# Patient Record
Sex: Male | Born: 1945 | Race: White | Hispanic: No | Marital: Married | Smoking: Never smoker
Health system: Southern US, Community
[De-identification: ages and names within clinical notes are randomized; demographics above are authoritative.]

## PROBLEM LIST (undated history)

## (undated) DIAGNOSIS — E119 Type 2 diabetes mellitus without complications: Secondary | ICD-10-CM

## (undated) DIAGNOSIS — A419 Sepsis, unspecified organism: Secondary | ICD-10-CM

## (undated) DIAGNOSIS — C801 Malignant (primary) neoplasm, unspecified: Secondary | ICD-10-CM

## (undated) DIAGNOSIS — Z8619 Personal history of other infectious and parasitic diseases: Secondary | ICD-10-CM

## (undated) DIAGNOSIS — M109 Gout, unspecified: Secondary | ICD-10-CM

## (undated) DIAGNOSIS — K219 Gastro-esophageal reflux disease without esophagitis: Secondary | ICD-10-CM

## (undated) DIAGNOSIS — I5032 Chronic diastolic (congestive) heart failure: Secondary | ICD-10-CM

## (undated) DIAGNOSIS — J342 Deviated nasal septum: Secondary | ICD-10-CM

## (undated) DIAGNOSIS — R7303 Prediabetes: Secondary | ICD-10-CM

## (undated) DIAGNOSIS — M9110 Juvenile osteochondrosis of head of femur [Legg-Calve-Perthes], unspecified leg: Secondary | ICD-10-CM

## (undated) DIAGNOSIS — E785 Hyperlipidemia, unspecified: Secondary | ICD-10-CM

## (undated) DIAGNOSIS — I10 Essential (primary) hypertension: Secondary | ICD-10-CM

## (undated) DIAGNOSIS — H6120 Impacted cerumen, unspecified ear: Secondary | ICD-10-CM

## (undated) HISTORY — PX: TONSILLECTOMY: SUR1361

## (undated) HISTORY — DX: Impacted cerumen, unspecified ear: H61.20

## (undated) HISTORY — PX: WISDOM TOOTH EXTRACTION: SHX21

## (undated) HISTORY — PX: HERNIA REPAIR: SHX51

## (undated) HISTORY — DX: Prediabetes: R73.03

## (undated) HISTORY — DX: Gout, unspecified: M10.9

## (undated) HISTORY — DX: Essential (primary) hypertension: I10

## (undated) HISTORY — DX: Deviated nasal septum: J34.2

## (undated) HISTORY — DX: Hyperlipidemia, unspecified: E78.5

## (undated) HISTORY — DX: Personal history of other infectious and parasitic diseases: Z86.19

## (undated) HISTORY — DX: Gastro-esophageal reflux disease without esophagitis: K21.9

## (undated) HISTORY — DX: Juvenile osteochondrosis of head of femur (Legg-Calve-Perthes), unspecified leg: M91.10

## (undated) HISTORY — DX: Chronic diastolic (congestive) heart failure: I50.32

## (undated) HISTORY — PX: VASECTOMY: SHX75

## (undated) HISTORY — DX: Type 2 diabetes mellitus without complications: E11.9

## (undated) HISTORY — DX: Sepsis, unspecified organism: A41.9

---

## 2004-12-19 ENCOUNTER — Ambulatory Visit: Payer: Self-pay | Admitting: Internal Medicine

## 2005-04-03 ENCOUNTER — Ambulatory Visit: Payer: Self-pay | Admitting: Internal Medicine

## 2005-04-07 ENCOUNTER — Ambulatory Visit: Payer: Self-pay | Admitting: Internal Medicine

## 2005-05-17 ENCOUNTER — Ambulatory Visit: Payer: Self-pay | Admitting: Internal Medicine

## 2005-06-08 ENCOUNTER — Encounter: Admission: RE | Admit: 2005-06-08 | Discharge: 2005-09-06 | Payer: Self-pay | Admitting: Internal Medicine

## 2006-01-19 ENCOUNTER — Ambulatory Visit: Payer: Self-pay | Admitting: Family Medicine

## 2007-01-28 ENCOUNTER — Ambulatory Visit: Payer: Self-pay | Admitting: Internal Medicine

## 2007-10-03 ENCOUNTER — Ambulatory Visit: Payer: Self-pay | Admitting: Internal Medicine

## 2007-10-04 ENCOUNTER — Ambulatory Visit: Payer: Self-pay | Admitting: Internal Medicine

## 2007-10-08 ENCOUNTER — Encounter (INDEPENDENT_AMBULATORY_CARE_PROVIDER_SITE_OTHER): Payer: Self-pay | Admitting: *Deleted

## 2007-12-09 ENCOUNTER — Telehealth (INDEPENDENT_AMBULATORY_CARE_PROVIDER_SITE_OTHER): Payer: Self-pay | Admitting: *Deleted

## 2007-12-09 DIAGNOSIS — R930 Abnormal findings on diagnostic imaging of skull and head, not elsewhere classified: Secondary | ICD-10-CM | POA: Insufficient documentation

## 2007-12-16 ENCOUNTER — Ambulatory Visit: Payer: Self-pay | Admitting: Cardiology

## 2007-12-18 ENCOUNTER — Encounter (INDEPENDENT_AMBULATORY_CARE_PROVIDER_SITE_OTHER): Payer: Self-pay | Admitting: *Deleted

## 2008-03-09 ENCOUNTER — Telehealth (INDEPENDENT_AMBULATORY_CARE_PROVIDER_SITE_OTHER): Payer: Self-pay | Admitting: *Deleted

## 2008-06-22 ENCOUNTER — Telehealth (INDEPENDENT_AMBULATORY_CARE_PROVIDER_SITE_OTHER): Payer: Self-pay | Admitting: *Deleted

## 2008-06-23 ENCOUNTER — Ambulatory Visit: Payer: Self-pay | Admitting: Internal Medicine

## 2008-06-23 DIAGNOSIS — I10 Essential (primary) hypertension: Secondary | ICD-10-CM

## 2008-06-23 HISTORY — DX: Essential (primary) hypertension: I10

## 2008-07-03 ENCOUNTER — Ambulatory Visit: Payer: Self-pay | Admitting: Internal Medicine

## 2008-07-14 ENCOUNTER — Telehealth (INDEPENDENT_AMBULATORY_CARE_PROVIDER_SITE_OTHER): Payer: Self-pay | Admitting: *Deleted

## 2008-07-14 LAB — CONVERTED CEMR LAB
Bilirubin, Direct: 0.1 mg/dL (ref 0.0–0.3)
CO2: 28 meq/L (ref 19–32)
Calcium: 9.4 mg/dL (ref 8.4–10.5)
Creatinine, Ser: 1 mg/dL (ref 0.4–1.5)
Direct LDL: 160 mg/dL
GFR calc non Af Amer: 80 mL/min
Glucose, Bld: 112 mg/dL — ABNORMAL HIGH (ref 70–99)
Hgb A1c MFr Bld: 6.1 % — ABNORMAL HIGH (ref 4.6–6.0)
TSH: 1.74 microintl units/mL (ref 0.35–5.50)
Triglycerides: 242 mg/dL (ref 0–149)

## 2009-03-16 ENCOUNTER — Telehealth (INDEPENDENT_AMBULATORY_CARE_PROVIDER_SITE_OTHER): Payer: Self-pay | Admitting: *Deleted

## 2009-03-17 ENCOUNTER — Ambulatory Visit: Payer: Self-pay | Admitting: Internal Medicine

## 2009-03-17 LAB — CONVERTED CEMR LAB
Albumin: 3.8 g/dL (ref 3.5–5.2)
Alkaline Phosphatase: 67 units/L (ref 39–117)
Cholesterol: 249 mg/dL — ABNORMAL HIGH (ref 0–200)
HDL: 39.3 mg/dL (ref >39.00)
Hgb A1c MFr Bld: 6.1 % (ref 4.6–6.5)
Total Bilirubin: 0.9 mg/dL (ref 0.3–1.2)
Total CHOL/HDL Ratio: 6
Total Protein: 7 g/dL (ref 6.0–8.3)
Triglycerides: 259 mg/dL — ABNORMAL HIGH (ref 0.0–149.0)

## 2009-03-22 ENCOUNTER — Ambulatory Visit: Payer: Self-pay | Admitting: Internal Medicine

## 2009-03-22 DIAGNOSIS — R7303 Prediabetes: Secondary | ICD-10-CM

## 2009-03-22 DIAGNOSIS — E1169 Type 2 diabetes mellitus with other specified complication: Secondary | ICD-10-CM | POA: Insufficient documentation

## 2009-03-22 HISTORY — DX: Prediabetes: R73.03

## 2009-03-23 ENCOUNTER — Encounter: Payer: Self-pay | Admitting: Internal Medicine

## 2009-07-15 ENCOUNTER — Ambulatory Visit: Payer: Self-pay | Admitting: Internal Medicine

## 2009-07-15 DIAGNOSIS — E785 Hyperlipidemia, unspecified: Secondary | ICD-10-CM | POA: Insufficient documentation

## 2009-07-15 DIAGNOSIS — M109 Gout, unspecified: Secondary | ICD-10-CM | POA: Insufficient documentation

## 2009-07-15 DIAGNOSIS — E1169 Type 2 diabetes mellitus with other specified complication: Secondary | ICD-10-CM

## 2009-07-15 HISTORY — DX: Type 2 diabetes mellitus with other specified complication: E11.69

## 2009-07-17 ENCOUNTER — Emergency Department (HOSPITAL_COMMUNITY): Admission: EM | Admit: 2009-07-17 | Discharge: 2009-07-17 | Payer: Self-pay | Admitting: Emergency Medicine

## 2009-07-17 ENCOUNTER — Emergency Department (HOSPITAL_COMMUNITY): Admission: EM | Admit: 2009-07-17 | Discharge: 2009-07-18 | Payer: Self-pay | Admitting: Emergency Medicine

## 2009-12-26 ENCOUNTER — Emergency Department (HOSPITAL_COMMUNITY): Admission: EM | Admit: 2009-12-26 | Discharge: 2009-12-26 | Payer: Self-pay | Admitting: Family Medicine

## 2010-01-04 ENCOUNTER — Ambulatory Visit: Payer: Self-pay | Admitting: Internal Medicine

## 2010-01-13 ENCOUNTER — Encounter: Payer: Self-pay | Admitting: Internal Medicine

## 2010-03-04 ENCOUNTER — Ambulatory Visit (HOSPITAL_COMMUNITY): Admission: RE | Admit: 2010-03-04 | Discharge: 2010-03-04 | Payer: Self-pay | Admitting: Surgery

## 2010-03-24 ENCOUNTER — Encounter: Payer: Self-pay | Admitting: Internal Medicine

## 2010-11-22 ENCOUNTER — Encounter: Payer: Self-pay | Admitting: Gastroenterology

## 2011-01-11 NOTE — Consult Note (Signed)
Summary: [facility]   Imported By: Lanelle Bal 02/17/2010 10:49:46  _____________________________________________________________________  External Attachment:    Type:   Image     Comment:   External Document

## 2011-01-11 NOTE — Letter (Signed)
Summary: [facility]   Imported By: Lanelle Bal 04/13/2010 10:23:04  _____________________________________________________________________  External Attachment:    Type:   Image     Comment:   External Document

## 2011-01-11 NOTE — Assessment & Plan Note (Signed)
Summary: DISCUSS HERNIA/KDC   Vital Signs:  Patient profile:   65 year old male Weight:      210.4 pounds BMI:     29.66 Temp:     98.6 degrees F oral Pulse rate:   80 / minute Resp:     15 per minute BP sitting:   128 / 76  (left arm) Cuff size:   large  Vitals Entered By: Shonna Chock (January 04, 2010 11:44 AM) CC: Discuss Hernia Flare-up, ? due to cough  Comments REVIEWED MED LIST, PATIENT AGREED DOSE AND INSTRUCTION CORRECT    CC:  Discuss Hernia Flare-up and ? due to cough .  History of Present Illness: Acute onset of L  inguinal pain 12/31/2009 after pushing tractor into shed. Pain improved w/o treatment but " movement" with cough. No sputum or fever  now ; S/P antibiotics from [facility]. He wants to pursue elvation "before [employer]"  Allergies (verified): No Known Drug Allergies  Review of Systems General:  Denies chills, fever, sweats, and weight loss. ENT:  Denies nasal congestion and sinus pressure; No purulence. Resp:  Complains of cough; denies shortness of breath, sputum productive, and wheezing. GI:  Denies abdominal pain, bloody stools, and dark tarry stools. GU:  Denies discharge, dysuria, and hematuria.  Physical Exam  General:  well-nourished,in no acute distress; alert,appropriate and cooperative throughout examination Abdomen:  Weakness R inguinal canal with cough. Reducible direct hernia on L. Genitalia:  Testes bilaterally descended without nodularity, tenderness or masses. Scrotal masses from vasectomy scar tissue. No penis lesions or urethral discharge. Skin:  Intact without suspicious lesions or rashes Inguinal Nodes:  No significant adenopathy   Impression & Recommendations:  Problem # 1:  INGUINAL HERNIA, LEFT (ICD-550.90)  Orders: Surgical Referral (Surgery)  Complete Medication List: 1)  Metoprolol Succinate 25 Mg Tb24 (Metoprolol succinate) .Marland Kitchen.. 1 by mouth qd 2)  Lisinopril 5 Mg Tabs (Lisinopril) .Marland Kitchen.. 1 by mouth once daily-needs  to schedule ov 3)  Aspirin Adult Low Strength 81 Mg Tbec (Aspirin) .Marland Kitchen.. 1 by mouth once daily 4)  Pepcid Complete 10-800-165 Mg Chew (Famotidine-ca carb-mag hydrox)  Patient Instructions: 1)  Nolifting > 15 # until seen

## 2011-01-12 NOTE — Letter (Signed)
Summary: Colonoscopy Letter  [facility]  [address]   [place], [place] [address]   Phone: [phone]  Fax: [phone]      November 22, 2010 MRN: [id number]   Devin Huffman [address] [place], [place]  [address]   Dear [name],   According to your medical record, it is time for you to schedule a Colonoscopy. The American Cancer Society recommends this procedure as a method to detect early colon cancer. Patients with a family history of colon cancer, or a personal history of colon polyps or inflammatory bowel disease are at increased risk.  This letter has been generated based on the recommendations made at the time of your procedure. If you feel that in your particular situation this may no longer apply, please contact our office.  Please call our office at [phone] to schedule this appointment or to update your records at your earliest convenience.  Thank you for cooperating with [place] to provide you with the very best care possible.   Sincerely,   Claudette Head, M.D.  [facility] [phone]

## 2011-01-24 ENCOUNTER — Encounter (INDEPENDENT_AMBULATORY_CARE_PROVIDER_SITE_OTHER): Payer: Commercial Managed Care - PPO | Admitting: Family Medicine

## 2011-01-24 ENCOUNTER — Encounter: Payer: Self-pay | Admitting: Family Medicine

## 2011-01-24 DIAGNOSIS — E782 Mixed hyperlipidemia: Secondary | ICD-10-CM

## 2011-01-24 DIAGNOSIS — I10 Essential (primary) hypertension: Secondary | ICD-10-CM

## 2011-01-24 DIAGNOSIS — R7309 Other abnormal glucose: Secondary | ICD-10-CM

## 2011-02-01 NOTE — Assessment & Plan Note (Signed)
Summary: new pt to est/cle   Vital Signs:  Patient profile:   65 year old male Height:      70.75 inches Weight:      214 pounds BMI:     30.17 Temp:     98.6 degrees F oral Pulse rate:   80 / minute Pulse rhythm:   regular BP sitting:   108 / 72  (left arm) Cuff size:   large  Vitals Entered By: Selena Batten Dance CMA Duncan Dull) (January 24, 2011 1:58 PM) CC: New patient to establish care/transfer from Dr. Alwyn Ren   History of Present Illness: CC: transfer from Dr. Alwyn Ren  Used to see Dr. Alwyn Ren, but now retired and lives nearby.  1. HTN - no HA, vision changes, chest pain, tightness, urinary changes, LE swelling.  Compliant with meds.  Did feel a bit dizzy this am.  BP today 108/72.  About to run out of meds.  2. GERD - controlling with pepcid.  tries to stay away from spicy/greasy foods.  h/o HLD in past.  not fasting today.  preventative: colonscopy - Dr. Russella Dar.  last done 2002, WNL.  due for rpt.  would like rpt colonsocopy, declines iFOB.  no blood in stool, no changes in BMs.  No family hx colon CA. prostate - last normal 2009.   tetanus 2010. no flu, no pneumonia.  Current Medications (verified): 1)  Metoprolol Succinate 25 Mg  Tb24 (Metoprolol Succinate) .Marland Kitchen.. 1 By Mouth Once Daily 2)  Lisinopril 5 Mg  Tabs (Lisinopril) .Marland Kitchen.. 1 By Mouth Once Daily 3)  Aspirin Adult Low Strength 81 Mg  Tbec (Aspirin) .Marland Kitchen.. 1 By Mouth Once Daily 4)  Pepcid Complete 10-800-165 Mg  Chew (Famotidine-Ca Carb-Mag Hydrox)  Allergies (verified): No Known Drug Allergies  Past History:  Social History: Last updated: 01/24/2011 No smoking, no EtOH, no rec drugs Caffeine: few cups/day No diet ; "I love my ice cream. 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 neg M aunt CA ? primary M uncle HTN P uncle CAD/MI niece (24yo) with 2 types of cancer  no FH DM or lipid disorder, CAD/MI, CVA  Social History: No smoking, no EtOH, no rec drugs Caffeine: few cups/day No diet ; "I love my ice cream. 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    Physical Exam  General:  well-nourished,in no acute distress; alert,appropriate and cooperative throughout examination Head:  Normocephalic and atraumatic without obvious abnormalities. No apparent alopecia or balding. Eyes:  PERRLA, EOMI, no injection Ears:  TMs clear bilaterally Nose:  nares clear bilaterally Mouth:  Oral mucosa and oropharynx without lesions or exudates.  Teeth in good repair. Neck:  No deformities,  masses, or tenderness noted.  no LAD, no carotid bruits Lungs:  Normal respiratory effort, chest expands symmetrically. Lungs are clear to auscultation, no crackles or wheezes. Heart:  Normal rate and regular rhythm. S1 and S2 normal without gallop, murmur, click, rub or other extra sounds. Abdomen:  Bowel sounds positive,abdomen soft and non-tender without masses, organomegaly or hernias noted. Msk:  No deformity or scoliosis noted of thoracic or lumbar  spine.   Pulses:  2+ rad pulses bilaterally Extremities:  no pedal edema Neurologic:  CN grossly intact, station and gait intact Skin:  Intact without suspicious lesions or rashes Psych:  memory intact for recent and remote, normally interactive, and good eye contact.     Impression & Recommendations:  Problem # 1:  HYPERTENSION (ICD-401.9) due for blood work.  on meds compliant, tolerating well.  bp a bit low, + dizziness this morning concerning for overtreating.  hold lisinopril for now, refilled metoprolol succinate.  return in next 1-2 wks for bp check.  if <120/80, stop lisinopril.  return fasting for blood work as due.  His updated medication list for this problem includes:    Metoprolol Succinate 25 Mg Tb24 (Metoprolol succinate) .Marland Kitchen... 1 by mouth once daily    Lisinopril 5 Mg Tabs (Lisinopril) .Marland Kitchen... 1 by mouth once daily  BP today: 108/72 Prior BP: 128/76 (01/04/2010)  Prior 10 Yr Risk Heart Disease: Not enough information (06/23/2008)  Labs Reviewed: K+: 4.4 (07/03/2008) Creat: : 1.0 (07/03/2008)   Chol: 249 (03/17/2009)   HDL: 39.30 (03/17/2009)   LDL: DEL (07/03/2008)   TG: 259.0 (03/17/2009)  Problem # 2:  HYPERLIPIDEMIA, MIXED (ICD-272.2) check CMP and FLP.  h/o mixed HLD, declined statin in past.  Labs Reviewed: SGOT: 18 (03/17/2009)   SGPT: 22 (03/17/2009)  Lipid Goals: Chol Goal: 200 (07/15/2009)   HDL Goal: 40 (07/15/2009)   LDL Goal: 130 (07/15/2009)   TG Goal: 150 (07/15/2009)  Prior 10 Yr Risk Heart Disease: Not enough information (06/23/2008)   HDL:39.30 (03/17/2009), 37.2 (07/03/2008)  LDL:DEL (07/03/2008)  Chol:249 (03/17/2009), 245 (07/03/2008)  Trig:259.0 (03/17/2009), 242 (07/03/2008)  Problem # 3:  FASTING HYPERGLYCEMIA (ICD-790.29)  check A1c.  previously 6.1%, prediabetes.  changed dx.  Labs Reviewed: Creat: 1.0 (07/03/2008)     Problem # 4:  HEALTH MAINTENANCE EXAM (ICD-V70.0) due for colonoscpy, declines iFOB, would like to pursue rpt  colonscopy.  advised call GI to schedule.  Reviewed preventive care protocols, scheduled due services, and updated immunizations.  UTD tetanus.  may need pneumonia/flu  Complete Medication List: 1)  Metoprolol Succinate 25 Mg Tb24 (Metoprolol succinate) .Marland Kitchen.. 1 by mouth once daily 2)  Lisinopril 5 Mg Tabs (Lisinopril) .Marland Kitchen.. 1 by mouth once daily 3)  Aspirin Adult Low Strength 81 Mg Tbec (Aspirin) .Marland Kitchen.. 1 by mouth once daily 4)  Pepcid Complete 10-800-165 Mg Chew (Famotidine-ca carb-mag hydrox)  Patient Instructions: 1)  Call Dr. Russella Dar to set up colonoscpy [phone]) 2)  Return fasting next 1-2 wks for blood work [CMP, FLP, A1c 401.9] 3)  Continue Metoprolol 25mg  daily. 4)  When you return for blood work, come in for nurse visit to check blood pressure.  If staying less than 120/80, we may stop lisinopril. 5)  Good to meet you today, call clinic with questions. Prescriptions: METOPROLOL SUCCINATE 25 MG  TB24 (METOPROLOL SUCCINATE) 1 by mouth once daily  #30 x 6   Entered and Authorized by:   Eustaquio Boyden  MD   Signed by:   Eustaquio Boyden  MD on  01/24/2011   Method used:   Electronically to        [facility] [address] (retail)       [address], [address]       [place], [place]  [address]       Ph: [phone]       Fax: [phone]   RxID:   [id number]    Orders Added: 1)  Est. Patient Level IV [84132]    Current Allergies (reviewed today): No known allergies    Prevention & Chronic Care Immunizations   Influenza vaccine: Not documented    Tetanus booster: 03/22/2009: Tdap   Tetanus booster due: 03/23/2019    Pneumococcal vaccine: Not documented    H. zoster vaccine: Not documented  Colorectal Screening   Hemoccult: Not documented    Colonoscopy: Not documented  Other Screening   PSA: 1.02  (07/03/2008)   Smoking status: never  (03/22/2009)  Lipids   Total Cholesterol: 249  (03/17/2009)   LDL: DEL  (07/03/2008)    LDL Direct: 141.9  (03/17/2009)   HDL: 39.30  (03/17/2009)   Triglycerides: 259.0  (03/17/2009)    SGOT (AST): 18  (03/17/2009)   SGPT (ALT): 22  (03/17/2009)   Alkaline phosphatase: 67  (03/17/2009)   Total bilirubin: 0.9  (03/17/2009)  Hypertension   Last Blood Pressure: 108 / 72  (01/24/2011)   Serum creatinine: 1.0  (07/03/2008)   Serum potassium 4.4  (07/03/2008)  Self-Management Support :    Hypertension self-management support: Not documented    Lipid self-management support: Not documented

## 2011-02-07 ENCOUNTER — Ambulatory Visit: Payer: Commercial Managed Care - PPO

## 2011-02-07 ENCOUNTER — Other Ambulatory Visit (INDEPENDENT_AMBULATORY_CARE_PROVIDER_SITE_OTHER): Payer: Commercial Managed Care - PPO

## 2011-02-07 ENCOUNTER — Other Ambulatory Visit: Payer: Self-pay | Admitting: Family Medicine

## 2011-02-07 ENCOUNTER — Encounter (INDEPENDENT_AMBULATORY_CARE_PROVIDER_SITE_OTHER): Payer: Self-pay | Admitting: *Deleted

## 2011-02-07 ENCOUNTER — Encounter: Payer: Self-pay | Admitting: Family Medicine

## 2011-02-07 DIAGNOSIS — E782 Mixed hyperlipidemia: Secondary | ICD-10-CM

## 2011-02-07 DIAGNOSIS — R7309 Other abnormal glucose: Secondary | ICD-10-CM

## 2011-02-07 DIAGNOSIS — I10 Essential (primary) hypertension: Secondary | ICD-10-CM

## 2011-02-07 DIAGNOSIS — E785 Hyperlipidemia, unspecified: Secondary | ICD-10-CM

## 2011-02-07 LAB — LIPID PANEL
HDL: 47.2 mg/dL (ref >39.00)
Triglycerides: 541 mg/dL — ABNORMAL HIGH (ref 0.0–149.0)
VLDL: 108.2 mg/dL — ABNORMAL HIGH (ref 0.0–40.0)

## 2011-02-07 LAB — BASIC METABOLIC PANEL
CO2: 27 mEq/L (ref 19–32)
Chloride: 102 mEq/L (ref 96–112)
GFR: 69.65 mL/min (ref >60.00)

## 2011-02-07 LAB — HEPATIC FUNCTION PANEL
ALT: 24 U/L (ref 0–53)
AST: 20 U/L (ref 0–37)
Albumin: 4.1 g/dL (ref 3.5–5.2)
Total Protein: 6.9 g/dL (ref 6.0–8.3)

## 2011-02-07 LAB — LDL CHOLESTEROL, DIRECT: Direct LDL: 135.8 mg/dL

## 2011-02-16 NOTE — Assessment & Plan Note (Signed)
Summary: BP CHECK / LFW  Nurse Visit   Vital Signs:  Patient profile:   65 year old male Temp:     98.4 degrees F oral Pulse rate:   72 / minute Pulse rhythm:   regular BP sitting:   142 / 88  (left arm) Cuff size:   large  Vitals Entered By: Sydell Axon LPN (February 07, 2011 8:15 AM) CC: Here for BP check, was taken off of Lisinopril 2 weeks ago to see if BP would stay down. If patient needs to go back on Lisinopril he will need a new rx sent to [facility] hasn't been checking at home bp a bit elevated, restart lisinopril.  f/u with me in 3 months.  has tolerated well in past.  asked to keep eye on bp at home. Eustaquio Boyden  MD  February 07, 2011 8:28 AM    Allergies: No Known Drug Allergies Prescriptions: LISINOPRIL 5 MG  TABS (LISINOPRIL) 1 by mouth once daily  #30 x 6   Entered and Authorized by:   Eustaquio Boyden  MD   Signed by:   Eustaquio Boyden  MD on 02/07/2011   Method used:   Electronically to        [facility] [address] (retail)       [address], [address]       [place], [place]  [address]       Ph: [phone]       Fax: [phone]   RxID:   [id number]

## 2011-02-26 LAB — CBC
HCT: 40.8 % (ref 39.0–52.0)
MCV: 89.8 fL (ref 78.0–100.0)
Platelets: 234 10*3/uL (ref 150–400)
RDW: 15.5 % (ref 11.5–15.5)
WBC: 7.1 10*3/uL (ref 4.0–10.5)

## 2011-02-26 LAB — DIFFERENTIAL
Lymphs Abs: 0.6 10*3/uL — ABNORMAL LOW (ref 0.7–4.0)
Monocytes Absolute: 0.8 10*3/uL (ref 0.1–1.0)

## 2011-03-06 LAB — CBC
MCV: 91.9 fL (ref 78.0–100.0)
Platelets: 241 10*3/uL (ref 150–400)
RDW: 14.9 % (ref 11.5–15.5)

## 2011-03-06 LAB — DIFFERENTIAL
Eosinophils Absolute: 0.1 10*3/uL (ref 0.0–0.7)
Eosinophils Relative: 2 % (ref 0–5)
Lymphocytes Relative: 29 % (ref 12–46)
Monocytes Absolute: 0.7 10*3/uL (ref 0.1–1.0)
Monocytes Relative: 11 % (ref 3–12)

## 2011-03-06 LAB — BASIC METABOLIC PANEL
CO2: 31 mEq/L (ref 19–32)
Chloride: 105 mEq/L (ref 96–112)
GFR calc Af Amer: >60 mL/min (ref >60)
GFR calc non Af Amer: >60 mL/min (ref >60)
Glucose, Bld: 116 mg/dL — ABNORMAL HIGH (ref 70–99)
Sodium: 141 mEq/L (ref 135–145)

## 2011-03-12 HISTORY — PX: COLONOSCOPY: SHX174

## 2011-03-19 LAB — POCT I-STAT, CHEM 8: BUN: 22 mg/dL (ref 6–23)

## 2011-03-24 ENCOUNTER — Ambulatory Visit (AMBULATORY_SURGERY_CENTER): Payer: 59 | Admitting: *Deleted

## 2011-03-24 VITALS — Ht 72.0 in | Wt 213.6 lb

## 2011-03-24 DIAGNOSIS — Z1211 Encounter for screening for malignant neoplasm of colon: Secondary | ICD-10-CM

## 2011-03-24 MED ORDER — PEG-KCL-NACL-NASULF-NA ASC-C 100 G PO SOLR: 1.0000 | Freq: Once | ORAL | Status: AC

## 2011-04-05 ENCOUNTER — Encounter: Payer: Self-pay | Admitting: Family Medicine

## 2011-04-07 ENCOUNTER — Ambulatory Visit (AMBULATORY_SURGERY_CENTER): Payer: 59 | Admitting: Gastroenterology

## 2011-04-07 ENCOUNTER — Encounter: Payer: Self-pay | Admitting: Gastroenterology

## 2011-04-07 DIAGNOSIS — D126 Benign neoplasm of colon, unspecified: Secondary | ICD-10-CM

## 2011-04-07 DIAGNOSIS — Z1211 Encounter for screening for malignant neoplasm of colon: Secondary | ICD-10-CM

## 2011-04-07 MED ORDER — SODIUM CHLORIDE 0.9 % IV SOLN: 500.0000 mL | INTRAVENOUS | Status: DC

## 2011-04-07 NOTE — Patient Instructions (Signed)
Please refer to green and blue instruction sheets.

## 2011-04-08 ENCOUNTER — Encounter: Payer: Self-pay | Admitting: Family Medicine

## 2011-04-10 ENCOUNTER — Telehealth: Payer: Self-pay | Admitting: *Deleted

## 2011-04-10 NOTE — Telephone Encounter (Signed)
No ID on Voice mail.  No message left at this time.

## 2011-04-11 ENCOUNTER — Encounter: Payer: Self-pay | Admitting: Gastroenterology

## 2011-04-25 NOTE — Assessment & Plan Note (Signed)
[facility]                                 ON-CALL NOTE   CONNAR, KEATING                       MRN:          [id number]  DATE:07/17/2009                            DOB:          [birth year]    DATE AND TIME:  July 17, 2009, at 8:19 p.m.   CALLER:  Wife.   PHONE NUMBER:  [phone].   REGULAR DOCTORS:  Dr. Alwyn Ren   CHIEF COMPLAINT:  Very weak and feels bad.   The patient's wife says he went to the doctor on Thursday.  He was given  colchicine for gout.  He has been taking it regularly until today and  then developed a fairly moderate diarrhea.  He has had about 6 bowel  movements today.  For the second half of the day, today, he is feeling  extremely weak and feels bad.  He said he is too weak to go outside to  get the mail order check on his garden.  His wife said he is somewhat  pale and clammy feeling.  He denies abdominal pain, but is still having  diarrhea, although he did stop the colchicine.  Denies fever, chills,  abdominal pain or other symptoms.  I advised her that I do not know what  could be causing him to feel so sick and much he is getting mildly  dehydrated from the diarrhea.  I advised her to take him on into either  urgent care or emergency room now for evaluation and that is what she is  going to do.     Marne A. Tower, MD  Electronically Signed    MAT/MedQ  DD: 07/17/2009  DT: 07/17/2009  Job #: [id number]

## 2011-04-28 NOTE — Assessment & Plan Note (Signed)
[facility]                        [place] OFFICE NOTE   LAKER, THOMPSON                       MRN:          [id number]  DATE:01/28/2007                            DOB:          1946-09-04    CHIEF COMPLAINT:  Devin Huffman is  complaining of pain in the area of the  Achilles tendon which began approximately five days prior.   He questions whether it may be related to kicking a hitch of a piece of  equipment to center it two weeks prior.   The evening of February 13, he had no symptoms, but awoke early in the  morning on February 14 to go to the bathroom and experienced acute pain  with the first step. On the 14th and 15th, he hobbled at work. He  ambulated with the help of crutches from a neighbor. Over February 16  and 17, there was some improvement using the crutches, but he has not  employed any other therapy.   PHYSICAL EXAMINATION:  Homan's sign was negative. There was no  tenderness to palpation of the Achilles tendon. He has edema to the  right medial malleolar area. The pain was worse at the posterior heel  with flexion. He had slight pain with inversion or eversion of the foot.  Fungal nail changes were noted.   He was given samples of Celebrex 200 mg twice a day and recommended he  stay off the foot with it elevated. Orthopaedic consult will  be  pursued; soft-tissue injury is suspected.   Additionally, he requested a refill on his blood pressure medicines.  Blood pressure is well-controlled at 120/74 and the lisinopril 5 mg and  metoprolol 25 mg were refilled for a year.   I mentioned that he has not followed up on his lipids as was recommended  and that he has significant cardiovascular risk. Additionally, his  hemoglobin A1c had begun to climb & his triglycerides were 221 and HDL  of 27 on May 17, 2005 suggesting possible prediabetes. Fasting labs have  been encouraged including hemoglobin A1c and a lipid profile. His LDL  should be less than 100.     Titus Dubin. Alwyn Ren, MD,FACP,FCCP  Electronically Signed    [facility]/MedQ  DD: 01/29/2007  DT: 01/29/2007  Job #: [id number]

## 2011-05-09 ENCOUNTER — Ambulatory Visit: Payer: Commercial Managed Care - PPO | Admitting: Family Medicine

## 2011-09-12 ENCOUNTER — Other Ambulatory Visit: Payer: Self-pay | Admitting: *Deleted

## 2011-09-12 MED ORDER — LISINOPRIL 5 MG PO TABS: 5.0000 mg | ORAL_TABLET | Freq: Every day | ORAL | Status: DC

## 2011-09-12 NOTE — Telephone Encounter (Signed)
OV 01/24/2011 as a new patient, nurse visit 02/07/2011 to check bp.  Should patient continue Lisinopril?

## 2011-09-12 NOTE — Telephone Encounter (Signed)
Will refill x3, would like pt to come in for BP f/u.

## 2011-09-13 NOTE — Telephone Encounter (Signed)
Notified patient and appt scheduled

## 2011-09-24 ENCOUNTER — Other Ambulatory Visit: Payer: Self-pay | Admitting: Family Medicine

## 2011-09-24 DIAGNOSIS — I10 Essential (primary) hypertension: Secondary | ICD-10-CM

## 2011-09-24 DIAGNOSIS — Z0000 Encounter for general adult medical examination without abnormal findings: Secondary | ICD-10-CM

## 2011-09-24 DIAGNOSIS — R7309 Other abnormal glucose: Secondary | ICD-10-CM

## 2011-09-25 ENCOUNTER — Other Ambulatory Visit (INDEPENDENT_AMBULATORY_CARE_PROVIDER_SITE_OTHER): Payer: 59

## 2011-09-25 DIAGNOSIS — I10 Essential (primary) hypertension: Secondary | ICD-10-CM

## 2011-09-25 DIAGNOSIS — Z0000 Encounter for general adult medical examination without abnormal findings: Secondary | ICD-10-CM

## 2011-09-25 DIAGNOSIS — R7309 Other abnormal glucose: Secondary | ICD-10-CM

## 2011-09-25 LAB — BASIC METABOLIC PANEL
BUN: 17 mg/dL (ref 6–23)
CO2: 28 mEq/L (ref 19–32)
Calcium: 9 mg/dL (ref 8.4–10.5)
Glucose, Bld: 105 mg/dL — ABNORMAL HIGH (ref 70–99)
Sodium: 140 mEq/L (ref 135–145)

## 2011-09-25 LAB — LIPID PANEL
Cholesterol: 237 mg/dL — ABNORMAL HIGH (ref 0–200)
Triglycerides: 374 mg/dL — ABNORMAL HIGH (ref 0.0–149.0)

## 2011-10-02 ENCOUNTER — Ambulatory Visit (INDEPENDENT_AMBULATORY_CARE_PROVIDER_SITE_OTHER): Payer: 59 | Admitting: Family Medicine

## 2011-10-02 ENCOUNTER — Encounter: Payer: Self-pay | Admitting: Family Medicine

## 2011-10-02 VITALS — BP 138/80 | HR 72 | Temp 98.3°F | Wt 216.0 lb

## 2011-10-02 DIAGNOSIS — E782 Mixed hyperlipidemia: Secondary | ICD-10-CM

## 2011-10-02 DIAGNOSIS — I10 Essential (primary) hypertension: Secondary | ICD-10-CM

## 2011-10-02 DIAGNOSIS — Z0000 Encounter for general adult medical examination without abnormal findings: Secondary | ICD-10-CM | POA: Insufficient documentation

## 2011-10-02 DIAGNOSIS — K219 Gastro-esophageal reflux disease without esophagitis: Secondary | ICD-10-CM

## 2011-10-02 MED ORDER — LISINOPRIL 5 MG PO TABS: 5.0000 mg | ORAL_TABLET | Freq: Every day | ORAL | Status: DC

## 2011-10-02 MED ORDER — METOPROLOL SUCCINATE ER 25 MG PO TB24: 25.0000 mg | ORAL_TABLET | Freq: Every day | ORAL | Status: DC

## 2011-10-02 NOTE — Patient Instructions (Addendum)
Trial of omprazole (prilosec OTC) for reflux.  Samples provided today. Return in 1 year or as needed. We will check prostate next visit. Refilled blood pressure meds. Low cholesterol diet provided.  Look into mediterranean diet. Good to see you today, call us with questions.

## 2011-10-02 NOTE — Assessment & Plan Note (Signed)
Chronic. Controlled on meds.  Ccntinue.  Refilled today.

## 2011-10-02 NOTE — Progress Notes (Signed)
Subjective:    Patient ID: Devin Huffman, male    DOB: 1946-09-29, 65 y.o.   MRN: [id number]  HPI CC: CPE today.  Hot flashes - last 6 months.  No skin/hair changes, diarrhea, constipation.  No NS.  No fevers/chills.  No weight changes.   Wt Readings from Last 3 Encounters:  10/02/11 216 lb (97.977 kg)  04/07/11 213 lb (96.616 kg)  03/24/11 213 lb 9.6 oz (96.888 kg)   GERD - notices worse with tea, but sometimes happens regardless of what he eats/drinks.  pepcid somewhat controlling sxs.  Wonders about stronger medicine.  No early satiety.  Does drink significant amt sweet tea daily.  preventative:  colonscopy - Dr. Russella Dar.  03/2011.  2 polyps - tubular adenoma, rpt 5 yrs.  No family hx colon CA.  prostate - last normal 2009.  Declines check today.  Would like to be checked next visit.  Mild nocturia, strong stream. Tetanus 2010. Denies flu, denies pneumonia, denies shingles. rec schedule appt for vision screen.  Medications and allergies reviewed and updated in chart.  Past histories reviewed and updated if relevant as below. Patient Active Problem List  Diagnoses  . HYPERLIPIDEMIA, MIXED  . GOUT  . HYPERTENSION  . Nonspecific (abnormal) findings on radiological and other examination of body structure  . NONSPECIFIC ABNORM FIND RAD&OTH EXAM LUNG FIELD  . PREDIABETES   Past Medical History  Diagnosis Date  . Hypertension   . Hyperlipidemia   . GERD (gastroesophageal reflux disease)   . Legg-Perthes disease     Right hip  . Fasting hyperglycemia     A1c 6.1%  . History of chicken pox    Past Surgical History  Procedure Date  . Hernia repair     double hernia  . Tonsillectomy   . Wisdom tooth extraction   . Vasectomy   . Colonoscopy 2002    Negative  . Colonoscopy 03/2011    2 polyps, awaiting path   History  Substance Use Topics  . Smoking status: Never Smoker   . Smokeless tobacco: Never Used  . Alcohol Use: No   Family History  Problem Relation Age of  Onset  . Breast cancer Mother     with recurrence  . Alzheimer's disease Father   . Healthy Sister   . Cancer Maternal Aunt     ?  Marland Kitchen Hypertension Maternal Uncle   . Coronary artery disease Paternal Uncle     MI  . Cancer Other     niece-2 types   No Known Allergies Current Outpatient Prescriptions on File Prior to Visit  Medication Sig Dispense Refill  . aspirin 81 MG tablet Take 81 mg by mouth daily.        . famotidine-calcium carbonate-magnesium hydroxide (PEPCID COMPLETE) 10-800-165 MG CHEW Chew 1 tablet by mouth daily as needed.        Marland Kitchen lisinopril (PRINIVIL,ZESTRIL) 5 MG tablet Take 1 tablet (5 mg total) by mouth daily.  30 tablet  3  . metoprolol succinate (TOPROL-XL) 25 MG 24 hr tablet Take 25 mg by mouth daily.         Current Facility-Administered Medications on File Prior to Visit  Medication Dose Route Frequency Provider Last Rate Last Dose  . DISCONTD: 0.9 %  sodium chloride infusion  500 mL Intravenous Continuous Meryl Dare, MD,FACG       Review of Systems  Constitutional: Negative for fever, chills, activity change, appetite change, fatigue and unexpected weight change.  HENT:  Negative for hearing loss and neck pain.   Eyes: Positive for visual disturbance (some blurry, has eye doctor appt coming up).  Respiratory: Negative for cough, chest tightness, shortness of breath and wheezing.   Cardiovascular: Negative for chest pain, palpitations and leg swelling.  Gastrointestinal: Negative for nausea, vomiting, abdominal pain, diarrhea, constipation, blood in stool and abdominal distention.  Genitourinary: Negative for hematuria and difficulty urinating.  Musculoskeletal: Negative for myalgias and arthralgias.  Skin: Negative for rash.  Neurological: Negative for dizziness, seizures, syncope and headaches.  Hematological: Does not bruise/bleed easily.  Psychiatric/Behavioral: Negative for dysphoric mood. The patient is not nervous/anxious.       Objective:    Physical Exam  Nursing note and vitals reviewed. Constitutional: He is oriented to person, place, and time. He appears well-developed and well-nourished. No distress.  HENT:  Head: Normocephalic and atraumatic.  Right Ear: External ear normal.  Left Ear: External ear normal.  Nose: Nose normal.  Mouth/Throat: Oropharynx is clear and moist.  Eyes: Conjunctivae and EOM are normal. Pupils are equal, round, and reactive to light.  Neck: Normal range of motion. Neck supple. No thyromegaly present.  Cardiovascular: Normal rate, regular rhythm, normal heart sounds and intact distal pulses.   No murmur heard. Pulses:      Radial pulses are 2+ on the right side, and 2+ on the left side.  Pulmonary/Chest: Effort normal and breath sounds normal. No respiratory distress. He has no wheezes. He has no rales.  Abdominal: Soft. Bowel sounds are normal. He exhibits no distension and no mass. There is no tenderness. There is no rebound and no guarding.  Musculoskeletal: Normal range of motion.  Lymphadenopathy:    He has no cervical adenopathy.  Neurological: He is alert and oriented to person, place, and time.       CN grossly intact, station and gait intact  Skin: Skin is warm and dry. No rash noted.  Psychiatric: He has a normal mood and affect. His behavior is normal. Judgment and thought content normal.      Assessment & Plan:

## 2011-10-02 NOTE — Assessment & Plan Note (Signed)
Trial of omeprazole 

## 2011-10-02 NOTE — Assessment & Plan Note (Signed)
Preventative protocols reviewed and updated. Pt declines flu, shingles, pneumonia shots. Reviewed blood work in detail. rec set up vision screen. UTD colonoscopy - tubular adenoma x2, rpt 03/2016 Declines prostate check this year, requests for next year.

## 2011-10-02 NOTE — Assessment & Plan Note (Signed)
Reviewed blood work. Trig improved but still >300. Discussed decrease added sugars, eliminate trans fats, increase fiber and limit alcohol.  Encouraged increase fish intake. rtc 6 mo for recheck.

## 2012-02-22 ENCOUNTER — Encounter: Payer: Self-pay | Admitting: Family Medicine

## 2012-02-22 ENCOUNTER — Ambulatory Visit (INDEPENDENT_AMBULATORY_CARE_PROVIDER_SITE_OTHER): Payer: 59 | Admitting: Family Medicine

## 2012-02-22 VITALS — BP 122/78 | HR 84 | Temp 98.7°F | Wt 220.8 lb

## 2012-02-22 DIAGNOSIS — K130 Diseases of lips: Secondary | ICD-10-CM

## 2012-02-22 DIAGNOSIS — R220 Localized swelling, mass and lump, head: Secondary | ICD-10-CM | POA: Insufficient documentation

## 2012-02-22 MED ORDER — AMLODIPINE BESYLATE 5 MG PO TABS: 5.0000 mg | ORAL_TABLET | Freq: Every day | ORAL | Status: DC

## 2012-02-22 NOTE — Assessment & Plan Note (Signed)
Intermittent along with localized hives R buttock when this happens. On ACEI. Concern for angioedema, stop ACEI, start norvasc for blood pressure. Let us know if sxs recur.

## 2012-02-22 NOTE — Patient Instructions (Signed)
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  Most common medicine that causes this is lisinopril family so we will stop it. Start amlodipine 5mg  daily.  Common side effect includes ankle swelling.  Angioedema Angioedema (AE) is a sudden swelling of the eyelids, lips, lobes of ears, external genitalia, skin, and other parts of the body. AE can happen by itself. It usually begins during the night and is found on awakening. It can happen with hives and other allergic reactions. Attacks can be mild and annoying, or life-threatening if the air passages swell. AE generally occurs in a short time period (over minutes to hours) and gets better in 24 to 48 hours. It usually does not cause any serious problems.  There are 2 different kinds of AE:   Allergic AE.   Nonallergic AE.   There may be an overreaction or direct stimulation of cells that are a part of the immune system (mast cells).   There may be problems with the release of chemicals made by the body that cause swelling and inflammation (kinins). AE due to kinins can be inherited from parents (hereditary), or it can develop on its own (acquired). Acquired AE either shows up before, or along with, certain diseases or is due to the body's immune system attacking parts of the body's own cells (autoimmune).  CAUSES  Allergic  AE due to allergic reactions are caused by something that causes the body to react (trigger). Common triggers include:   Foods.   Medicines.   Latex.   Direct contact with certain fruits, vegetables, or animal saliva.   Insect stings.  Nonallergic  Mast cell stimulation may be caused by:   Medicines.   Dyes used in X-rays.   The body's own immune system reactions to parts of the body (autoimmune disease).   Possibly, some virus infections.   AE due to problems with kinins can be hereditary or acquired. Attacks are triggered by:   Mild injury.   Dental work or any surgery.   Stress.   Sudden  changes in temperature.   Exercise.   Medicines.   AE due to problems with kinins can also be due to certain medicines, especially blood pressure medicines like angiotensin-converting enzyme (ACE) inhibitors. African Americans are at nearly 5 times greater risk of developing AE than Caucasians from ACE inhibitors.  SYMPTOMS  Allergic symptoms:  Non-itchy swelling of the skin. Often the swelling is on the face and lips, but any area of the skin can swell. Sometimes, the swelling can be painful. If hives are present, there is intense itching.   Breathing problems if the air passages swell.  Nonallergic symptoms:  If internal organs are involved, there may be:   Nausea.   Abdominal pain.   Vomiting.   Difficulty swallowing.   Difficulty passing urine.   Breathing problems if the air passages swell.  Depending on the cause of AE, episodes may:  Only happen once (if triggers are removed or avoided).   Come back in unpredictable patterns.   Repeat for several years and then gradually fade away.  DIAGNOSIS  AE is diagnosed by:   Asking questions to find out how fast the symptoms began.   Taking a family history.   Physical exam.   Diagnostic tests. Tests could include:   Allergy skin tests to see if the problem is allergic.   Blood tests to diagnose hereditary and some acquired types of AE.   Other tests to see if there  is a hidden disease leading to the AE.  TREATMENT  Treatment depends on the type and cause (if any) of the AE. Allergic  Allergic types of AE are treated with:   Immediate removal of the trigger or medicine (if any).   Epinephrine injection.   Steroids.   Antihistamines.   Hospitalization for severe attacks.  Nonallergic  Mast cell stimulation types of AE are treated with:   Immediate removal of the trigger or medicine (if any).   Epinephrine injection.   Steroids.   Antihistamines.   Hospitalization for severe attacks.    Hereditary AE is treated with:   Medicines to prevent and treat attacks. There is little response to antihistamines, epinephrine, or steroids.   Preventive medicines before dental work or surgery.   Removing or avoiding medicines that trigger attacks.   Hospitalization for severe attacks.   Acquired AE is treated with:   Treating underlying disease (if any).   Medicines to prevent and treat attacks.  HOME CARE INSTRUCTIONS   Always carry your emergency allergy treatment medicines with you.   Wear a medical bracelet.   Avoid known triggers.  SEEK MEDICAL CARE IF:   You get repeat attacks.   Your attacks are more frequent or more severe despite preventive measures.   You have hereditary AE and are considering having children. It is important to discuss the risks of passing this on to your children.  SEEK IMMEDIATE MEDICAL CARE IF:   You have difficulty breathing.   You have difficulty swallowing.   You experience fainting.  This condition should be treated immediately. It can be life-threatening if it involves throat swelling. Document Released: 02/05/2002 Document Revised: 11/16/2011 Document Reviewed: 11/26/2008 [facility] Patient Information 2012 [place], [place].

## 2012-02-22 NOTE — Progress Notes (Signed)
  Subjective:    Patient ID: Devin Huffman, male    DOB: [birth year], 66 y.o.   MRN: [id number]  HPI CC: rash  A few weeks ago after breakfast upper lip swelled up and numb.  Next day resolved.  A few days later, 1/2 bottom of lip swelled.  One episode of mild throat welling (a few weeks ago).  Also noticing whelps right buttock when lip swelling as well as rash midline buttock.  Had itch anterior forearms, intermittent.  Currently no rash, lip swelling.  No tongue swelling.  No dry cough.  No oral lesions.  No fever/chills, n/v.  No new medicines.  No new foods.  No new lotions, detergents, soaps, shampoo.  Has been on lisinopril for several years.  Current Outpatient Prescriptions on File Prior to Visit  Medication Sig Dispense Refill  . aspirin 81 MG tablet Take 81 mg by mouth daily.        . famotidine-calcium carbonate-magnesium hydroxide (PEPCID COMPLETE) 10-800-165 MG CHEW Chew 1 tablet by mouth daily as needed.        Marland Kitchen lisinopril (PRINIVIL,ZESTRIL) 5 MG tablet Take 1 tablet (5 mg total) by mouth daily.  90 tablet  3  . metoprolol succinate (TOPROL-XL) 25 MG 24 hr tablet Take 1 tablet (25 mg total) by mouth daily.  90 tablet  3    Review of Systems Per HPI    Objective:   Physical Exam  Nursing note and vitals reviewed. Constitutional: He appears well-developed and well-nourished. No distress.  HENT:  Head: Normocephalic and atraumatic.  Mouth/Throat: Oropharynx is clear and moist. No oropharyngeal exudate.       No oral lesions  Eyes: Conjunctivae and EOM are normal. Pupils are equal, round, and reactive to light. No scleral icterus.  Neck: Normal range of motion. Neck supple.  Cardiovascular: Normal rate, regular rhythm, normal heart sounds and intact distal pulses.   No murmur heard. Pulmonary/Chest: Effort normal and breath sounds normal. No respiratory distress. He has no wheezes. He has no rales.  Musculoskeletal: He exhibits no edema.  Skin: Skin is warm and  dry. No rash noted.  Psychiatric: He has a normal mood and affect.       Assessment & Plan:

## 2012-02-29 ENCOUNTER — Other Ambulatory Visit: Payer: Self-pay

## 2012-02-29 MED ORDER — OMEPRAZOLE 20 MG PO CPDR: 20.0000 mg | DELAYED_RELEASE_CAPSULE | Freq: Every day | ORAL | Status: DC | PRN

## 2012-02-29 NOTE — Telephone Encounter (Signed)
Patient notified

## 2012-02-29 NOTE — Telephone Encounter (Signed)
plz notify sent in. 

## 2012-02-29 NOTE — Telephone Encounter (Signed)
Pt left v/m that Dr Sharen Hones is aware pt has been getting OTC Omeprazole 20 mg over the counter without rx, and due to cost pt would like rx sent to [facility] outpatient pharmacy for Omeprazole 20 mg OTC. Pt can be reached at [phone].Please advise.

## 2012-06-17 ENCOUNTER — Encounter: Payer: Self-pay | Admitting: Family Medicine

## 2012-06-20 ENCOUNTER — Encounter: Payer: Self-pay | Admitting: Family Medicine

## 2012-06-20 ENCOUNTER — Ambulatory Visit (INDEPENDENT_AMBULATORY_CARE_PROVIDER_SITE_OTHER): Payer: 59 | Admitting: Family Medicine

## 2012-06-20 VITALS — BP 140/80 | HR 68 | Temp 98.1°F | Ht 72.0 in | Wt 221.0 lb

## 2012-06-20 DIAGNOSIS — M19079 Primary osteoarthritis, unspecified ankle and foot: Secondary | ICD-10-CM

## 2012-06-20 LAB — CBC WITH DIFFERENTIAL/PLATELET
Basophils Absolute: 0 10*3/uL (ref 0.0–0.1)
Eosinophils Relative: 1.2 % (ref 0.0–5.0)
HCT: 40.9 % (ref 39.0–52.0)
Hemoglobin: 13.4 g/dL (ref 13.0–17.0)
Lymphs Abs: 2 10*3/uL (ref 0.7–4.0)
MCV: 88.1 fl (ref 78.0–100.0)
Monocytes Absolute: 0.8 10*3/uL (ref 0.1–1.0)
Monocytes Relative: 8.9 % (ref 3.0–12.0)
Neutro Abs: 6.3 10*3/uL (ref 1.4–7.7)
RDW: 16.8 % — ABNORMAL HIGH (ref 11.5–14.6)
WBC: 9.3 10*3/uL (ref 4.5–10.5)

## 2012-06-20 LAB — BASIC METABOLIC PANEL
BUN: 25 mg/dL — ABNORMAL HIGH (ref 6–23)
Chloride: 105 mEq/L (ref 96–112)
Potassium: 4 mEq/L (ref 3.5–5.1)
Sodium: 138 mEq/L (ref 135–145)

## 2012-06-20 LAB — URIC ACID: Uric Acid, Serum: 7 mg/dL (ref 4.0–7.8)

## 2012-06-20 MED ORDER — COLCHICINE 0.6 MG PO TABS: ORAL_TABLET | ORAL | Status: DC

## 2012-06-20 NOTE — Patient Instructions (Addendum)
It does seem like gout - treat with colchicine as discussed. Continue indocin 25mg  - may take 1-2 pills twice daily. Let us know if not better with this. No ice or heat to joint.  Elevate leg and rest foot as much as able. Gout Gout is an inflammatory condition (arthritis) caused by a buildup of uric acid crystals in the joints. Uric acid is a chemical that is normally present in the blood. Under some circumstances, uric acid can form into crystals in your joints. This causes joint redness, soreness, and swelling (inflammation). Repeat attacks are common. Over time, uric acid crystals can form into masses (tophi) near a joint, causing disfigurement. Gout is treatable and often preventable. CAUSES  The disease begins with elevated levels of uric acid in the blood. Uric acid is produced by your body when it breaks down a naturally found substance called purines. This also happens when you eat certain foods such as meats and fish. Causes of an elevated uric acid level include:  Being passed down from parent to child (heredity).   Diseases that cause increased uric acid production (obesity, psoriasis, some cancers).   Excessive alcohol use.   Diet, especially diets rich in meat and seafood.   Medicines, including certain cancer-fighting drugs (chemotherapy), diuretics, and aspirin.   Chronic kidney disease. The kidneys are no longer able to remove uric acid well.   Problems with metabolism.  Conditions strongly associated with gout include:  Obesity.   High blood pressure.   High cholesterol.   Diabetes.  Not everyone with elevated uric acid levels gets gout. It is not understood why some people get gout and others do not. Surgery, joint injury, and eating too much of certain foods are some of the factors that can lead to gout. SYMPTOMS   An attack of gout comes on quickly. It causes intense pain with redness, swelling, and warmth in a joint.   Fever can occur.   Often, only one  joint is involved. Certain joints are more commonly involved:   Base of the big toe.   Knee.   Ankle.   Wrist.   Finger.  Without treatment, an attack usually goes away in a few days to weeks. Between attacks, you usually will not have symptoms, which is different from many other forms of arthritis. DIAGNOSIS  Your caregiver will suspect gout based on your symptoms and exam. Removal of fluid from the joint (arthrocentesis) is done to check for uric acid crystals. Your caregiver will give you a medicine that numbs the area (local anesthetic) and use a needle to remove joint fluid for exam. Gout is confirmed when uric acid crystals are seen in joint fluid, using a special microscope. Sometimes, blood, urine, and X-ray tests are also used. TREATMENT  There are 2 phases to gout treatment: treating the sudden onset (acute) attack and preventing attacks (prophylaxis). Treatment of an Acute Attack  Medicines are used. These include anti-inflammatory medicines or steroid medicines.   An injection of steroid medicine into the affected joint is sometimes necessary.   The painful joint is rested. Movement can worsen the arthritis.   You may use warm or cold treatments on painful joints, depending which works best for you.   Discuss the use of coffee, vitamin C, or cherries with your caregiver. These may be helpful treatment options.  Treatment to Prevent Attacks After the acute attack subsides, your caregiver may advise prophylactic medicine. These medicines either help your kidneys eliminate uric acid from your body  or decrease your uric acid production. You may need to stay on these medicines for a very long time. The early phase of treatment with prophylactic medicine can be associated with an increase in acute gout attacks. For this reason, during the first few months of treatment, your caregiver may also advise you to take medicines usually used for acute gout treatment. Be sure you understand  your caregiver's directions. You should also discuss dietary treatment with your caregiver. Certain foods such as meats and fish can increase uric acid levels. Other foods such as dairy can decrease levels. Your caregiver can give you a list of foods to avoid. HOME CARE INSTRUCTIONS   Do not take aspirin to relieve pain. This raises uric acid levels.   Only take over-the-counter or prescription medicines for pain, discomfort, or fever as directed by your caregiver.   Rest the joint as much as possible. When in bed, keep sheets and blankets off painful areas.   Keep the affected joint raised (elevated).   Use crutches if the painful joint is in your leg.   Drink enough water and fluids to keep your urine clear or pale yellow. This helps your body get rid of uric acid. Do not drink alcoholic beverages. They slow the passage of uric acid.   Follow your caregiver's dietary instructions. Pay careful attention to the amount of protein you eat. Your daily diet should emphasize fruits, vegetables, whole grains, and fat-free or low-fat milk products.   Maintain a healthy body weight.  SEEK MEDICAL CARE IF:   You have an oral temperature above 102 F (38.9 C).   You develop diarrhea, vomiting, or any side effects from medicines.   You do not feel better in 24 hours, or you are getting worse.  SEEK IMMEDIATE MEDICAL CARE IF:   Your joint becomes suddenly more tender and you have:   Chills.   An oral temperature above 102 F (38.9 C), not controlled by medicine.  MAKE SURE YOU:   Understand these instructions.   Will watch your condition.   Will get help right away if you are not doing well or get worse.  Document Released: 11/24/2000 Document Revised: 11/16/2011 Document Reviewed: 03/07/2010 [facility] Patient Information 2012 [place], [place].

## 2012-06-20 NOTE — Progress Notes (Signed)
Subjective:    Patient ID: Devin Huffman, male    DOB: 1946/01/18, 66 y.o.   MRN: [id number]  HPI CC: L great toe pain  2wk h/o left MTP joint swelling, redness, pain.  Saw Dr Bryson Ha who prescribed prednisone taper (starting at 30mg  daily), and started indocin at 25mg  bid.  Actually worsened 5d later after mowed yard.    Denies inciting trauma, injury, or recent increase in organ meats, seafood.  Avoids EtOH.  No fevers/chills, other joint pains, skin rashes.  No h/o gout in past.  Lab Results  Component Value Date   CREATININE 1.1 09/25/2011    Medications and allergies reviewed and updated in chart.  Past histories reviewed and updated if relevant as below. Patient Active Problem List  Diagnosis  . HYPERLIPIDEMIA, MIXED  . GOUT  . HYPERTENSION  . Nonspecific (abnormal) findings on radiological and other examination of body structure  . NONSPECIFIC ABNORM FIND RAD&OTH EXAM LUNG FIELD  . PREDIABETES  . Healthcare maintenance  . GERD (gastroesophageal reflux disease)  . Lip swelling   Past Medical History  Diagnosis Date  . Hypertension   . Hyperlipidemia   . GERD (gastroesophageal reflux disease)   . Legg-Perthes disease     Right hip  . Fasting hyperglycemia     A1c 6.1%  . History of chicken pox   . Gout     ?due to L great toe pain, saw Dr Darrelyn Hillock   Past Surgical History  Procedure Date  . Hernia repair     double hernia  . Tonsillectomy   . Wisdom tooth extraction   . Vasectomy   . Colonoscopy 2002    Negative  . Colonoscopy 03/2011    2 tubular adenomas, rec rpt 5 yrs   History  Substance Use Topics  . Smoking status: Never Smoker   . Smokeless tobacco: Never Used  . Alcohol Use: No   Family History  Problem Relation Age of Onset  . Breast cancer Mother     with recurrence  . Alzheimer's disease Father   . Healthy Sister   . Cancer Maternal Aunt     ?  Marland Kitchen Hypertension Maternal Uncle   . Stroke Maternal Uncle   . Coronary artery disease  Paternal Uncle     MI  . Cancer Other     niece-2 types  . Stroke Maternal Grandfather    Allergies  Allergen Reactions  . Lisinopril Swelling    ?angioedema - lip swelling, hives   Current Outpatient Prescriptions on File Prior to Visit  Medication Sig Dispense Refill  . amLODipine (NORVASC) 5 MG tablet Take 1 tablet (5 mg total) by mouth daily.  90 tablet  3  . aspirin 81 MG tablet Take 81 mg by mouth daily.        . metoprolol succinate (TOPROL-XL) 25 MG 24 hr tablet Take 1 tablet (25 mg total) by mouth daily.  90 tablet  3  . omeprazole (PRILOSEC) 20 MG capsule Take 1 capsule (20 mg total) by mouth daily as needed.  30 capsule  11  . famotidine-calcium carbonate-magnesium hydroxide (PEPCID COMPLETE) 10-800-165 MG CHEW Chew 1 tablet by mouth daily as needed.        Marland Kitchen DISCONTD: lisinopril (PRINIVIL,ZESTRIL) 5 MG tablet Take 1 tablet (5 mg total) by mouth daily.  90 tablet  3      Review of Systems Per HPI    Objective:   Physical Exam  Nursing note and vitals reviewed. Constitutional:  He appears well-developed and well-nourished. No distress.  Cardiovascular:  Pulses:      Dorsalis pedis pulses are 2+ on the left side.  Musculoskeletal: He exhibits no edema.       Left ankle FROM, no pain Left 1st MTP erythematous laterally, mild swelling and warmth.  Tender to palpation lateral MTP.  FROM at joint - able to flex/extend toe without significant pain 1st toenail onychomycosis No interdigital maceration       Assessment & Plan:

## 2012-06-20 NOTE — Assessment & Plan Note (Signed)
Does have h/o gout in chart, however pt denies h/o this.   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  Already completed steroid course.  Will recommend continue indocin, and start colchicine. Blood work today - CBC, BMP, uric acid.  Doubt infectious. Update me if sxs not improved after this, consider rpt steroid course vs further eval (ie referral to ortho for aspiration)

## 2012-08-29 ENCOUNTER — Encounter: Payer: Self-pay | Admitting: Gastroenterology

## 2012-10-07 ENCOUNTER — Other Ambulatory Visit: Payer: Self-pay | Admitting: Family Medicine

## 2012-10-07 ENCOUNTER — Other Ambulatory Visit: Payer: Self-pay

## 2012-10-07 DIAGNOSIS — I10 Essential (primary) hypertension: Secondary | ICD-10-CM

## 2012-10-07 DIAGNOSIS — Z0000 Encounter for general adult medical examination without abnormal findings: Secondary | ICD-10-CM

## 2012-10-07 DIAGNOSIS — R7309 Other abnormal glucose: Secondary | ICD-10-CM

## 2012-10-07 DIAGNOSIS — R930 Abnormal findings on diagnostic imaging of skull and head, not elsewhere classified: Secondary | ICD-10-CM

## 2012-10-07 DIAGNOSIS — Z125 Encounter for screening for malignant neoplasm of prostate: Secondary | ICD-10-CM

## 2012-10-07 DIAGNOSIS — E782 Mixed hyperlipidemia: Secondary | ICD-10-CM

## 2012-10-07 NOTE — Telephone Encounter (Signed)
Pt request status of metoprolol refill; advised pt refill was done # 30 but needs to schedule CPX. Carrie scheduling appt.Marland Kitchen

## 2012-10-08 ENCOUNTER — Other Ambulatory Visit (INDEPENDENT_AMBULATORY_CARE_PROVIDER_SITE_OTHER): Payer: 59

## 2012-10-08 DIAGNOSIS — R7309 Other abnormal glucose: Secondary | ICD-10-CM

## 2012-10-08 DIAGNOSIS — E782 Mixed hyperlipidemia: Secondary | ICD-10-CM

## 2012-10-08 DIAGNOSIS — I10 Essential (primary) hypertension: Secondary | ICD-10-CM

## 2012-10-08 DIAGNOSIS — Z125 Encounter for screening for malignant neoplasm of prostate: Secondary | ICD-10-CM

## 2012-10-08 LAB — BASIC METABOLIC PANEL
CO2: 29 mEq/L (ref 19–32)
Calcium: 9 mg/dL (ref 8.4–10.5)
GFR: 78.99 mL/min (ref >60.00)
Glucose, Bld: 109 mg/dL — ABNORMAL HIGH (ref 70–99)
Potassium: 4.9 mEq/L (ref 3.5–5.1)
Sodium: 140 mEq/L (ref 135–145)

## 2012-10-08 LAB — LIPID PANEL
HDL: 39.1 mg/dL (ref >39.00)
Total CHOL/HDL Ratio: 6
Triglycerides: 208 mg/dL — ABNORMAL HIGH (ref 0.0–149.0)

## 2012-10-08 LAB — LDL CHOLESTEROL, DIRECT: Direct LDL: 139 mg/dL

## 2012-10-08 LAB — HEMOGLOBIN A1C: Hgb A1c MFr Bld: 6.2 % (ref 4.6–6.5)

## 2012-10-10 ENCOUNTER — Encounter: Payer: Medicare Other | Admitting: Family Medicine

## 2012-10-15 ENCOUNTER — Encounter: Payer: Self-pay | Admitting: Family Medicine

## 2012-10-15 ENCOUNTER — Ambulatory Visit (INDEPENDENT_AMBULATORY_CARE_PROVIDER_SITE_OTHER): Payer: 59 | Admitting: Family Medicine

## 2012-10-15 VITALS — BP 122/78 | HR 80 | Temp 98.2°F | Ht 72.0 in | Wt 219.8 lb

## 2012-10-15 DIAGNOSIS — E538 Deficiency of other specified B group vitamins: Secondary | ICD-10-CM | POA: Insufficient documentation

## 2012-10-15 DIAGNOSIS — Z23 Encounter for immunization: Secondary | ICD-10-CM

## 2012-10-15 DIAGNOSIS — E782 Mixed hyperlipidemia: Secondary | ICD-10-CM

## 2012-10-15 DIAGNOSIS — I10 Essential (primary) hypertension: Secondary | ICD-10-CM

## 2012-10-15 DIAGNOSIS — R209 Unspecified disturbances of skin sensation: Secondary | ICD-10-CM

## 2012-10-15 DIAGNOSIS — R7309 Other abnormal glucose: Secondary | ICD-10-CM

## 2012-10-15 DIAGNOSIS — Z0000 Encounter for general adult medical examination without abnormal findings: Secondary | ICD-10-CM

## 2012-10-15 MED ORDER — METOPROLOL SUCCINATE ER 25 MG PO TB24: 25.0000 mg | ORAL_TABLET | Freq: Every day | ORAL | Status: DC

## 2012-10-15 MED ORDER — AMLODIPINE BESYLATE 5 MG PO TABS: 5.0000 mg | ORAL_TABLET | Freq: Every day | ORAL | Status: DC

## 2012-10-15 NOTE — Progress Notes (Addendum)
Subjective:    Patient ID: Devin Huffman, male    DOB: 1946-04-30, 66 y.o.   MRN: [id number]  HPI CC: medicare wellness visit  1-2 yr h/o altered sensation bilateral soles.  "feels like walking on bubble wrap".  No pain, no numbness.  No EtOH.  Had 1 fall 2 wks ago, did not injure himself.  Fall when trying to take branch off tree.  No other falls since then. Denies depression, anxiety, anhedonia.  Passes hearing and vision screen today.  preventative:  colonscopy - Dr. Russella Dar. 03/2011. 2 polyps - tubular adenoma, rpt 5 yrs. No family hx colon CA. prostate - last normal 2009. Would like to be checked next visit.  Mild nocturia, strong stream. Tetanus 2010. Pneumovax today.  Flu shot today. Denies shingles. Advanced directives: does not want prolonged life support.  Would want for reversible cause.  Wife would be HCPOA.  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 Activity: works outside.  No regular exercise. Diet: "I love my ice cream. I can't live forever", some water   Wt Readings from Last 3 Encounters:  10/15/12 219 lb 12 oz (99.678 kg)  06/20/12 221 lb (100.245 kg)  02/22/12 220 lb 12 oz (100.132 kg)   Medications and allergies reviewed and updated in chart.  Past histories reviewed and updated if relevant as below. Patient Active Problem List  Diagnosis  . HYPERLIPIDEMIA, MIXED  . GOUT  . HYPERTENSION  . Nonspecific (abnormal) findings on radiological and other examination of body structure  . NONSPECIFIC ABNORM FIND RAD&OTH EXAM LUNG FIELD  . PREDIABETES  . Healthcare maintenance  . GERD (gastroesophageal reflux disease)  . Lip swelling  . 1st MTP arthritis   Past Medical History  Diagnosis Date  . Hypertension   . Hyperlipidemia   . GERD (gastroesophageal reflux disease)   . Legg-Perthes disease     Right hip  . Fasting hyperglycemia     A1c 6.1%    . History of chicken pox   . Gout     ?due to L great toe pain, saw Dr Darrelyn Hillock   Past Surgical History  Procedure Date  . Hernia repair     double hernia  . Tonsillectomy   . Wisdom tooth extraction   . Vasectomy   . Colonoscopy 03/2011    2 tubular adenomas, rec rpt 5 yrs   History  Substance Use Topics  . Smoking status: Never Smoker   . Smokeless tobacco: Never Used  . Alcohol Use: No   Family History  Problem Relation Age of Onset  . Breast cancer Mother     with recurrence  . Alzheimer's disease Father   . Healthy Sister   . Cancer Maternal Aunt     ?  Marland Kitchen Hypertension Maternal Uncle   . Stroke Maternal Uncle   . Coronary artery disease Paternal Uncle     MI  . Cancer Other     niece-2 types  . Stroke Maternal Grandfather    Allergies  Allergen Reactions  . Lisinopril Swelling    ?angioedema - lip swelling, hives   Current Outpatient Prescriptions on File Prior to Visit  Medication Sig Dispense Refill  . aspirin 81 MG tablet Take 81 mg by mouth daily.        . [DISCONTINUED] amLODipine (NORVASC) 5 MG tablet Take 1 tablet (5 mg total) by mouth daily.  90 tablet  3  . [  DISCONTINUED] metoprolol succinate (TOPROL-XL) 25 MG 24 hr tablet TAKE ONE TABLET BY MOUTH EVERY DAY  30 tablet  0  . [DISCONTINUED] omeprazole (PRILOSEC) 20 MG capsule Take 1 capsule (20 mg total) by mouth daily as needed.  30 capsule  11  . [DISCONTINUED] lisinopril (PRINIVIL,ZESTRIL) 5 MG tablet Take 1 tablet (5 mg total) by mouth daily.  90 tablet  3     Review of Systems  Constitutional: Negative for fever, chills, activity change, appetite change, fatigue and unexpected weight change.  HENT: Negative for hearing loss and neck pain.   Eyes: Negative for visual disturbance.  Respiratory: Negative for cough, chest tightness, shortness of breath and wheezing.   Cardiovascular: Negative for chest pain, palpitations and leg swelling.  Gastrointestinal: Negative for nausea, vomiting, abdominal  pain, diarrhea, constipation, blood in stool and abdominal distention.  Genitourinary: Negative for hematuria and difficulty urinating.  Musculoskeletal: Negative for myalgias and arthralgias.  Skin: Negative for rash.  Neurological: Negative for dizziness, seizures, syncope and headaches.  Hematological: Does not bruise/bleed easily.  Psychiatric/Behavioral: Negative for dysphoric mood. The patient is not nervous/anxious.        Objective:   Physical Exam  Nursing note and vitals reviewed. Constitutional: He is oriented to person, place, and time. He appears well-developed and well-nourished. No distress.  HENT:  Head: Normocephalic and atraumatic.  Right Ear: External ear normal.  Left Ear: External ear normal.  Nose: Nose normal.  Mouth/Throat: Oropharynx is clear and moist. No oropharyngeal exudate.  Eyes: Conjunctivae normal and EOM are normal. Pupils are equal, round, and reactive to light. No scleral icterus.  Neck: Normal range of motion. Neck supple. Carotid bruit is not present.  Cardiovascular: Normal rate, regular rhythm, normal heart sounds and intact distal pulses.   No murmur heard. Pulses:      Radial pulses are 2+ on the right side, and 2+ on the left side.  Pulmonary/Chest: Effort normal and breath sounds normal. No respiratory distress. He has no wheezes. He has no rales.  Abdominal: Soft. Bowel sounds are normal. He exhibits no distension and no mass. There is no tenderness. There is no rebound and no guarding.  Genitourinary: Rectum normal and prostate normal. Rectal exam shows no external hemorrhoid, no internal hemorrhoid, no fissure, no mass, no tenderness and anal tone normal. Prostate is not enlarged (20gm) and not tender.  Musculoskeletal: Normal range of motion. He exhibits no edema.       foot exam: Normal inspection No skin breakdown No calluses  Normal DP/PT pulses - 2+ DP bilaterally Normal sensation to light touch and monofilament Nails thickened  and onycholytic  Lymphadenopathy:    He has no cervical adenopathy.  Neurological: He is alert and oriented to person, place, and time.       CN grossly intact, station and gait intact  Skin: Skin is warm and dry. No rash noted.  Psychiatric: He has a normal mood and affect. His behavior is normal. Judgment and thought content normal.       Assessment & Plan:

## 2012-10-15 NOTE — Addendum Note (Signed)
Addended by: Josph Macho A on: 10/15/2012 11:59 AM   Modules accepted: Orders

## 2012-10-15 NOTE — Assessment & Plan Note (Signed)
Mild. Off meds.  Encouraged to continue to watch diet.

## 2012-10-15 NOTE — Assessment & Plan Note (Signed)
Preventative protocols reviewed and updated unless pt declined. Discussed healthy diet and lifestyle. I have personally reviewed the Medicare Annual Wellness questionnaire and have noted 1. The patient's medical and social history 2. Their use of alcohol, tobacco or illicit drugs 3. Their current medications and supplements 4. The patient's functional ability including ADL's, fall risks, home safety risks and hearing or visual impairment. 5. Diet and physical activity 6. Evidence for depression or mood disorders The patients weight, height, BMI have been recorded in the chart.  Hearing and vision has been addressed. I have made referrals, counseling and provided education to the patient based review of the above and I have provided the pt with a written personalized care plan for preventive services. See scanned questionairre. Advanced directives discussed. Reviewed preventative protocols and updated unless pt declined. pneumovax and flu today.

## 2012-10-15 NOTE — Assessment & Plan Note (Signed)
Chronic, stable. Continue meds. 

## 2012-10-15 NOTE — Assessment & Plan Note (Signed)
Does not necessarily describe neuropathy and normal monofilament test today. Consider checking vit B12 next visit. rec monitor for now, if worsening to return.

## 2012-10-15 NOTE — Patient Instructions (Addendum)
Flu and pneumonia shots today. Keep an eye on feet.  If worsening ,let me know. Good to see you today, call us with question.

## 2012-10-15 NOTE — Assessment & Plan Note (Signed)
Discussed A1c, rec avoid sweets. Lab Results  Component Value Date   HGBA1C 6.2 10/08/2012

## 2013-03-31 ENCOUNTER — Other Ambulatory Visit: Payer: Self-pay | Admitting: Family Medicine

## 2013-04-01 ENCOUNTER — Other Ambulatory Visit: Payer: Self-pay | Admitting: Orthopedic Surgery

## 2013-04-10 HISTORY — PX: LUMBAR FUSION: SHX111

## 2013-04-14 ENCOUNTER — Encounter (HOSPITAL_COMMUNITY): Payer: Self-pay

## 2013-04-14 NOTE — Pre-Procedure Instructions (Signed)
Devin Huffman  04/14/2013   Your procedure is scheduled on:  Wed, May 14 @ 11:50 AM  Report to [facility] at 9:45 AM.  Call this number if you have problems the morning of surgery: [phone]   Remember:   Do not eat food or drink liquids after midnight.   Take these medicines the morning of surgery with A SIP OF WATER: Amlodipine(Norvasc),Omeprazole(Prilosec),and Metoprolol(Toprol)   Do not wear jewelry  Do not wear lotions, powders, or colognes. You may wear deodorant.  Men may shave face and neck.  Do not bring valuables to the hospital.  Contacts, dentures or bridgework may not be worn into surgery.  Leave suitcase in the car. After surgery it may be brought to your room.  For patients admitted to the hospital, checkout time is 11:00 AM the day of  discharge.   Patients discharged the day of surgery will not be allowed to drive  home.    Special Instructions: Shower using CHG 2 nights before surgery and the night before surgery.  If you shower the day of surgery use CHG.  Use special wash - you have one bottle of CHG for all showers.  You should use approximately 1/3 of the bottle for each shower.   Please read over the following fact sheets that you were given: Pain Booklet, Coughing and Deep Breathing, Blood Transfusion Information, MRSA Information and Surgical Site Infection Prevention

## 2013-04-15 ENCOUNTER — Encounter (HOSPITAL_COMMUNITY)
Admission: RE | Admit: 2013-04-15 | Discharge: 2013-04-15 | Disposition: A | Discharge status: Home / Self Care | Payer: Worker's Compensation | Source: Ambulatory Visit | Attending: Orthopedic Surgery | Admitting: Orthopedic Surgery

## 2013-04-15 ENCOUNTER — Encounter (HOSPITAL_COMMUNITY): Payer: Self-pay

## 2013-04-15 DIAGNOSIS — Z01812 Encounter for preprocedural laboratory examination: Secondary | ICD-10-CM | POA: Insufficient documentation

## 2013-04-15 DIAGNOSIS — Z01818 Encounter for other preprocedural examination: Secondary | ICD-10-CM | POA: Insufficient documentation

## 2013-04-15 DIAGNOSIS — K219 Gastro-esophageal reflux disease without esophagitis: Secondary | ICD-10-CM | POA: Insufficient documentation

## 2013-04-15 DIAGNOSIS — Z01810 Encounter for preprocedural cardiovascular examination: Secondary | ICD-10-CM | POA: Insufficient documentation

## 2013-04-15 DIAGNOSIS — I10 Essential (primary) hypertension: Secondary | ICD-10-CM | POA: Insufficient documentation

## 2013-04-15 DIAGNOSIS — M79609 Pain in unspecified limb: Secondary | ICD-10-CM | POA: Insufficient documentation

## 2013-04-15 LAB — CBC WITH DIFFERENTIAL/PLATELET
Basophils Relative: 0 % (ref 0–1)
Eosinophils Absolute: 0.1 10*3/uL (ref 0.0–0.7)
Hemoglobin: 14.6 g/dL (ref 13.0–17.0)
MCH: 30.1 pg (ref 26.0–34.0)
MCHC: 35.1 g/dL (ref 30.0–36.0)
Monocytes Relative: 10 % (ref 3–12)
Neutrophils Relative %: 56 % (ref 43–77)
RDW: 14.4 % (ref 11.5–15.5)

## 2013-04-15 LAB — URINALYSIS, ROUTINE W REFLEX MICROSCOPIC
Bilirubin Urine: NEGATIVE
Ketones, ur: NEGATIVE mg/dL
Nitrite: NEGATIVE
Protein, ur: NEGATIVE mg/dL
Urobilinogen, UA: 0.2 mg/dL (ref 0.0–1.0)

## 2013-04-15 LAB — SURGICAL PCR SCREEN
MRSA, PCR: NEGATIVE
Staphylococcus aureus: NEGATIVE

## 2013-04-15 LAB — COMPREHENSIVE METABOLIC PANEL
Albumin: 3.8 g/dL (ref 3.5–5.2)
Alkaline Phosphatase: 117 U/L (ref 39–117)
BUN: 17 mg/dL (ref 6–23)
Creatinine, Ser: 0.92 mg/dL (ref 0.50–1.35)
Potassium: 3.9 mEq/L (ref 3.5–5.1)
Total Protein: 7.3 g/dL (ref 6.0–8.3)

## 2013-04-15 LAB — PROTIME-INR
INR: 0.96 (ref 0.00–1.49)
Prothrombin Time: 12.7 seconds (ref 11.6–15.2)

## 2013-04-15 LAB — TYPE AND SCREEN: Antibody Screen: NEGATIVE

## 2013-04-22 MED ORDER — CEFAZOLIN SODIUM-DEXTROSE 2-3 GM-% IV SOLR
2.0000 g | INTRAVENOUS | Status: AC
  Administered 2013-04-23 (×2): 2 g via INTRAVENOUS
  Filled 2013-04-22: qty 50

## 2013-04-23 ENCOUNTER — Encounter (HOSPITAL_COMMUNITY): Payer: Self-pay | Admitting: Anesthesiology

## 2013-04-23 ENCOUNTER — Inpatient Hospital Stay (HOSPITAL_COMMUNITY)
Admission: RE | Admit: 2013-04-23 | Discharge: 2013-04-26 | DRG: 460 | Disposition: A | Discharge status: Home Health | Payer: Worker's Compensation | Source: Ambulatory Visit | Attending: Orthopedic Surgery | Admitting: Orthopedic Surgery

## 2013-04-23 ENCOUNTER — Inpatient Hospital Stay (HOSPITAL_COMMUNITY): Payer: Worker's Compensation | Admitting: Anesthesiology

## 2013-04-23 ENCOUNTER — Inpatient Hospital Stay (HOSPITAL_COMMUNITY): Payer: Worker's Compensation

## 2013-04-23 ENCOUNTER — Encounter (HOSPITAL_COMMUNITY): Payer: Self-pay | Admitting: Certified Registered Nurse Anesthetist

## 2013-04-23 ENCOUNTER — Encounter (HOSPITAL_COMMUNITY)
Admission: RE | Disposition: A | Discharge status: Home Health | Payer: Self-pay | Source: Ambulatory Visit | Attending: Orthopedic Surgery

## 2013-04-23 DIAGNOSIS — M109 Gout, unspecified: Secondary | ICD-10-CM | POA: Diagnosis present

## 2013-04-23 DIAGNOSIS — K219 Gastro-esophageal reflux disease without esophagitis: Secondary | ICD-10-CM | POA: Diagnosis present

## 2013-04-23 DIAGNOSIS — Y990 Civilian activity done for income or pay: Secondary | ICD-10-CM

## 2013-04-23 DIAGNOSIS — E785 Hyperlipidemia, unspecified: Secondary | ICD-10-CM | POA: Diagnosis present

## 2013-04-23 DIAGNOSIS — X58XXXA Exposure to other specified factors, initial encounter: Secondary | ICD-10-CM | POA: Diagnosis present

## 2013-04-23 DIAGNOSIS — M5126 Other intervertebral disc displacement, lumbar region: Principal | ICD-10-CM | POA: Diagnosis present

## 2013-04-23 DIAGNOSIS — Z7982 Long term (current) use of aspirin: Secondary | ICD-10-CM

## 2013-04-23 DIAGNOSIS — I10 Essential (primary) hypertension: Secondary | ICD-10-CM | POA: Diagnosis present

## 2013-04-23 DIAGNOSIS — M9190 Juvenile osteochondrosis of hip and pelvis, unspecified, unspecified leg: Secondary | ICD-10-CM | POA: Diagnosis present

## 2013-04-23 DIAGNOSIS — Q762 Congenital spondylolisthesis: Secondary | ICD-10-CM

## 2013-04-23 DIAGNOSIS — Z79899 Other long term (current) drug therapy: Secondary | ICD-10-CM

## 2013-04-23 SURGERY — POSTERIOR LUMBAR FUSION 1 LEVEL
Anesthesia: General | Site: Spine Lumbar | Laterality: Right | Wound class: Clean

## 2013-04-23 MED ORDER — LIDOCAINE HCL (CARDIAC) 20 MG/ML IV SOLN
INTRAVENOUS | Status: DC | PRN
  Administered 2013-04-23: 60 mg via INTRAVENOUS

## 2013-04-23 MED ORDER — ONDANSETRON HCL 4 MG/2ML IJ SOLN: 4.0000 mg | Freq: Once | INTRAMUSCULAR | Status: DC | PRN

## 2013-04-23 MED ORDER — ZOLPIDEM TARTRATE 5 MG PO TABS: 5.0000 mg | ORAL_TABLET | Freq: Every evening | ORAL | Status: DC | PRN

## 2013-04-23 MED ORDER — BUPIVACAINE-EPINEPHRINE 0.25% -1:200000 IJ SOLN
INTRAMUSCULAR | Status: DC | PRN
  Administered 2013-04-23: 25 mL

## 2013-04-23 MED ORDER — BISACODYL 5 MG PO TBEC: 5.0000 mg | DELAYED_RELEASE_TABLET | Freq: Every day | ORAL | Status: DC | PRN

## 2013-04-23 MED ORDER — PANTOPRAZOLE SODIUM 40 MG PO TBEC
40.0000 mg | DELAYED_RELEASE_TABLET | Freq: Every day | ORAL | Status: DC
  Administered 2013-04-24 – 2013-04-26 (×3): 40 mg via ORAL
  Filled 2013-04-23 (×3): qty 1

## 2013-04-23 MED ORDER — ALUM & MAG HYDROXIDE-SIMETH 200-200-20 MG/5ML PO SUSP: 30.0000 mL | Freq: Four times a day (QID) | ORAL | Status: DC | PRN

## 2013-04-23 MED ORDER — MIDAZOLAM HCL 5 MG/5ML IJ SOLN
INTRAMUSCULAR | Status: DC | PRN
  Administered 2013-04-23: 100 mg via INTRAVENOUS
  Administered 2013-04-23: 2 mg via INTRAVENOUS

## 2013-04-23 MED ORDER — CEFAZOLIN SODIUM 1-5 GM-% IV SOLN
1.0000 g | Freq: Three times a day (TID) | INTRAVENOUS | Status: AC
  Administered 2013-04-23 – 2013-04-24 (×2): 1 g via INTRAVENOUS
  Filled 2013-04-23 (×2): qty 50

## 2013-04-23 MED ORDER — ACETAMINOPHEN 650 MG RE SUPP: 650.0000 mg | RECTAL | Status: DC | PRN

## 2013-04-23 MED ORDER — ACETAMINOPHEN 10 MG/ML IV SOLN
INTRAVENOUS | Status: AC
  Administered 2013-04-23: 1000 mg via INTRAVENOUS
  Filled 2013-04-23: qty 100

## 2013-04-23 MED ORDER — PROPOFOL 10 MG/ML IV BOLUS
INTRAVENOUS | Status: DC | PRN
  Administered 2013-04-23: 180 mg via INTRAVENOUS

## 2013-04-23 MED ORDER — MENTHOL 3 MG MT LOZG: 1.0000 | LOZENGE | OROMUCOSAL | Status: DC | PRN

## 2013-04-23 MED ORDER — THROMBIN 20000 UNITS EX SOLR
CUTANEOUS | Status: DC | PRN
  Administered 2013-04-23: 13:00:00 via TOPICAL

## 2013-04-23 MED ORDER — PROPOFOL INFUSION 10 MG/ML OPTIME
INTRAVENOUS | Status: DC | PRN
  Administered 2013-04-23: 75 ug/kg/min via INTRAVENOUS

## 2013-04-23 MED ORDER — FLEET ENEMA 7-19 GM/118ML RE ENEM: 1.0000 | ENEMA | Freq: Once | RECTAL | Status: AC | PRN

## 2013-04-23 MED ORDER — SENNOSIDES-DOCUSATE SODIUM 8.6-50 MG PO TABS: 1.0000 | ORAL_TABLET | Freq: Every evening | ORAL | Status: DC | PRN

## 2013-04-23 MED ORDER — LACTATED RINGERS IV SOLN
INTRAVENOUS | Status: DC
  Administered 2013-04-23: 11:00:00 via INTRAVENOUS

## 2013-04-23 MED ORDER — HYDROMORPHONE HCL PF 1 MG/ML IJ SOLN: 0.2500 mg | INTRAMUSCULAR | Status: DC | PRN

## 2013-04-23 MED ORDER — AMLODIPINE BESYLATE 5 MG PO TABS
5.0000 mg | ORAL_TABLET | Freq: Every day | ORAL | Status: DC
  Administered 2013-04-23 – 2013-04-25 (×3): 5 mg via ORAL
  Filled 2013-04-23 (×4): qty 1

## 2013-04-23 MED ORDER — OXYCODONE-ACETAMINOPHEN 5-325 MG PO TABS
1.0000 | ORAL_TABLET | ORAL | Status: DC | PRN
  Administered 2013-04-24 – 2013-04-26 (×10): 2 via ORAL
  Filled 2013-04-23: qty 2
  Filled 2013-04-23: qty 1
  Filled 2013-04-23 (×6): qty 2
  Filled 2013-04-23: qty 1
  Filled 2013-04-23 (×3): qty 2

## 2013-04-23 MED ORDER — HYDROCODONE-ACETAMINOPHEN 5-325 MG PO TABS
1.0000 | ORAL_TABLET | ORAL | Status: DC | PRN
  Administered 2013-04-24: 1 via ORAL
  Filled 2013-04-23: qty 1

## 2013-04-23 MED ORDER — METOPROLOL SUCCINATE ER 25 MG PO TB24
25.0000 mg | ORAL_TABLET | Freq: Every day | ORAL | Status: DC
  Administered 2013-04-24 – 2013-04-26 (×3): 25 mg via ORAL
  Filled 2013-04-23 (×4): qty 1

## 2013-04-23 MED ORDER — BUPIVACAINE-EPINEPHRINE PF 0.25-1:200000 % IJ SOLN
INTRAMUSCULAR | Status: AC
  Filled 2013-04-23: qty 30

## 2013-04-23 MED ORDER — ACETAMINOPHEN 325 MG PO TABS
650.0000 mg | ORAL_TABLET | ORAL | Status: DC | PRN
  Filled 2013-04-23: qty 2

## 2013-04-23 MED ORDER — ACETAMINOPHEN 10 MG/ML IV SOLN: 1000.0000 mg | Freq: Once | INTRAVENOUS | Status: DC | PRN

## 2013-04-23 MED ORDER — FENTANYL CITRATE 0.05 MG/ML IJ SOLN
INTRAMUSCULAR | Status: DC | PRN
  Administered 2013-04-23: 100 ug via INTRAVENOUS
  Administered 2013-04-23: 50 ug via INTRAVENOUS
  Administered 2013-04-23: 100 ug via INTRAVENOUS

## 2013-04-23 MED ORDER — ROCURONIUM BROMIDE 100 MG/10ML IV SOLN
INTRAVENOUS | Status: DC | PRN
  Administered 2013-04-23: 20 mg via INTRAVENOUS
  Administered 2013-04-23: 50 mg via INTRAVENOUS
  Administered 2013-04-23: 10 mg via INTRAVENOUS

## 2013-04-23 MED ORDER — MORPHINE SULFATE 2 MG/ML IJ SOLN
1.0000 mg | INTRAMUSCULAR | Status: DC | PRN
  Administered 2013-04-23 – 2013-04-24 (×3): 2 mg via INTRAVENOUS
  Filled 2013-04-23 (×3): qty 1

## 2013-04-23 MED ORDER — METHYLENE BLUE 1 % INJ SOLN
INTRAMUSCULAR | Status: AC
  Filled 2013-04-23: qty 10

## 2013-04-23 MED ORDER — ONDANSETRON HCL 4 MG/2ML IJ SOLN: 4.0000 mg | INTRAMUSCULAR | Status: DC | PRN

## 2013-04-23 MED ORDER — HEMOSTATIC AGENTS (NO CHARGE) OPTIME
TOPICAL | Status: DC | PRN
  Administered 2013-04-23: 1 via TOPICAL

## 2013-04-23 MED ORDER — PHENOL 1.4 % MT LIQD: 1.0000 | OROMUCOSAL | Status: DC | PRN

## 2013-04-23 MED ORDER — ALBUMIN HUMAN 5 % IV SOLN
INTRAVENOUS | Status: DC | PRN
  Administered 2013-04-23 (×2): via INTRAVENOUS

## 2013-04-23 MED ORDER — SODIUM CHLORIDE 0.9 % IJ SOLN
3.0000 mL | Freq: Two times a day (BID) | INTRAMUSCULAR | Status: DC
  Administered 2013-04-24: 3 mL via INTRAVENOUS

## 2013-04-23 MED ORDER — SODIUM CHLORIDE 0.9 % IV SOLN: 250.0000 mL | INTRAVENOUS | Status: DC

## 2013-04-23 MED ORDER — ARTIFICIAL TEARS OP OINT
TOPICAL_OINTMENT | OPHTHALMIC | Status: DC | PRN
  Administered 2013-04-23: 1 via OPHTHALMIC

## 2013-04-23 MED ORDER — SODIUM CHLORIDE 0.9 % IV SOLN
INTRAVENOUS | Status: DC
  Administered 2013-04-23: 75 mL/h via INTRAVENOUS

## 2013-04-23 MED ORDER — THROMBIN 20000 UNITS EX SOLR
CUTANEOUS | Status: AC
  Filled 2013-04-23: qty 40000

## 2013-04-23 MED ORDER — LACTATED RINGERS IV SOLN
INTRAVENOUS | Status: DC | PRN
  Administered 2013-04-23 (×4): via INTRAVENOUS

## 2013-04-23 MED ORDER — PHENYLEPHRINE HCL 10 MG/ML IJ SOLN
10.0000 mg | INTRAVENOUS | Status: DC | PRN
  Administered 2013-04-23: 100 ug/min via INTRAVENOUS

## 2013-04-23 MED ORDER — CEFAZOLIN SODIUM-DEXTROSE 2-3 GM-% IV SOLR
2.0000 g | INTRAVENOUS | Status: DC
  Filled 2013-04-23: qty 50

## 2013-04-23 MED ORDER — LIDOCAINE HCL 4 % MT SOLN
OROMUCOSAL | Status: DC | PRN
  Administered 2013-04-23: 4 mL via TOPICAL

## 2013-04-23 MED ORDER — DIAZEPAM 5 MG PO TABS
5.0000 mg | ORAL_TABLET | Freq: Four times a day (QID) | ORAL | Status: DC | PRN
  Administered 2013-04-24 – 2013-04-25 (×3): 5 mg via ORAL
  Filled 2013-04-23 (×4): qty 1

## 2013-04-23 MED ORDER — SODIUM CHLORIDE 0.9 % IJ SOLN: 3.0000 mL | INTRAMUSCULAR | Status: DC | PRN

## 2013-04-23 MED ORDER — DOCUSATE SODIUM 100 MG PO CAPS
100.0000 mg | ORAL_CAPSULE | Freq: Two times a day (BID) | ORAL | Status: DC
  Administered 2013-04-23 – 2013-04-26 (×6): 100 mg via ORAL
  Filled 2013-04-23 (×6): qty 1

## 2013-04-23 SURGICAL SUPPLY — 75 items
BENZOIN TINCTURE PRP APPL 2/3 (GAUZE/BANDAGES/DRESSINGS) ×2 IMPLANT
BLADE SURG ROTATE 9660 (MISCELLANEOUS) IMPLANT
BUR ROUND PRECISION 4.0 (BURR) ×2 IMPLANT
CAGE CONCORDE BULLET 11X13X27 (Cage) ×2 IMPLANT
CARTRIDGE OIL MAESTRO DRILL (MISCELLANEOUS) ×1 IMPLANT
CLOTH BEACON ORANGE TIMEOUT ST (SAFETY) ×2 IMPLANT
CLSR STERI-STRIP ANTIMIC 1/2X4 (GAUZE/BANDAGES/DRESSINGS) ×2 IMPLANT
CONT SPEC STER OR (MISCELLANEOUS) ×2 IMPLANT
CORDS BIPOLAR (ELECTRODE) ×2 IMPLANT
COVER SURGICAL LIGHT HANDLE (MISCELLANEOUS) ×2 IMPLANT
DIFFUSER DRILL AIR PNEUMATIC (MISCELLANEOUS) ×2 IMPLANT
DRAIN CHANNEL 15F RND FF W/TCR (WOUND CARE) IMPLANT
DRAPE C-ARM 42X72 X-RAY (DRAPES) ×2 IMPLANT
DRAPE ORTHO SPLIT 77X108 STRL (DRAPES) ×1
DRAPE POUCH INSTRU U-SHP 10X18 (DRAPES) ×2 IMPLANT
DRAPE SURG 17X23 STRL (DRAPES) ×6 IMPLANT
DRAPE SURG ORHT 6 SPLT 77X108 (DRAPES) ×1 IMPLANT
DURAPREP 26ML APPLICATOR (WOUND CARE) ×2 IMPLANT
ELECT BLADE 4.0 EZ CLEAN MEGAD (MISCELLANEOUS) ×2
ELECT CAUTERY BLADE 6.4 (BLADE) ×2 IMPLANT
ELECT REM PT RETURN 9FT ADLT (ELECTROSURGICAL) ×2
ELECTRODE BLDE 4.0 EZ CLN MEGD (MISCELLANEOUS) ×1 IMPLANT
ELECTRODE REM PT RTRN 9FT ADLT (ELECTROSURGICAL) ×1 IMPLANT
EVACUATOR SILICONE 100CC (DRAIN) IMPLANT
GAUZE SPONGE 4X4 16PLY XRAY LF (GAUZE/BANDAGES/DRESSINGS) ×10 IMPLANT
GLOVE BIO SURGEON STRL SZ7 (GLOVE) ×6 IMPLANT
GLOVE BIO SURGEON STRL SZ8 (GLOVE) ×2 IMPLANT
GLOVE BIOGEL PI IND STRL 7.5 (GLOVE) ×3 IMPLANT
GLOVE BIOGEL PI IND STRL 8 (GLOVE) ×1 IMPLANT
GLOVE BIOGEL PI INDICATOR 7.5 (GLOVE) ×3
GLOVE BIOGEL PI INDICATOR 8 (GLOVE) ×1
GOWN STRL NON-REIN LRG LVL3 (GOWN DISPOSABLE) ×4 IMPLANT
GOWN STRL REIN XL XLG (GOWN DISPOSABLE) ×2 IMPLANT
IV CATH 14GX2 1/4 (CATHETERS) ×2 IMPLANT
KIT BASIN OR (CUSTOM PROCEDURE TRAY) ×2 IMPLANT
KIT POSITION SURG JACKSON T1 (MISCELLANEOUS) IMPLANT
KIT ROOM TURNOVER OR (KITS) ×2 IMPLANT
MARKER SKIN DUAL TIP RULER LAB (MISCELLANEOUS) ×2 IMPLANT
NEEDLE 22X1 1/2 (OR ONLY) (NEEDLE) ×2 IMPLANT
NEEDLE BONE MARROW 8GX6 FENEST (NEEDLE) ×2 IMPLANT
NEEDLE HYPO 25GX1X1/2 BEV (NEEDLE) ×2 IMPLANT
NEEDLE SPNL 18GX3.5 QUINCKE PK (NEEDLE) ×4 IMPLANT
NS IRRIG 1000ML POUR BTL (IV SOLUTION) ×4 IMPLANT
OIL CARTRIDGE MAESTRO DRILL (MISCELLANEOUS) ×2
PACK LAMINECTOMY ORTHO (CUSTOM PROCEDURE TRAY) ×2 IMPLANT
PACK UNIVERSAL I (CUSTOM PROCEDURE TRAY) ×2 IMPLANT
PACK VITOSS BIOACTIVE 10CC (Neuro Prosthesis/Implant) ×2 IMPLANT
PAD ARMBOARD 7.5X6 YLW CONV (MISCELLANEOUS) ×4 IMPLANT
PATTIES SURGICAL .5 X1 (DISPOSABLE) ×2 IMPLANT
PATTIES SURGICAL .5X1.5 (GAUZE/BANDAGES/DRESSINGS) IMPLANT
ROD PRE BENT EXP 40MM (Rod) ×2 IMPLANT
ROD PRE BENT EXPEDIUM 35MM (Rod) ×2 IMPLANT
SCREW POLYAXIAL 7X45MM (Screw) ×8 IMPLANT
SCREW SET SINGLE INNER (Screw) ×8 IMPLANT
SPONGE GAUZE 4X4 12PLY (GAUZE/BANDAGES/DRESSINGS) ×2 IMPLANT
SPONGE INTESTINAL PEANUT (DISPOSABLE) ×4 IMPLANT
SPONGE SURGIFOAM ABS GEL 100 (HEMOSTASIS) ×2 IMPLANT
STRIP CLOSURE SKIN 1/2X4 (GAUZE/BANDAGES/DRESSINGS) ×4 IMPLANT
SURGIFLO TRUKIT (HEMOSTASIS) IMPLANT
SUT BONE WAX W31G (SUTURE) ×2 IMPLANT
SUT MNCRL AB 4-0 PS2 18 (SUTURE) ×4 IMPLANT
SUT VIC AB 0 CT1 18XCR BRD 8 (SUTURE) ×1 IMPLANT
SUT VIC AB 0 CT1 8-18 (SUTURE) ×1
SUT VIC AB 1 CT1 18XCR BRD 8 (SUTURE) ×2 IMPLANT
SUT VIC AB 1 CT1 8-18 (SUTURE) ×2
SUT VIC AB 2-0 CT2 18 VCP726D (SUTURE) ×2 IMPLANT
SYR 20CC LL (SYRINGE) ×4 IMPLANT
SYR BULB IRRIGATION 50ML (SYRINGE) ×2 IMPLANT
SYR CONTROL 10ML LL (SYRINGE) ×4 IMPLANT
TAPE CLOTH SURG 6X10 WHT LF (GAUZE/BANDAGES/DRESSINGS) ×2 IMPLANT
TOWEL OR 17X24 6PK STRL BLUE (TOWEL DISPOSABLE) ×2 IMPLANT
TOWEL OR 17X26 10 PK STRL BLUE (TOWEL DISPOSABLE) ×2 IMPLANT
TRAY FOLEY CATH 14FR (SET/KITS/TRAYS/PACK) ×2 IMPLANT
WATER STERILE IRR 1000ML POUR (IV SOLUTION) ×2 IMPLANT
YANKAUER SUCT BULB TIP NO VENT (SUCTIONS) ×8 IMPLANT

## 2013-04-23 NOTE — Transfer of Care (Signed)
Immediate Anesthesia Transfer of Care Note  Patient: [name]  Procedure(s) Performed: Procedure(s) with comments: POSTERIOR LUMBAR FUSION 1 LEVEL (Right) - Right sided lumbar 4-5 transforaminal lumbar interbody fusion with instrumentation, bone marrow aspirate, vitoss.  Patient Location: PACU  Anesthesia Type:General  Level of Consciousness: sedated  Airway & Oxygen Therapy: Patient Spontanous Breathing and Patient connected to nasal cannula oxygen  Post-op Assessment: Report given to PACU RN and Post -op Vital signs reviewed and stable  Post vital signs: Reviewed and stable  Complications: No apparent anesthesia complications

## 2013-04-23 NOTE — H&P (Signed)
PREOPERATIVE H&P  Chief Complaint: right leg pain  HPI: Devin Huffman is a 67 y.o. male who presents with ongoing right leg pain following a work injury. The patient failed multiple forms of conservative care.  Past Medical History  Diagnosis Date  . Hypertension   . Hyperlipidemia   . GERD (gastroesophageal reflux disease)   . Legg-Perthes disease     Right hip  . Fasting hyperglycemia     A1c 6.1%  . History of chicken pox   . Gout     ?due to L great toe pain, saw Dr Darrelyn Hillock  . Shortness of breath     with exertion   Past Surgical History  Procedure Laterality Date  . Hernia repair      double hernia  . Tonsillectomy    . Wisdom tooth extraction    . Vasectomy    . Colonoscopy  03/2011    2 tubular adenomas, rec rpt 5 yrs  . Tonsillectomy     History   Social History  . Marital Status: Married    Spouse Name: N/A    Number of Children: N/A  . Years of Education: N/A   Social History Main Topics  . Smoking status: Never Smoker   . Smokeless tobacco: Never Used  . Alcohol Use: No  . Drug Use: No  . Sexually Active: Not on file   Other Topics Concern  . 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  No regular exercise.   Diet: "I love my ice cream. I can't live forever", some water   Family History  Problem Relation Age of Onset  . Breast cancer Mother     with recurrence  . Alzheimer's disease Father   . Healthy Sister   . Cancer Maternal Aunt     ?  Marland Kitchen Hypertension Maternal Uncle   . Stroke Maternal Uncle   . Coronary artery disease Paternal Uncle     MI  . Cancer Other     niece-2 types  . Stroke Maternal Grandfather    Allergies  Allergen Reactions  . Lisinopril Swelling    ?angioedema - lip swelling, hives   Prior to Admission medications   Medication Sig Start  Date End Date Taking? Authorizing Provider  amLODipine (NORVASC) 5 MG tablet Take 1 tablet (5 mg total) by mouth daily. 10/15/12 10/15/13 Yes Eustaquio Boyden, MD  metoprolol succinate (TOPROL-XL) 25 MG 24 hr tablet Take 1 tablet (25 mg total) by mouth daily. 10/15/12  Yes Eustaquio Boyden, MD  omeprazole (PRILOSEC) 20 MG capsule Take 20 mg by mouth daily.   Yes Historical Provider, MD  aspirin EC 81 MG tablet Take 81 mg by mouth daily.    Historical Provider, MD  ibuprofen (ADVIL,MOTRIN) 200 MG tablet Take 800 mg by mouth 2 (two) times daily as needed for pain.    Historical Provider, MD     All other systems have been reviewed and were otherwise negative with the exception of those mentioned in the HPI and as above.  Physical Exam: There were no vitals filed for this visit.  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

## 2013-04-23 NOTE — Anesthesia Preprocedure Evaluation (Addendum)
Anesthesia Evaluation  Patient identified by MRN, date of birth, ID band Patient awake    Reviewed: Allergy & Precautions, H&P , NPO status , Patient's Chart, lab work & pertinent test results, reviewed documented beta blocker date and time   History of Anesthesia Complications Negative for: history of anesthetic complications  Airway Mallampati: II      Dental  (+) Teeth Intact and Dental Advisory Given   Pulmonary  breath sounds clear to auscultation        Cardiovascular hypertension, Pt. on medications and Pt. on home beta blockers Rhythm:Regular Rate:Normal     Neuro/Psych  Neuromuscular disease (pain, weakness, numbness RLE>LLE) negative psych ROS   GI/Hepatic Neg liver ROS, GERD-  Medicated and Controlled,  Endo/Other  negative endocrine ROSdiabetes (elevated A1C; diet controlled)Morbid obesity  Renal/GU negative Renal ROS     Musculoskeletal  (+) Arthritis -, Osteoarthritis,    Abdominal (+) + obese,   Peds  Hematology negative hematology ROS (+)   Anesthesia Other Findings   Reproductive/Obstetrics                         Anesthesia Physical Anesthesia Plan  ASA: II  Anesthesia Plan: General   Post-op Pain Management:    Induction: Intravenous  Airway Management Planned: Oral ETT  Additional Equipment:   Intra-op Plan:   Post-operative Plan: Extubation in OR  Informed Consent: I have reviewed the patients History and Physical, chart, labs and discussed the procedure including the risks, benefits and alternatives for the proposed anesthesia with the patient or authorized representative who has indicated his/her understanding and acceptance.   Dental advisory given  Plan Discussed with: CRNA, Anesthesiologist and Surgeon  Anesthesia Plan Comments: (HNP L4-5 Htn GERD  Plan GA with oral ETT  Kipp Brood)        Anesthesia Quick Evaluation

## 2013-04-23 NOTE — Anesthesia Procedure Notes (Signed)
Procedure Name: Intubation Date/Time: 04/23/2013 11:56 AM Performed by: Leona Singleton A Pre-anesthesia Checklist: Patient identified and Patient being monitored Patient Re-evaluated:Patient Re-evaluated prior to inductionOxygen Delivery Method: Circle system utilized Preoxygenation: Pre-oxygenation with 100% oxygen Intubation Type: IV induction Ventilation: Mask ventilation without difficulty and Oral airway inserted - appropriate to patient size Laryngoscope Size: Hyacinth Meeker and 2 Grade View: Grade III Tube type: Oral Tube size: 8.0 mm Number of attempts: 1 Airway Equipment and Method: Stylet,  Bougie stylet and LTA kit utilized Placement Confirmation: ETT inserted through vocal cords under direct vision,  positive ETCO2 and breath sounds checked- equal and bilateral Secured at: 23 cm Tube secured with: Tape Dental Injury: Teeth and Oropharynx as per pre-operative assessment

## 2013-04-23 NOTE — Anesthesia Postprocedure Evaluation (Signed)
Anesthesia Post Note  Patient: Devin Huffman  Procedure(s) Performed: Procedure(s) (LRB): POSTERIOR LUMBAR FUSION 1 LEVEL (Right)  Anesthesia type: General  Patient location: PACU  Post pain: Pain level controlled  Post assessment: Patient's Cardiovascular Status Stable  Last Vitals:  Filed Vitals:   04/23/13 1915  BP:   Pulse: 84  Temp:   Resp: 13    Post vital signs: Reviewed and stable  Level of consciousness: alert  Complications: No apparent anesthesia complications

## 2013-04-23 NOTE — Preoperative (Addendum)
Beta Blockers   Reason not to administer Beta Blockers:Not Applicable, Toprol 04/23/13 0730

## 2013-04-24 ENCOUNTER — Encounter: Payer: Self-pay | Admitting: Family Medicine

## 2013-04-24 MED FILL — Heparin Sodium (Porcine) Inj 1000 Unit/ML: INTRAMUSCULAR | Qty: 30 | Status: AC

## 2013-04-24 MED FILL — Sodium Chloride Irrigation Soln 0.9%: Qty: 3000 | Status: AC

## 2013-04-24 MED FILL — Sodium Chloride IV Soln 0.9%: INTRAVENOUS | Qty: 1000 | Status: AC

## 2013-04-24 NOTE — Progress Notes (Signed)
UR COMPLETED  

## 2013-04-24 NOTE — Care Management Note (Signed)
CARE MANAGEMENT NOTE 04/24/2013  Patient:  Devin Huffman, Devin Huffman   Account Number:  [id number]  Date Initiated:  04/24/2013  Documentation initiated by:  Vance Peper  Subjective/Objective Assessment:   67 yr old male s/p Posterior Lumbar fusion     Action/Plan:   CM spoke with patient concerning DME and HH needs at discharge. He is under worker's comp.  Edrick Oh CM with [facility].  Fax [phone]  Ph: [phone]  CM faxed HH and DME orders to [facility]   Anticipated DC Date:  04/25/2013   Anticipated DC Plan:  HOME W HOME HEALTH SERVICES      DC Planning Services  CM consult      [facility] Choice  DURABLE MEDICAL EQUIPMENT  HOME HEALTH   Choice offered to / List presented to:     DME arranged  3-N-1  WALKER - ROLLING  SHOWER STOOL      DME agency  OTHER - SEE NOTE     HH arranged  HH-2 PT      HH agency  OTHER - SEE NOTE   Status of service:  In process, will continue to follow Medicare Important Message given?   (If response is "NO", the following Medicare IM given date fields will be blank) Date Medicare IM given:   Date Additional Medicare IM given:    Discharge Disposition:    Per UR Regulation:    If discussed at Long Length of Stay Meetings, dates discussed:    Comments:

## 2013-04-24 NOTE — Evaluation (Signed)
Occupational Therapy Evaluation Patient Details Name: Devin Huffman MRN: [id number] DOB: 08-03-1946 Today's Date: 04/24/2013 Time: 1314-1400 OT Time Calculation (min): 46 min  OT Assessment / Plan / Recommendation Clinical Impression  Pt is a 67yr old male admitted for L4-L5 fusion post back injury.  Demonstrates slight difficulty with sit to stand and selfcare tasks adhering to back precautions.  Feel pt will benefit from acute care OT to help address theses deficits and provide strengthening and education for discharge home with his wife.  Anticipate no post acute OT needs.    OT Assessment  Patient needs continued OT Services    Follow Up Recommendations  No OT follow up    Barriers to Discharge None    Equipment Recommendations  3 in 1 bedside comode;Tub/shower seat       Frequency  Min 2X/week    Precautions / Restrictions Precautions Precautions: Back Restrictions Weight Bearing Restrictions: No   Pertinent Vitals/Pain Pain 5/10 in the lower back, pt repositioned    ADL  Eating/Feeding: Independent;Simulated Where Assessed - Eating/Feeding: Chair Grooming: Simulated;Min guard Where Assessed - Grooming: Supported standing Upper Body Bathing: Simulated;Set up Where Assessed - Upper Body Bathing: Unsupported sitting Lower Body Bathing: Simulated;Minimal assistance Where Assessed - Lower Body Bathing: Supported sit to stand Upper Body Dressing: Simulated;Set up Where Assessed - Upper Body Dressing: Unsupported sitting Lower Body Dressing: Performed;Minimal assistance Where Assessed - Lower Body Dressing: Supported sit to stand Toilet Transfer: Performed;Minimal assistance Toilet Transfer Method: Other (comment) (ambulate with RW to handicapped toilet) Acupuncturist: Comfort height toilet;Grab bars Where Assessed - Toileting Clothing Manipulation and Hygiene: Sit to stand from 3-in-1 or toilet Tub/Shower Transfer: Minimal assistance Tub/Shower Transfer  Method: Not assessed Equipment Used: Rolling walker;Gait belt Transfers/Ambulation Related to ADLs: Pt overall min assist level for mobility and sit to stand. ADL Comments: Pt able to state 2/3 back precautions after being told earlier in session.  Handout provided for education as well.  Began education on AE use for LB selfcare and DME needs.  Pt will need AE, 3:1, and tub shower seat.      OT Diagnosis: Generalized weakness;Acute pain  OT Problem List: Decreased strength;Impaired balance (sitting and/or standing);Decreased knowledge of precautions;Decreased knowledge of use of DME or AE;Pain OT Treatment Interventions: Self-care/ADL training;Therapeutic activities;Patient/family education;DME and/or AE instruction;Balance training   OT Goals Acute Rehab OT Goals OT Goal Formulation: With patient Time For Goal Achievement: 05/01/13 Potential to Achieve Goals: Good ADL Goals Pt Will Perform Lower Body Dressing: with supervision;with adaptive equipment;Sit to stand from chair ADL Goal: Lower Body Dressing - Progress: Goal set today Pt Will Transfer to Toilet: with set-up;with DME;3-in-1;Maintaining back safety precautions ADL Goal: Toilet Transfer - Progress: Goal set today Pt Will Perform Toileting - Clothing Manipulation: with supervision;Sitting on 3-in-1 or toilet;Standing ADL Goal: Toileting - Clothing Manipulation - Progress: Goal set today Pt Will Perform Toileting - Hygiene: with supervision;with adaptive equipment;Sit to stand from 3-in-1/toilet ADL Goal: Toileting - Hygiene - Progress: Goal set today Pt Will Perform Tub/Shower Transfer: Shower transfer;with DME;Ambulation;Maintaining back safety precautions ADL Goal: Tub/Shower Transfer - Progress: Goal set today Miscellaneous OT Goals Miscellaneous OT Goal #1: Pt will state 3/3 back precautions independently. OT Goal: Miscellaneous Goal #1 - Progress: Goal set today Miscellaneous OT Goal #2: Pt will transition supine to sit EOB  in preparation for selfcare tasks with supervision. OT Goal: Miscellaneous Goal #2 - Progress: Goal set today  Visit Information  Last OT Received On: 04/24/13 Assistance  Needed: +1 PT/OT Co-Evaluation/Treatment: Yes    Subjective Data  Subjective: I just don't want to do anything to hurt my back. Patient Stated Goal: To get his back better.   Prior Functioning     Home Living Lives With: Spouse Available Help at Discharge: Available 24 hours/day Type of Home: House Home Access: Stairs to enter [employer] of Steps: 2-3  post beside the steps   back steps have 5-6 but has a rail Home Layout: One level Bathroom Shower/Tub: Health visitor: Handicapped height Bathroom Accessibility: Yes Home Adaptive Equipment: Straight cane Prior Function Level of Independence: Independent Able to Take Stairs?: Yes Driving: Yes Vocation: Full time employment Communication Communication: No difficulties Dominant Hand: Right         Vision/Perception Vision - History Baseline Vision: No visual deficits Patient Visual Report: No change from baseline Vision - Assessment Eye Alignment: Within Functional Limits Vision Assessment: Vision not tested Perception Perception: Within Functional Limits Praxis Praxis: Intact   Cognition  Cognition Arousal/Alertness: Awake/alert Behavior During Therapy: WFL for tasks assessed/performed Overall Cognitive Status: Within Functional Limits for tasks assessed    Extremity/Trunk Assessment Right Upper Extremity Assessment RUE ROM/Strength/Tone: [facility] for tasks assessed;Unable to fully assess;Due to precautions RUE Sensation: WFL - Light Touch RUE Coordination: WFL - gross/fine motor Left Upper Extremity Assessment LUE ROM/Strength/Tone: WFL for tasks assessed;Unable to fully assess;Due to precautions LUE Sensation: WFL - Light Touch LUE Coordination: WFL - gross/fine motor Trunk Assessment Trunk Assessment: Normal      Mobility Bed Mobility Bed Mobility: Rolling Left;Left Sidelying to Sit Rolling Left: 4: Min assist Right Sidelying to Sit: HOB flat Left Sidelying to Sit: 4: Min assist;HOB flat        Balance Balance Balance Assessed: Yes Static Standing Balance Static Standing - Level of Assistance: 5: Stand by assistance   End of Session OT - End of Session Activity Tolerance: Patient limited by pain Patient left: in chair;with call bell/phone within reach Nurse Communication: Mobility status     Ashantia Amaral OTR/L Pager number [phone] 04/24/2013, 2:17 PM

## 2013-04-24 NOTE — Op Note (Signed)
NAMERODDERICK, HOLTZER NO.:  [id number]  MEDICAL RECORD NO.:  [id number]  LOCATION:  5N19C                        FACILITY:  [facility]  PHYSICIAN:  Estill Bamberg, MD      DATE OF BIRTH:  February 06, 1946  DATE OF PROCEDURE:  04/23/2013                              OPERATIVE REPORT   PREOPERATIVE DIAGNOSES: 1. Right-sided L5 radiculopathy. 2. Grade 1 L4-5 spondylolisthesis. 3. Very large L4-5 herniated disk fragment causing obvious and severe     compression on this traversing L5 nerve.  POSTOPERATIVE DIAGNOSES: 1. Right-sided L5 radiculopathy. 2. Grade 1 L4-5 spondylolisthesis. 3. Very large L4-5 herniated disk fragment causing obvious and severe     compression on this traversing L5 nerve.  PROCEDURE: 1. Right-sided L4-5 transforaminal lumbar interbody fusion. 2. Placement of posterior instrumentation L4, L5. 3. Insertion of interbody device x1 (12 mm x 27 mm Concorde bulleted     cage, parallel). 4. Use of local autograft. 5. Intraoperative bone marrow aspiration from the patient's left iliac     crest using a separate incision. 6. Intraoperative use of fluoroscopy.  SURGEON:  Estill Bamberg, MD  ASSISTANT:  Jason Coop, [facility]  ANESTHESIA:  General endotracheal anesthesia.  COMPLICATIONS:  None.  DISPOSITION:  Stable.  ESTIMATED BLOOD LOSS:  350 mL.  INDICATIONS FOR PROCEDURE:  Briefly, Mr. Dinovo is a very pleasant 67- year-old male who did initially present to me on March 10, 2013 after a work injury that occurred on January 29, 2013 when he was working for a [employer].  Since that injury, he has had ongoing pain in his right buttock and right hip.  He did have extensive forms of conservative care, but did continue to have pain.  An MRI did clearly reveal a large L4-5 herniated disk fragment with obvious compression of the traversing L5 nerve on the right.  There was also noted to be a grade 1 spondylolisthesis.  Given his ongoing and severe  pain, we did discuss going forward with an L5-S1 transforaminal lumbar interbody fusion.  The patient fully understood the risks and limitations of the procedure as outlined in my preoperative note.  OPERATIVE DETAILS:  On Apr 23, 2013, the patient brought to the surgery and general endotracheal anesthesia was administered.  The patient was placed prone on a well-padded flat Jackson bed with a Wilson frame. Antibiotics were given and a time-out procedure was performed.  I then made an incision overlying the L4-5 interspace.  Of note, antibiotics were given.  The fascia was incised over the midline.  The lamina of L4 and L5 were subperiosteally exposed.  A lateral intraoperative fluoroscopic view to confirm the appropriate operative levels.  I then proceeded with subperiosteally exposing the transverse processes of L4 and L5 bilaterally.  The L4-5 facet joint was readily noted.  Of note, there was extensive and rather severe hypertrophy of the L4-5 facet joint.  The facet hypertrophy was taken down using a rongeur and a series of Kerrison punches.  I then cannulated the L4 and L5 pedicles, first on the left side.  I did use a 4-mm high-speed bur followed by a gearshift probe, followed by a 6-mm tap.  A ball-tip probe to confirm that there was no cortical violation of the cannulated pedicles.  I then placed 7 x 45 mm screws at L4 and L5.  A 40-mm rod was placed on the left and distraction was applied across the rod and caps were placed, and provisionally tightened.  I then turned my attention towards the patient's right side.  Once again, I did cannulate the L4 and L5 pedicles in the manner described previously.  As on the contralateral side, I did use anatomic landmarks.  After the pedicles were tapped, I did put bone wax in the cannulated pedicle holes.  I then went forward with a full facetectomy on the right side and did entirely remove the facet joint of L4-5.  The traversing L5 nerve  was readily identified. Of note, there was a very large extruded L4-5 disk fragment noted involving the right lateral recess, which did extend centrally as well. The fragment was removed.  With an assistant holding medial retraction of the traversing L5 nerve, I did use a 15 blade knife to perform a generous annulotomy.  A thorough and complete diskectomy was performed. I did use a series of curettes and pituitary rongeurs to complete the diskectomy.  I then prepared the endplates.  I then placed a series of interbody trials and did feel a 12 mm interbody trial did the most appropriate fit.  At this point in time, I did make a separate incision over the patient's left iliac crest.  Total of 8 mL of bone marrow aspirate was harvested from the patient's left iliac crest using a Jamshidi needle.  This was mixed with 10 mL of Vitoss BA.  The Vitoss BA mixture was then mixed with the autograft obtained for removing the facet joint.  This was liberally packed into the intervertebral space. The interbody implant was also packed and tamped into position.  I was very pleased with the final appearance of the implant positioning on both AP and lateral fluoroscopic views.  At this point, I proceeded with placing 7 x 45 mm screws on the right.  A 35 mm rod was placed and caps were placed followed by a final locking procedure.  Of note, prior to locking the screws on the right, I did release the distraction on the left side.  Of note, I did test the screws on the right as well and there was no screw that tested below 10 milliamps.  I then performed a final locking procedure on both the right and the left sides.  I then used a high-speed bur to decorticate the transverse processes of L4-L5 on the left, in addition to the posterior elements.  The remainder of the bone graft mixture was packed into the posterior elements and the posterolateral gutter on the left side.  Of note, I did use  neurologic monitoring throughout the procedure and there was no abnormal EMG activity noted throughout the entirety of the procedure.  All of the dural bleeding was then controlled using bipolar electrocautery in addition to Surgiflo.  I then placed a Blake drain deep to the fascia. All bleeding was adequately controlled at this point.  I then proceeded with closure.  I closed the fascia using #1 Vicryl.  The subcutaneous layer was closed using 2-0 Vicryl and the skin was then closed using 3-0 Monocryl.  Benzoin and Steri-Strips were applied, followed by sterile dressing.  Of note, Jason Coop was my assistant throughout the entirety of the procedure and aided in essential  retraction and suctioning needed throughout the surgery.     Estill Bamberg, MD     MD/MEDQ  D:  04/23/2013  T:  04/24/2013  Job:  [id number]

## 2013-04-24 NOTE — Evaluation (Signed)
Physical Therapy Evaluation Patient Details Name: Devin Huffman MRN: [id number] DOB: [birth year] Today's Date: 04/24/2013 Time: 4098-1191 PT Time Calculation (min): 25 min  PT Assessment / Plan / Recommendation Clinical Impression  Pt. admitted with back and leg pain s/p work injury.  He has now undergone fusion L 4-5 and presents to PT with a decrease  in his functional mobility and gait, pain and has need for acute PT to address these and below issues.    PT Assessment  Patient needs continued PT services    Follow Up Recommendations  Home health PT;Other (comment);Supervision/Assistance - 24 hour (depends on how he progresses)    Does the patient have the potential to tolerate intense rehabilitation      Barriers to Discharge None      Equipment Recommendations  Rolling walker with 5" wheels    Recommendations for Other Services     Frequency Min 6X/week    Precautions / Restrictions Precautions Precautions: Back Precaution Booklet Issued: Yes (comment) Precaution Comments: pt,. educated on 3/3 back precautions and log rolling technique Restrictions Weight Bearing Restrictions: No   Pertinent Vitals/Pain See vitals tab       Mobility  Bed Mobility Bed Mobility: Rolling Left;Left Sidelying to Sit Rolling Left: 4: Min assist Right Sidelying to Sit: HOB flat Left Sidelying to Sit: 4: Min assist;HOB flat Details for Bed Mobility Assistance: cues to maintain back precautions and log rolling Transfers Transfers: Sit to Stand;Stand to Sit Sit to Stand: 4: Min assist;From bed;From chair/3-in-1 Stand to Sit: 4: Min assist;To chair/3-in-1 Details for Transfer Assistance: cues to maintain erect spine/ maintain back precautions and for hand placement and technique Ambulation/Gait Ambulation/Gait Assistance: 4: Min guard Ambulation Distance (Feet): 80 Feet Assistive device: Rolling walker Ambulation/Gait Assistance Details: cues for erect posture and to keep shoulders  relaxed and not overwork upper body Gait Pattern: Step-through pattern;Trunk flexed Gait velocity: decreased Stairs: No    Exercises     PT Diagnosis: Difficulty walking;Acute pain  PT Problem List: Decreased activity tolerance;Decreased knowledge of use of DME;Decreased knowledge of precautions;Pain PT Treatment Interventions: DME instruction;Gait training;Stair training;Functional mobility training;Therapeutic activities;Patient/family education   PT Goals Acute Rehab PT Goals PT Goal Formulation: With patient Time For Goal Achievement: 05/01/13 Potential to Achieve Goals: Good Pt will go Supine/Side to Sit: with modified independence PT Goal: Supine/Side to Sit - Progress: Goal set today Pt will go Sit to Supine/Side: with modified independence PT Goal: Sit to Supine/Side - Progress: Goal set today Pt will go Sit to Stand: with modified independence PT Goal: Sit to Stand - Progress: Goal set today Pt will go Stand to Sit: with modified independence PT Goal: Stand to Sit - Progress: Goal set today Pt will Ambulate: 51 - 150 feet;with least restrictive assistive device;with modified independence PT Goal: Ambulate - Progress: Goal set today Additional Goals Additional Goal #1: pt. will state and adhere to 3/3 back precautions and log rolling technique PT Goal: Additional Goal #1 - Progress: Goal set today  Visit Information  Last PT Received On: 04/24/13 Assistance Needed: +1 PT/OT Co-Evaluation/Treatment: Yes    Subjective Data  Subjective: Preseents supine in bed, ready to get OOB Patient Stated Goal: return to home without pain and resuming normal function   Prior Functioning  Home Living Lives With: Spouse Available Help at Discharge: Available 24 hours/day Type of Home: House Home Access: Stairs to enter [employer] of Steps: 2-3  post beside the steps   back steps have 5-6 but  has a rail Home Layout: One level Bathroom Shower/Tub: Architectural technologist: Handicapped height Bathroom Accessibility: Yes How Accessible: Accessible via walker Home Adaptive Equipment: Straight cane Prior Function Level of Independence: Independent Able to Take Stairs?: Yes Driving: Yes Vocation: Full time employment Communication Communication: No difficulties Dominant Hand: Right    Cognition  Cognition Arousal/Alertness: Awake/alert Behavior During Therapy: WFL for tasks assessed/performed Overall Cognitive Status: Within Functional Limits for tasks assessed    Extremity/Trunk Assessment Right Upper Extremity Assessment RUE ROM/Strength/Tone: WFL for tasks assessed RUE Sensation: WFL - Light Touch RUE Coordination: WFL - gross/fine motor Left Upper Extremity Assessment LUE ROM/Strength/Tone: WFL for tasks assessed LUE Sensation: WFL - Light Touch LUE Coordination: WFL - gross/fine motor Right Lower Extremity Assessment RLE ROM/Strength/Tone: WFL for tasks assessed RLE Sensation: WFL - Light Touch Left Lower Extremity Assessment LLE ROM/Strength/Tone: WFL for tasks assessed LLE Sensation: WFL - Light Touch Trunk Assessment Trunk Assessment: Normal   Balance Balance Balance Assessed: Yes Static Standing Balance Static Standing - Level of Assistance: 5: Stand by assistance  End of Session PT - End of Session Equipment Utilized During Treatment: Gait belt Activity Tolerance: Patient tolerated treatment well Patient left: in chair;with call bell/phone within reach Nurse Communication: Mobility status;Precautions  GP     Ferman Hamming 04/24/2013, 2:43 PM Weldon Picking PT Acute Rehab Services [phone] Beeper [phone]

## 2013-04-24 NOTE — Progress Notes (Signed)
Patient doing well overnight. + LBP, resolved leg pain.  BP 139/70  Pulse 84  Temp(Src) 99.1 F (37.3 C) (Oral)  Resp 18  SpO2 97%  Dressing CDI NVI  Drain output 190/10 hours  POD #1 after L4/5 decompression and fusion - maintain drain until at least tomorrow - up with PT/OT - d/c PCA and foley, start percocet and valium

## 2013-04-25 LAB — CBC
HCT: 34.1 % — ABNORMAL LOW (ref 39.0–52.0)
Platelets: 192 10*3/uL (ref 150–400)
RDW: 14.9 % (ref 11.5–15.5)
WBC: 14.4 10*3/uL — ABNORMAL HIGH (ref 4.0–10.5)

## 2013-04-25 LAB — GLUCOSE, CAPILLARY: Glucose-Capillary: 142 mg/dL — ABNORMAL HIGH (ref 70–99)

## 2013-04-25 NOTE — Progress Notes (Signed)
Physical Therapy Treatment Patient Details Name: Devin Huffman MRN: [id number] DOB: Sep 10, 1946 Today's Date: 04/25/2013 Time: 3086-5784 PT Time Calculation (min): 50 min  PT Assessment / Plan / Recommendation Comments on Treatment Session  Pt. felt well initially during walk, began to feel slightly dizzy and as if he would throw up (had mild heaving).  Pt. needed to be quickly assisted to chair from walking/steps and wheeled back to room in recliner chair.  Pt. began to feel somewhat better once seated.  RN Maralyn Sago in room.  Pt's blood sugar was 132, BP132/63.  O2 sats had dropped to 85% on room air but quickly increased to 95% once O2 reapplied.  No dyspnea or SOB noted.  Pt. is not ready for Dc today and Gaffer to call [place], [place] with this info.  Needs to tolerate mobility better before safe to DC home.    Follow Up Recommendations  Home health PT;Supervision/Assistance - 24 hour     Does the patient have the potential to tolerate intense rehabilitation     Barriers to Discharge        Equipment Recommendations  Rolling walker with 5" wheels    Recommendations for Other Services    Frequency Min 6X/week   Plan Discharge plan remains appropriate;Frequency remains appropriate    Precautions / Restrictions Precautions Precautions: Back Precaution Comments: pt. able to verbalize 3/3 back precautions; needed reminder about avoiding twisting Restrictions Weight Bearing Restrictions: No   Pertinent Vitals/Pain See vitals tab     Mobility  Bed Mobility Bed Mobility: Not assessed (up in recliner) Transfers Transfers: Sit to Stand Sit to Stand: 4: Min guard;From bed;From chair/3-in-1;With upper extremity assist;With armrests Stand to Sit: 4: Min guard;With upper extremity assist;With armrests;To chair/3-in-1 Details for Transfer Assistance: cues for back precautions and safe technique Ambulation/Gait Ambulation/Gait Assistance: 4: Min guard Ambulation Distance (Feet): 100  Feet Assistive device: Rolling walker Ambulation/Gait Assistance Details: occasional cue to stand erect and not lean forward Gait Pattern: Step-through pattern;Decreased stride length Gait velocity: decreased Stairs: Yes Stairs Assistance: 4: Min assist Stairs Assistance Details (indicate cue type and reason): vcs for step to pattern, min assist to steady Stair Management Technique: One rail Right;Forwards Number of Stairs: 3    Exercises     PT Diagnosis:    PT Problem List:   PT Treatment Interventions:     PT Goals Acute Rehab PT Goals Pt will go Sit to Stand: with modified independence PT Goal: Sit to Stand - Progress: Progressing toward goal Pt will go Stand to Sit: with modified independence PT Goal: Stand to Sit - Progress: Progressing toward goal Pt will Ambulate: 51 - 150 feet;with least restrictive assistive device;with modified independence PT Goal: Ambulate - Progress: Progressing toward goal Additional Goals Additional Goal #1: pt. will state and adhere to 3/3 back precautions and log rolling technique PT Goal: Additional Goal #1 - Progress: Progressing toward goal  Visit Information  Last PT Received On: 04/25/13 Assistance Needed: +2 (2 nd person needed in event of pt. feeling weak and dizzy )    Subjective Data  Subjective: Felt fine before ambulation, felt poorly after walking   Cognition  Cognition Arousal/Alertness: Awake/alert Behavior During Therapy: WFL for tasks assessed/performed Overall Cognitive Status: Within Functional Limits for tasks assessed    Balance     End of Session PT - End of Session Equipment Utilized During Treatment: Gait belt Activity Tolerance: Treatment limited secondary to medical complications (Comment) Patient left: in chair;with call bell/phone within  reach;with family/visitor present Nurse Communication: Mobility status;Other (comment) (pt. feeling poorly)   GP     Ferman Hamming 04/25/2013, 11:50 AM Weldon Picking PT Acute Rehab Services [phone] Beeper [phone]

## 2013-04-25 NOTE — Progress Notes (Signed)
2 Days S/P L4-5 Decompression and fusion, doing well. Resolved leg pain, moderate LBP controlled on oral px meds, was up with PT yesterday and making appropriate gains, eager to go home.  BP 126/73  Pulse 81  Temp(Src) 98.1 F (36.7 C) (Oral)  Resp 16  SpO2 97%  Pt sitting up in hospital bed comfortably eating breakfast, SCD's in place, negative Homans, 2+ DPP. Bandage CDI, drain in place. Drain removed w/o difficulty or resistance. NVI  Drain output 60 over last 12hrs per nursing  POD #2 after L4/5 decompression and fusion  - Drain removed w/o difficulty today - up with PT/OT  - D/C IV - D/C home today pending PT/OT  -Written prescriptions in the chart for percocet and valium - F/U appt made for in office in 2wks

## 2013-04-25 NOTE — Progress Notes (Signed)
Occupational Therapy Treatment Patient Details Name: Devin Huffman MRN: [id number] DOB: March 15, 1946 Today's Date: 04/25/2013 Time: 5621-3086 OT Time Calculation (min): 25 min  OT Assessment / Plan / Recommendation Comments on Treatment Session Pt continues to have diffilculty with tolerating activity due to clammy, dizzy feeling when he gets OOB.  Will continue to mobilize.  Recommended pt try to get back up and into the chair early afternoon.    Follow Up Recommendations  No OT follow up    Barriers to Discharge       Equipment Recommendations  3 in 1 bedside comode;Tub/shower seat    Recommendations for Other Services    Frequency Min 2X/week   Plan Discharge plan remains appropriate    Precautions / Restrictions Precautions Precautions: Back Precaution Comments: pt. able to verbalize 3/3 back precautions; needed reminder about avoiding twisting Restrictions Weight Bearing Restrictions: No   Pertinent Vitals/Pain Pt with 4/10 pain.  Had pain meds before treatment.  Pt very clammy and dizzy during treatment today.  Pt was off of O2 during PT.  O2 sats at return of PT were at 85%.  O2 replaced at 3L and it returned to 97%.  BP 132/63.  Nursing aware.    ADL  Toilet Transfer: Performed;Min guard Statistician Method: Surveyor, minerals: Materials engineer and Hygiene: Simulated;Supervision/safety Where Assessed - Engineer, mining and Hygiene: Sit to stand from 3-in-1 or toilet Equipment Used: Rolling walker;Gait belt Transfers/Ambulation Related to ADLs: Pt worked with PT this am and became very dizzy, clammy and felt as if he would pass out.  O2 sats 85% after PT when checked.  3L O2 put back on.  Nursing notified and pt requesting to return to bed.  BP normal. ADL Comments: Pt unable to participate in many adls today due to second episode of feeling clammy, dizzy etc.    OT Diagnosis:    OT Problem List:    OT Treatment Interventions:     OT Goals Acute Rehab OT Goals OT Goal Formulation: With patient Time For Goal Achievement: 05/01/13 Potential to Achieve Goals: Good ADL Goals Pt Will Perform Lower Body Dressing: with supervision;with adaptive equipment;Sit to stand from chair Pt Will Transfer to Toilet: with set-up;with DME;3-in-1;Maintaining back safety precautions ADL Goal: Toilet Transfer - Progress: Progressing toward goals Pt Will Perform Toileting - Clothing Manipulation: with supervision;Sitting on 3-in-1 or toilet;Standing ADL Goal: Toileting - Clothing Manipulation - Progress: Progressing toward goals Pt Will Perform Toileting - Hygiene: with supervision;with adaptive equipment;Sit to stand from 3-in-1/toilet ADL Goal: Toileting - Hygiene - Progress: Progressing toward goals Pt Will Perform Tub/Shower Transfer: Shower transfer;with DME;Ambulation;Maintaining back safety precautions Miscellaneous OT Goals Miscellaneous OT Goal #1: Pt will state 3/3 back precautions independently. OT Goal: Miscellaneous Goal #1 - Progress: Progressing toward goals Miscellaneous OT Goal #2: Pt will transition supine to sit EOB in preparation for selfcare tasks with supervision. OT Goal: Miscellaneous Goal #2 - Progress: Progressing toward goals  Visit Information  Last OT Received On: 04/25/13 Assistance Needed: +1 (2 nd person needed in event of pt. feeling weak and dizzy )    Subjective Data      Prior Functioning       Cognition  Cognition Arousal/Alertness: Awake/alert Behavior During Therapy: WFL for tasks assessed/performed Overall Cognitive Status: Within Functional Limits for tasks assessed    Mobility  Bed Mobility Bed Mobility: Not assessed;Sit to Supine (up in recliner) Sit to Supine: 4: Min guard;HOB flat;With rail  Details for Bed Mobility Assistance: cues to maintain back precautions and log rolling Transfers Transfers: Sit to Stand;Stand to Sit Sit to Stand: 4: Min  guard;From bed;From chair/3-in-1;With upper extremity assist;With armrests Stand to Sit: 4: Min guard;With upper extremity assist;With armrests;To chair/3-in-1 Details for Transfer Assistance: cues for back precautions and safe technique    Exercises      Balance Balance Balance Assessed: Yes Static Standing Balance Static Standing - Level of Assistance: 5: Stand by assistance   End of Session OT - End of Session Activity Tolerance: Treatment limited secondary to medical complications (Comment);Other (comment) (Pty dizzy, clammy and felt as if he would pass out.) Patient left: in bed;with call bell/phone within reach Nurse Communication: Mobility status;Precautions;Other (comment) (O2 sat of 85% w/o O2.)  GO     Hope Budds 04/25/2013, 11:54 AM [phone]

## 2013-04-26 ENCOUNTER — Encounter (HOSPITAL_COMMUNITY): Payer: Self-pay | Admitting: *Deleted

## 2013-04-26 MED ORDER — ONDANSETRON HCL 4 MG PO TABS
4.0000 mg | ORAL_TABLET | Freq: Three times a day (TID) | ORAL | Status: DC | PRN
  Administered 2013-04-26: 4 mg via ORAL
  Filled 2013-04-26: qty 1

## 2013-04-26 NOTE — Progress Notes (Signed)
Physical Therapy Treatment Patient Details Name: Devin Huffman MRN: [id number] DOB: 03/11/1946 Today's Date: 04/26/2013 Time: 0947-1020 PT Time Calculation (min): 33 min  PT Assessment / Plan / Recommendation Comments on Treatment Session  Patient still looking "green" and complaining of nausea and "sick" feeling. Patient VSS and sugar 133. RN made aware. Could possibly be pain meds. Kept ambulation to room as patient reviewed all yesterday. Encouraged to ambulate with staff if he starts feeling better.     Follow Up Recommendations  Home health PT;Supervision/Assistance - 24 hour     Does the patient have the potential to tolerate intense rehabilitation     Barriers to Discharge        Equipment Recommendations  Rolling walker with 5" wheels    Recommendations for Other Services    Frequency Min 6X/week   Plan Discharge plan remains appropriate;Frequency remains appropriate    Precautions / Restrictions Precautions Precautions: Back Precaution Comments: pt. able to verbalize 3/3 back precautions; needed reminder about avoiding twisting   Pertinent Vitals/Pain     Mobility  Bed Mobility Sit to Supine: 5: Supervision Transfers Sit to Stand: 4: Min guard;With armrests;From chair/3-in-1 Stand to Sit: 4: Min guard;To bed;To chair/3-in-1;With armrests Details for Transfer Assistance: cues for back precautions and safe technique Ambulation/Gait Ambulation/Gait Assistance: 4: Min guard Ambulation Distance (Feet): 30 Feet Gait Pattern: Step-through pattern Gait velocity: decreased    Exercises     PT Diagnosis:    PT Problem List:   PT Treatment Interventions:     PT Goals Acute Rehab PT Goals PT Goal: Sit to Supine/Side - Progress: Progressing toward goal PT Goal: Sit to Stand - Progress: Progressing toward goal PT Goal: Stand to Sit - Progress: Progressing toward goal PT Goal: Ambulate - Progress: Progressing toward goal Additional Goals PT Goal: Additional Goal  #1 - Progress: Progressing toward goal  Visit Information  Last PT Received On: 04/26/13    Subjective Data      Cognition  Cognition Arousal/Alertness: Awake/alert Behavior During Therapy: [facility] for tasks assessed/performed Overall Cognitive Status: Within Functional Limits for tasks assessed    Balance     End of Session PT - End of Session Equipment Utilized During Treatment: Gait belt Activity Tolerance: Treatment limited secondary to medical complications (Comment) Patient left: in chair;with call bell/phone within reach;with family/visitor present Nurse Communication: Mobility status;Other (comment)   GP     Fredrich Birks 04/26/2013, 10:26 AM 04/26/2013 Fredrich Birks PTA [phone] pager [phone] office

## 2013-04-26 NOTE — Progress Notes (Signed)
   CARE MANAGEMENT NOTE 04/26/2013  Patient:  Devin Huffman, Devin Huffman   Account Number:  [id number]  Date Initiated:  04/24/2013  Documentation initiated by:  Vance Peper  Subjective/Objective Assessment:   67 yr old male s/p Posterior Lumbar fusion     Action/Plan:   CM spoke with patient concerning DME and HH needs at discharge. He is under worker's comp.  Edrick Oh CM with [facility].  Fax [phone]  Ph: [phone]  CM faxed HH and DME orders to [facility]   Anticipated DC Date:  04/25/2013   Anticipated DC Plan:  HOME W HOME HEALTH SERVICES      DC Planning Services  CM consult      [facility] Choice  DURABLE MEDICAL EQUIPMENT  HOME HEALTH   Choice offered to / List presented to:     DME arranged  3-N-1  WALKER - ROLLING  SHOWER STOOL      DME agency  OTHER - SEE NOTE     HH arranged  HH-2 PT      HH agency  OTHER - SEE NOTE   Status of service:  Completed, signed off Medicare Important Message given?   (If response is "NO", the following Medicare IM given date fields will be blank) Date Medicare IM given:   Date Additional Medicare IM given:    Discharge Disposition:  HOME W HOME HEALTH SERVICES  Per UR Regulation:    If discussed at Long Length of Stay Meetings, dates discussed:    Comments:  04/26/2013 1500 NCM spoke to [place] to make aware of dc home today with HH. Isidoro Donning RN CCM Case Mgmt [phone]

## 2013-04-26 NOTE — Progress Notes (Signed)
Occupational Therapy Treatment Patient Details Name: Devin Huffman MRN: [id number] DOB: August 02, 1946 Today's Date: 04/26/2013 Time: 7829-5621 OT Time Calculation (min): 34 min  OT Assessment / Plan / Recommendation Comments on Treatment Session This 67 yo male s/p back surgery progressing slowly these last 2 days due to not feeling well (seems to have been related to pain meds). All education completed and all questions answered of pt and wife about BADLs. No further acute OT needs.    Follow Up Recommendations  No OT follow up       Equipment Recommendations  Tub/shower seat;3 in 1 bedside comode       Frequency Min 2X/week   Plan Discharge plan remains appropriate    Precautions / Restrictions Precautions Precautions: Back Precaution Comments: pt. able to verbalize 3/3 back precautions; needed reminder about avoiding twisting Restrictions Weight Bearing Restrictions: No   Pertinent Vitals/Pain Not feeling good, RN made aware    ADL  Tub/Shower Transfer: Performed;Supervision/safety Tub/Shower Transfer Method: Science writer: Shower seat with back (simulated for walk in shower, backing up to seat to sit down) Equipment Used: Rolling walker ADL Comments: AM: Pt stated that he just did not feel good. Took vitals--all OK, asked NT to check sugars since pt was cold and clammy--this was OK as well (132); Hgb is 11.3--fine--the only thing that we could possibly attribute it to was the pain meds. Advised him to only take one next time to see if this made a difference  (met him in hallway this PM headed out in a W/C and asked him how he was, he stated he felt much better and had not had any more pain meds since this morning)      OT Goals ADL Goals ADL Goal: Toilet Transfer - Progress: Progressing toward goals ADL Goal: Tub/Shower Transfer - Progress: Progressing toward goals Miscellaneous OT Goals OT Goal: Miscellaneous Goal #1 - Progress: Progressing  toward goals  Visit Information  Last OT Received On: 04/26/13 Assistance Needed: +1 PT/OT Co-Evaluation/Treatment: Yes (due to O2 sats yesterday)          Cognition  Cognition Arousal/Alertness: Awake/alert Behavior During Therapy: WFL for tasks assessed/performed Overall Cognitive Status: Within Functional Limits for tasks assessed    Mobility  Bed Mobility Details for Bed Mobility Assistance: Pt up in recliner upon arrival Transfers Transfers: Sit to Stand;Stand to Sit Sit to Stand: 4: Min guard;With armrests;From chair/3-in-1 Stand to Sit: 4: Min guard;To bed;To chair/3-in-1;With armrests Details for Transfer Assistance: cues for back precautions and safe technique          End of Session OT - End of Session Equipment Utilized During Treatment: Gait belt (RW) Activity Tolerance:  (limited due to pt just not feeling well) Patient left: in bed;with call bell/phone within reach;with family/visitor present Nurse Communication:  (that pt not feeling good)       Evette Georges  [phone]  04/26/2013, 3:54 PM

## 2013-04-26 NOTE — Progress Notes (Signed)
3 Days S/P L4-5 Decompression and fusion, doing well. Resolved leg pain, moderate LBP controlled on oral px meds, was up with PT yesterday, had a little episode of nausea that has not recurred, and making appropriate gains, eager to go home.  BP 127/76  Pulse 84  Temp(Src) 99.1 F (37.3 C) (Oral)  Resp 16  SpO2 97%  Pt sitting up in hospital bed comfortably eating breakfast, SCD's off, negative Homans, 2+ DPP. NVI  POD #3 after L4/5 decompression and fusion  - up with PT/OT  - D/C home today pending PT/OT  -Written prescriptions in the chart for percocet and valium - F/U appt made for in office in 2wks

## 2013-05-07 NOTE — Discharge Summary (Signed)
Patient ID: Devin Huffman MRN: [id number] DOB/AGE: 1946-11-16 67 y.o.  Admit date: 04/23/2013 Discharge date: 04/26/2013  Admission Diagnoses: Radiculopathy  Discharge Diagnoses: Improved S/P sx  Past Medical History  Diagnosis Date  . Hypertension   . Hyperlipidemia   . GERD (gastroesophageal reflux disease)   . Legg-Perthes disease     Right hip  . Fasting hyperglycemia     A1c 6.1%  . History of chicken pox   . Gout     ?due to L great toe pain, saw Dr Darrelyn Hillock  . Shortness of breath     with exertion    Surgeries: Procedure(s): POSTERIOR LUMBAR FUSION 1 LEVEL L4-5 on 04/24/19  Discharged Condition: Improved  Hospital Course: Devin Huffman is an 67 y.o. male who was admitted 04/23/2013 for operative treatment of radiculopathy. Patient has severe unremitting pain that affects sleep, daily activities, and work/hobbies. After pre-op clearance the patient was taken to the operating room on 04/23/2013 and underwent POSTERIOR LUMBAR FUSION 1 LEVEL L4-5.    Patient was given perioperative antibiotics:  Anti-infectives   Start     Dose/Rate Route Frequency Ordered Stop   04/23/13 2000  ceFAZolin (ANCEF) IVPB 1 g/50 mL premix     1 g 100 mL/hr over 30 Minutes Intravenous Every 8 hours 04/23/13 1953 04/24/13 0442   04/23/13 1630  ceFAZolin (ANCEF) IVPB 2 g/50 mL premix  Status:  Discontinued     2 g 100 mL/hr over 30 Minutes Intravenous To Surgery 04/23/13 1620 04/23/13 1953   04/23/13 0600  ceFAZolin (ANCEF) IVPB 2 g/50 mL premix     2 g 100 mL/hr over 30 Minutes Intravenous On call to O.R. 04/22/13 1444 04/23/13 1630       Patient was given sequential compression devices, early ambulation to prevent DVT.  Patient benefited maximally from hospital stay and there were no complications.    Recent vital signs: BP 103/63  Pulse 85  Temp(Src) 99.1 F (37.3 C) (Oral)  Resp 16  Ht 6' (1.829 m)  Wt 102.33 kg (225 lb 9.6 oz)  BMI 30.59 kg/m2  SpO2 93%    Discharge  Medications:     Medication List    STOP taking these medications       aspirin EC 81 MG tablet     ibuprofen 200 MG tablet  Commonly known as:  ADVIL,MOTRIN      TAKE these medications       amLODipine 5 MG tablet  Commonly known as:  NORVASC  Take 1 tablet (5 mg total) by mouth daily.     metoprolol succinate 25 MG 24 hr tablet  Commonly known as:  TOPROL-XL  Take 1 tablet (25 mg total) by mouth daily.     omeprazole 20 MG capsule  Commonly known as:  PRILOSEC  Take 20 mg by mouth daily.      Written scripts for percocet and valium sent home with the pt  Diagnostic Studies: Dg Chest 2 View  04/15/2013   *RADIOLOGY REPORT*  Clinical Data: Preoperative evaluation for lumbar fusion, history hypertension, GERD  CHEST - 2 VIEW  Comparison: 12/26/2009  Findings: Normal heart size and pulmonary vascularity. Mildly tortuous thoracic aorta. Linear subsegmental atelectasis versus scarring right lung base. Minimal pleural thickening left apex stable. No acute infiltrate, pleural effusion or pneumothorax. No acute osseous findings.  IMPRESSION: Minimal linear subsegmental atelectasis versus scarring right middle lobe. Otherwise negative exam.   Original Report Authenticated By: Ulyses Southward, M.D.   Dg  Lumbar Spine 2-3 Views  04/23/2013   *RADIOLOGY REPORT*  Clinical Data: L4-L5 fixation.  DG C-ARM 61-120 MIN,LUMBAR SPINE - 2-3 VIEW  Comparison:  Plain films of earlier today and the MRI of 03/18/2013  Findings: AP and lateral intraoperative views.  These demonstrate L4-L5 trans pedicle screw fixation bilaterally. No acute hardware complication.  IMPRESSION: Intraoperative imaging of L4-L5 fixation.   Original Report Authenticated By: Jeronimo Greaves, M.D.   Dg Lumbar Spine 1 View  04/23/2013   *RADIOLOGY REPORT*  Clinical Data: L4-5 disc herniation.  LUMBAR SPINE - 1 VIEW  Comparison: MRI dated 03/18/2013  Findings: Radiographs demonstrates needles at the levels of the spinous processes of L3 and  L5.  IMPRESSION: Needles at L3 and L5.   Original Report Authenticated By: Francene Boyers, M.D.   Dg C-arm [phone] Min  04/23/2013   *RADIOLOGY REPORT*  Clinical Data: L4-L5 fixation.  DG C-ARM 61-120 MIN,LUMBAR SPINE - 2-3 VIEW  Comparison:  Plain films of earlier today and the MRI of 03/18/2013  Findings: AP and lateral intraoperative views.  These demonstrate L4-L5 trans pedicle screw fixation bilaterally. No acute hardware complication.  IMPRESSION: Intraoperative imaging of L4-L5 fixation.   Original Report Authenticated By: Jeronimo Greaves, M.D.    Disposition: 06-Home-Health Care Svc        Follow-up Information   Follow up with Emilee Hero, MD.   Contact information:   [address] [place] [address] [phone]       Follow up with [facility]. (Home Health Physical Therapy)    Contact information:   [phone]     POD #3 after L4/5 decompression and fusion  - Cleared by PT/OT  - D/C home today  - Written prescriptions in the chart for percocet and valium  - F/U appt made for in office in 2wks  Signed: Georga Bora 05/07/2013, 6:48 PM

## 2013-10-20 ENCOUNTER — Other Ambulatory Visit: Payer: Self-pay | Admitting: Family Medicine

## 2013-10-23 ENCOUNTER — Ambulatory Visit: Payer: Self-pay

## 2013-11-04 ENCOUNTER — Ambulatory Visit (INDEPENDENT_AMBULATORY_CARE_PROVIDER_SITE_OTHER): Payer: 59 | Admitting: Family Medicine

## 2013-11-04 ENCOUNTER — Other Ambulatory Visit: Payer: Self-pay | Admitting: Family Medicine

## 2013-11-04 ENCOUNTER — Encounter: Payer: Self-pay | Admitting: Family Medicine

## 2013-11-04 VITALS — BP 144/84 | HR 72 | Temp 98.7°F | Ht 72.0 in | Wt 223.5 lb

## 2013-11-04 DIAGNOSIS — I10 Essential (primary) hypertension: Secondary | ICD-10-CM

## 2013-11-04 DIAGNOSIS — Z125 Encounter for screening for malignant neoplasm of prostate: Secondary | ICD-10-CM

## 2013-11-04 DIAGNOSIS — Z0000 Encounter for general adult medical examination without abnormal findings: Secondary | ICD-10-CM

## 2013-11-04 DIAGNOSIS — E782 Mixed hyperlipidemia: Secondary | ICD-10-CM

## 2013-11-04 DIAGNOSIS — R7309 Other abnormal glucose: Secondary | ICD-10-CM

## 2013-11-04 LAB — BASIC METABOLIC PANEL
CO2: 28 mEq/L (ref 19–32)
Calcium: 9.2 mg/dL (ref 8.4–10.5)
Creatinine, Ser: 1 mg/dL (ref 0.4–1.5)
Glucose, Bld: 108 mg/dL — ABNORMAL HIGH (ref 70–99)

## 2013-11-04 LAB — LIPID PANEL
Cholesterol: 232 mg/dL — ABNORMAL HIGH (ref 0–200)
Triglycerides: 240 mg/dL — ABNORMAL HIGH (ref 0.0–149.0)

## 2013-11-04 NOTE — Progress Notes (Signed)
Pre-visit discussion using our clinic review tool. No additional management support is needed unless otherwise documented below in the visit note.  

## 2013-11-04 NOTE — Assessment & Plan Note (Addendum)
I have personally reviewed the Medicare Annual Wellness questionnaire and have noted 1. The patient's medical and social history 2. Their use of alcohol, tobacco or illicit drugs 3. Their current medications and supplements 4. The patient's functional ability including ADL's, fall risks, home safety risks and hearing or visual impairment. 5. Diet and physical activity 6. Evidence for depression or mood disorders The patients weight, height, BMI have been recorded in the chart.  Hearing and vision has been addressed. I have made referrals, counseling and provided education to the patient based review of the above and I have provided the pt with a written personalized care plan for preventive services. See scanned questionairre. Advanced directives discussed: would want wife to be HCPOA.  Reviewed preventative protocols and updated unless pt declined. Will return for flu shot. Blood work today.

## 2013-11-04 NOTE — Progress Notes (Signed)
Subjective:    Patient ID: Devin Huffman, male    DOB: 19-Aug-1946, 67 y.o.   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  Hearing and vision screens passed. Past due for eye exam.  To schedule. Denies falls or depression/anhedonia, sadness.  Preventative:  Colon cancer screening - Dr. Russella Dar. 03/2011. 2 polyps - tubular adenoma, rpt 5 yrs. No family hx colon CA.  prostate cancer screening - last normal 2009. Would like to be checked next visit. Mild nocturia, strong stream.  Will return for flu shot. Tetanus 2010.  Pneumovax 2013 Shingles done 10/13/2013.  Advanced directives: does not want prolonged life support. Would want for reversible cause. Wife would be HCPOA.   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 No regular exercise.  Diet: "I love my ice cream. I can't live forever", some water   Medications and allergies reviewed and updated in chart.  Past histories reviewed and updated if relevant as below. Patient Active Problem List   Diagnosis Date Noted  . Altered sensation of foot 10/15/2012  . 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 Lip swelling 02/22/2012  . Healthcare maintenance 10/02/2011  . GERD (gastroesophageal reflux disease) 10/02/2011  . HYPERLIPIDEMIA, MIXED 07/15/2009  . GOUT 07/15/2009  . PREDIABETES 03/22/2009  . HYPERTENSION 06/23/2008  . Nonspecific (abnormal) findings on radiological and other examination of body structure 12/09/2007  . NONSPECIFIC ABNORM FIND RAD&OTH EXAM LUNG FIELD 12/09/2007   Past Medical History  Diagnosis Date  . Hypertension   . Hyperlipidemia   . GERD (gastroesophageal reflux disease)   . Legg-Perthes disease     Right hip  . Fasting hyperglycemia     A1c 6.1%  . History of chicken pox    . Gout     ?due to L great toe pain, saw Dr Darrelyn Hillock  . Shortness of breath     with exertion   Past Surgical History  Procedure Laterality Date  . Hernia repair      double hernia  . Tonsillectomy    . Wisdom tooth extraction    . Vasectomy    . Colonoscopy  03/2011    2 tubular adenomas, rec rpt 5 yrs  . Tonsillectomy    . 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 Smoking status: Never Smoker   . Smokeless tobacco: Never Used  . Alcohol Use: No   Family History  Problem Relation Age of Onset  . Breast cancer Mother     with recurrence  . Alzheimer's disease Father   . Healthy Sister   . Cancer Maternal Aunt     ?  Marland Kitchen Hypertension Maternal Uncle   . Stroke Maternal Uncle   . Coronary artery disease Paternal Uncle     MI  . Cancer Other     niece-2 types  . Stroke Maternal Grandfather    Allergies  Allergen Reactions  . Lisinopril Swelling    ?angioedema - lip swelling, hives   Current Outpatient Prescriptions on File Prior to Visit  Medication Sig Dispense Refill  . amLODipine (NORVASC) 5 MG tablet Take one tablet daily. **WILL NEED PHYSICAL  FOR FURTHER REFILLS**  30 tablet  0  . metoprolol succinate (TOPROL-XL) 25 MG 24 hr tablet Take 1 tablet (25 mg total) by mouth daily.  90 tablet  3  . omeprazole (PRILOSEC) 20 MG capsule Take 20 mg by mouth daily.      . [DISCONTINUED] lisinopril (PRINIVIL,ZESTRIL) 5 MG tablet Take 1 tablet (5 mg total) by mouth daily.  90 tablet  3   No current facility-administered medications on file prior to visit.     Review of Systems  Constitutional: Negative for fever, chills, activity change, appetite change, fatigue and unexpected weight change.  HENT: Negative for hearing loss.   Eyes: Negative for visual disturbance.  Respiratory: Negative for cough, chest tightness,  shortness of breath and wheezing.   Cardiovascular: Negative for chest pain, palpitations and leg swelling.  Gastrointestinal: Negative for nausea, vomiting, abdominal pain, diarrhea, constipation, blood in stool and abdominal distention.  Genitourinary: Negative for hematuria and difficulty urinating.  Musculoskeletal: Negative for arthralgias, myalgias and neck pain.  Skin: Negative for rash.  Neurological: Negative for dizziness, seizures, syncope and headaches.  Hematological: Negative for adenopathy. Does not bruise/bleed easily.  Psychiatric/Behavioral: Negative for dysphoric mood. The patient is not nervous/anxious.        Objective:   Physical Exam  Nursing note and vitals reviewed. Constitutional: He is oriented to person, place, and time. He appears well-developed and well-nourished. No distress.  HENT:  Head: Normocephalic and atraumatic.  Right Ear: Hearing, tympanic membrane, external ear and ear canal normal.  Left Ear: Hearing, tympanic membrane, external ear and ear canal normal.  Nose: Nose normal.  Mouth/Throat: Oropharynx is clear and moist. No oropharyngeal exudate.  Eyes: Conjunctivae and EOM are normal. Pupils are equal, round, and reactive to light. No scleral icterus.  Neck: Normal range of motion. Neck supple. Carotid bruit is not present. No thyromegaly present.  Cardiovascular: Normal rate, regular rhythm, normal heart sounds and intact distal pulses.   No murmur heard. Pulses:      Radial pulses are 2+ on the right side, and 2+ on the left side.  Pulmonary/Chest: Effort normal and breath sounds normal. No respiratory distress. He has no wheezes. He has no rales.  Abdominal: Soft. Bowel sounds are normal. He exhibits no distension and no mass. There is no tenderness. There is no rebound and no guarding.  Genitourinary: Rectum normal and prostate normal. Rectal exam shows no external hemorrhoid, no internal hemorrhoid, no fissure, no mass, no tenderness and  anal tone normal. Prostate is not enlarged (20gm) and not tender.  Skin tag left perianally  Musculoskeletal: Normal range of motion. He exhibits no edema.  Lymphadenopathy:    He has no cervical adenopathy.  Neurological: He is alert and oriented to person, place, and time.  CN grossly intact, station and gait intact  Skin: Skin is warm and dry. No rash noted.  Psychiatric: He has a normal mood and affect. His behavior is normal. Judgment and thought content normal.       Assessment & Plan:

## 2013-11-04 NOTE — Assessment & Plan Note (Signed)
Chronic, slightly elevated today.  Will continue to monitor on same meds.  Doesn't check at home;

## 2013-11-04 NOTE — Assessment & Plan Note (Signed)
Check FLP today. 

## 2013-11-04 NOTE — Assessment & Plan Note (Signed)
Check A1c today.

## 2013-11-04 NOTE — Patient Instructions (Addendum)
Return next week for flu shot or go to local pharmacy for flu shot starting next week after Dec. 3rd. Blood work today. Try to get more regular walking. Good to see you today, call us with questions.

## 2013-11-14 ENCOUNTER — Other Ambulatory Visit: Payer: Self-pay | Admitting: Family Medicine

## 2013-11-14 NOTE — Telephone Encounter (Signed)
Pt cking on status of refill; advised sent to [facility] [address]. Pt voiced understanding and will ck with pharmacy.

## 2013-11-24 ENCOUNTER — Other Ambulatory Visit: Payer: Self-pay

## 2013-11-24 MED ORDER — AMLODIPINE BESYLATE 5 MG PO TABS: ORAL_TABLET | ORAL | Status: DC

## 2013-11-24 NOTE — Telephone Encounter (Signed)
Pt left v/m requesting refill on 5 mg tab. Unable to reach pt and spoke with Mrs Blades she said it is pts BP med that is 5 mg and wants 90 day sent to [facility] [address]. Advised done.

## 2013-12-23 ENCOUNTER — Other Ambulatory Visit: Payer: Self-pay | Admitting: Dermatology

## 2014-05-09 ENCOUNTER — Other Ambulatory Visit: Payer: Self-pay | Admitting: Family Medicine

## 2014-05-09 DIAGNOSIS — R7309 Other abnormal glucose: Secondary | ICD-10-CM

## 2014-05-09 DIAGNOSIS — E782 Mixed hyperlipidemia: Secondary | ICD-10-CM

## 2014-05-11 ENCOUNTER — Ambulatory Visit (INDEPENDENT_AMBULATORY_CARE_PROVIDER_SITE_OTHER): Payer: Medicare HMO | Admitting: Family Medicine

## 2014-05-11 ENCOUNTER — Other Ambulatory Visit: Payer: 59

## 2014-05-11 ENCOUNTER — Encounter: Payer: Self-pay | Admitting: Family Medicine

## 2014-05-11 VITALS — BP 140/86 | HR 79 | Temp 98.5°F | Ht 72.0 in | Wt 231.0 lb

## 2014-05-11 DIAGNOSIS — R3915 Urgency of urination: Secondary | ICD-10-CM

## 2014-05-11 DIAGNOSIS — M109 Gout, unspecified: Secondary | ICD-10-CM

## 2014-05-11 DIAGNOSIS — R300 Dysuria: Secondary | ICD-10-CM

## 2014-05-11 LAB — POCT URINALYSIS DIPSTICK
Bilirubin, UA: NEGATIVE
Glucose, UA: NEGATIVE
KETONES UA: NEGATIVE
NITRITE UA: NEGATIVE
PH UA: 6
PROTEIN UA: NEGATIVE
Spec Grav, UA: <=1.005
UROBILINOGEN UA: 0.2

## 2014-05-11 MED ORDER — INDOMETHACIN 50 MG PO CAPS: 50.0000 mg | ORAL_CAPSULE | Freq: Three times a day (TID) | ORAL | Status: DC

## 2014-05-11 MED ORDER — CIPROFLOXACIN HCL 500 MG PO TABS: 500.0000 mg | ORAL_TABLET | Freq: Two times a day (BID) | ORAL | Status: DC

## 2014-05-11 NOTE — Progress Notes (Signed)
[place] [address] Phone: [phone] Fax: [phone]  Patient ID: Devin Huffman MRN: [id number], DOB: 09/04/1946, 68 y.o. Date of Encounter: 05/11/2014  Primary Physician:  Ria Bush, MD   Chief Complaint: Dysuria and Gout   Subjective:   History of Present Illness:  Devin Huffman is a 68 y.o. very pleasant male patient who presents with the following:  ? UTI. Felt some pain in the left testicle. Some dysuria. Some urgency. No STD exposure.    LEFT Hurting some in his heel. Started wednes night or Thursday morning.  Colchicine - that was a couple of years ago and had some  Friday, friend of his had some gout. Had some indomethacin. Took 2 pills and feels a little better.  No warmth or redness.  Past Medical History, Surgical History, Social History, Family History, Problem List, Medications, and Allergies have been reviewed and updated if relevant.  Review of Systems:  GEN: No acute illnesses, no fevers, chills. GI: No n/v/d, eating normally Pulm: No SOB Interactive and getting along well at home.  Otherwise, ROS is as per the HPI.  Objective:   Physical Examination: BP 140/86  Pulse 79  Temp(Src) 98.5 F (36.9 C) (Oral)  Ht 6' (1.829 m)  Wt 231 lb (104.781 kg)  BMI 31.32 kg/m2   GEN: WDWN, NAD, Non-toxic, Alert & Oriented x 3 HEENT: Atraumatic, Normocephalic.  Ears and Nose: No external deformity. EXTR: No clubbing/cyanosis/edema NEURO: Normal gait.  PSYCH: Normally interactive. Conversant. Not depressed or anxious appearing.  Calm demeanor.   Left foot and midfoot grossly nontender to palpation. Nontender from bony anatomy. Nontender the true ankle. Nontender on the medial and lateral malleolus. His mild tenderness at the Achilles tendon insertion. There is also some mild tenderness at the insertion of the plantar fascia on the calcaneus.  Laboratory and Imaging Data: Results for orders placed in visit on 05/11/14  POCT  URINALYSIS DIPSTICK      Result Value Ref Range   Color, UA straw     Clarity, UA clear     Glucose, UA negative     Bilirubin, UA negative     Ketones, UA negative     Spec Grav, UA <=1.005     Blood, UA large     pH, UA 6.0     Protein, UA negative     Urobilinogen, UA 0.2     Nitrite, UA negative     Leukocytes, UA small (1+)       Assessment & Plan:   Dysuria - Plan: POCT Urinalysis Dipstick, Urine culture  Urgency of urination  GOUT  UTI versus non-STD urethritis. Will treat as such and obtain a urine culture.  By clinical presentation, this appears to be less likely to be gout. He does not appear to be having intra-articular pain or discomfort. He treated himself with 2 doses of colchicine and 2 doses of indomethacin, and he seems to be improving. Regardless, I think it would be appropriate to give him a short round of indomethacin. If this does not improve his symptoms, then treatment more specific to Achilles tendinopathy or plantar fasciitis will be more appropriate.  Follow-up: No Follow-up on file. Unless noted above, the patient is to follow-up if symptoms worsen. Red flags were reviewed with the patient.  New Prescriptions   CIPROFLOXACIN (CIPRO) 500 MG TABLET    Take 1 tablet (500 mg total) by mouth 2 (two) times daily.   INDOMETHACIN (INDOCIN) 50  MG CAPSULE    Take 1 capsule (50 mg total) by mouth 3 (three) times daily with meals.   Orders Placed This Encounter  Procedures  . Urine culture  . POCT Urinalysis Dipstick    Signed,  Frederico Hamman T. Ilean Spradlin, MD, [facility]   Patient's Medications  New Prescriptions   CIPROFLOXACIN (CIPRO) 500 MG TABLET    Take 1 tablet (500 mg total) by mouth 2 (two) times daily.   INDOMETHACIN (INDOCIN) 50 MG CAPSULE    Take 1 capsule (50 mg total) by mouth 3 (three) times daily with meals.  Previous Medications   AMLODIPINE (NORVASC) 5 MG TABLET    Take one tablet by mouth daily.   ASPIRIN 81 MG TABLET    Take 81 mg  by mouth daily.   METOPROLOL SUCCINATE (TOPROL-XL) 25 MG 24 HR TABLET    TAKE ONE TABLET BY MOUTH EVERY DAY   OMEPRAZOLE (PRILOSEC) 20 MG CAPSULE    TAKE 1 CAPSULE BY MOUTH DAILY AS NEEDED.  Modified Medications   No medications on file  Discontinued Medications   No medications on file

## 2014-05-11 NOTE — Progress Notes (Signed)
Pre visit review using our clinic review tool, if applicable. No additional management support is needed unless otherwise documented below in the visit note. 

## 2014-05-12 LAB — URINE CULTURE
COLONY COUNT: NO GROWTH
Organism ID, Bacteria: NO GROWTH

## 2014-05-31 ENCOUNTER — Telehealth: Payer: Self-pay | Admitting: Family Medicine

## 2014-06-01 NOTE — Telephone Encounter (Signed)
Pt was previously given appt for med refill on 06/02/14; amlodipine was refilled on 05/31/14 for one year. Pt wants to know if still needs to keep appt on 06/02/14 or came at later appt. Pt request cb.

## 2014-06-01 NOTE — Telephone Encounter (Signed)
May cancel upcoming appt but would schedule medicare wellness visit for November/December.

## 2014-06-01 NOTE — Telephone Encounter (Signed)
Appt cancelled. Patient notified and medicare wellness visit with lab appt scheduled.

## 2014-06-02 ENCOUNTER — Ambulatory Visit: Payer: Self-pay | Admitting: Family Medicine

## 2014-08-24 ENCOUNTER — Other Ambulatory Visit: Payer: Self-pay | Admitting: Family Medicine

## 2014-08-26 ENCOUNTER — Other Ambulatory Visit: Payer: Self-pay | Admitting: Dermatology

## 2014-11-01 ENCOUNTER — Other Ambulatory Visit: Payer: Self-pay | Admitting: Family Medicine

## 2014-11-01 DIAGNOSIS — E782 Mixed hyperlipidemia: Secondary | ICD-10-CM

## 2014-11-01 DIAGNOSIS — Z125 Encounter for screening for malignant neoplasm of prostate: Secondary | ICD-10-CM

## 2014-11-01 DIAGNOSIS — R7303 Prediabetes: Secondary | ICD-10-CM

## 2014-11-01 DIAGNOSIS — I10 Essential (primary) hypertension: Secondary | ICD-10-CM

## 2014-11-01 DIAGNOSIS — M109 Gout, unspecified: Secondary | ICD-10-CM

## 2014-11-04 ENCOUNTER — Other Ambulatory Visit (INDEPENDENT_AMBULATORY_CARE_PROVIDER_SITE_OTHER): Payer: Medicare HMO

## 2014-11-04 DIAGNOSIS — Z125 Encounter for screening for malignant neoplasm of prostate: Secondary | ICD-10-CM

## 2014-11-04 DIAGNOSIS — E782 Mixed hyperlipidemia: Secondary | ICD-10-CM

## 2014-11-04 DIAGNOSIS — R7309 Other abnormal glucose: Secondary | ICD-10-CM

## 2014-11-04 DIAGNOSIS — M109 Gout, unspecified: Secondary | ICD-10-CM

## 2014-11-04 DIAGNOSIS — I10 Essential (primary) hypertension: Secondary | ICD-10-CM

## 2014-11-04 DIAGNOSIS — R7303 Prediabetes: Secondary | ICD-10-CM

## 2014-11-04 LAB — CBC WITH DIFFERENTIAL/PLATELET
Basophils Absolute: 0 10*3/uL (ref 0.0–0.1)
Basophils Relative: 0.3 % (ref 0.0–3.0)
EOS ABS: 0.2 10*3/uL (ref 0.0–0.7)
EOS PCT: 2.3 % (ref 0.0–5.0)
HCT: 43.7 % (ref 39.0–52.0)
Hemoglobin: 13.9 g/dL (ref 13.0–17.0)
LYMPHS PCT: 39 % (ref 12.0–46.0)
Lymphs Abs: 3.1 10*3/uL (ref 0.7–4.0)
MCHC: 31.8 g/dL (ref 30.0–36.0)
MCV: 85 fl (ref 78.0–100.0)
MONO ABS: 0.8 10*3/uL (ref 0.1–1.0)
Monocytes Relative: 10 % (ref 3.0–12.0)
NEUTROS PCT: 48.4 % (ref 43.0–77.0)
Neutro Abs: 3.8 10*3/uL (ref 1.4–7.7)
PLATELETS: 313 10*3/uL (ref 150.0–400.0)
RBC: 5.14 Mil/uL (ref 4.22–5.81)
RDW: 17.1 % — ABNORMAL HIGH (ref 11.5–15.5)
WBC: 7.9 10*3/uL (ref 4.0–10.5)

## 2014-11-04 LAB — LIPID PANEL
CHOL/HDL RATIO: 8
Cholesterol: 271 mg/dL — ABNORMAL HIGH (ref 0–200)
HDL: 35.3 mg/dL — ABNORMAL LOW (ref >39.00)
NONHDL: 235.7
TRIGLYCERIDES: 525 mg/dL — AB (ref 0.0–149.0)
VLDL: 105 mg/dL — ABNORMAL HIGH (ref 0.0–40.0)

## 2014-11-04 LAB — LDL CHOLESTEROL, DIRECT: Direct LDL: 131.7 mg/dL

## 2014-11-04 LAB — BASIC METABOLIC PANEL
BUN: 13 mg/dL (ref 6–23)
CHLORIDE: 102 meq/L (ref 96–112)
CO2: 28 mEq/L (ref 19–32)
Calcium: 9.3 mg/dL (ref 8.4–10.5)
Creatinine, Ser: 1.2 mg/dL (ref 0.4–1.5)
GFR: 64.86 mL/min (ref >60.00)
GLUCOSE: 113 mg/dL — AB (ref 70–99)
POTASSIUM: 4.9 meq/L (ref 3.5–5.1)
SODIUM: 138 meq/L (ref 135–145)

## 2014-11-04 LAB — PSA, MEDICARE: PSA: 1.35 ng/ml (ref 0.10–4.00)

## 2014-11-04 LAB — HEMOGLOBIN A1C: HEMOGLOBIN A1C: 6.9 % — AB (ref 4.6–6.5)

## 2014-11-04 LAB — URIC ACID: URIC ACID, SERUM: 8.6 mg/dL — AB (ref 4.0–7.8)

## 2014-11-11 ENCOUNTER — Encounter: Payer: Self-pay | Admitting: Family Medicine

## 2014-11-11 ENCOUNTER — Ambulatory Visit (INDEPENDENT_AMBULATORY_CARE_PROVIDER_SITE_OTHER): Payer: Medicare HMO | Admitting: Family Medicine

## 2014-11-11 VITALS — BP 136/72 | HR 84 | Temp 98.1°F | Ht 72.0 in | Wt 233.8 lb

## 2014-11-11 DIAGNOSIS — M109 Gout, unspecified: Secondary | ICD-10-CM

## 2014-11-11 DIAGNOSIS — Z23 Encounter for immunization: Secondary | ICD-10-CM

## 2014-11-11 DIAGNOSIS — I10 Essential (primary) hypertension: Secondary | ICD-10-CM

## 2014-11-11 DIAGNOSIS — E119 Type 2 diabetes mellitus without complications: Secondary | ICD-10-CM

## 2014-11-11 DIAGNOSIS — Z0000 Encounter for general adult medical examination without abnormal findings: Secondary | ICD-10-CM

## 2014-11-11 DIAGNOSIS — K219 Gastro-esophageal reflux disease without esophagitis: Secondary | ICD-10-CM

## 2014-11-11 DIAGNOSIS — Z7189 Other specified counseling: Secondary | ICD-10-CM

## 2014-11-11 DIAGNOSIS — E782 Mixed hyperlipidemia: Secondary | ICD-10-CM

## 2014-11-11 MED ORDER — OMEPRAZOLE 20 MG PO CPDR: DELAYED_RELEASE_CAPSULE | ORAL | Status: DC

## 2014-11-11 NOTE — Assessment & Plan Note (Signed)
Very elevated triglycerides - pt attributes to recent holiday diet . Recheck in 1 wk fasting labs. If persistently elevated, will discuss chol med.

## 2014-11-11 NOTE — Assessment & Plan Note (Signed)
Reviewed dx with patient, discussed diet changes and weight loss to help control sugars. rtc 6 mo for rpt labs and f/u visit for DM.

## 2014-11-11 NOTE — Assessment & Plan Note (Signed)
Chronic, stable. Continue regimen. 

## 2014-11-11 NOTE — Assessment & Plan Note (Signed)
Elevated uric acid but pt denies recurrent gout flares.

## 2014-11-11 NOTE — Assessment & Plan Note (Signed)
Breakthrough sxs if ppi not taken daily. Refilled script.

## 2014-11-11 NOTE — Patient Instructions (Addendum)
Flu and prevnar today. Advanced directive packet provided today. Good to see you today, call us with questions. Return in 1 week for lab visit to recheck cholesterol. Return in 6 months for fasting labs and afterwards for visit for diabetes. Work on decreased added sugars, sweetened beverages, and weight loss to help control sugars.

## 2014-11-11 NOTE — Assessment & Plan Note (Signed)

## 2014-11-11 NOTE — Addendum Note (Signed)
Addended by: Royann Shivers A on: 11/11/2014 10:02 AM   Modules accepted: Orders

## 2014-11-11 NOTE — Progress Notes (Signed)
Pre visit review using our clinic review tool, if applicable. No additional management support is needed unless otherwise documented below in the visit note. 

## 2014-11-11 NOTE — Assessment & Plan Note (Signed)
Advanced directives: does not want prolonged life support. Would want for reversible cause. Wife would be HCPOA.  Packet provided today.

## 2014-11-11 NOTE — Progress Notes (Signed)
BP 136/72 mmHg  Pulse 84  Temp(Src) 98.1 F (36.7 C) (Oral)  Ht 6' (1.829 m)  Wt 233 lb 12 oz (106.028 kg)  BMI 31.70 kg/m2   CC: medicare wellness visit  Subjective:    Patient ID: Devin Huffman, male    DOB: Nov 17, 1946, 68 y.o.   MRN: [id number]  HPI: Devin Huffman is a 68 y.o. male presenting on 11/11/2014 for Annual Exam   GERD - wife's insurance changed recently. Pt using zantac as well as prilosec.   Dyslipidemia - never started medication for cholesterol. Recent Trig 500s. Had just had thanksgiving meal.   DM - relatively new dx - with A1c 6.9% but currently diet controlled. Pt does not check sugars at home.  Hearing and vision screens passed.  Denies falls or depression/anhedonia, sadness.  Preventative: Colon cancer screening - Dr. Fuller Plan. 03/2011. 2 polyps - tubular adenoma, rpt 5 yrs. No family hx colon CA.  Prostate cancer screening - last normal 2009. Would like to be checked next visit. Mild nocturia, strong stream. Flu shot - today Tetanus 2010 Pneumovax 2013, prevnar today Shingles done 10/13/2013. Advanced directives: does not want prolonged life support. Would want for reversible cause. Wife would be HCPOA.   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 No regular exercise.  Diet: "I love my ice cream. I can't live forever", some water   Relevant past medical, surgical, family and social history reviewed and updated as indicated. Interim medical history since our last visit reviewed. Allergies and medications reviewed and updated.  Current Outpatient Prescriptions on File Prior to Visit  Medication Sig  . amLODipine (NORVASC) 5 MG tablet TAKE ONE TABLET BY MOUTH ONCE DAILY  . aspirin 81 MG tablet Take 81 mg by mouth daily.  . metoprolol succinate (TOPROL-XL) 25 MG 24 hr tablet TAKE ONE TABLET BY MOUTH ONCE DAILY    . indomethacin (INDOCIN) 50 MG capsule Take 1 capsule (50 mg total) by mouth 3 (three) times daily with meals. (Patient not taking: Reported on 11/11/2014)  . [DISCONTINUED] lisinopril (PRINIVIL,ZESTRIL) 5 MG tablet Take 1 tablet (5 mg total) by mouth daily.   No current facility-administered medications on file prior to visit.    Review of Systems Per HPI unless specifically indicated above     Objective:    BP 136/72 mmHg  Pulse 84  Temp(Src) 98.1 F (36.7 C) (Oral)  Ht 6' (1.829 m)  Wt 233 lb 12 oz (106.028 kg)  BMI 31.70 kg/m2  Wt Readings from Last 3 Encounters:  11/11/14 233 lb 12 oz (106.028 kg)  05/11/14 231 lb (104.781 kg)  11/04/13 223 lb 8 oz (101.379 kg)    Physical Exam  Constitutional: He is oriented to person, place, and time. He appears well-developed and well-nourished. No distress.  HENT:  Head: Normocephalic and atraumatic.  Right Ear: Hearing, tympanic membrane, external ear and ear canal normal.  Left Ear: Hearing, tympanic membrane, external ear and ear canal normal.  Nose: Nose normal.  Mouth/Throat: Uvula is midline, oropharynx is clear and moist and mucous membranes are normal. No oropharyngeal exudate, posterior oropharyngeal edema or posterior oropharyngeal erythema.  Eyes: Conjunctivae and EOM are normal. Pupils are equal, round, and reactive to light. No scleral icterus.  Neck: Normal range of motion. Neck supple. Carotid bruit is not present. No thyromegaly present.  Cardiovascular: Normal rate, regular rhythm, normal heart  sounds and intact distal pulses.   No murmur heard. Pulses:      Radial pulses are 2+ on the right side, and 2+ on the left side.  Pulmonary/Chest: Effort normal and breath sounds normal. No respiratory distress. He has no wheezes. He has no rales.  Abdominal: Soft. Bowel sounds are normal. He exhibits no distension and no mass. There is no tenderness. There is no rebound and no guarding.  Genitourinary: Rectum normal and  prostate normal. Rectal exam shows no external hemorrhoid, no internal hemorrhoid, no fissure, no mass, no tenderness and anal tone normal. Prostate is not enlarged (20gm) and not tender.  Musculoskeletal: Normal range of motion. He exhibits no edema.  Lymphadenopathy:    He has no cervical adenopathy.  Neurological: He is alert and oriented to person, place, and time.  CN grossly intact, station and gait intact Recall 3/3 calculation Calculation 5/5 serial 3s  Skin: Skin is warm and dry. No rash noted.  Psychiatric: He has a normal mood and affect. His behavior is normal. Judgment and thought content normal.  Nursing note and vitals reviewed.  Results for orders placed or performed in visit on 11/04/14  Lipid panel  Result Value Ref Range   Cholesterol 271 (H) 0 - 200 mg/dL   Triglycerides 525.0 (H) 0.0 - 149.0 mg/dL   HDL 35.30 (L) >39.00 mg/dL   VLDL 105.0 (H) 0.0 - 40.0 mg/dL   Total CHOL/HDL Ratio 8    NonHDL 235.70   Hemoglobin A1c  Result Value Ref Range   Hgb A1c MFr Bld 6.9 (H) 4.6 - 6.5 %  PSA, Medicare  Result Value Ref Range   PSA 1.35 0.10 - 4.00 ng/ml  CBC with Differential  Result Value Ref Range   WBC 7.9 4.0 - 10.5 K/uL   RBC 5.14 4.22 - 5.81 Mil/uL   Hemoglobin 13.9 13.0 - 17.0 g/dL   HCT 43.7 39.0 - 52.0 %   MCV 85.0 78.0 - 100.0 fl   MCHC 31.8 30.0 - 36.0 g/dL   RDW 17.1 (H) 11.5 - 15.5 %   Platelets 313.0 150.0 - 400.0 K/uL   Neutrophils Relative % 48.4 43.0 - 77.0 %   Lymphocytes Relative 39.0 12.0 - 46.0 %   Monocytes Relative 10.0 3.0 - 12.0 %   Eosinophils Relative 2.3 0.0 - 5.0 %   Basophils Relative 0.3 0.0 - 3.0 %   Neutro Abs 3.8 1.4 - 7.7 K/uL   Lymphs Abs 3.1 0.7 - 4.0 K/uL   Monocytes Absolute 0.8 0.1 - 1.0 K/uL   Eosinophils Absolute 0.2 0.0 - 0.7 K/uL   Basophils Absolute 0.0 0.0 - 0.1 K/uL  Basic metabolic panel  Result Value Ref Range   Sodium 138 135 - 145 mEq/L   Potassium 4.9 3.5 - 5.1 mEq/L   Chloride 102 96 - 112 mEq/L   CO2  28 19 - 32 mEq/L   Glucose, Bld 113 (H) 70 - 99 mg/dL   BUN 13 6 - 23 mg/dL   Creatinine, Ser 1.2 0.4 - 1.5 mg/dL   Calcium 9.3 8.4 - 10.5 mg/dL   GFR 64.86 >60.00 mL/min  Uric acid  Result Value Ref Range   Uric Acid, Serum 8.6 (H) 4.0 - 7.8 mg/dL  LDL cholesterol, direct  Result Value Ref Range   Direct LDL 131.7 mg/dL      Assessment & Plan:   Problem List Items Addressed This Visit    Medicare annual wellness visit, subsequent - Primary  I have personally reviewed the Medicare Annual Wellness questionnaire and have noted 1. The patient's medical and social history 2. Their use of alcohol, tobacco or illicit drugs 3. Their current medications and supplements 4. The patient's functional ability including ADL's, fall risks, home safety risks and hearing or visual impairment. 5. Diet and physical activity 6. Evidence for depression or mood disorders The patients weight, height, BMI have been recorded in the chart.  Hearing and vision has been addressed. I have made referrals, counseling and provided education to the patient based review of the above and I have provided the pt with a written personalized care plan for preventive services. Provider list updated - see scanned questionairre.  Reviewed preventative protocols and updated unless pt declined.     HYPERLIPIDEMIA, MIXED    Very elevated triglycerides - pt attributes to recent holiday diet . Recheck in 1 wk fasting labs. If persistently elevated, will discuss chol med.    Gout    Elevated uric acid but pt denies recurrent gout flares.    GERD (gastroesophageal reflux disease)    Breakthrough sxs if ppi not taken daily. Refilled script.    Relevant Medications      omeprazole (PRILOSEC) capsule   Essential hypertension    Chronic, stable. Continue regimen.    Diabetes mellitus type 2, controlled    Reviewed dx with patient, discussed diet changes and weight loss to help control sugars. rtc 6 mo for rpt labs and  f/u visit for DM.    Advanced care planning/counseling discussion    Advanced directives: does not want prolonged life support. Would want for reversible cause. Wife would be HCPOA.  Packet provided today.        Follow up plan: Return in about 6 months (around 05/13/2015), or as needed, for follow up visit.

## 2014-11-15 ENCOUNTER — Other Ambulatory Visit: Payer: Self-pay | Admitting: Family Medicine

## 2014-11-15 DIAGNOSIS — E782 Mixed hyperlipidemia: Secondary | ICD-10-CM

## 2014-11-18 ENCOUNTER — Other Ambulatory Visit: Payer: Self-pay

## 2014-11-28 ENCOUNTER — Other Ambulatory Visit: Payer: Self-pay | Admitting: Family Medicine

## 2014-12-11 DIAGNOSIS — J342 Deviated nasal septum: Secondary | ICD-10-CM

## 2014-12-11 DIAGNOSIS — H6120 Impacted cerumen, unspecified ear: Secondary | ICD-10-CM

## 2014-12-11 HISTORY — DX: Impacted cerumen, unspecified ear: H61.20

## 2014-12-11 HISTORY — DX: Deviated nasal septum: J34.2

## 2015-04-20 LAB — HM DIABETES EYE EXAM

## 2015-05-06 ENCOUNTER — Other Ambulatory Visit: Payer: Self-pay | Admitting: *Deleted

## 2015-05-06 MED ORDER — OMEPRAZOLE 20 MG PO CPDR: DELAYED_RELEASE_CAPSULE | ORAL | Status: DC

## 2015-05-12 ENCOUNTER — Other Ambulatory Visit: Payer: Self-pay | Admitting: Family Medicine

## 2015-05-12 ENCOUNTER — Other Ambulatory Visit (INDEPENDENT_AMBULATORY_CARE_PROVIDER_SITE_OTHER): Payer: Medicare HMO

## 2015-05-12 ENCOUNTER — Telehealth: Payer: Self-pay | Admitting: Family Medicine

## 2015-05-12 DIAGNOSIS — E119 Type 2 diabetes mellitus without complications: Secondary | ICD-10-CM

## 2015-05-12 DIAGNOSIS — E782 Mixed hyperlipidemia: Secondary | ICD-10-CM

## 2015-05-12 LAB — LDL CHOLESTEROL, DIRECT: LDL DIRECT: 127 mg/dL

## 2015-05-12 LAB — BASIC METABOLIC PANEL
BUN: 16 mg/dL (ref 6–23)
CO2: 28 meq/L (ref 19–32)
CREATININE: 1.06 mg/dL (ref 0.40–1.50)
Calcium: 9.1 mg/dL (ref 8.4–10.5)
Chloride: 106 mEq/L (ref 96–112)
GFR: 73.29 mL/min (ref >60.00)
GLUCOSE: 123 mg/dL — AB (ref 70–99)
POTASSIUM: 4.6 meq/L (ref 3.5–5.1)
SODIUM: 140 meq/L (ref 135–145)

## 2015-05-12 LAB — LIPID PANEL
CHOLESTEROL: 216 mg/dL — AB (ref 0–200)
HDL: 39.4 mg/dL (ref >39.00)
NonHDL: 176.6
Total CHOL/HDL Ratio: 5
Triglycerides: 268 mg/dL — ABNORMAL HIGH (ref 0.0–149.0)
VLDL: 53.6 mg/dL — ABNORMAL HIGH (ref 0.0–40.0)

## 2015-05-12 LAB — HEMOGLOBIN A1C: HEMOGLOBIN A1C: 6.4 % (ref 4.6–6.5)

## 2015-05-12 NOTE — Telephone Encounter (Signed)
Spoke with patient and advised that labs wouldn't be back until this afternoon and that he needed to keep appt. He verbalized understanding and asked that I reschedule appt. Changed per pt request.

## 2015-05-12 NOTE — Telephone Encounter (Signed)
Pt came in today for labs  He wanted to know if his labs were ok  Does he still need to come in 6/6 to see dr g

## 2015-05-14 ENCOUNTER — Encounter: Payer: Self-pay | Admitting: Family Medicine

## 2015-05-14 ENCOUNTER — Ambulatory Visit (INDEPENDENT_AMBULATORY_CARE_PROVIDER_SITE_OTHER): Payer: Medicare HMO | Admitting: Family Medicine

## 2015-05-14 VITALS — BP 134/82 | HR 72 | Temp 98.1°F | Wt 230.2 lb

## 2015-05-14 DIAGNOSIS — R208 Other disturbances of skin sensation: Secondary | ICD-10-CM

## 2015-05-14 DIAGNOSIS — E119 Type 2 diabetes mellitus without complications: Secondary | ICD-10-CM

## 2015-05-14 DIAGNOSIS — E782 Mixed hyperlipidemia: Secondary | ICD-10-CM

## 2015-05-14 DIAGNOSIS — E669 Obesity, unspecified: Secondary | ICD-10-CM | POA: Diagnosis not present

## 2015-05-14 DIAGNOSIS — I10 Essential (primary) hypertension: Secondary | ICD-10-CM | POA: Diagnosis not present

## 2015-05-14 DIAGNOSIS — R209 Unspecified disturbances of skin sensation: Secondary | ICD-10-CM

## 2015-05-14 DIAGNOSIS — E66811 Obesity, class 1: Secondary | ICD-10-CM | POA: Insufficient documentation

## 2015-05-14 MED ORDER — OMEPRAZOLE 20 MG PO CPDR: DELAYED_RELEASE_CAPSULE | ORAL | Status: DC

## 2015-05-14 MED ORDER — AMLODIPINE BESYLATE 5 MG PO TABS: 5.0000 mg | ORAL_TABLET | Freq: Every day | ORAL | Status: DC

## 2015-05-14 MED ORDER — METOPROLOL SUCCINATE ER 25 MG PO TB24: 25.0000 mg | ORAL_TABLET | Freq: Every day | ORAL | Status: DC

## 2015-05-14 NOTE — Patient Instructions (Addendum)
Work on cholesterol and sugar control. Blood pressure looking great today. I recommend non-weight bearing exercise like stationary bike or pool. Let me know if you'd like to try gabapentin for foot sensation. Good to see you today, call us with questions. Return in 6 months for wellness visit     Why follow it? Research shows. . Those who follow the Mediterranean diet have a reduced risk of heart disease  . The diet is associated with a reduced incidence of Parkinson's and Alzheimer's diseases . People following the diet may have longer life expectancies and lower rates of chronic diseases  . The Dietary Guidelines for Americans recommends the Mediterranean diet as an eating plan to promote health and prevent disease  What Is the Mediterranean Diet?  . Healthy eating plan based on typical foods and recipes of Mediterranean-style cooking . The diet is primarily a plant based diet; these foods should make up a majority of meals   Starches - Plant based foods should make up a majority of meals - They are an important sources of vitamins, minerals, energy, antioxidants, and fiber - Choose whole grains, foods high in fiber and minimally processed items  - Typical grain sources include wheat, oats, barley, corn, brown rice, bulgar, farro, millet, polenta, couscous  - Various types of beans include chickpeas, lentils, fava beans, black beans, white beans   Fruits  Veggies - Large quantities of antioxidant rich fruits & veggies; 6 or more servings  - Vegetables can be eaten raw or lightly drizzled with oil and cooked  - Vegetables common to the traditional Mediterranean Diet include: artichokes, arugula, beets, broccoli, brussel sprouts, cabbage, carrots, celery, collard greens, cucumbers, eggplant, kale, leeks, lemons, lettuce, mushrooms, okra, onions, peas, peppers, potatoes, pumpkin, radishes, rutabaga, shallots, spinach, sweet potatoes, turnips, zucchini - Fruits common to the Mediterranean Diet  include: apples, apricots, avocados, cherries, clementines, dates, figs, grapefruits, grapes, melons, nectarines, oranges, peaches, pears, pomegranates, strawberries, tangerines  Fats - Replace butter and margarine with healthy oils, such as olive oil, canola oil, and tahini  - Limit nuts to no more than a handful a day  - Nuts include walnuts, almonds, pecans, pistachios, pine nuts  - Limit or avoid candied, honey roasted or heavily salted nuts - Olives are central to the [employer] - can be eaten whole or used in a variety of dishes   Meats Protein - Limiting red meat: no more than a few times a month - When eating red meat: choose lean cuts and keep the portion to the size of deck of cards - Eggs: approx. 0 to 4 times a week  - Fish and lean poultry: at least 2 a week  - Healthy protein sources include, chicken, [place], lean beef, lamb - Increase intake of seafood such as tuna, salmon, trout, mackerel, shrimp, scallops - Avoid or limit high fat processed meats such as sausage and bacon  Dairy - Include moderate amounts of low fat dairy products  - Focus on healthy dairy such as fat free yogurt, skim milk, low or reduced fat cheese - Limit dairy products higher in fat such as whole or 2% milk, cheese, ice cream  Alcohol - Moderate amounts of red wine is ok  - No more than 5 oz daily for women (all ages) and men older than age 7  - No more than 10 oz of wine daily for men younger than 39  Other - Limit sweets and other desserts  - Use herbs and spices instead  of salt to flavor foods  - Herbs and spices common to the traditional Mediterranean Diet include: basil, bay leaves, chives, cloves, cumin, fennel, garlic, lavender, marjoram, mint, oregano, parsley, pepper, rosemary, sage, savory, sumac, tarragon, thyme   It's not just a diet, it's a lifestyle:  . The Mediterranean diet includes lifestyle factors typical of those in the region  . Foods, drinks and meals are best eaten with  others and savored . Daily physical activity is important for overall good health . This could be strenuous exercise like running and aerobics . This could also be more leisurely activities such as walking, housework, yard-work, or taking the stairs . Moderation is the key; a balanced and healthy diet accommodates most foods and drinks . Consider portion sizes and frequency of consumption of certain foods   Meal Ideas & Options:  . Breakfast:  o Whole wheat toast or whole wheat English muffins with peanut butter & hard boiled egg o Steel cut oats topped with apples & cinnamon and skim milk  o Fresh fruit: banana, strawberries, melon, berries, peaches  o Smoothies: strawberries, bananas, greek yogurt, peanut butter o Low fat greek yogurt with blueberries and granola  o Egg white omelet with spinach and mushrooms o Breakfast couscous: whole wheat couscous, apricots, skim milk, cranberries  . Sandwiches:  o Hummus and grilled vegetables (peppers, zucchini, squash) on whole wheat bread   o Grilled chicken on whole wheat pita with lettuce, tomatoes, cucumbers or tzatziki  o Tuna salad on whole wheat bread: tuna salad made with greek yogurt, olives, red peppers, capers, green onions o Garlic rosemary lamb pita: lamb sauted with garlic, rosemary, salt & pepper; add lettuce, cucumber, greek yogurt to pita - flavor with lemon juice and black pepper  . Seafood:  o Mediterranean grilled salmon, seasoned with garlic, basil, parsley, lemon juice and black pepper o Shrimp, lemon, and spinach whole-grain pasta salad made with low fat greek yogurt  o Seared scallops with lemon orzo  o Seared tuna steaks seasoned salt, pepper, coriander topped with tomato mixture of olives, tomatoes, olive oil, minced garlic, parsley, green onions and cappers  . Meats:  o Herbed greek chicken salad with kalamata olives, cucumber, feta  o Red bell peppers stuffed with spinach, bulgur, lean ground beef (or lentils) &  topped with feta   o Kebabs: skewers of chicken, tomatoes, onions, zucchini, squash  o [place] burgers: made with red onions, mint, dill, lemon juice, feta cheese topped with roasted red peppers . Vegetarian o Cucumber salad: cucumbers, artichoke hearts, celery, red onion, feta cheese, tossed in olive oil & lemon juice  o Hummus and whole grain pita points with a greek salad (lettuce, tomato, feta, olives, cucumbers, red onion) o Lentil soup with celery, carrots made with vegetable broth, garlic, salt and pepper  o Tabouli salad: parsley, bulgur, mint, scallions, cucumbers, tomato, radishes, lemon juice, olive oil, salt and pepper.

## 2015-05-14 NOTE — Progress Notes (Signed)
Pre visit review using our clinic review tool, if applicable. No additional management support is needed unless otherwise documented below in the visit note. 

## 2015-05-14 NOTE — Assessment & Plan Note (Signed)
Chronic, stable. Continue current regimen. 

## 2015-05-14 NOTE — Assessment & Plan Note (Signed)
Extensively discussed diabetes and sugar control. Improvement in A1c noted. Discussed healthy diet and lifestyle changes to keep glycemic control. Foot exam today.

## 2015-05-14 NOTE — Assessment & Plan Note (Signed)
Check B12 next labwork. No results found for: VITAMINB12  Anticipate related to diabetic neuropathy - discussed this. Pt declines gabapentin trial at this time

## 2015-05-14 NOTE — Assessment & Plan Note (Signed)
Discussed healthy diet and lifestyle changes to affect sustainable weight loss. Recommended mediterranean diet.

## 2015-05-14 NOTE — Progress Notes (Signed)
BP 134/82 mmHg  Pulse 72  Temp(Src) 98.1 F (36.7 C) (Oral)  Wt 230 lb 4 oz (104.441 kg)   CC: f/u visit DM Subjective:    Patient ID: Devin Huffman, male    DOB: Sep 02, 1946, 69 y.o.   MRN: [id number]  HPI: Devin Huffman is a 69 y.o. male presenting on 05/14/2015 for Follow-up   DM/prediabetes border - regularly does not check sugars.  Compliant with antihyperglycemic regimen which includes: diet controlled.  Denies hypoglycemic symptoms.  Some toe paresthesias. Last diabetic eye exam 3 wks ago.  Pneumovax: 2013.  Prevnar: 2015. Lab Results  Component Value Date   HGBA1C 6.4 05/12/2015   Diabetic Foot Exam - Simple   Simple Foot Form  Diabetic Foot exam was performed with the following findings:  Yes 05/14/2015 11:48 AM  Visual Inspection  See comments:  Yes  Sensation Testing  Intact to touch and monofilament testing bilaterally:  Yes  Pulse Check  Posterior Tibialis and Dorsalis pulse intact bilaterally:  Yes  Comments  onychomycotic nails      HLD - not on statin.   HTN - compliant with amlodipine 5mg  and metoprolol XL 25mg  daily. No HA, vision changes, CP/tightness, SOB, leg swelling.   Obesity - discussed healthy diet and lifestyle.   Relevant past medical, surgical, family and social history reviewed and updated as indicated. Interim medical history since our last visit reviewed. Allergies and medications reviewed and updated. Current Outpatient Prescriptions on File Prior to Visit  Medication Sig  . aspirin 81 MG tablet Take 81 mg by mouth daily.  . indomethacin (INDOCIN) 50 MG capsule Take 1 capsule (50 mg total) by mouth 3 (three) times daily with meals.  . [DISCONTINUED] lisinopril (PRINIVIL,ZESTRIL) 5 MG tablet Take 1 tablet (5 mg total) by mouth daily.   No current facility-administered medications on file prior to visit.    Review of Systems Per HPI unless specifically indicated above     Objective:    BP 134/82 mmHg  Pulse 72  Temp(Src) 98.1  F (36.7 C) (Oral)  Wt 230 lb 4 oz (104.441 kg)  Wt Readings from Last 3 Encounters:  05/14/15 230 lb 4 oz (104.441 kg)  11/11/14 233 lb 12 oz (106.028 kg)  05/11/14 231 lb (104.781 kg)   Body mass index is 31.22 kg/(m^2).  Physical Exam  Constitutional: He appears well-developed and well-nourished. No distress.  HENT:  Head: Normocephalic and atraumatic.  Right Ear: External ear normal.  Left Ear: External ear normal.  Nose: Nose normal.  Mouth/Throat: Oropharynx is clear and moist. No oropharyngeal exudate.  Eyes: Conjunctivae and EOM are normal. Pupils are equal, round, and reactive to light. No scleral icterus.  Neck: Normal range of motion. Neck supple.  Cardiovascular: Normal rate, regular rhythm, normal heart sounds and intact distal pulses.   No murmur heard. Pulmonary/Chest: Effort normal and breath sounds normal. No respiratory distress. He has no wheezes. He has no rales.  Musculoskeletal: He exhibits no edema.  See HPI for foot exam if done  Lymphadenopathy:    He has no cervical adenopathy.  Skin: Skin is warm and dry. No rash noted.  Psychiatric: He has a normal mood and affect.  Nursing note and vitals reviewed.  Results for orders placed or performed in visit on 05/14/15  HM DIABETES EYE EXAM  Result Value Ref Range   HM Diabetic Eye Exam No Retinopathy No Retinopathy      Assessment & Plan:   Problem List  Items Addressed This Visit    Altered sensation of foot    Check B12 next labwork. No results found for: VITAMINB12  Anticipate related to diabetic neuropathy - discussed this. Pt declines gabapentin trial at this time      Diabetes mellitus type 2, controlled    Extensively discussed diabetes and sugar control. Improvement in A1c noted. Discussed healthy diet and lifestyle changes to keep glycemic control. Foot exam today.      Essential hypertension    Chronic, stable. Continue current regimen.      Relevant Medications   metoprolol  succinate (TOPROL-XL) 25 MG 24 hr tablet   amLODipine (NORVASC) 5 MG tablet   HYPERLIPIDEMIA, MIXED - Primary    Discussed with patient and wife - improved #s. Discussed extensively diet changes to affect improvement in #s.      Relevant Medications   metoprolol succinate (TOPROL-XL) 25 MG 24 hr tablet   amLODipine (NORVASC) 5 MG tablet   Obesity (BMI 30.0-34.9)    Discussed healthy diet and lifestyle changes to affect sustainable weight loss. Recommended mediterranean diet.          Follow up plan: Return in about 6 months (around 11/13/2015), or as needed, for medicare wellness.

## 2015-05-14 NOTE — Assessment & Plan Note (Signed)
Discussed with patient and wife - improved #s. Discussed extensively diet changes to affect improvement in #s.

## 2015-05-17 ENCOUNTER — Ambulatory Visit: Payer: Self-pay | Admitting: Family Medicine

## 2015-06-04 ENCOUNTER — Encounter: Payer: Self-pay | Admitting: Gastroenterology

## 2015-06-24 ENCOUNTER — Encounter: Payer: Self-pay | Admitting: Family Medicine

## 2015-11-12 ENCOUNTER — Other Ambulatory Visit (INDEPENDENT_AMBULATORY_CARE_PROVIDER_SITE_OTHER): Payer: Medicare HMO

## 2015-11-12 ENCOUNTER — Other Ambulatory Visit: Payer: Self-pay | Admitting: Family Medicine

## 2015-11-12 DIAGNOSIS — R208 Other disturbances of skin sensation: Secondary | ICD-10-CM

## 2015-11-12 DIAGNOSIS — M109 Gout, unspecified: Secondary | ICD-10-CM

## 2015-11-12 DIAGNOSIS — E119 Type 2 diabetes mellitus without complications: Secondary | ICD-10-CM

## 2015-11-12 DIAGNOSIS — E782 Mixed hyperlipidemia: Secondary | ICD-10-CM | POA: Diagnosis not present

## 2015-11-12 DIAGNOSIS — I10 Essential (primary) hypertension: Secondary | ICD-10-CM

## 2015-11-12 DIAGNOSIS — Z125 Encounter for screening for malignant neoplasm of prostate: Secondary | ICD-10-CM | POA: Diagnosis not present

## 2015-11-12 DIAGNOSIS — R209 Unspecified disturbances of skin sensation: Secondary | ICD-10-CM

## 2015-11-12 LAB — BASIC METABOLIC PANEL
BUN: 16 mg/dL (ref 6–23)
CALCIUM: 9.5 mg/dL (ref 8.4–10.5)
CO2: 29 mEq/L (ref 19–32)
CREATININE: 1.1 mg/dL (ref 0.40–1.50)
Chloride: 102 mEq/L (ref 96–112)
GFR: 70.12 mL/min (ref >60.00)
Glucose, Bld: 120 mg/dL — ABNORMAL HIGH (ref 70–99)
Potassium: 5.1 mEq/L (ref 3.5–5.1)
SODIUM: 141 meq/L (ref 135–145)

## 2015-11-12 LAB — LIPID PANEL
CHOL/HDL RATIO: 6
CHOLESTEROL: 244 mg/dL — AB (ref 0–200)
HDL: 38.7 mg/dL — ABNORMAL LOW (ref >39.00)
NONHDL: 205.18
Triglycerides: 374 mg/dL — ABNORMAL HIGH (ref 0.0–149.0)
VLDL: 74.8 mg/dL — AB (ref 0.0–40.0)

## 2015-11-12 LAB — HEMOGLOBIN A1C: HEMOGLOBIN A1C: 6 % (ref 4.6–6.5)

## 2015-11-12 LAB — URIC ACID: URIC ACID, SERUM: 8.7 mg/dL — AB (ref 4.0–7.8)

## 2015-11-12 LAB — LDL CHOLESTEROL, DIRECT: LDL DIRECT: 142 mg/dL

## 2015-11-12 LAB — PSA: PSA: 1.4 ng/mL (ref 0.10–4.00)

## 2015-11-12 LAB — VITAMIN B12: VITAMIN B 12: 181 pg/mL — AB (ref 211–911)

## 2015-11-17 ENCOUNTER — Encounter: Payer: Self-pay | Admitting: Family Medicine

## 2015-11-17 ENCOUNTER — Ambulatory Visit (INDEPENDENT_AMBULATORY_CARE_PROVIDER_SITE_OTHER): Payer: Medicare HMO | Admitting: Family Medicine

## 2015-11-17 VITALS — BP 122/84 | HR 68 | Temp 98.2°F | Ht 72.0 in | Wt 220.2 lb

## 2015-11-17 DIAGNOSIS — K219 Gastro-esophageal reflux disease without esophagitis: Secondary | ICD-10-CM

## 2015-11-17 DIAGNOSIS — E785 Hyperlipidemia, unspecified: Secondary | ICD-10-CM | POA: Diagnosis not present

## 2015-11-17 DIAGNOSIS — I10 Essential (primary) hypertension: Secondary | ICD-10-CM

## 2015-11-17 DIAGNOSIS — R7303 Prediabetes: Secondary | ICD-10-CM | POA: Diagnosis not present

## 2015-11-17 DIAGNOSIS — Z23 Encounter for immunization: Secondary | ICD-10-CM | POA: Diagnosis not present

## 2015-11-17 DIAGNOSIS — E538 Deficiency of other specified B group vitamins: Secondary | ICD-10-CM

## 2015-11-17 DIAGNOSIS — Z7189 Other specified counseling: Secondary | ICD-10-CM | POA: Diagnosis not present

## 2015-11-17 DIAGNOSIS — E663 Overweight: Secondary | ICD-10-CM

## 2015-11-17 DIAGNOSIS — Z0000 Encounter for general adult medical examination without abnormal findings: Secondary | ICD-10-CM

## 2015-11-17 DIAGNOSIS — M109 Gout, unspecified: Secondary | ICD-10-CM

## 2015-11-17 DIAGNOSIS — Z1159 Encounter for screening for other viral diseases: Secondary | ICD-10-CM | POA: Diagnosis not present

## 2015-11-17 NOTE — Progress Notes (Signed)
BP 122/84 mmHg  Pulse 68  Temp(Src) 98.2 F (36.8 C) (Oral)  Ht 6' (1.829 m)  Wt 220 lb 4 oz (99.905 kg)  BMI 29.86 kg/m2   CC: medicare wellness visit  Subjective:    Patient ID: Devin Huffman, male    DOB: 1946/08/25, 69 y.o.   MRN: [id number]  HPI: Devin Huffman is a 69 y.o. male presenting on 11/17/2015 for Annual Exam   Has started avoiding sweets. 10 lb weight loss noted.  Denies paresthesias but feet at times feel like he's walking on "bubble wrap".  Not on statin despite persistent dyslipidemia.  Hearing screen passed.  Vision screen with eye doctor. Denies falls or depression/anhedonia, sadness.  Preventative: Colon cancer screening - Dr. Fuller Plan. 03/2011. 2 polyps - tubular adenoma, rpt 5 yrs. No family hx colon CA.  Prostate cancer screening - discussed. PSA yearly, DRE every few years. Mild nocturia, strong stream. Flu shot - today Tetanus 2010 Pneumovax 2013, prevnar 2015 Shingles done 10/13/2013. Advanced directives: does not want prolonged life support. Would want for reversible cause. Wife would be HCPOA. Packet provided today. Seat belt use discussed Sunscreen use discussed. No changing moles on skin.  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 No regular exercise.  Diet: "I love my ice cream. I can't live forever", some water   Relevant past medical, surgical, family and social history reviewed and updated as indicated. Interim medical history since our last visit reviewed. Allergies and medications reviewed and updated. Current Outpatient Prescriptions on File Prior to Visit  Medication Sig  . amLODipine (NORVASC) 5 MG tablet Take 1 tablet (5 mg total) by mouth daily.  Marland Kitchen aspirin 81 MG tablet Take 81 mg by mouth daily.  . indomethacin (INDOCIN) 50 MG capsule Take 1 capsule (50 mg total) by mouth 3 (three) times  daily with meals.  . metoprolol succinate (TOPROL-XL) 25 MG 24 hr tablet Take 1 tablet (25 mg total) by mouth daily.  Marland Kitchen omeprazole (PRILOSEC) 20 MG capsule TAKE 1 CAPSULE BY MOUTH DAILY AS NEEDED.  . [DISCONTINUED] lisinopril (PRINIVIL,ZESTRIL) 5 MG tablet Take 1 tablet (5 mg total) by mouth daily.   No current facility-administered medications on file prior to visit.    Review of Systems Per HPI unless specifically indicated in ROS section     Objective:    BP 122/84 mmHg  Pulse 68  Temp(Src) 98.2 F (36.8 C) (Oral)  Ht 6' (1.829 m)  Wt 220 lb 4 oz (99.905 kg)  BMI 29.86 kg/m2  Wt Readings from Last 3 Encounters:  11/17/15 220 lb 4 oz (99.905 kg)  05/14/15 230 lb 4 oz (104.441 kg)  11/11/14 233 lb 12 oz (106.028 kg)    Physical Exam  Constitutional: He is oriented to person, place, and time. He appears well-developed and well-nourished. No distress.  HENT:  Head: Normocephalic and atraumatic.  Right Ear: Hearing, tympanic membrane, external ear and ear canal normal.  Left Ear: Hearing, tympanic membrane, external ear and ear canal normal.  Nose: Nose normal.  Mouth/Throat: Uvula is midline, oropharynx is clear and moist and mucous membranes are normal. No oropharyngeal exudate, posterior oropharyngeal edema or posterior oropharyngeal erythema.  Eyes: Conjunctivae and EOM are normal. Pupils are equal, round, and reactive to light. No scleral icterus.  Neck: Normal range of motion. Neck supple. Carotid bruit is not present. No thyromegaly present.  Cardiovascular: Normal rate, regular rhythm, normal heart sounds and intact distal pulses.   No murmur heard. Pulses:      Radial pulses are 2+ on the right side, and 2+ on the left side.  Pulmonary/Chest: Effort normal and breath sounds normal. No respiratory distress. He has no wheezes. He has no rales.  Abdominal: Soft. Bowel sounds are normal. He exhibits no distension and no mass. There is no tenderness. There is no rebound and  no guarding.  Musculoskeletal: Normal range of motion. He exhibits no edema.  Lymphadenopathy:    He has no cervical adenopathy.  Neurological: He is alert and oriented to person, place, and time.  CN grossly intact, station and gait intact Recall 3/3  Calculation 5/5 serial 3s. Trouble with serial 7s  Skin: Skin is warm and dry. No rash noted.  Psychiatric: He has a normal mood and affect. His behavior is normal. Judgment and thought content normal.  Nursing note and vitals reviewed.  Results for orders placed or performed in visit on 11/12/15  Lipid panel  Result Value Ref Range   Cholesterol 244 (H) 0 - 200 mg/dL   Triglycerides 374.0 (H) 0.0 - 149.0 mg/dL   HDL 38.70 (L) >39.00 mg/dL   VLDL 74.8 (H) 0.0 - 40.0 mg/dL   Total CHOL/HDL Ratio 6    NonHDL 205.18   PSA  Result Value Ref Range   PSA 1.40 0.10 - 4.00 ng/mL  Hemoglobin A1c  Result Value Ref Range   Hgb A1c MFr Bld 6.0 4.6 - 6.5 %  Basic metabolic panel  Result Value Ref Range   Sodium 141 135 - 145 mEq/L   Potassium 5.1 3.5 - 5.1 mEq/L   Chloride 102 96 - 112 mEq/L   CO2 29 19 - 32 mEq/L   Glucose, Bld 120 (H) 70 - 99 mg/dL   BUN 16 6 - 23 mg/dL   Creatinine, Ser 1.10 0.40 - 1.50 mg/dL   Calcium 9.5 8.4 - 10.5 mg/dL   GFR 70.12 >60.00 mL/min  Uric acid  Result Value Ref Range   Uric Acid, Serum 8.7 (H) 4.0 - 7.8 mg/dL  Vitamin B12  Result Value Ref Range   Vitamin B-12 181 (L) 211 - 911 pg/mL  LDL cholesterol, direct  Result Value Ref Range   Direct LDL 142.0 mg/dL      Assessment & Plan:   Problem List Items Addressed This Visit    Vitamin B12 deficiency    Reviewed with patient. Pt declines IM B12 shots, will start 1092mcg daily and recheck at 6 mo labs      Relevant Orders   Vitamin B12   Prediabetes    With weight loss, back in prediabetic range. Congratulated. Discussed avoidance of added sugars in diet.      Overweight (BMI 25.0-29.9)    Congratulated on 10lb weight loss noted.        Medicare annual wellness visit, subsequent - Primary    I have personally reviewed the Medicare Annual Wellness questionnaire and have noted 1. The patient's medical and social history 2. Their use of alcohol, tobacco or illicit drugs 3. Their current medications and supplements 4. The patient's functional ability including ADL's, fall risks, home safety risks and hearing or visual impairment. Cognitive function has been assessed and addressed as indicated.  5. Diet and physical activity 6. Evidence for depression or mood disorders The patients weight, height, BMI have been recorded in the chart. I have made referrals, counseling and provided  education to the patient based on review of the above and I have provided the pt with a written personalized care plan for preventive services. Provider list updated.. See scanned questionairre as needed for further documentation. Reviewed preventative protocols and updated unless pt declined.       Gout    Uric acid above goal but will treat according to symptoms based on latest gout treatment guidelines. Rare indocin use. Pt will notify me if recurrent gout flares >1/year to start urate lowering medication.       GERD (gastroesophageal reflux disease)    Controlled on daily PPI.      Essential hypertension    Chronic, stable. Continue current regimen.      Relevant Orders   Microalbumin / creatinine urine ratio   Basic metabolic panel   Dyslipidemia    Chronic, deteriorated. He did not start statin I prescribed last visit.  Again reviewed diet changes to improve #s.  Also recommended start fish oil 2000mg  daily. Recheck levels in 6 months and decide on statin then.       Relevant Orders   Lipid panel   Advanced care planning/counseling discussion    Advanced directives: does not want prolonged life support. Would want for reversible cause. Wife would be HCPOA. Packet provided today.       Other Visit Diagnoses    Need for influenza  vaccination        Relevant Orders    Flu Vaccine QUAD 36+ mos PF IM (Fluarix & Fluzone Quad PF) (Completed)    Need for hepatitis C screening test        Relevant Orders    Hepatitis C antibody, reflex        Follow up plan: Return in about 1 year (around 11/16/2016), or as needed, for medicare wellness visit.

## 2015-11-17 NOTE — Assessment & Plan Note (Signed)
With weight loss, back in prediabetic range. Congratulated. Discussed avoidance of added sugars in diet.

## 2015-11-17 NOTE — Assessment & Plan Note (Signed)
Reviewed with patient. Pt declines IM B12 shots, will start 1031mcg daily and recheck at 6 mo labs

## 2015-11-17 NOTE — Assessment & Plan Note (Addendum)
Advanced directives: does not want prolonged life support. Would want for reversible cause. Wife would be HCPOA.  Packet provided today. 

## 2015-11-17 NOTE — Assessment & Plan Note (Signed)

## 2015-11-17 NOTE — Assessment & Plan Note (Signed)
Congratulated on 10lb weight loss noted.

## 2015-11-17 NOTE — Assessment & Plan Note (Signed)
Uric acid above goal but will treat according to symptoms based on latest gout treatment guidelines. Rare indocin use. Pt will notify me if recurrent gout flares >1/year to start urate lowering medication.

## 2015-11-17 NOTE — Progress Notes (Signed)
Pre visit review using our clinic review tool, if applicable. No additional management support is needed unless otherwise documented below in the visit note. 

## 2015-11-17 NOTE — Assessment & Plan Note (Signed)
Chronic, deteriorated. He did not start statin I prescribed last visit.  Again reviewed diet changes to improve #s.  Also recommended start fish oil 2000mg  daily. Recheck levels in 6 months and decide on statin then.

## 2015-11-17 NOTE — Assessment & Plan Note (Signed)
Controlled on daily PPI 

## 2015-11-17 NOTE — Patient Instructions (Addendum)
Flu shot today. Advanced directive packet provided today. Return in 6 months for lab visit only.  Start b12 1062mcg daily. Decrease added sugars, eliminate trans fats, increase fiber and limit alcohol.  All these changes together can drop triglycerides by almost 50%. If not increasing fatty fish in diet, then start 2 tablets of fish oil daily.  If recurrent gout flare, let me know for new gout lowering medicine.  Congratulations on weight loss!   Health Maintenance, Male A healthy lifestyle and preventative care can promote health and wellness.  Maintain regular health, dental, and eye exams.  Eat a healthy diet. Foods like vegetables, fruits, whole grains, low-fat dairy products, and lean protein foods contain the nutrients you need and are low in calories. Decrease your intake of foods high in solid fats, added sugars, and salt. Get information about a proper diet from your health care provider, if necessary.  Regular physical exercise is one of the most important things you can do for your health. Most adults should get at least 150 minutes of moderate-intensity exercise (any activity that increases your heart rate and causes you to sweat) each week. In addition, most adults need muscle-strengthening exercises on 2 or more days a week.   Maintain a healthy weight. The body mass index (BMI) is a screening tool to identify possible weight problems. It provides an estimate of body fat based on height and weight. Your health care provider can find your BMI and can help you achieve or maintain a healthy weight. For males 20 years and older:  A BMI below 18.5 is considered underweight.  A BMI of 18.5 to 24.9 is normal.  A BMI of 25 to 29.9 is considered overweight.  A BMI of 30 and above is considered obese.  Maintain normal blood lipids and cholesterol by exercising and minimizing your intake of saturated fat. Eat a balanced diet with plenty of fruits and vegetables. Blood tests for lipids  and cholesterol should begin at age 49 and be repeated every 5 years. If your lipid or cholesterol levels are high, you are over age 68, or you are at high risk for heart disease, you may need your cholesterol levels checked more frequently.Ongoing high lipid and cholesterol levels should be treated with medicines if diet and exercise are not working.  If you smoke, find out from your health care provider how to quit. If you do not use tobacco, do not start.  Lung cancer screening is recommended for adults aged 44-80 years who are at high risk for developing lung cancer because of a history of smoking. A yearly low-dose CT scan of the lungs is recommended for people who have at least a 30-pack-year history of smoking and are current smokers or have quit within the past 15 years. A pack year of smoking is smoking an average of 1 pack of cigarettes a day for 1 year (for example, a 30-pack-year history of smoking could mean smoking 1 pack a day for 30 years or 2 packs a day for 15 years). Yearly screening should continue until the smoker has stopped smoking for at least 15 years. Yearly screening should be stopped for people who develop a health problem that would prevent them from having lung cancer treatment.  If you choose to drink alcohol, do not have more than 2 drinks per day. One drink is considered to be 12 oz (360 mL) of beer, 5 oz (150 mL) of wine, or 1.5 oz (45 mL) of liquor.  Avoid  the use of street drugs. Do not share needles with anyone. Ask for help if you need support or instructions about stopping the use of drugs.  High blood pressure causes heart disease and increases the risk of stroke. High blood pressure is more likely to develop in:  People who have blood pressure in the end of the normal range (100-139/85-89 mm Hg).  People who are overweight or obese.  People who are African American.  If you are 59-17 years of age, have your blood pressure checked every 3-5 years. If you are  52 years of age or older, have your blood pressure checked every year. You should have your blood pressure measured twice--once when you are at a hospital or clinic, and once when you are not at a hospital or clinic. Record the average of the two measurements. To check your blood pressure when you are not at a hospital or clinic, you can use:  An automated blood pressure machine at a pharmacy.  A home blood pressure monitor.  If you are 8-42 years old, ask your health care provider if you should take aspirin to prevent heart disease.  Diabetes screening involves taking a blood sample to check your fasting blood sugar level. This should be done once every 3 years after age 51 if you are at a normal weight and without risk factors for diabetes. Testing should be considered at a younger age or be carried out more frequently if you are overweight and have at least 1 risk factor for diabetes.  Colorectal cancer can be detected and often prevented. Most routine colorectal cancer screening begins at the age of 62 and continues through age 98. However, your health care provider may recommend screening at an earlier age if you have risk factors for colon cancer. On a yearly basis, your health care provider may provide home test kits to check for hidden blood in the stool. A small camera at the end of a tube may be used to directly examine the colon (sigmoidoscopy or colonoscopy) to detect the earliest forms of colorectal cancer. Talk to your health care provider about this at age 61 when routine screening begins. A direct exam of the colon should be repeated every 5-10 years through age 12, unless early forms of precancerous polyps or small growths are found.  People who are at an increased risk for hepatitis B should be screened for this virus. You are considered at high risk for hepatitis B if:  You were born in a country where hepatitis B occurs often. Talk with your health care provider about which  countries are considered high risk.  Your parents were born in a high-risk country and you have not received a shot to protect against hepatitis B (hepatitis B vaccine).  You have HIV or AIDS.  You use needles to inject street drugs.  You live with, or have sex with, someone who has hepatitis B.  You are a man who has sex with other men (MSM).  You get hemodialysis treatment.  You take certain medicines for conditions like cancer, organ transplantation, and autoimmune conditions.  Hepatitis C blood testing is recommended for all people born from [birth year] through 1967 and any individual with known risk factors for hepatitis C.  Healthy men should no longer receive prostate-specific antigen (PSA) blood tests as part of routine cancer screening. Talk to your health care provider about prostate cancer screening.  Testicular cancer screening is not recommended for adolescents or adult males who have  no symptoms. Screening includes self-exam, a health care provider exam, and other screening tests. Consult with your health care provider about any symptoms you have or any concerns you have about testicular cancer.  Practice safe sex. Use condoms and avoid high-risk sexual practices to reduce the spread of sexually transmitted infections (STIs).  You should be screened for STIs, including gonorrhea and chlamydia if:  You are sexually active and are younger than 24 years.  You are older than 24 years, and your health care provider tells you that you are at risk for this type of infection.  Your sexual activity has changed since you were last screened, and you are at an increased risk for chlamydia or gonorrhea. Ask your health care provider if you are at risk.  If you are at risk of being infected with HIV, it is recommended that you take a prescription medicine daily to prevent HIV infection. This is called pre-exposure prophylaxis (PrEP). You are considered at risk if:  You are a man who has  sex with other men (MSM).  You are a heterosexual man who is sexually active with multiple partners.  You take drugs by injection.  You are sexually active with a partner who has HIV.  Talk with your health care provider about whether you are at high risk of being infected with HIV. If you choose to begin PrEP, you should first be tested for HIV. You should then be tested every 3 months for as long as you are taking PrEP.  Use sunscreen. Apply sunscreen liberally and repeatedly throughout the day. You should seek shade when your shadow is shorter than you. Protect yourself by wearing long sleeves, pants, a wide-brimmed hat, and sunglasses year round whenever you are outdoors.  Tell your health care provider of new moles or changes in moles, especially if there is a change in shape or color. Also, tell your health care provider if a mole is larger than the size of a pencil eraser.  A one-time screening for abdominal aortic aneurysm (AAA) and surgical repair of large AAAs by ultrasound is recommended for men aged 8-75 years who are current or former smokers.  Stay current with your vaccines (immunizations).   This information is not intended to replace advice given to you by your health care provider. Make sure you discuss any questions you have with your health care provider.   Document Released: 05/25/2008 Document Revised: 12/18/2014 Document Reviewed: 04/24/2011 Elsevier Interactive Patient Education [employer].

## 2015-11-17 NOTE — Assessment & Plan Note (Signed)
Chronic, stable. Continue current regimen. 

## 2016-02-08 ENCOUNTER — Encounter: Payer: Self-pay | Admitting: Gastroenterology

## 2016-02-16 DIAGNOSIS — R69 Illness, unspecified: Secondary | ICD-10-CM | POA: Diagnosis not present

## 2016-05-13 ENCOUNTER — Telehealth: Payer: Self-pay

## 2016-05-13 NOTE — Telephone Encounter (Signed)
Patient is on the list for [employer] 2017 and may be a good candidate for an AWV in 2017. Please let me know if/when appt is scheduled.   RE: Due in December

## 2016-05-16 NOTE — Telephone Encounter (Signed)
Attempted to reach pt to schedule an AWV. Left voicemail with contact info.

## 2016-05-17 ENCOUNTER — Other Ambulatory Visit: Payer: Medicare HMO

## 2016-06-13 ENCOUNTER — Other Ambulatory Visit: Payer: Self-pay | Admitting: Family Medicine

## 2016-07-10 ENCOUNTER — Other Ambulatory Visit: Payer: Self-pay | Admitting: Family Medicine

## 2016-08-30 ENCOUNTER — Other Ambulatory Visit (INDEPENDENT_AMBULATORY_CARE_PROVIDER_SITE_OTHER): Payer: Medicare HMO

## 2016-08-30 DIAGNOSIS — I10 Essential (primary) hypertension: Secondary | ICD-10-CM | POA: Diagnosis not present

## 2016-08-30 DIAGNOSIS — E785 Hyperlipidemia, unspecified: Secondary | ICD-10-CM | POA: Diagnosis not present

## 2016-08-30 DIAGNOSIS — E538 Deficiency of other specified B group vitamins: Secondary | ICD-10-CM | POA: Diagnosis not present

## 2016-08-30 DIAGNOSIS — Z1159 Encounter for screening for other viral diseases: Secondary | ICD-10-CM

## 2016-08-30 LAB — BASIC METABOLIC PANEL
BUN: 18 mg/dL (ref 6–23)
CHLORIDE: 105 meq/L (ref 96–112)
CO2: 26 meq/L (ref 19–32)
CREATININE: 1.05 mg/dL (ref 0.40–1.50)
Calcium: 8.6 mg/dL (ref 8.4–10.5)
GFR: 73.82 mL/min (ref >60.00)
Glucose, Bld: 112 mg/dL — ABNORMAL HIGH (ref 70–99)
POTASSIUM: 4.3 meq/L (ref 3.5–5.1)
Sodium: 139 mEq/L (ref 135–145)

## 2016-08-30 LAB — VITAMIN B12: VITAMIN B 12: 720 pg/mL (ref 211–911)

## 2016-08-30 LAB — LIPID PANEL
CHOL/HDL RATIO: 5
CHOLESTEROL: 204 mg/dL — AB (ref 0–200)
HDL: 39.5 mg/dL (ref >39.00)
NonHDL: 164.08
Triglycerides: 279 mg/dL — ABNORMAL HIGH (ref 0.0–149.0)
VLDL: 55.8 mg/dL — AB (ref 0.0–40.0)

## 2016-08-30 LAB — MICROALBUMIN / CREATININE URINE RATIO
Creatinine,U: 165.2 mg/dL
MICROALB/CREAT RATIO: 0.9 mg/g (ref 0.0–30.0)
Microalb, Ur: 1.5 mg/dL (ref 0.0–1.9)

## 2016-08-30 LAB — LDL CHOLESTEROL, DIRECT: Direct LDL: 131 mg/dL

## 2016-08-30 NOTE — Addendum Note (Signed)
Addended by: Ellamae Sia on: 08/30/2016 09:28 AM   Modules accepted: Orders

## 2016-08-31 ENCOUNTER — Ambulatory Visit (INDEPENDENT_AMBULATORY_CARE_PROVIDER_SITE_OTHER): Payer: Medicare HMO | Admitting: Family Medicine

## 2016-08-31 ENCOUNTER — Encounter: Payer: Self-pay | Admitting: Family Medicine

## 2016-08-31 VITALS — BP 142/70 | HR 74 | Temp 98.2°F | Ht 70.5 in | Wt 227.8 lb

## 2016-08-31 DIAGNOSIS — Z23 Encounter for immunization: Secondary | ICD-10-CM

## 2016-08-31 DIAGNOSIS — E538 Deficiency of other specified B group vitamins: Secondary | ICD-10-CM

## 2016-08-31 DIAGNOSIS — E1140 Type 2 diabetes mellitus with diabetic neuropathy, unspecified: Secondary | ICD-10-CM | POA: Insufficient documentation

## 2016-08-31 DIAGNOSIS — M10071 Idiopathic gout, right ankle and foot: Secondary | ICD-10-CM | POA: Diagnosis not present

## 2016-08-31 DIAGNOSIS — I10 Essential (primary) hypertension: Secondary | ICD-10-CM

## 2016-08-31 DIAGNOSIS — E669 Obesity, unspecified: Secondary | ICD-10-CM

## 2016-08-31 DIAGNOSIS — G6289 Other specified polyneuropathies: Secondary | ICD-10-CM

## 2016-08-31 DIAGNOSIS — E785 Hyperlipidemia, unspecified: Secondary | ICD-10-CM

## 2016-08-31 DIAGNOSIS — R7303 Prediabetes: Secondary | ICD-10-CM | POA: Diagnosis not present

## 2016-08-31 DIAGNOSIS — R6889 Other general symptoms and signs: Secondary | ICD-10-CM

## 2016-08-31 DIAGNOSIS — G629 Polyneuropathy, unspecified: Secondary | ICD-10-CM

## 2016-08-31 HISTORY — DX: Type 2 diabetes mellitus with diabetic neuropathy, unspecified: E11.40

## 2016-08-31 LAB — HEPATITIS C ANTIBODY: HCV AB: NEGATIVE

## 2016-08-31 MED ORDER — INDOMETHACIN 50 MG PO CAPS: 50.0000 mg | ORAL_CAPSULE | Freq: Three times a day (TID) | ORAL | 3 refills | Status: DC | PRN

## 2016-08-31 MED ORDER — VITAMIN B-12 500 MCG PO TABS: 500.0000 ug | ORAL_TABLET | Freq: Every day | ORAL | Status: DC

## 2016-08-31 MED ORDER — ATORVASTATIN CALCIUM 20 MG PO TABS: 20.0000 mg | ORAL_TABLET | Freq: Every day | ORAL | 6 refills | Status: DC

## 2016-08-31 NOTE — Patient Instructions (Addendum)
Flu shot today. Decrease B12 to 563mcg daily. I recommend starting cholesterol medicine. Start atorvastatin (lipitor) 20mg  daily - sent to [facility] today.  Return in 6 months for physical. Good to see you today, call us with questions.

## 2016-08-31 NOTE — Assessment & Plan Note (Signed)
Ongoing, not improved with b12 replacement.  Denies significant alcohol use. A1c was improved as of last visit - will update today.  Now also endorsing heat intolerance with night sweats - check SPEP, CBC, TSH.

## 2016-08-31 NOTE — Assessment & Plan Note (Signed)
Check TSH, SPEP.

## 2016-08-31 NOTE — Progress Notes (Signed)
BP (!) 142/70 (BP Location: Left Arm, Patient Position: Sitting)   Pulse 74   Temp 98.2 F (36.8 C) (Oral)   Ht 5' 10.5" (1.791 m)   Wt 227 lb 12 oz (103.3 kg)   SpO2 94%   BMI 32.22 kg/m    CC: f/u visit Subjective:    Patient ID: Devin Huffman, male    DOB: 07/15/1946, 70 y.o.   MRN: [id number]  HPI: Devin Huffman is a 70 y.o. male presenting on 08/31/2016 for Follow up lab results and Right foot pain (Concern for gout flare and need for indomethacin refill.)   Last visit we started vit b12 1000 mcg orally daily for markedly low levels.   Gout - treated with PRN indocin without frequent flares. Worried about recurrent flare of R lateral foot due to tenderness to palpation x 2 days. No redness or warmth or swelling.   HTN - Compliant with current antihypertensive regimen of toprol XL 25mg d aily, amlodipine 5mg  daily. Does not check blood pressures at home. No low blood pressure readings or symptoms of dizziness/syncope.  Denies HA, vision changes, CP/tightness, SOB, leg swelling.    Prediabetes - improved with diet changes last visit. Did not recheck this lab work.  HLD - declined statin last visit. I recommended at least start 2000mg  fish oiil daily. Levels remain elevated.   Several year h/o altered sensation of "walking on bubbles" of bilateral soles with mild burning discomfort, no numbness. Rare EtOH use. B12 level has been repleted. Endorses longstanding history of heat intolerance with diaphoresis.    Relevant past medical, surgical, family and social history reviewed and updated as indicated. Interim medical history since our last visit reviewed. Allergies and medications reviewed and updated. Current Outpatient Prescriptions on File Prior to Visit  Medication Sig  . amLODipine (NORVASC) 5 MG tablet TAKE ONE TABLET BY MOUTH ONCE DAILY  . aspirin 81 MG tablet Take 81 mg by mouth daily.  . metoprolol succinate (TOPROL-XL) 25 MG 24 hr tablet TAKE ONE TABLET BY MOUTH  ONCE DAILY  . Omega-3 Fatty Acids (FISH OIL) 1000 MG CAPS Take 2,000 mg by mouth daily.  Marland Kitchen omeprazole (PRILOSEC) 20 MG capsule TAKE ONE CAPSULE BY MOUTH ONCE DAILY AS NEEDED  . [DISCONTINUED] lisinopril (PRINIVIL,ZESTRIL) 5 MG tablet Take 1 tablet (5 mg total) by mouth daily.   No current facility-administered medications on file prior to visit.     Review of Systems Per HPI unless specifically indicated in ROS section     Objective:    BP (!) 142/70 (BP Location: Left Arm, Patient Position: Sitting)   Pulse 74   Temp 98.2 F (36.8 C) (Oral)   Ht 5' 10.5" (1.791 m)   Wt 227 lb 12 oz (103.3 kg)   SpO2 94%   BMI 32.22 kg/m   Wt Readings from Last 3 Encounters:  08/31/16 227 lb 12 oz (103.3 kg)  11/17/15 220 lb 4 oz (99.9 kg)  05/14/15 230 lb 4 oz (104.4 kg)    Physical Exam  Constitutional: He appears well-developed and well-nourished. No distress.  HENT:  Mouth/Throat: Oropharynx is clear and moist. No oropharyngeal exudate.  Cardiovascular: Normal rate, regular rhythm, normal heart sounds and intact distal pulses.   No murmur heard. Pulmonary/Chest: Effort normal and breath sounds normal. No respiratory distress. He has no wheezes. He has no rales.  Musculoskeletal: He exhibits no edema.  Mild swelling R lateral foot along base of 5th MP with point tenderness and warmth.  No pain at 1st MTPJ 2+ DP bilaterally  Skin: Skin is warm and dry. No rash noted. There is erythema.  Psychiatric: He has a normal mood and affect.  Nursing note and vitals reviewed.  Results for orders placed or performed in visit on 08/30/16  Lipid panel  Result Value Ref Range   Cholesterol 204 (H) 0 - 200 mg/dL   Triglycerides 279.0 (H) 0.0 - 149.0 mg/dL   HDL 39.50 >39.00 mg/dL   VLDL 55.8 (H) 0.0 - 40.0 mg/dL   Total CHOL/HDL Ratio 5    NonHDL 164.08   Vitamin B12  Result Value Ref Range   Vitamin B-12 720 211 - 911 pg/mL  Microalbumin / creatinine urine ratio  Result Value Ref Range    Microalb, Ur 1.5 0.0 - 1.9 mg/dL   Creatinine,U 165.2 mg/dL   Microalb Creat Ratio 0.9 0.0 - 30.0 mg/g  Basic metabolic panel  Result Value Ref Range   Sodium 139 135 - 145 mEq/L   Potassium 4.3 3.5 - 5.1 mEq/L   Chloride 105 96 - 112 mEq/L   CO2 26 19 - 32 mEq/L   Glucose, Bld 112 (H) 70 - 99 mg/dL   BUN 18 6 - 23 mg/dL   Creatinine, Ser 1.05 0.40 - 1.50 mg/dL   Calcium 8.6 8.4 - 10.5 mg/dL   GFR 73.82 >60.00 mL/min  Hepatitis C antibody  Result Value Ref Range   HCV Ab NEGATIVE NEGATIVE  LDL cholesterol, direct  Result Value Ref Range   Direct LDL 131.0 mg/dL   Lab Results  Component Value Date   HGBA1C 6.0 11/12/2015       Assessment & Plan:   Problem List Items Addressed This Visit    Dyslipidemia    Longstanding dyslipidemia.  Not regular with fish oil. ASCVD 10 yr risk = 29%.  Discussed - pt agrees to restart fish oil 2000gm daily and add lipitor 20mg  daily. Discussed monitoring for worsening myalgias. Recheck in 6 months.        Relevant Medications   atorvastatin (LIPITOR) 20 MG tablet   Essential hypertension    Chronic, mildly elevated today. No changes to treatment regimen.       Relevant Medications   atorvastatin (LIPITOR) 20 MG tablet   Gout - Primary    Anticipate gout flare - treat with indocin. Check urate levels.       Relevant Orders   Uric acid   Heat intolerance    Check TSH, SPEP.       Relevant Orders   Serum protein electrophoresis with reflex   TSH   Obesity, Class I, BMI 30-34.9    Weight gain noted.       Peripheral neuropathy (HCC)    Ongoing, not improved with b12 replacement.  Denies significant alcohol use. A1c was improved as of last visit - will update today.  Now also endorsing heat intolerance with night sweats - check SPEP, CBC, TSH.       Relevant Orders   CBC with Differential/Platelet   Serum protein electrophoresis with reflex   TSH   Prediabetes    Update A1c.       Relevant Orders   Hemoglobin A1c     Vitamin B12 deficiency    Levels now repleted with 1044mcg daily. Will decrease dose to 593mcg daily.        Other Visit Diagnoses    Need for influenza vaccination       Relevant Orders   Flu  Vaccine QUAD 36+ mos PF IM (Fluarix & Fluzone Quad PF) (Completed)       Follow up plan: Return in about 6 months (around 02/28/2017) for follow up visit.  Devin Bush, MD

## 2016-08-31 NOTE — Assessment & Plan Note (Signed)
Anticipate gout flare - treat with indocin. Check urate levels.

## 2016-08-31 NOTE — Progress Notes (Signed)
Pre visit review using our clinic review tool, if applicable. No additional management support is needed unless otherwise documented below in the visit note. 

## 2016-08-31 NOTE — Assessment & Plan Note (Signed)
Levels now repleted with 1060mcg daily. Will decrease dose to 584mcg daily.

## 2016-08-31 NOTE — Assessment & Plan Note (Signed)
Chronic, mildly elevated today. No changes to treatment regimen.

## 2016-08-31 NOTE — Assessment & Plan Note (Signed)
Longstanding dyslipidemia.  Not regular with fish oil. ASCVD 10 yr risk = 29%.  Discussed - pt agrees to restart fish oil 2000gm daily and add lipitor 20mg  daily. Discussed monitoring for worsening myalgias. Recheck in 6 months.

## 2016-08-31 NOTE — Assessment & Plan Note (Signed)
Update A1c ?

## 2016-08-31 NOTE — Assessment & Plan Note (Signed)
Weight gain noted.

## 2016-09-01 LAB — CBC WITH DIFFERENTIAL/PLATELET
BASOS ABS: 0.1 10*3/uL (ref 0.0–0.1)
Basophils Relative: 0.7 % (ref 0.0–3.0)
EOS PCT: 1.9 % (ref 0.0–5.0)
Eosinophils Absolute: 0.2 10*3/uL (ref 0.0–0.7)
HEMATOCRIT: 43.2 % (ref 39.0–52.0)
Hemoglobin: 14.7 g/dL (ref 13.0–17.0)
LYMPHS ABS: 2.6 10*3/uL (ref 0.7–4.0)
LYMPHS PCT: 30.5 % (ref 12.0–46.0)
MCHC: 34.2 g/dL (ref 30.0–36.0)
MCV: 96.2 fl (ref 78.0–100.0)
MONOS PCT: 8.8 % (ref 3.0–12.0)
Monocytes Absolute: 0.8 10*3/uL (ref 0.1–1.0)
NEUTROS ABS: 5 10*3/uL (ref 1.4–7.7)
NEUTROS PCT: 58.1 % (ref 43.0–77.0)
PLATELETS: 273 10*3/uL (ref 150.0–400.0)
RBC: 4.49 Mil/uL (ref 4.22–5.81)
RDW: 12.9 % (ref 11.5–15.5)
WBC: 8.6 10*3/uL (ref 4.0–10.5)

## 2016-09-01 LAB — HEMOGLOBIN A1C: Hgb A1c MFr Bld: 5.7 % (ref 4.6–6.5)

## 2016-09-01 LAB — URIC ACID: Uric Acid, Serum: 9.9 mg/dL — ABNORMAL HIGH (ref 4.0–7.8)

## 2016-09-01 LAB — TSH: TSH: 0.91 u[IU]/mL (ref 0.35–4.50)

## 2016-09-06 LAB — PROTEIN ELECTROPHORESIS, SERUM, WITH REFLEX
ABNORMAL PROTEIN BAND2: NOT DETECTED g/dL
ALPHA-1-GLOBULIN: 0.3 g/dL (ref 0.2–0.3)
Abnormal Protein Band3: NOT DETECTED g/dL
Albumin ELP: 3.9 g/dL (ref 3.8–4.8)
Alpha-2-Globulin: 0.7 g/dL (ref 0.5–0.9)
Beta 2: 0.3 g/dL (ref 0.2–0.5)
Beta Globulin: 0.5 g/dL (ref 0.4–0.6)
GAMMA GLOBULIN: 1 g/dL (ref 0.8–1.7)
TOTAL PROTEIN, SERUM ELECTROPHOR: 6.6 g/dL (ref 6.1–8.1)

## 2016-09-08 ENCOUNTER — Encounter: Payer: Self-pay | Admitting: *Deleted

## 2016-09-08 ENCOUNTER — Other Ambulatory Visit: Payer: Self-pay | Admitting: Family Medicine

## 2016-09-08 MED ORDER — VITAMIN C 500 MG PO TABS: 500.0000 mg | ORAL_TABLET | Freq: Every day | ORAL | Status: DC

## 2016-09-14 DIAGNOSIS — R69 Illness, unspecified: Secondary | ICD-10-CM | POA: Diagnosis not present

## 2016-09-21 ENCOUNTER — Telehealth: Payer: Self-pay | Admitting: Family Medicine

## 2016-09-21 NOTE — Telephone Encounter (Signed)
Hope his wife is doing ok after fall.  Ok to hold atorvastatin 20mg  for next 1 week to see if any improvement. Update [place] next week with effect, if pain resolved will likely recommend different cholesterol medicine.

## 2016-09-21 NOTE — Telephone Encounter (Signed)
Patient Name: Devin Huffman  DOB: 07-12-1946    Initial Comment Caller states husband taking new medication for cholesterol, having discomfort in legs, hard to walk    Nurse Assessment  Nurse: Raphael Gibney, RN, Vanita Ingles Date/Time Eilene Ghazi Time): 09/21/2016 2:37:22 PM  Confirm and document reason for call. If symptomatic, describe symptoms. You must click the next button to save text entered. ---Caller states he had started atoravastatin on Sept 21. Has started having leg cramps. When he walks, he has pain.  Has the patient traveled out of the country within the last 30 days? ---Not Applicable  Does the patient have any new or worsening symptoms? ---Yes  Will a triage be completed? ---Yes  Related visit to physician within the last 2 weeks? ---No  Does the PT have any chronic conditions? (i.e. diabetes, asthma, etc.) ---No  Is this a behavioral health or substance abuse call? ---No     Guidelines    Guideline Title Affirmed Question Affirmed Notes  Leg Pain [1] MODERATE pain (e.g., interferes with normal activities, limping) AND [2] present > 3 days    Final Disposition User   See PCP When Office is Open (within 3 days) Raphael Gibney, Therapist, sports, Vanita Ingles    Comments  Pt states his wife just fell and would like call back in a few min  Pt does not want to make appt at this time. He wants to stop the atoravastatin for a few days to see if the pain gets better.  Legs feel weak when he walks up stairs. Leg cramps and weakness started after he started taking the atoravastatin.   Disagree/Comply: Disagree  Disagree/Comply Reason: Disagree with instructions

## 2016-09-22 NOTE — Telephone Encounter (Signed)
Message left advising patient.  

## 2016-12-14 ENCOUNTER — Other Ambulatory Visit: Payer: Self-pay | Admitting: Family Medicine

## 2016-12-19 DIAGNOSIS — R69 Illness, unspecified: Secondary | ICD-10-CM | POA: Diagnosis not present

## 2017-01-15 ENCOUNTER — Other Ambulatory Visit: Payer: Self-pay | Admitting: Family Medicine

## 2017-01-17 NOTE — Telephone Encounter (Signed)
Pt left v/m that BP meds were not refilled for [facility] [address]. Sybil at [facility] said both rx ready for pick up. Notified pt and pt voiced understanding.

## 2017-02-01 DIAGNOSIS — M1611 Unilateral primary osteoarthritis, right hip: Secondary | ICD-10-CM | POA: Diagnosis not present

## 2017-02-08 DIAGNOSIS — M1611 Unilateral primary osteoarthritis, right hip: Secondary | ICD-10-CM | POA: Diagnosis not present

## 2017-02-13 ENCOUNTER — Ambulatory Visit: Payer: Self-pay | Admitting: Orthopedic Surgery

## 2017-02-22 ENCOUNTER — Ambulatory Visit (INDEPENDENT_AMBULATORY_CARE_PROVIDER_SITE_OTHER)
Admission: RE | Admit: 2017-02-22 | Discharge: 2017-02-22 | Disposition: A | Discharge status: Home / Self Care | Payer: Medicare HMO | Source: Ambulatory Visit | Attending: Family Medicine | Admitting: Family Medicine

## 2017-02-22 ENCOUNTER — Ambulatory Visit (INDEPENDENT_AMBULATORY_CARE_PROVIDER_SITE_OTHER): Payer: Medicare HMO | Admitting: Family Medicine

## 2017-02-22 ENCOUNTER — Encounter: Payer: Self-pay | Admitting: Family Medicine

## 2017-02-22 ENCOUNTER — Encounter (INDEPENDENT_AMBULATORY_CARE_PROVIDER_SITE_OTHER): Payer: Self-pay

## 2017-02-22 VITALS — BP 140/82 | HR 76 | Temp 98.2°F | Wt 234.8 lb

## 2017-02-22 DIAGNOSIS — M1A0710 Idiopathic chronic gout, right ankle and foot, without tophus (tophi): Secondary | ICD-10-CM | POA: Diagnosis not present

## 2017-02-22 DIAGNOSIS — E785 Hyperlipidemia, unspecified: Secondary | ICD-10-CM

## 2017-02-22 DIAGNOSIS — Z01818 Encounter for other preprocedural examination: Secondary | ICD-10-CM

## 2017-02-22 DIAGNOSIS — I77810 Thoracic aortic ectasia: Secondary | ICD-10-CM | POA: Diagnosis not present

## 2017-02-22 DIAGNOSIS — I517 Cardiomegaly: Secondary | ICD-10-CM | POA: Diagnosis not present

## 2017-02-22 DIAGNOSIS — R7303 Prediabetes: Secondary | ICD-10-CM | POA: Diagnosis not present

## 2017-02-22 DIAGNOSIS — I10 Essential (primary) hypertension: Secondary | ICD-10-CM | POA: Diagnosis not present

## 2017-02-22 DIAGNOSIS — E669 Obesity, unspecified: Secondary | ICD-10-CM | POA: Diagnosis not present

## 2017-02-22 MED ORDER — AMLODIPINE BESYLATE 5 MG PO TABS: 5.0000 mg | ORAL_TABLET | Freq: Every day | ORAL | 1 refills | Status: DC

## 2017-02-22 MED ORDER — METOPROLOL SUCCINATE ER 25 MG PO TB24: 25.0000 mg | ORAL_TABLET | Freq: Every day | ORAL | 1 refills | Status: DC

## 2017-02-22 NOTE — Assessment & Plan Note (Signed)
Noted on xray today. Will order echocardiogram to further evaluate this. This should not limit orthopedic surgery however.

## 2017-02-22 NOTE — Progress Notes (Signed)
Pre visit review using our clinic review tool, if applicable. No additional management support is needed unless otherwise documented below in the visit note. 

## 2017-02-22 NOTE — Progress Notes (Signed)
BP 140/82   Pulse 76   Temp 98.2 F (36.8 C) (Oral)   Wt 234 lb 12 oz (106.5 kg)   BMI 33.21 kg/m    CC: preop evaluation Subjective:    Patient ID: Devin Huffman, male    DOB: 07/24/1946, 71 y.o.   MRN: [id number]  HPI: Devin Huffman is a 71 y.o. male presenting on 02/22/2017 for Pre-op Exam   Upcoming R total hip replacement with Dr Maureen Ralphs scheduled 03/07/2017. Bone on bone. Regularly takes ibuporfen 800mg  bid. Here for preop evaluation. 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   Known HLD, prediabetes, gout and HTN. Does not see cardiology.  Takes PRN indocin for gout. Gets a few flares per year. Not interested in daily ppx medication.   He has previously undergone GETA and tolerated well, latest surgery was lumbar fusion 2014.   Denies chest pain, tightness, dyspnea. Activity limited by hip - out of work for the past month due to to this. Able to get up flight of stairs without dyspnea. Normally works part time at [employer].  Has preop at hospital next week with labs.   HTN - Compliant with current antihypertensive regimen of amlodipine 5mg  daily and toprol xl 25mg  daily. Does not check blood pressures at home. No low blood pressure readings or symptoms of dizziness/syncope. Denies HA, vision changes, CP/tightness, SOB, leg swelling.   Relevant past medical, surgical, family and social history reviewed and updated as indicated. Interim medical history since our last visit reviewed. Allergies and medications reviewed and updated. Outpatient Medications Prior to Visit  Medication Sig Dispense Refill  . aspirin 81 MG tablet Take 81 mg by mouth daily.    Marland Kitchen atorvastatin (LIPITOR) 20 MG tablet Take 1 tablet (20 mg total) by mouth daily. 30 tablet 6  . indomethacin (INDOCIN) 50 MG capsule Take 1 capsule (50 mg total) by mouth 3 (three) times daily as needed. 30 capsule 3  . Omega-3 Fatty Acids (FISH OIL) 1000 MG CAPS Take 2,000 mg by mouth  daily.    Marland Kitchen omeprazole (PRILOSEC) 20 MG capsule TAKE ONE CAPSULE BY MOUTH ONCE DAILY AS NEEDED 90 capsule 1  . vitamin B-12 (CYANOCOBALAMIN) 500 MCG tablet Take 1 tablet (500 mcg total) by mouth daily.    . vitamin C (ASCORBIC ACID) 500 MG tablet Take 1 tablet (500 mg total) by mouth daily.    Marland Kitchen amLODipine (NORVASC) 5 MG tablet TAKE ONE TABLET BY MOUTH ONCE DAILY PATIENT  NEEDS  TO  CALL  AND  SCHEDULE  APPOINTMENT 90 tablet 0  . metoprolol succinate (TOPROL-XL) 25 MG 24 hr tablet TAKE ONE TABLET BY MOUTH ONCE DAILY PATIENT  NEEDS  TO  CALL  DR  AND  SCHEDULE  APPOINTMENT 90 tablet 0   No facility-administered medications prior to visit.     Past Medical History:  Diagnosis Date  . Cerumen impaction 2016   bilateral with otitis externa s/p ENT removal - [facility]  . GERD (gastroesophageal reflux disease)   . Gout    ?due to L great toe pain, saw Dr Gladstone Lighter  . History of chicken pox   . Hyperlipidemia   . Hypertension   . Legg-Perthes disease    Right hip  . Nasal septal deviation 2016   monitoring - [facility]  . Prediabetes 03/22/2009  . Shortness of breath    with exertion    Past Surgical History:  Procedure Laterality Date  . COLONOSCOPY  03/2011  2 tubular adenomas, rec rpt 5 yrs  . HERNIA REPAIR     double hernia  . 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 TONSILLECTOMY    . VASECTOMY    . WISDOM TOOTH EXTRACTION      Social History  Substance Use Topics  . Smoking status: Never Smoker  . Smokeless tobacco: Never Used  . Alcohol use No    Family History  Problem Relation Age of Onset  . Breast cancer Mother     with recurrence  . Alzheimer's disease Father   . Healthy Sister   . Cancer Maternal Aunt     ?  Marland Kitchen Hypertension Maternal Uncle   . Stroke Maternal Uncle   . Coronary artery disease Paternal Uncle     MI  . Cancer Other      niece-2 types  . Stroke Maternal Grandfather     Per HPI unless specifically indicated in ROS section below Review of Systems     Objective:    BP 140/82   Pulse 76   Temp 98.2 F (36.8 C) (Oral)   Wt 234 lb 12 oz (106.5 kg)   BMI 33.21 kg/m   Wt Readings from Last 3 Encounters:  02/22/17 234 lb 12 oz (106.5 kg)  08/31/16 227 lb 12 oz (103.3 kg)  11/17/15 220 lb 4 oz (99.9 kg)    Physical Exam  Constitutional: He appears well-developed and well-nourished. No distress.  HENT:  Head: Normocephalic and atraumatic.  Mouth/Throat: Oropharynx is clear and moist. No oropharyngeal exudate.  Eyes: Conjunctivae and EOM are normal. Pupils are equal, round, and reactive to light.  Cardiovascular: Normal rate, regular rhythm, normal heart sounds and intact distal pulses.   No murmur heard. Pulmonary/Chest: Effort normal and breath sounds normal. No respiratory distress. He has no wheezes. He has no rales.  Musculoskeletal: He exhibits no edema.  Skin: Skin is warm and dry. No rash noted.  Psychiatric: He has a normal mood and affect.  Nursing note and vitals reviewed.  Results for orders placed or performed in visit on 08/31/16  Hemoglobin A1c  Result Value Ref Range   Hgb A1c MFr Bld 5.7 4.6 - 6.5 %  CBC with Differential/Platelet  Result Value Ref Range   WBC 8.6 4.0 - 10.5 K/uL   RBC 4.49 4.22 - 5.81 Mil/uL   Hemoglobin 14.7 13.0 - 17.0 g/dL   HCT 43.2 39.0 - 52.0 %   MCV 96.2 78.0 - 100.0 fl   MCHC 34.2 30.0 - 36.0 g/dL   RDW 12.9 11.5 - 15.5 %   Platelets 273.0 150.0 - 400.0 K/uL   Neutrophils Relative % 58.1 43.0 - 77.0 %   Lymphocytes Relative 30.5 12.0 - 46.0 %   Monocytes Relative 8.8 3.0 - 12.0 %   Eosinophils Relative 1.9 0.0 - 5.0 %   Basophils Relative 0.7 0.0 - 3.0 %   Neutro Abs 5.0 1.4 - 7.7 K/uL   Lymphs Abs 2.6 0.7 - 4.0 K/uL   Monocytes Absolute 0.8 0.1 - 1.0 K/uL   Eosinophils Absolute 0.2 0.0 - 0.7 K/uL   Basophils Absolute 0.1 0.0 - 0.1 K/uL  Uric  acid  Result Value Ref Range   Uric Acid, Serum 9.9 (H) 4.0 - 7.8 mg/dL  Serum protein electrophoresis with reflex  Result Value Ref Range   Total Protein, Serum Electrophoresis 6.6 6.1 -  8.1 g/dL   Albumin ELP 3.9 3.8 - 4.8 g/dL   Alpha-1-Globulin 0.3 0.2 - 0.3 g/dL   Alpha-2-Globulin 0.7 0.5 - 0.9 g/dL   Beta Globulin 0.5 0.4 - 0.6 g/dL   Beta 2 0.3 0.2 - 0.5 g/dL   Gamma Globulin 1.0 0.8 - 1.7 g/dL   Abnormal Protein Band1 NOT DET g/dL   SPE Interp. SEE NOTE    Abnormal Protein Band2 ND g/dL   Abnormal Protein Band3 ND g/dL  TSH  Result Value Ref Range   TSH 0.91 0.35 - 4.50 uIU/mL      Assessment & Plan:   Problem List Items Addressed This Visit    Dyslipidemia   Essential hypertension    Chronic, stable. Continue current regimen. Refilled today.       Relevant Medications   amLODipine (NORVASC) 5 MG tablet   metoprolol succinate (TOPROL-XL) 25 MG 24 hr tablet   Gout   Obesity, Class I, BMI 30-34.9   Pre-op exam - Primary    Revised cardiac risk index score = 0 Adequately low risk to proceed with surgery. No need for further preop evaluation.  Labs will be obtained at hospital preop visit next week.  CXR today.  EKG today - NSR rate 70, LAD with LAFB, normal intervals, no acute ST/T changes. unchanged from prior EKG 2014. poor R wave progression thought lead placement.       Relevant Orders   EKG 12-Lead (Completed)   DG Chest 2 View (Completed)   Prediabetes   Thoracic aortic ectasia (HCC)    Noted on xray today. Will order echocardiogram to further evaluate this. This should not limit orthopedic surgery however.       Relevant Medications   amLODipine (NORVASC) 5 MG tablet   metoprolol succinate (TOPROL-XL) 25 MG 24 hr tablet   Other Relevant Orders   ECHOCARDIOGRAM COMPLETE       Follow up plan: Return in about 6 months (around 08/25/2017) for annual exam, prior fasting for blood work.  Ria Bush, MD

## 2017-02-22 NOTE — Assessment & Plan Note (Signed)
Chronic, stable. Continue current regimen. Refilled today. 

## 2017-02-22 NOTE — Patient Instructions (Addendum)
EKG today. Chest xray today I have refilled your blood pressure medicines. We will send today's note to Dr Maureen Ralphs.  Good to see you today, call us with questions. Good luck with upcoming surgery.

## 2017-02-22 NOTE — Assessment & Plan Note (Addendum)
Revised cardiac risk index score = 0 Adequately low risk to proceed with surgery. No need for further preop evaluation.  Labs will be obtained at hospital preop visit next week.  CXR today.  EKG today - NSR rate 70, LAD with LAFB, normal intervals, no acute ST/T changes. unchanged from prior EKG 2014. poor R wave progression thought lead placement.

## 2017-02-27 NOTE — Progress Notes (Signed)
Clearance pcp  02/22/17 EKG 2018 , CXR 2018 ALL ON chart

## 2017-02-27 NOTE — Patient Instructions (Signed)
Devin Huffman  02/27/2017   Your procedure is scheduled on: 03/07/17  Report to [facility] Main  Entrance to admitting    At 0800AM.  Call this number if you have problems the morning of surgery [phone]   Remember: ONLY 1 PERSON MAY GO WITH YOU TO SHORT STAY TO GET  READY MORNING OF [place].  Do not eat food or drink liquids :After Midnight.     Take these medicines the morning of surgery with A SIP OF WATER: none DO NOT TAKE ANY DIABETIC MEDICATIONS DAY OF YOUR SURGERY                               You may not have any metal on your body including hair pins and              piercings  Do not wear jewelry, make-up, lotions, powders or perfumes, deodorant                          Men may shave face and neck.   Do not bring valuables to the hospital. [place].  Contacts, dentures or bridgework may not be worn into surgery.  Leave suitcase in the car. After surgery it may be brought to your room.                 Please read over the following fact sheets you were given: ____________________________________________________________________            [facility] - Preparing for Surgery Before surgery, you can play an important role.  Because skin is not sterile, your skin needs to be as free of germs as possible.  You can reduce the number of germs on your skin by washing with CHG (chlorahexidine gluconate) soap before surgery.  CHG is an antiseptic cleaner which kills germs and bonds with the skin to continue killing germs even after washing. Please DO NOT use if you have an allergy to CHG or antibacterial soaps.  If your skin becomes reddened/irritated stop using the CHG and inform your nurse when you arrive at Short Stay. Do not shave (including legs and underarms) for at least 48 hours prior to the first CHG shower.  You may shave your face/neck. Please follow these instructions carefully:  1.   Shower with CHG Soap the night before surgery and the  morning of Surgery.  2.  If you choose to wash your hair, wash your hair first as usual with your  normal  shampoo.  3.  After you shampoo, rinse your hair and body thoroughly to remove the  shampoo.                           4.  Use CHG as you would any other liquid soap.  You can apply chg directly  to the skin and wash                       Gently with a scrungie or clean washcloth.  5.  Apply the CHG Soap to your body ONLY FROM THE NECK DOWN.   Do not use on face/ open  Wound or open sores. Avoid contact with eyes, ears mouth and genitals (private parts).                       Wash face,  Genitals (private parts) with your normal soap.             6.  Wash thoroughly, paying special attention to the area where your surgery  will be performed.  7.  Thoroughly rinse your body with warm water from the neck down.  8.  DO NOT shower/wash with your normal soap after using and rinsing off  the CHG Soap.                9.  Pat yourself dry with a clean towel.            10.  Wear clean pajamas.            11.  Place clean sheets on your bed the night of your first shower and do not  sleep with pets. Day of Surgery : Do not apply any lotions/deodorants the morning of surgery.  Please wear clean clothes to the hospital/surgery center.  FAILURE TO FOLLOW THESE INSTRUCTIONS MAY RESULT IN THE CANCELLATION OF YOUR SURGERY PATIENT SIGNATURE_________________________________  NURSE SIGNATURE__________________________________  ________________________________________________________________________  WHAT IS A BLOOD TRANSFUSION? Blood Transfusion Information  A transfusion is the replacement of blood or some of its parts. Blood is made up of multiple cells which provide different functions.  Red blood cells carry oxygen and are used for blood loss replacement.  White blood cells fight against infection.  Platelets control  bleeding.  Plasma helps clot blood.  Other blood products are available for specialized needs, such as hemophilia or other clotting disorders. BEFORE THE TRANSFUSION  Who gives blood for transfusions?   Healthy volunteers who are fully evaluated to make sure their blood is safe. This is blood bank blood. Transfusion therapy is the safest it has ever been in the practice of medicine. Before blood is taken from a donor, a complete history is taken to make sure that person has no history of diseases nor engages in risky social behavior (examples are intravenous drug use or sexual activity with multiple partners). The donor's travel history is screened to minimize risk of transmitting infections, such as malaria. The donated blood is tested for signs of infectious diseases, such as HIV and hepatitis. The blood is then tested to be sure it is compatible with you in order to minimize the chance of a transfusion reaction. If you or a relative donates blood, this is often done in anticipation of surgery and is not appropriate for emergency situations. It takes many days to process the donated blood. RISKS AND COMPLICATIONS Although transfusion therapy is very safe and saves many lives, the main dangers of transfusion include:   Getting an infectious disease.  Developing a transfusion reaction. This is an allergic reaction to something in the blood you were given. Every precaution is taken to prevent this. The decision to have a blood transfusion has been considered carefully by your caregiver before blood is given. Blood is not given unless the benefits outweigh the risks. AFTER THE TRANSFUSION  Right after receiving a blood transfusion, you will usually feel much better and more energetic. This is especially true if your red blood cells have gotten low (anemic). The transfusion raises the level of the red blood cells which carry oxygen, and this usually causes an energy increase.  The  nurse  administering the transfusion will monitor you carefully for complications. HOME CARE INSTRUCTIONS  No special instructions are needed after a transfusion. You may find your energy is better. Speak with your caregiver about any limitations on activity for underlying diseases you may have. SEEK MEDICAL CARE IF:   Your condition is not improving after your transfusion.  You develop redness or irritation at the intravenous (IV) site. SEEK IMMEDIATE MEDICAL CARE IF:  Any of the following symptoms occur over the next 12 hours:  Shaking chills.  You have a temperature by mouth above 102 F (38.9 C), not controlled by medicine.  Chest, back, or muscle pain.  People around you feel you are not acting correctly or are confused.  Shortness of breath or difficulty breathing.  Dizziness and fainting.  You get a rash or develop hives.  You have a decrease in urine output.  Your urine turns a dark color or changes to pink, red, or brown. Any of the following symptoms occur over the next 10 days:  You have a temperature by mouth above 102 F (38.9 C), not controlled by medicine.  Shortness of breath.  Weakness after normal activity.  The white part of the eye turns yellow (jaundice).  You have a decrease in the amount of urine or are urinating less often.  Your urine turns a dark color or changes to pink, red, or brown. Document Released: 11/24/2000 Document Revised: 02/19/2012 Document Reviewed: 07/13/2008 ExitCare Patient Information 2014 [place].  _______________________________________________________________________  Incentive Spirometer  An incentive spirometer is a tool that can help keep your lungs clear and active. This tool measures how well you are filling your lungs with each breath. Taking long deep breaths may help reverse or decrease the chance of developing breathing (pulmonary) problems (especially infection) following:  A long period of time when you are  unable to move or be active. BEFORE THE PROCEDURE   If the spirometer includes an indicator to show your best effort, your nurse or respiratory therapist will set it to a desired goal.  If possible, sit up straight or lean slightly forward. Try not to slouch.  Hold the incentive spirometer in an upright position. INSTRUCTIONS FOR USE  1. Sit on the edge of your bed if possible, or sit up as far as you can in bed or on a chair. 2. Hold the incentive spirometer in an upright position. 3. Breathe out normally. 4. Place the mouthpiece in your mouth and seal your lips tightly around it. 5. Breathe in slowly and as deeply as possible, raising the piston or the ball toward the top of the column. 6. Hold your breath for 3-5 seconds or for as long as possible. Allow the piston or ball to fall to the bottom of the column. 7. Remove the mouthpiece from your mouth and breathe out normally. 8. Rest for a few seconds and repeat Steps 1 through 7 at least 10 times every 1-2 hours when you are awake. Take your time and take a few normal breaths between deep breaths. 9. The spirometer may include an indicator to show your best effort. Use the indicator as a goal to work toward during each repetition. 10. After each set of 10 deep breaths, practice coughing to be sure your lungs are clear. If you have an incision (the cut made at the time of surgery), support your incision when coughing by placing a pillow or rolled up towels firmly against it. Once you are able to get  out of bed, walk around indoors and cough well. You may stop using the incentive spirometer when instructed by your caregiver.  RISKS AND COMPLICATIONS  Take your time so you do not get dizzy or light-headed.  If you are in pain, you may need to take or ask for pain medication before doing incentive spirometry. It is harder to take a deep breath if you are having pain. AFTER USE  Rest and breathe slowly and easily.  It can be helpful to  keep track of a log of your progress. Your caregiver can provide you with a simple table to help with this. If you are using the spirometer at home, follow these instructions: [place] IF:   You are having difficultly using the spirometer.  You have trouble using the spirometer as often as instructed.  Your pain medication is not giving enough relief while using the spirometer.  You develop fever of 100.5 F (38.1 C) or higher. SEEK IMMEDIATE MEDICAL CARE IF:   You cough up bloody sputum that had not been present before.  You develop fever of 102 F (38.9 C) or greater.  You develop worsening pain at or near the incision site. MAKE SURE YOU:   Understand these instructions.  Will watch your condition.  Will get help right away if you are not doing well or get worse. Document Released: 04/09/2007 Document Revised: 02/19/2012 Document Reviewed: 06/10/2007 [facility] Patient Information 2014 [place], [place].   ________________________________________________________________________

## 2017-02-28 ENCOUNTER — Encounter (HOSPITAL_COMMUNITY)
Admission: RE | Admit: 2017-02-28 | Discharge: 2017-02-28 | Disposition: A | Discharge status: Home / Self Care | Payer: Medicare HMO | Source: Ambulatory Visit | Attending: Orthopedic Surgery | Admitting: Orthopedic Surgery

## 2017-02-28 ENCOUNTER — Encounter (HOSPITAL_COMMUNITY): Payer: Self-pay

## 2017-02-28 DIAGNOSIS — I77810 Thoracic aortic ectasia: Secondary | ICD-10-CM | POA: Insufficient documentation

## 2017-02-28 DIAGNOSIS — I5189 Other ill-defined heart diseases: Secondary | ICD-10-CM | POA: Insufficient documentation

## 2017-02-28 DIAGNOSIS — I517 Cardiomegaly: Secondary | ICD-10-CM | POA: Diagnosis not present

## 2017-02-28 HISTORY — DX: Malignant (primary) neoplasm, unspecified: C80.1

## 2017-02-28 LAB — CBC
HEMATOCRIT: 46.5 % (ref 39.0–52.0)
HEMOGLOBIN: 15.9 g/dL (ref 13.0–17.0)
MCH: 32.4 pg (ref 26.0–34.0)
MCHC: 34.2 g/dL (ref 30.0–36.0)
MCV: 94.7 fL (ref 78.0–100.0)
Platelets: 268 10*3/uL (ref 150–400)
RBC: 4.91 MIL/uL (ref 4.22–5.81)
RDW: 12.4 % (ref 11.5–15.5)
WBC: 8.5 10*3/uL (ref 4.0–10.5)

## 2017-02-28 LAB — SURGICAL PCR SCREEN
MRSA, PCR: NEGATIVE
STAPHYLOCOCCUS AUREUS: NEGATIVE

## 2017-02-28 LAB — COMPREHENSIVE METABOLIC PANEL
ALBUMIN: 4 g/dL (ref 3.5–5.0)
ALK PHOS: 96 U/L (ref 38–126)
ALT: 25 U/L (ref 17–63)
AST: 19 U/L (ref 15–41)
Anion gap: 9 (ref 5–15)
BILIRUBIN TOTAL: 0.6 mg/dL (ref 0.3–1.2)
BUN: 20 mg/dL (ref 6–20)
CALCIUM: 9.6 mg/dL (ref 8.9–10.3)
CO2: 26 mmol/L (ref 22–32)
CREATININE: 0.96 mg/dL (ref 0.61–1.24)
Chloride: 104 mmol/L (ref 101–111)
GFR calc Af Amer: >60 mL/min (ref >60)
GFR calc non Af Amer: >60 mL/min (ref >60)
GLUCOSE: 144 mg/dL — AB (ref 65–99)
Potassium: 4.2 mmol/L (ref 3.5–5.1)
Sodium: 139 mmol/L (ref 135–145)
TOTAL PROTEIN: 7.3 g/dL (ref 6.5–8.1)

## 2017-02-28 LAB — ABO/RH: ABO/RH(D): O POS

## 2017-02-28 LAB — PROTIME-INR
INR: 0.95
Prothrombin Time: 12.7 seconds (ref 11.4–15.2)

## 2017-02-28 LAB — APTT: APTT: 27 s (ref 24–36)

## 2017-03-01 ENCOUNTER — Ambulatory Visit (HOSPITAL_COMMUNITY)
Admission: RE | Admit: 2017-03-01 | Discharge: 2017-03-01 | Disposition: A | Discharge status: Home / Self Care | Payer: Medicare HMO | Source: Ambulatory Visit | Attending: Family Medicine | Admitting: Family Medicine

## 2017-03-01 DIAGNOSIS — I517 Cardiomegaly: Secondary | ICD-10-CM | POA: Diagnosis not present

## 2017-03-01 DIAGNOSIS — I77810 Thoracic aortic ectasia: Secondary | ICD-10-CM | POA: Diagnosis not present

## 2017-03-01 DIAGNOSIS — I5189 Other ill-defined heart diseases: Secondary | ICD-10-CM | POA: Diagnosis not present

## 2017-03-01 LAB — HEMOGLOBIN A1C
Hgb A1c MFr Bld: 6.3 % — ABNORMAL HIGH (ref 4.8–5.6)
Mean Plasma Glucose: 134 mg/dL

## 2017-03-01 NOTE — Progress Notes (Signed)
Hgba1c routed to Dr Wynelle Link via epic Done 02/28/17

## 2017-03-01 NOTE — Progress Notes (Signed)
  Echocardiogram 2D Echocardiogram has been performed.  Donata Clay 03/01/2017, 10:19 AM

## 2017-03-06 ENCOUNTER — Ambulatory Visit: Payer: Self-pay | Admitting: Orthopedic Surgery

## 2017-03-06 NOTE — H&P (Signed)
Devin Huffman DOB: 25-Sep-1946 Married / Language: English / Race: White Male Date of Admission:  03/07/2017 CC:  Right Hip Pain History of Present Illness  The patient is a 71 year old male who comes in  for a preoperative History and Physical. The patient is scheduled for a right total hip arthroplasty (anterior) to be performed by Dr. Dione Plover. Aluisio, MD at [facility] on 03-07-2017. The patient reports right hip problems including pain symptoms that have been present for years. The symptoms began without any known injury. Patient states that his right hip has always bothered him. 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 He said that within the last few weeks it has progressed and is unable to walk without a cane. 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 He is taking Ibuprofen twice daily. He is unable to lay on his right side and the pain is worse with weightbearing. He has pain that radiates down his thigh. Patient also notes that his right leg is shorter than the left. He has a lift in his right shoe. He is at a point where he cannot do the things that he wants or needs to do. Devin Huffman has had severe pain in the right hip for several months now but had problems with the hip for many years, getting progressively worse up until this point where it is becoming unbearable the past four weeks. Pain is in his groin, lateral hip radiating down the anterior thigh to his knee. It is occurring with all activities. It is worse with activity but also even occurring at rest. He is able to sleep at night once he gets a comfortable position. His left hip does not hurt. 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 He would like to get the hip fixed at this time. They have been treated conservatively in the past for the above stated problem and despite conservative measures, they continue to have  progressive pain and severe functional limitations and dysfunction. They have failed non-operative management including home exercise, medications. It is felt that they would benefit from undergoing total joint replacement. Risks and benefits of the procedure have been discussed with the patient and they elect to proceed with surgery. There are no active contraindications to surgery such as ongoing infection or rapidly progressive neurological disease.  Problem List/Past Medical  Foot pain [phone])  Primary osteoarthritis of right hip (M16.11)  Gastroesophageal Reflux Disease  Gout  Hypercholesterolemia  High blood pressure  Measles  Mumps   Allergies No Known Drug Allergies  Family History Cancer  mother and sister mother Cerebrovascular Accident  grandfather mothers side Hypertension  mother and sister Rheumatoid Arthritis  grandmother mothers side  Social History Alcohol use  never consumed alcohol Children  2 Current work status  retired Engineer, agricultural (Currently)  no Drug/Alcohol Rehab (Previously)  no Exercise  Exercises rarely; does other Exercises never; does other Illicit drug use  no Living situation  live with spouse Marital status  married Number of flights of stairs before winded  2-3 Pain Contract  no Tobacco / smoke exposure  no Tobacco use  never smoker  Medication History  Aspirin (81MG  Tablet, Oral) Active. AmLODIPine Besylate (5MG  Tablet, Oral) Active. Metoprolol Succinate ER (25MG  Tablet ER 24HR, Oral) Active. Ibuprofen (Oral) Specific strength unknown - Active. Indomethacin (50MG  Capsule, Oral)  Active.  Past Surgical History Inguinal Hernia Repair  laparoscopic: bilateral Spinal Surgery  Tonsillectomy  Vasectomy   Review of Systems  General Not Present- Chills, Fatigue, Fever, Memory Loss, Night Sweats, Weight Gain and Weight Loss. Skin Not Present- Eczema, Hives, Itching, Lesions and Rash. HEENT Not  Present- Dentures, Double Vision, Headache, Hearing Loss, Tinnitus and Visual Loss. Respiratory Not Present- Allergies, Chronic Cough, Coughing up blood, Shortness of breath at rest and Shortness of breath with exertion. Cardiovascular Not Present- Chest Pain, Difficulty Breathing Lying Down, Murmur, Palpitations, Racing/skipping heartbeats and Swelling. Gastrointestinal Not Present- Abdominal Pain, Bloody Stool, Constipation, Diarrhea, Difficulty Swallowing, Heartburn, Jaundice, Loss of appetitie, Nausea and Vomiting. Male Genitourinary Not Present- Blood in Urine, Discharge, Flank Pain, Incontinence, Painful Urination, Urgency, Urinary frequency, Urinary Retention, Urinating at Night and Weak urinary stream. Musculoskeletal Present- Joint Pain. Not Present- Back Pain, Joint Swelling, Morning Stiffness, Muscle Pain, Muscle Weakness and Spasms. Neurological Not Present- Blackout spells, Difficulty with balance, Dizziness, Paralysis, Tremor and Weakness. Psychiatric Not Present- Insomnia.  Vitals  Weight: 225 lb Height: 71in Weight was reported by patient. Body Surface Area: 2.22 m Body Mass Index: 31.38 kg/m  Pulse: 84 (Regular)  BP: 142/88 (Sitting, Left Arm, Standard)  Physical Exam  General Mental Status -Alert, cooperative and good historian. General Appearance-pleasant, Not in acute distress. Orientation-Oriented X3. Build & Nutrition-Well nourished and Well developed.  Head and Neck Head-normocephalic, atraumatic . Neck Global Assessment - supple, no bruit auscultated on the right, no bruit auscultated on the left.  Eye Vision-Wears corrective lenses(as needed). Pupil - Bilateral-Regular and Round. Motion - Bilateral-EOMI.  Chest and Lung Exam Auscultation Breath sounds - clear at anterior chest wall and clear at posterior chest wall. Adventitious sounds - No Adventitious sounds.  Cardiovascular Auscultation Rhythm - Regular rate and rhythm.  Heart Sounds - S1 WNL and S2 WNL. Murmurs & Other Heart Sounds - Auscultation of the heart reveals - No Murmurs.  Abdomen Palpation/Percussion Tenderness - Abdomen is non-tender to palpation. Rigidity (guarding) - Abdomen is soft. Auscultation Auscultation of the abdomen reveals - Bowel sounds normal.  Male Genitourinary Note: Not done, not pertinent to present illness   Musculoskeletal Note: On exam, well-developed male, alert and oriented, in no apparent distress. Left hip full range of motion without discomfort. Right hip flexion 120, rotation in 20, out 30, abduction 30 with discomfort on rotational activities. His knee exam is normal. Pulse, sensation, motor are intact. Significantly antalgic gait on the right.  His radiographs, AP pelvis and lateral of the right hip showed the left hip has minimal change. Right hip is nearly bone-on-bone. He has got femoral head deformity consistent with old Perthes disease. He is at least a half inch short on the right compared to the left.  Assessment & Plan Primary osteoarthritis of right hip (M16.11)  Note:Surgical Plans: Right Total Hip Replacement - Anterior Approach  Disposition: Home with Wife  PCP: Dr. Danise Mina - Pending  IV TXA  Anesthesia Issues: None  Patient was instructed on what medications to stop prior to surgery.  Signed electronically by Joelene Millin, III PA-C

## 2017-03-07 ENCOUNTER — Inpatient Hospital Stay (HOSPITAL_COMMUNITY): Payer: Medicare HMO

## 2017-03-07 ENCOUNTER — Inpatient Hospital Stay (HOSPITAL_COMMUNITY): Payer: Medicare HMO | Admitting: Anesthesiology

## 2017-03-07 ENCOUNTER — Inpatient Hospital Stay (HOSPITAL_COMMUNITY)
Admission: RE | Admit: 2017-03-07 | Discharge: 2017-03-08 | DRG: 470 | Disposition: A | Discharge status: Home Health | Payer: Medicare HMO | Source: Ambulatory Visit | Attending: Orthopedic Surgery | Admitting: Orthopedic Surgery

## 2017-03-07 ENCOUNTER — Encounter (HOSPITAL_COMMUNITY): Payer: Self-pay | Admitting: *Deleted

## 2017-03-07 ENCOUNTER — Encounter (HOSPITAL_COMMUNITY)
Admission: RE | Disposition: A | Discharge status: Home Health | Payer: Self-pay | Source: Ambulatory Visit | Attending: Orthopedic Surgery

## 2017-03-07 DIAGNOSIS — Z8249 Family history of ischemic heart disease and other diseases of the circulatory system: Secondary | ICD-10-CM

## 2017-03-07 DIAGNOSIS — Z6831 Body mass index (BMI) 31.0-31.9, adult: Secondary | ICD-10-CM

## 2017-03-07 DIAGNOSIS — K219 Gastro-esophageal reflux disease without esophagitis: Secondary | ICD-10-CM | POA: Diagnosis present

## 2017-03-07 DIAGNOSIS — Z7982 Long term (current) use of aspirin: Secondary | ICD-10-CM | POA: Diagnosis not present

## 2017-03-07 DIAGNOSIS — E669 Obesity, unspecified: Secondary | ICD-10-CM | POA: Diagnosis present

## 2017-03-07 DIAGNOSIS — Z79899 Other long term (current) drug therapy: Secondary | ICD-10-CM | POA: Diagnosis not present

## 2017-03-07 DIAGNOSIS — I10 Essential (primary) hypertension: Secondary | ICD-10-CM | POA: Diagnosis present

## 2017-03-07 DIAGNOSIS — E785 Hyperlipidemia, unspecified: Secondary | ICD-10-CM | POA: Diagnosis not present

## 2017-03-07 DIAGNOSIS — Z471 Aftercare following joint replacement surgery: Secondary | ICD-10-CM | POA: Diagnosis not present

## 2017-03-07 DIAGNOSIS — Z96649 Presence of unspecified artificial hip joint: Secondary | ICD-10-CM

## 2017-03-07 DIAGNOSIS — M109 Gout, unspecified: Secondary | ICD-10-CM | POA: Diagnosis present

## 2017-03-07 DIAGNOSIS — Z96641 Presence of right artificial hip joint: Secondary | ICD-10-CM | POA: Diagnosis not present

## 2017-03-07 DIAGNOSIS — M1611 Unilateral primary osteoarthritis, right hip: Principal | ICD-10-CM | POA: Diagnosis present

## 2017-03-07 DIAGNOSIS — Z8261 Family history of arthritis: Secondary | ICD-10-CM | POA: Diagnosis not present

## 2017-03-07 DIAGNOSIS — M169 Osteoarthritis of hip, unspecified: Secondary | ICD-10-CM

## 2017-03-07 DIAGNOSIS — R7303 Prediabetes: Secondary | ICD-10-CM | POA: Diagnosis not present

## 2017-03-07 HISTORY — PX: TOTAL HIP ARTHROPLASTY: SHX124

## 2017-03-07 HISTORY — DX: Osteoarthritis of hip, unspecified: M16.9

## 2017-03-07 LAB — TYPE AND SCREEN
ABO/RH(D): O POS
Antibody Screen: NEGATIVE

## 2017-03-07 SURGERY — ARTHROPLASTY, HIP, TOTAL, ANTERIOR APPROACH
Anesthesia: Monitor Anesthesia Care | Site: Hip | Laterality: Right

## 2017-03-07 MED ORDER — LIDOCAINE 2% (20 MG/ML) 5 ML SYRINGE
INTRAMUSCULAR | Status: DC | PRN
  Administered 2017-03-07: 60 mg via INTRAVENOUS

## 2017-03-07 MED ORDER — LIDOCAINE 2% (20 MG/ML) 5 ML SYRINGE
INTRAMUSCULAR | Status: AC
  Filled 2017-03-07: qty 5

## 2017-03-07 MED ORDER — ACETAMINOPHEN 325 MG PO TABS: 650.0000 mg | ORAL_TABLET | Freq: Four times a day (QID) | ORAL | Status: DC | PRN

## 2017-03-07 MED ORDER — METOCLOPRAMIDE HCL 5 MG PO TABS: 5.0000 mg | ORAL_TABLET | Freq: Three times a day (TID) | ORAL | Status: DC | PRN

## 2017-03-07 MED ORDER — MENTHOL 3 MG MT LOZG: 1.0000 | LOZENGE | OROMUCOSAL | Status: DC | PRN

## 2017-03-07 MED ORDER — AMLODIPINE BESYLATE 5 MG PO TABS
5.0000 mg | ORAL_TABLET | Freq: Every day | ORAL | Status: DC
  Administered 2017-03-07: 5 mg via ORAL
  Filled 2017-03-07: qty 1

## 2017-03-07 MED ORDER — BISACODYL 10 MG RE SUPP: 10.0000 mg | Freq: Every day | RECTAL | Status: DC | PRN

## 2017-03-07 MED ORDER — SODIUM CHLORIDE 0.9 % IV SOLN
INTRAVENOUS | Status: DC
  Administered 2017-03-07: 19:00:00 via INTRAVENOUS

## 2017-03-07 MED ORDER — ONDANSETRON HCL 4 MG PO TABS: 4.0000 mg | ORAL_TABLET | Freq: Four times a day (QID) | ORAL | Status: DC | PRN

## 2017-03-07 MED ORDER — ONDANSETRON HCL 4 MG/2ML IJ SOLN
INTRAMUSCULAR | Status: AC
  Filled 2017-03-07: qty 2

## 2017-03-07 MED ORDER — FLEET ENEMA 7-19 GM/118ML RE ENEM: 1.0000 | ENEMA | Freq: Once | RECTAL | Status: DC | PRN

## 2017-03-07 MED ORDER — METOCLOPRAMIDE HCL 5 MG/ML IJ SOLN: 5.0000 mg | Freq: Three times a day (TID) | INTRAMUSCULAR | Status: DC | PRN

## 2017-03-07 MED ORDER — BUPIVACAINE HCL (PF) 0.5 % IJ SOLN
INTRAMUSCULAR | Status: AC
  Filled 2017-03-07: qty 30

## 2017-03-07 MED ORDER — ACETAMINOPHEN 500 MG PO TABS
1000.0000 mg | ORAL_TABLET | Freq: Four times a day (QID) | ORAL | Status: AC
  Administered 2017-03-07 – 2017-03-08 (×4): 1000 mg via ORAL
  Filled 2017-03-07 (×4): qty 2

## 2017-03-07 MED ORDER — PHENOL 1.4 % MT LIQD: 1.0000 | OROMUCOSAL | Status: DC | PRN

## 2017-03-07 MED ORDER — DEXAMETHASONE SODIUM PHOSPHATE 10 MG/ML IJ SOLN
INTRAMUSCULAR | Status: AC
  Filled 2017-03-07: qty 1

## 2017-03-07 MED ORDER — HYDROMORPHONE HCL 1 MG/ML IJ SOLN
INTRAMUSCULAR | Status: AC
  Filled 2017-03-07: qty 1

## 2017-03-07 MED ORDER — DIPHENHYDRAMINE HCL 12.5 MG/5ML PO ELIX: 12.5000 mg | ORAL_SOLUTION | ORAL | Status: DC | PRN

## 2017-03-07 MED ORDER — BUPIVACAINE HCL (PF) 0.25 % IJ SOLN
INTRAMUSCULAR | Status: DC | PRN
  Administered 2017-03-07: 30 mL

## 2017-03-07 MED ORDER — METOPROLOL SUCCINATE ER 25 MG PO TB24
25.0000 mg | ORAL_TABLET | Freq: Every day | ORAL | Status: DC
  Administered 2017-03-07: 25 mg via ORAL
  Filled 2017-03-07: qty 1

## 2017-03-07 MED ORDER — OXYCODONE HCL 5 MG PO TABS
5.0000 mg | ORAL_TABLET | ORAL | Status: DC | PRN
  Administered 2017-03-07: 5 mg via ORAL
  Administered 2017-03-07 (×2): 10 mg via ORAL
  Administered 2017-03-07: 5 mg via ORAL
  Administered 2017-03-08 (×4): 10 mg via ORAL
  Filled 2017-03-07: qty 1
  Filled 2017-03-07 (×6): qty 2
  Filled 2017-03-07: qty 1

## 2017-03-07 MED ORDER — RIVAROXABAN 10 MG PO TABS
10.0000 mg | ORAL_TABLET | Freq: Every day | ORAL | Status: DC
  Administered 2017-03-08: 10 mg via ORAL
  Filled 2017-03-07: qty 1

## 2017-03-07 MED ORDER — ACETAMINOPHEN 10 MG/ML IV SOLN
1000.0000 mg | Freq: Once | INTRAVENOUS | Status: AC
  Administered 2017-03-07: 1000 mg via INTRAVENOUS

## 2017-03-07 MED ORDER — ACETAMINOPHEN 10 MG/ML IV SOLN
INTRAVENOUS | Status: AC
  Filled 2017-03-07: qty 100

## 2017-03-07 MED ORDER — CEFAZOLIN SODIUM-DEXTROSE 2-4 GM/100ML-% IV SOLN
INTRAVENOUS | Status: AC
  Filled 2017-03-07: qty 100

## 2017-03-07 MED ORDER — POLYETHYLENE GLYCOL 3350 17 G PO PACK: 17.0000 g | PACK | Freq: Every day | ORAL | Status: DC | PRN

## 2017-03-07 MED ORDER — METHOCARBAMOL 1000 MG/10ML IJ SOLN
500.0000 mg | Freq: Four times a day (QID) | INTRAVENOUS | Status: DC | PRN
  Administered 2017-03-07: 500 mg via INTRAVENOUS
  Filled 2017-03-07: qty 550
  Filled 2017-03-07: qty 5

## 2017-03-07 MED ORDER — TRAMADOL HCL 50 MG PO TABS: 50.0000 mg | ORAL_TABLET | Freq: Four times a day (QID) | ORAL | Status: DC | PRN

## 2017-03-07 MED ORDER — CHLORHEXIDINE GLUCONATE 4 % EX LIQD: 60.0000 mL | Freq: Once | CUTANEOUS | Status: DC

## 2017-03-07 MED ORDER — PROPOFOL 10 MG/ML IV BOLUS
INTRAVENOUS | Status: DC | PRN
  Administered 2017-03-07: 150 mg via INTRAVENOUS

## 2017-03-07 MED ORDER — METHOCARBAMOL 500 MG PO TABS
500.0000 mg | ORAL_TABLET | Freq: Four times a day (QID) | ORAL | Status: DC | PRN
  Administered 2017-03-08: 500 mg via ORAL
  Filled 2017-03-07: qty 1

## 2017-03-07 MED ORDER — FENTANYL CITRATE (PF) 250 MCG/5ML IJ SOLN
INTRAMUSCULAR | Status: AC
  Filled 2017-03-07: qty 5

## 2017-03-07 MED ORDER — OXYCODONE HCL 5 MG PO TABS: 5.0000 mg | ORAL_TABLET | Freq: Once | ORAL | Status: DC | PRN

## 2017-03-07 MED ORDER — ONDANSETRON HCL 4 MG/2ML IJ SOLN
INTRAMUSCULAR | Status: DC | PRN
  Administered 2017-03-07: 4 mg via INTRAVENOUS

## 2017-03-07 MED ORDER — MIDAZOLAM HCL 2 MG/2ML IJ SOLN
INTRAMUSCULAR | Status: AC
  Filled 2017-03-07: qty 2

## 2017-03-07 MED ORDER — MORPHINE SULFATE (PF) 4 MG/ML IV SOLN
1.0000 mg | INTRAVENOUS | Status: DC | PRN
  Administered 2017-03-07: 1 mg via INTRAVENOUS
  Filled 2017-03-07: qty 1

## 2017-03-07 MED ORDER — PANTOPRAZOLE SODIUM 40 MG PO TBEC
40.0000 mg | DELAYED_RELEASE_TABLET | Freq: Every evening | ORAL | Status: DC
  Administered 2017-03-07: 40 mg via ORAL
  Filled 2017-03-07: qty 1

## 2017-03-07 MED ORDER — TRANEXAMIC ACID 1000 MG/10ML IV SOLN
1000.0000 mg | Freq: Once | INTRAVENOUS | Status: AC
  Administered 2017-03-07: 1000 mg via INTRAVENOUS
  Filled 2017-03-07: qty 1100

## 2017-03-07 MED ORDER — MIDAZOLAM HCL 2 MG/2ML IJ SOLN
INTRAMUSCULAR | Status: DC | PRN
  Administered 2017-03-07: 2 mg via INTRAVENOUS

## 2017-03-07 MED ORDER — SODIUM CHLORIDE 0.9 % IV SOLN
1000.0000 mg | INTRAVENOUS | Status: AC
  Administered 2017-03-07: 1000 mg via INTRAVENOUS
  Filled 2017-03-07: qty 10

## 2017-03-07 MED ORDER — CEFAZOLIN SODIUM-DEXTROSE 2-4 GM/100ML-% IV SOLN
2.0000 g | INTRAVENOUS | Status: AC
  Administered 2017-03-07: 2 g via INTRAVENOUS

## 2017-03-07 MED ORDER — LACTATED RINGERS IV SOLN
INTRAVENOUS | Status: DC
  Administered 2017-03-07 (×2): via INTRAVENOUS

## 2017-03-07 MED ORDER — ACETAMINOPHEN 650 MG RE SUPP: 650.0000 mg | Freq: Four times a day (QID) | RECTAL | Status: DC | PRN

## 2017-03-07 MED ORDER — EPHEDRINE SULFATE 50 MG/ML IJ SOLN
INTRAMUSCULAR | Status: DC | PRN
  Administered 2017-03-07: 15 mg via INTRAVENOUS
  Administered 2017-03-07 (×2): 10 mg via INTRAVENOUS

## 2017-03-07 MED ORDER — OXYCODONE HCL 5 MG/5ML PO SOLN
5.0000 mg | Freq: Once | ORAL | Status: DC | PRN
  Filled 2017-03-07: qty 5

## 2017-03-07 MED ORDER — DEXAMETHASONE SODIUM PHOSPHATE 10 MG/ML IJ SOLN
10.0000 mg | Freq: Once | INTRAMUSCULAR | Status: AC
  Administered 2017-03-08: 10 mg via INTRAVENOUS
  Filled 2017-03-07: qty 1

## 2017-03-07 MED ORDER — SODIUM CHLORIDE 0.9 % IR SOLN
Status: DC | PRN
  Administered 2017-03-07: 1000 mL

## 2017-03-07 MED ORDER — DEXAMETHASONE SODIUM PHOSPHATE 10 MG/ML IJ SOLN
10.0000 mg | Freq: Once | INTRAMUSCULAR | Status: AC
  Administered 2017-03-07: 10 mg via INTRAVENOUS

## 2017-03-07 MED ORDER — LABETALOL HCL 5 MG/ML IV SOLN
INTRAVENOUS | Status: DC | PRN
  Administered 2017-03-07: 5 mg via INTRAVENOUS

## 2017-03-07 MED ORDER — PROPOFOL 10 MG/ML IV BOLUS
INTRAVENOUS | Status: AC
  Filled 2017-03-07: qty 60

## 2017-03-07 MED ORDER — EPHEDRINE 5 MG/ML INJ
INTRAVENOUS | Status: AC
  Filled 2017-03-07: qty 10

## 2017-03-07 MED ORDER — PROMETHAZINE HCL 25 MG/ML IJ SOLN: 6.2500 mg | INTRAMUSCULAR | Status: DC | PRN

## 2017-03-07 MED ORDER — DOCUSATE SODIUM 100 MG PO CAPS
100.0000 mg | ORAL_CAPSULE | Freq: Two times a day (BID) | ORAL | Status: DC
  Administered 2017-03-07 – 2017-03-08 (×2): 100 mg via ORAL
  Filled 2017-03-07 (×2): qty 1

## 2017-03-07 MED ORDER — CEFAZOLIN SODIUM-DEXTROSE 2-4 GM/100ML-% IV SOLN
2.0000 g | Freq: Four times a day (QID) | INTRAVENOUS | Status: AC
  Administered 2017-03-07 (×2): 2 g via INTRAVENOUS
  Filled 2017-03-07 (×2): qty 100

## 2017-03-07 MED ORDER — ONDANSETRON HCL 4 MG/2ML IJ SOLN
4.0000 mg | Freq: Four times a day (QID) | INTRAMUSCULAR | Status: DC | PRN
  Administered 2017-03-07 – 2017-03-08 (×2): 4 mg via INTRAVENOUS
  Filled 2017-03-07 (×2): qty 2

## 2017-03-07 MED ORDER — HYDROMORPHONE HCL 1 MG/ML IJ SOLN
0.2500 mg | INTRAMUSCULAR | Status: DC | PRN
  Administered 2017-03-07: 0.25 mg via INTRAVENOUS
  Administered 2017-03-07: 0.5 mg via INTRAVENOUS

## 2017-03-07 MED ORDER — BUPIVACAINE HCL (PF) 0.25 % IJ SOLN
INTRAMUSCULAR | Status: AC
  Filled 2017-03-07: qty 30

## 2017-03-07 MED ORDER — FENTANYL CITRATE (PF) 250 MCG/5ML IJ SOLN
INTRAMUSCULAR | Status: DC | PRN
  Administered 2017-03-07 (×4): 50 ug via INTRAVENOUS
  Administered 2017-03-07 (×2): 25 ug via INTRAVENOUS

## 2017-03-07 SURGICAL SUPPLY — 33 items
BAG DECANTER FOR FLEXI CONT (MISCELLANEOUS) ×2 IMPLANT
BAG ZIPLOCK 12X15 (MISCELLANEOUS) IMPLANT
BLADE SAG 18X100X1.27 (BLADE) ×2 IMPLANT
CAPT HIP TOTAL 2 ×2 IMPLANT
CLOTH BEACON ORANGE TIMEOUT ST (SAFETY) ×2 IMPLANT
COVER PERINEAL POST (MISCELLANEOUS) ×2 IMPLANT
DECANTER SPIKE VIAL GLASS SM (MISCELLANEOUS) ×2 IMPLANT
DRAPE STERI IOBAN 125X83 (DRAPES) ×2 IMPLANT
DRAPE U-SHAPE 47X51 STRL (DRAPES) ×4 IMPLANT
DRSG ADAPTIC 3X8 NADH LF (GAUZE/BANDAGES/DRESSINGS) ×2 IMPLANT
DRSG MEPILEX BORDER 4X4 (GAUZE/BANDAGES/DRESSINGS) ×2 IMPLANT
DRSG MEPILEX BORDER 4X8 (GAUZE/BANDAGES/DRESSINGS) ×2 IMPLANT
DURAPREP 26ML APPLICATOR (WOUND CARE) ×2 IMPLANT
ELECT REM PT RETURN 15FT ADLT (MISCELLANEOUS) ×2 IMPLANT
EVACUATOR 1/8 PVC DRAIN (DRAIN) ×2 IMPLANT
GLOVE BIO SURGEON STRL SZ7.5 (GLOVE) ×2 IMPLANT
GLOVE BIO SURGEON STRL SZ8 (GLOVE) ×4 IMPLANT
GLOVE BIOGEL PI IND STRL 8 (GLOVE) ×2 IMPLANT
GLOVE BIOGEL PI INDICATOR 8 (GLOVE) ×2
GLOVE ECLIPSE 6.5 STRL STRAW (GLOVE) ×8 IMPLANT
GOWN STRL REUS W/TWL LRG LVL3 (GOWN DISPOSABLE) ×2 IMPLANT
GOWN STRL REUS W/TWL XL LVL3 (GOWN DISPOSABLE) ×2 IMPLANT
PACK ANTERIOR HIP CUSTOM (KITS) ×2 IMPLANT
STRIP CLOSURE SKIN 1/2X4 (GAUZE/BANDAGES/DRESSINGS) ×2 IMPLANT
SUT ETHIBOND NAB CT1 #1 30IN (SUTURE) ×2 IMPLANT
SUT MNCRL AB 4-0 PS2 18 (SUTURE) ×2 IMPLANT
SUT STRATAFIX 0 PDS 27 VIOLET (SUTURE) ×2
SUT VIC AB 2-0 CT1 27 (SUTURE) ×2
SUT VIC AB 2-0 CT1 TAPERPNT 27 (SUTURE) ×2 IMPLANT
SUTURE STRATFX 0 PDS 27 VIOLET (SUTURE) ×1 IMPLANT
SYR 50ML LL SCALE MARK (SYRINGE) IMPLANT
TRAY FOLEY W/METER SILVER 16FR (SET/KITS/TRAYS/PACK) ×2 IMPLANT
YANKAUER SUCT BULB TIP 10FT TU (MISCELLANEOUS) ×2 IMPLANT

## 2017-03-07 NOTE — Op Note (Signed)
OPERATIVE REPORT- TOTAL HIP ARTHROPLASTY   PREOPERATIVE DIAGNOSIS: Osteoarthritis of the Right hip.   POSTOPERATIVE DIAGNOSIS: Osteoarthritis of the Right  hip.   PROCEDURE: Right total hip arthroplasty, anterior approach.   SURGEON: Gaynelle Arabian, MD   ASSISTANT: Arlee Muslim, PA-C  ANESTHESIA:  General  ESTIMATED BLOOD LOSS:- 500 ml    DRAINS: Hemovac x1.   COMPLICATIONS: None   CONDITION: PACU - hemodynamically stable.   BRIEF CLINICAL NOTE: Devin Huffman is a 71 y.o. male who has advanced end-  stage arthritis of their Right  hip with progressively worsening pain and  dysfunction.The patient has failed nonoperative management and presents for  total hip arthroplasty.   PROCEDURE IN DETAIL: After successful administration of spinal  anesthetic, the traction boots for the [facility] bed were placed on both  feet and the patient was placed onto the [facility] bed, boots placed into the leg  holders. The Right hip was then isolated from the perineum with plastic  drapes and prepped and draped in the usual sterile fashion. ASIS and  greater trochanter were marked and a oblique incision was made, starting  at about 1 cm lateral and 2 cm distal to the ASIS and coursing towards  the anterior cortex of the femur. The skin was cut with a 10 blade  through subcutaneous tissue to the level of the fascia overlying the  tensor fascia lata muscle. The fascia was then incised in line with the  incision at the junction of the anterior third and posterior 2/3rd. The  muscle was teased off the fascia and then the interval between the TFL  and the rectus was developed. The Hohmann retractor was then placed at  the top of the femoral neck over the capsule. The vessels overlying the  capsule were cauterized and the fat on top of the capsule was removed.  A Hohmann retractor was then placed anterior underneath the rectus  femoris to give exposure to the entire anterior capsule. A T-shaped   capsulotomy was performed. The edges were tagged and the femoral head  was identified.       Osteophytes are removed off the superior acetabulum.  The femoral neck was then cut in situ with an oscillating saw. Traction  was then applied to the left lower extremity utilizing the [facility]  traction. The femoral head was then removed. Retractors were placed  around the acetabulum and then circumferential removal of the labrum was  performed. Osteophytes were also removed. Reaming starts at 47 mm to  medialize and  Increased in 2 mm increments to 51 mm. We reamed in  approximately 40 degrees of abduction, 20 degrees anteversion. A 52 mm  pinnacle acetabular shell was then impacted in anatomic position under  fluoroscopic guidance with excellent purchase. We did not need to place  any additional dome screws. A 32 mm neutral + 4 marathon liner was then  placed into the acetabular shell.       The femoral lift was then placed along the lateral aspect of the femur  just distal to the vastus ridge. The leg was  externally rotated and capsule  was stripped off the inferior aspect of the femoral neck down to the  level of the lesser trochanter, this was done with electrocautery. The femur was lifted after this was performed. The  leg was then placed in an extended and adducted position essentially delivering the femur. We also removed the capsule superiorly and the piriformis from the  piriformis fossa to gain excellent exposure of the  proximal femur. Rongeur was used to remove some cancellous bone to get  into the lateral portion of the proximal femur for placement of the  initial starter reamer. The starter broaches was placed  the starter broach  and was shown to go down the center of the canal. Broaching  with the  [place] system was then performed starting at size 8, coursing  Up to size 13. A size 13 had excellent torsional and rotational  and axial stability. The trial high offset neck was then  placed  with a 32 + 9 trial head. The hip was then reduced. We confirmed that  the stem was in the canal both on AP and lateral x-rays. It also has excellent sizing. The hip was reduced with outstanding stability through full extension and full external rotation.. AP pelvis was taken and the leg lengths were measured and found to be equal. Hip was then dislocated again and the femoral head and neck removed. The  femoral broach was removed. Size 13 [place] stem with a high offset  neck was then impacted into the femur following native anteversion. Has  excellent purchase in the canal. Excellent torsional and rotational and  axial stability. It is confirmed to be in the canal on AP and lateral  fluoroscopic views. The 32 + 9 ceramic head was placed and the hip  reduced with outstanding stability. Again AP pelvis was taken and it  confirmed that the leg lengths were equal. The wound was then copiously  irrigated with saline solution and the capsule reattached and repaired  with Ethibond suture. 30 ml of .25% Bupivicaine was  injected into the capsule and into the edge of the tensor fascia lata as well as subcutaneous tissue. The fascia overlying the tensor fascia lata was then closed with a running #1 V-Loc. Subcu was closed with interrupted 2-0 Vicryl and subcuticular running 4-0 Monocryl. Incision was cleaned  and dried. Steri-Strips and a bulky sterile dressing applied. Hemovac  drain was hooked to suction and then the patient was awakened and transported to  recovery in stable condition.        Please note that a surgical assistant was a medical necessity for this procedure to perform it in a safe and expeditious manner. Assistant was necessary to provide appropriate retraction of vital neurovascular structures and to prevent femoral fracture and allow for anatomic placement of the prosthesis.  Gaynelle Arabian, M.D.

## 2017-03-07 NOTE — Evaluation (Signed)
Physical Therapy Evaluation Patient Details Name: Devin Huffman MRN: [id number] DOB: January 07, 1946 Today's Date: 03/07/2017   History of Present Illness  Pt s/p R THR and with hx of L4-5 fusion (14)  Clinical Impression  Pt s/p R THR and presents with decreased R LE strength/ROM and post op pain limiting functional mobility.  Pt should progress to dc home with family assist.    Follow Up Recommendations Home health PT    Equipment Recommendations  None recommended by PT    Recommendations for Other Services       Precautions / Restrictions Precautions Precautions: Fall Restrictions Weight Bearing Restrictions: No Other Position/Activity Restrictions: WBAT      Mobility  Bed Mobility Overal bed mobility: Needs Assistance Bed Mobility: Supine to Sit     Supine to sit: Min assist     General bed mobility comments: cues for sequence and use of L LE to self assist  Transfers Overall transfer level: Needs assistance Equipment used: Rolling walker (2 wheeled) Transfers: Sit to/from Stand Sit to Stand: Min assist         General transfer comment: cues for LE management and use of UEs to self assist  Ambulation/Gait Ambulation/Gait assistance: Min assist Ambulation Distance (Feet): 28 Feet Assistive device: Rolling walker (2 wheeled) Gait Pattern/deviations: Step-to pattern;Decreased step length - right;Decreased step length - left;Shuffle;Trunk flexed Gait velocity: decr Gait velocity interpretation: Below normal speed for age/gender General Gait Details: cues for sequence, posture and position from [employer]            Wheelchair Mobility    Modified Rankin (Stroke Patients Only)       Balance                                             Pertinent Vitals/Pain Pain Assessment: 0-10 Pain Score: 7  Pain Location: R hip with ambulation Pain Descriptors / Indicators: Aching;Sore Pain Intervention(s): Limited activity within  patient's tolerance;Monitored during session;Premedicated before session;Ice applied    Home Living Family/patient expects to be discharged to:: Private residence Living Arrangements: Spouse/significant other Available Help at Discharge: Family Type of Home: House Home Access: Stairs to enter Entrance Stairs-Rails: Right Entrance Stairs-Number of Steps: 3 without rail vs 7 with [place]: One level Home Equipment: [place] - 2 wheels;Bedside commode      Prior Function Level of Independence: Independent               Hand Dominance        Extremity/Trunk Assessment   Upper Extremity Assessment Upper Extremity Assessment: Overall WFL for tasks assessed    Lower Extremity Assessment Lower Extremity Assessment: RLE deficits/detail    Cervical / Trunk Assessment Cervical / Trunk Assessment: Normal  Communication   Communication: No difficulties  Cognition Arousal/Alertness: Awake/alert Behavior During Therapy: WFL for tasks assessed/performed Overall Cognitive Status: Within Functional Limits for tasks assessed                                        General Comments      Exercises     Assessment/Plan    PT Assessment Patient needs continued PT services  PT Problem List Decreased strength;Decreased range of motion;Decreased activity tolerance;Decreased mobility;Decreased knowledge of use of DME;Pain;Decreased knowledge of precautions  PT Treatment Interventions DME instruction;Gait training;Stair training;Functional mobility training;Therapeutic activities;Therapeutic exercise;Patient/family education    PT Goals (Current goals can be found in the Care Plan section)  Acute Rehab PT Goals Patient Stated Goal: Regain IND and walk without pain PT Goal Formulation: With patient Time For Goal Achievement: 03/10/17 Potential to Achieve Goals: Good    Frequency 7X/week   Barriers to discharge        Co-evaluation                End of Session Equipment Utilized During Treatment: Gait belt Activity Tolerance: Patient tolerated treatment well Patient left: in chair;with call bell/phone within reach;with family/visitor present Nurse Communication: Mobility status PT Visit Diagnosis: Difficulty in walking, not elsewhere classified (R26.2)    Time: 1710-1735 PT Time Calculation (min) (ACUTE ONLY): 25 min   Charges:   PT Evaluation $PT Eval Low Complexity: 1 Procedure PT Treatments $Gait Training: 8-22 mins   PT G Codes:          Daley Mooradian 03/07/2017, 6:27 PM

## 2017-03-07 NOTE — Transfer of Care (Signed)
Immediate Anesthesia Transfer of Care Note  Patient: Devin Huffman  Procedure(s) Performed: Procedure(s): RIGHT TOTAL HIP ARTHROPLASTY ANTERIOR APPROACH (Right)  Patient Location: PACU  Anesthesia Type:General  Level of Consciousness: awake, alert , oriented and patient cooperative  Airway & Oxygen Therapy: Patient Spontanous Breathing and Patient connected to face mask oxygen  Post-op Assessment: Report given to RN, Post -op Vital signs reviewed and stable and Patient moving all extremities X 4  Post vital signs: stable  Last Vitals:  Vitals:   03/07/17 0833  BP: (!) 167/90  Pulse: 71  Resp: 18  Temp: 37 C    Last Pain:  Vitals:   03/07/17 0833  TempSrc: Oral      Patients Stated Pain Goal: 4 (04/88/89 1694)  Complications: No apparent anesthesia complications

## 2017-03-07 NOTE — Interval H&P Note (Signed)
History and Physical Interval Note:  03/07/2017 8:43 AM  Devin Huffman  has presented today for surgery, with the diagnosis of right hip osteoarthritis  The various methods of treatment have been discussed with the patient and family. After consideration of risks, benefits and other options for treatment, the patient has consented to  Procedure(s): RIGHT TOTAL HIP ARTHROPLASTY ANTERIOR APPROACH (Right) as a surgical intervention .  The patient's history has been reviewed, patient examined, no change in status, stable for surgery.  I have reviewed the patient's chart and labs.  Questions were answered to the patient's satisfaction.     Gearlean Alf

## 2017-03-07 NOTE — H&P (View-Only) (Signed)
Devin Huffman DOB: [birth year] Married / Language: English / Race: White Male Date of Admission:  03/07/2017 CC:  Right Hip Pain History of Present Illness  The patient is a 71 year old male who comes in  for a preoperative History and Physical. The patient is scheduled for a right total hip arthroplasty (anterior) to be performed by Dr. Dione Plover. Aluisio, MD at [facility] on 03-07-2017. The patient reports right hip problems including pain symptoms that have been present for years. The symptoms began without any known injury. Patient states that his right hip has always bothered him. 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 He said that within the last few weeks it has progressed and is unable to walk without a cane. 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 He is taking Ibuprofen twice daily. He is unable to lay on his right side and the pain is worse with weightbearing. He has pain that radiates down his thigh. Patient also notes that his right leg is shorter than the left. He has a lift in his right shoe. He is at a point where he cannot do the things that he wants or needs to do. Devin Huffman has had severe pain in the right hip for several months now but had problems with the hip for many years, getting progressively worse up until this point where it is becoming unbearable the past four weeks. Pain is in his groin, lateral hip radiating down the anterior thigh to his knee. It is occurring with all activities. It is worse with activity but also even occurring at rest. He is able to sleep at night once he gets a comfortable position. His left hip does not hurt. 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 He would like to get the hip fixed at this time. They have been treated conservatively in the past for the above stated problem and despite conservative measures, they continue to have  progressive pain and severe functional limitations and dysfunction. They have failed non-operative management including home exercise, medications. It is felt that they would benefit from undergoing total joint replacement. Risks and benefits of the procedure have been discussed with the patient and they elect to proceed with surgery. There are no active contraindications to surgery such as ongoing infection or rapidly progressive neurological disease.  Problem List/Past Medical  Foot pain [phone])  Primary osteoarthritis of right hip (M16.11)  Gastroesophageal Reflux Disease  Gout  Hypercholesterolemia  High blood pressure  Measles  Mumps   Allergies No Known Drug Allergies  Family History Cancer  mother and sister mother Cerebrovascular Accident  grandfather mothers side Hypertension  mother and sister Rheumatoid Arthritis  grandmother mothers side  Social History Alcohol use  never consumed alcohol Children  2 Current work status  retired Engineer, agricultural (Currently)  no Drug/Alcohol Rehab (Previously)  no Exercise  Exercises rarely; does other Exercises never; does other Illicit drug use  no Living situation  live with spouse Marital status  married Number of flights of stairs before winded  2-3 Pain Contract  no Tobacco / smoke exposure  no Tobacco use  never smoker  Medication History  Aspirin (81MG  Tablet, Oral) Active. AmLODIPine Besylate (5MG  Tablet, Oral) Active. Metoprolol Succinate ER (25MG  Tablet ER 24HR, Oral) Active. Ibuprofen (Oral) Specific strength unknown - Active. Indomethacin (50MG  Capsule, Oral)  Active.  Past Surgical History Inguinal Hernia Repair  laparoscopic: bilateral Spinal Surgery  Tonsillectomy  Vasectomy   Review of Systems  General Not Present- Chills, Fatigue, Fever, Memory Loss, Night Sweats, Weight Gain and Weight Loss. Skin Not Present- Eczema, Hives, Itching, Lesions and Rash. HEENT Not  Present- Dentures, Double Vision, Headache, Hearing Loss, Tinnitus and Visual Loss. Respiratory Not Present- Allergies, Chronic Cough, Coughing up blood, Shortness of breath at rest and Shortness of breath with exertion. Cardiovascular Not Present- Chest Pain, Difficulty Breathing Lying Down, Murmur, Palpitations, Racing/skipping heartbeats and Swelling. Gastrointestinal Not Present- Abdominal Pain, Bloody Stool, Constipation, Diarrhea, Difficulty Swallowing, Heartburn, Jaundice, Loss of appetitie, Nausea and Vomiting. Male Genitourinary Not Present- Blood in Urine, Discharge, Flank Pain, Incontinence, Painful Urination, Urgency, Urinary frequency, Urinary Retention, Urinating at Night and Weak urinary stream. Musculoskeletal Present- Joint Pain. Not Present- Back Pain, Joint Swelling, Morning Stiffness, Muscle Pain, Muscle Weakness and Spasms. Neurological Not Present- Blackout spells, Difficulty with balance, Dizziness, Paralysis, Tremor and Weakness. Psychiatric Not Present- Insomnia.  Vitals  Weight: 225 lb Height: 71in Weight was reported by patient. Body Surface Area: 2.22 m Body Mass Index: 31.38 kg/m  Pulse: 84 (Regular)  BP: 142/88 (Sitting, Left Arm, Standard)  Physical Exam  General Mental Status -Alert, cooperative and good historian. General Appearance-pleasant, Not in acute distress. Orientation-Oriented X3. Build & Nutrition-Well nourished and Well developed.  Head and Neck Head-normocephalic, atraumatic . Neck Global Assessment - supple, no bruit auscultated on the right, no bruit auscultated on the left.  Eye Vision-Wears corrective lenses(as needed). Pupil - Bilateral-Regular and Round. Motion - Bilateral-EOMI.  Chest and Lung Exam Auscultation Breath sounds - clear at anterior chest wall and clear at posterior chest wall. Adventitious sounds - No Adventitious sounds.  Cardiovascular Auscultation Rhythm - Regular rate and rhythm.  Heart Sounds - S1 WNL and S2 WNL. Murmurs & Other Heart Sounds - Auscultation of the heart reveals - No Murmurs.  Abdomen Palpation/Percussion Tenderness - Abdomen is non-tender to palpation. Rigidity (guarding) - Abdomen is soft. Auscultation Auscultation of the abdomen reveals - Bowel sounds normal.  Male Genitourinary Note: Not done, not pertinent to present illness   Musculoskeletal Note: On exam, well-developed male, alert and oriented, in no apparent distress. Left hip full range of motion without discomfort. Right hip flexion 120, rotation in 20, out 30, abduction 30 with discomfort on rotational activities. His knee exam is normal. Pulse, sensation, motor are intact. Significantly antalgic gait on the right.  His radiographs, AP pelvis and lateral of the right hip showed the left hip has minimal change. Right hip is nearly bone-on-bone. He has got femoral head deformity consistent with old Perthes disease. He is at least a half inch short on the right compared to the left.  Assessment & Plan Primary osteoarthritis of right hip (M16.11)  Note:Surgical Plans: Right Total Hip Replacement - Anterior Approach  Disposition: Home with Wife  PCP: Dr. Danise Mina - Pending  IV TXA  Anesthesia Issues: None  Patient was instructed on what medications to stop prior to surgery.  Signed electronically by Joelene Millin, III PA-C

## 2017-03-07 NOTE — Anesthesia Preprocedure Evaluation (Addendum)
Anesthesia Evaluation  Patient identified by MRN, date of birth, ID band Patient awake    Reviewed: Allergy & Precautions, H&P , NPO status , Patient's Chart, lab work & pertinent test results, reviewed documented beta blocker date and time   History of Anesthesia Complications Negative for: history of anesthetic complications  Airway Mallampati: II       Dental  (+) Teeth Intact, Dental Advisory Given   Pulmonary neg pulmonary ROS,    breath sounds clear to auscultation       Cardiovascular hypertension, Pt. on medications and Pt. on home beta blockers negative cardio ROS   Rhythm:Regular Rate:Normal     Neuro/Psych  Neuromuscular disease (pain, weakness, numbness RLE>LLE) negative neurological ROS  negative psych ROS   GI/Hepatic negative GI ROS, Neg liver ROS, GERD  Medicated and Controlled,  Endo/Other  negative endocrine ROSdiabetes (elevated A1C; diet controlled)  Renal/GU negative Renal ROS  negative genitourinary   Musculoskeletal negative musculoskeletal ROS (+) Arthritis , Osteoarthritis,  Gout   Abdominal (+) + obese,   Peds negative pediatric ROS (+)  Hematology negative hematology ROS (+)   Anesthesia Other Findings   Reproductive/Obstetrics negative OB ROS                             Anesthesia Physical  Anesthesia Plan  ASA: II  Anesthesia Plan: General   Post-op Pain Management:    Induction: Intravenous  Airway Management Planned: LMA  Additional Equipment:   Intra-op Plan:   Post-operative Plan:   Informed Consent: I have reviewed the patients History and Physical, chart, labs and discussed the procedure including the risks, benefits and alternatives for the proposed anesthesia with the patient or authorized representative who has indicated his/her understanding and acceptance.   Dental advisory given  Plan Discussed with: CRNA, Anesthesiologist and  Surgeon  Anesthesia Plan Comments:        Anesthesia Quick Evaluation

## 2017-03-07 NOTE — Anesthesia Procedure Notes (Signed)
Procedure Name: LMA Insertion Date/Time: 03/07/2017 9:44 AM Performed by: Dione Booze Pre-anesthesia Checklist: Patient identified, Emergency Drugs available, Suction available and Patient being monitored Patient Re-evaluated:Patient Re-evaluated prior to inductionOxygen Delivery Method: Circle system utilized Preoxygenation: Pre-oxygenation with 100% oxygen Intubation Type: IV induction LMA: LMA with gastric port inserted LMA Size: 4.0 Number of attempts: 1 Placement Confirmation: positive ETCO2 and breath sounds checked- equal and bilateral Tube secured with: Tape Dental Injury: Teeth and Oropharynx as per pre-operative assessment

## 2017-03-07 NOTE — Anesthesia Postprocedure Evaluation (Signed)
Anesthesia Post Note  Patient: Devin Huffman  Procedure(s) Performed: Procedure(s) (LRB): RIGHT TOTAL HIP ARTHROPLASTY ANTERIOR APPROACH (Right)  Patient location during evaluation: PACU Anesthesia Type: MAC Level of consciousness: awake and alert Pain management: pain level controlled Vital Signs Assessment: post-procedure vital signs reviewed and stable Respiratory status: spontaneous breathing, nonlabored ventilation and respiratory function stable Cardiovascular status: blood pressure returned to baseline and stable Postop Assessment: no signs of nausea or vomiting Anesthetic complications: no       Last Vitals:  Vitals:   03/07/17 1200 03/07/17 1215  BP: (!) 161/97 (!) 151/92  Pulse: 90 88  Resp: 12 16  Temp:      Last Pain:  Vitals:   03/07/17 1230  TempSrc:   PainSc: [place]

## 2017-03-08 LAB — CBC
HCT: 39.5 % (ref 39.0–52.0)
Hemoglobin: 13.2 g/dL (ref 13.0–17.0)
MCH: 32.8 pg (ref 26.0–34.0)
MCHC: 33.4 g/dL (ref 30.0–36.0)
MCV: 98 fL (ref 78.0–100.0)
PLATELETS: 235 10*3/uL (ref 150–400)
RBC: 4.03 MIL/uL — AB (ref 4.22–5.81)
RDW: 12.6 % (ref 11.5–15.5)
WBC: 17.7 10*3/uL — AB (ref 4.0–10.5)

## 2017-03-08 LAB — BASIC METABOLIC PANEL
ANION GAP: 10 (ref 5–15)
BUN: 14 mg/dL (ref 6–20)
CALCIUM: 8.7 mg/dL — AB (ref 8.9–10.3)
CO2: 23 mmol/L (ref 22–32)
Chloride: 103 mmol/L (ref 101–111)
Creatinine, Ser: 0.9 mg/dL (ref 0.61–1.24)
Glucose, Bld: 144 mg/dL — ABNORMAL HIGH (ref 65–99)
POTASSIUM: 4.8 mmol/L (ref 3.5–5.1)
Sodium: 136 mmol/L (ref 135–145)

## 2017-03-08 MED ORDER — METHOCARBAMOL 500 MG PO TABS: 500.0000 mg | ORAL_TABLET | Freq: Four times a day (QID) | ORAL | 0 refills | Status: DC | PRN

## 2017-03-08 MED ORDER — RIVAROXABAN 10 MG PO TABS: 10.0000 mg | ORAL_TABLET | Freq: Every day | ORAL | 0 refills | Status: DC

## 2017-03-08 MED ORDER — ALUM & MAG HYDROXIDE-SIMETH 200-200-20 MG/5ML PO SUSP
30.0000 mL | Freq: Once | ORAL | Status: AC
  Administered 2017-03-08: 30 mL via ORAL
  Filled 2017-03-08: qty 30

## 2017-03-08 MED ORDER — OMEPRAZOLE 20 MG PO CPDR
20.0000 mg | DELAYED_RELEASE_CAPSULE | Freq: Every day | ORAL | Status: DC | PRN
  Administered 2017-03-08: 20 mg via ORAL
  Filled 2017-03-08: qty 1

## 2017-03-08 MED ORDER — OXYCODONE HCL 5 MG PO TABS: 5.0000 mg | ORAL_TABLET | ORAL | 0 refills | Status: DC | PRN

## 2017-03-08 MED ORDER — TRAMADOL HCL 50 MG PO TABS: 50.0000 mg | ORAL_TABLET | Freq: Four times a day (QID) | ORAL | 0 refills | Status: DC | PRN

## 2017-03-08 MED ORDER — NON FORMULARY: 20.0000 mg | Freq: Every day | Status: DC | PRN

## 2017-03-08 MED ORDER — ALUM & MAG HYDROXIDE-SIMETH 200-200-20 MG/5ML PO SUSP: 30.0000 mL | ORAL | Status: DC | PRN

## 2017-03-08 NOTE — Progress Notes (Signed)
qPhysical Therapy Treatment Patient Details Name: Devin Huffman MRN: [id number] DOB: 1946-03-29 Today's Date: 03/08/2017    History of Present Illness Pt s/p R THR and with hx of L4-5 fusion (14)    PT Comments    Pt progressing well with mobility.  Reviewed stairs, shower transfers and car transfers.  Pt eager for dc home.   Follow Up Recommendations  Home health PT     Equipment Recommendations  None recommended by PT    Recommendations for Other Services       Precautions / Restrictions Precautions Precautions: Fall Restrictions Weight Bearing Restrictions: No Other Position/Activity Restrictions: WBAT    Mobility  Bed Mobility Overal bed mobility: Needs Assistance Bed Mobility: Sit to Supine     Supine to sit: Min assist Sit to supine: Min guard   General bed mobility comments: cues for sequence and use of L LE to self assist  Transfers Overall transfer level: Needs assistance Equipment used: Rolling walker (2 wheeled) Transfers: Sit to/from Stand Sit to Stand: Supervision         General transfer comment: cues for LE management and use of UEs to self assist  Ambulation/Gait Ambulation/Gait assistance: Min guard;Supervision Ambulation Distance (Feet): 222 Feet Assistive device: Rolling walker (2 wheeled) Gait Pattern/deviations: Step-to pattern;Decreased step length - right;Decreased step length - left;Shuffle;Trunk flexed Gait velocity: decr Gait velocity interpretation: Below normal speed for age/gender General Gait Details: min cues for sequence, posture and position from RW   Stairs Stairs: Yes   Stair Management: One rail Right;Step to pattern;Forwards;With cane Number of Stairs: 4 General stair comments: min cues for sequence and foot/cane placement  Wheelchair Mobility    Modified Rankin (Stroke Patients Only)       Balance                                            Cognition Arousal/Alertness:  Awake/alert Behavior During Therapy: WFL for tasks assessed/performed Overall Cognitive Status: Within Functional Limits for tasks assessed                                        Exercises      General Comments        Pertinent Vitals/Pain Pain Assessment: 0-10 Pain Score: 5  Pain Location: R hip with ambulation Pain Descriptors / Indicators: Aching;Sore Pain Intervention(s): Limited activity within patient's tolerance;Monitored during session;Premedicated before session;Ice applied    Home Living                      Prior Function            PT Goals (current goals can now be found in the care plan section) Acute Rehab PT Goals Patient Stated Goal: Regain IND and walk without pain PT Goal Formulation: With patient Time For Goal Achievement: 03/10/17 Potential to Achieve Goals: Good Progress towards PT goals: Progressing toward goals    Frequency    7X/week      PT Plan Current plan remains appropriate    Co-evaluation             End of Session Equipment Utilized During Treatment: Gait belt Activity Tolerance: Patient tolerated treatment well Patient left: in chair;with call bell/phone within reach;with family/visitor present Nurse Communication: Mobility status  PT Visit Diagnosis: Difficulty in walking, not elsewhere classified (R26.2)     Time: 1430-1500 PT Time Calculation (min) (ACUTE ONLY): 30 min  Charges:  $Gait Training: 8-22 mins $Therapeutic Exercise: 8-22 mins $Therapeutic Activity: 8-22 mins                    G Codes:       Devin Huffman 04-05-2017, 3:34 PM

## 2017-03-08 NOTE — Discharge Summary (Signed)
Physician Discharge Summary   Patient ID: Devin Huffman MRN: [id number] DOB/AGE: [birth year] 73 y.o.  Admit date: 03/07/2017 Discharge date: 03-08-2017  Primary Diagnosis:  Osteoarthritis of the Right hip.  Admission Diagnoses:  Past Medical History:  Diagnosis Date  . 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 Cerumen impaction 2016   bilateral with otitis externa s/p ENT removal - [facility]  . GERD (gastroesophageal reflux disease)    occasional  . Gout    ?due to L great toe pain, saw Dr Gladstone Lighter  . History of chicken pox   . Hyperlipidemia   . Hypertension   . Legg-Perthes disease    Right hip  . Nasal septal deviation    monitoring - [facility]  . Pre-diabetes    ? borderline  . Prediabetes 03/22/2009   Discharge Diagnoses:   Principal Problem:   OA (osteoarthritis) of hip  Estimated body mass index is 31.33 kg/m as calculated from the following:   Height as of this encounter: 6' (1.829 m).   Weight as of this encounter: 104.8 kg (231 lb).  Procedure(s) (LRB): RIGHT TOTAL HIP ARTHROPLASTY ANTERIOR APPROACH (Right)   Consults: None  HPI: Devin Huffman is a 71 y.o. male who has advanced end-  stage arthritis of their Right  hip with progressively worsening pain and  dysfunction.The patient has failed nonoperative management and presents for  total hip arthroplasty.   Laboratory Data: Admission on 03/07/2017  Component Date Value Ref Range Status  . WBC 03/08/2017 17.7* 4.0 - 10.5 K/uL Final  . RBC 03/08/2017 4.03* 4.22 - 5.81 MIL/uL Final  . Hemoglobin 03/08/2017 13.2  13.0 - 17.0 g/dL Final  . HCT 03/08/2017 39.5  39.0 - 52.0 % Final  . MCV 03/08/2017 98.0  78.0 - 100.0 fL Final  . MCH 03/08/2017 32.8  26.0 - 34.0 pg Final  . MCHC 03/08/2017 33.4  30.0 - 36.0 g/dL Final  . RDW 03/08/2017 12.6  11.5 - 15.5 % Final  . Platelets 03/08/2017 235  150 - 400 K/uL Final  . Sodium 03/08/2017 136  135 - 145 mmol/L Final  . Potassium 03/08/2017 4.8  3.5 - 5.1  mmol/L Final  . Chloride 03/08/2017 103  101 - 111 mmol/L Final  . CO2 03/08/2017 23  22 - 32 mmol/L Final  . Glucose, Bld 03/08/2017 144* 65 - 99 mg/dL Final  . BUN 03/08/2017 14  6 - 20 mg/dL Final  . Creatinine, Ser 03/08/2017 0.90  0.61 - 1.24 mg/dL Final  . Calcium 03/08/2017 8.7* 8.9 - 10.3 mg/dL Final  . GFR calc non Af Amer 03/08/2017 >60  >60 mL/min Final  . GFR calc Af Amer 03/08/2017 >60  >60 mL/min Final   Comment: (NOTE) The eGFR has been calculated using the CKD EPI equation. This calculation has not been validated in all clinical situations. eGFR's persistently <60 mL/min signify possible Chronic Kidney Disease.   Georgiann Hahn gap 03/08/2017 10  5 - 15 Final  Hospital Outpatient Visit on 02/28/2017  Component Date Value Ref Range Status  . aPTT 02/28/2017 27  24 - 36 seconds Final  . WBC 02/28/2017 8.5  4.0 - 10.5 K/uL Final  . RBC 02/28/2017 4.91  4.22 - 5.81 MIL/uL Final  . Hemoglobin 02/28/2017 15.9  13.0 - 17.0 g/dL Final  . HCT 02/28/2017 46.5  39.0 - 52.0 % Final  . MCV 02/28/2017 94.7  78.0 - 100.0 fL Final  . MCH 02/28/2017 32.4  26.0 - 34.0 pg Final  . MCHC 02/28/2017 34.2  30.0 - 36.0 g/dL Final  . RDW 02/28/2017 12.4  11.5 - 15.5 % Final  . Platelets 02/28/2017 268  150 - 400 K/uL Final  . Sodium 02/28/2017 139  135 - 145 mmol/L Final  . Potassium 02/28/2017 4.2  3.5 - 5.1 mmol/L Final  . Chloride 02/28/2017 104  101 - 111 mmol/L Final  . CO2 02/28/2017 26  22 - 32 mmol/L Final  . Glucose, Bld 02/28/2017 144* 65 - 99 mg/dL Final  . BUN 02/28/2017 20  6 - 20 mg/dL Final  . Creatinine, Ser 02/28/2017 0.96  0.61 - 1.24 mg/dL Final  . Calcium 02/28/2017 9.6  8.9 - 10.3 mg/dL Final  . Total Protein 02/28/2017 7.3  6.5 - 8.1 g/dL Final  . Albumin 02/28/2017 4.0  3.5 - 5.0 g/dL Final  . AST 02/28/2017 19  15 - 41 U/L Final  . ALT 02/28/2017 25  17 - 63 U/L Final  . Alkaline Phosphatase 02/28/2017 96  38 - 126 U/L Final  . Total Bilirubin 02/28/2017 0.6  0.3 -  1.2 mg/dL Final  . GFR calc non Af Amer 02/28/2017 >60  >60 mL/min Final  . GFR calc Af Amer 02/28/2017 >60  >60 mL/min Final   Comment: (NOTE) The eGFR has been calculated using the CKD EPI equation. This calculation has not been validated in all clinical situations. eGFR's persistently <60 mL/min signify possible Chronic Kidney Disease.   . Anion gap 02/28/2017 9  5 - 15 Final  . Prothrombin Time 02/28/2017 12.7  11.4 - 15.2 seconds Final  . INR 02/28/2017 0.95   Final  . ABO/RH(D) 02/28/2017 O POS   Final  . Antibody Screen 02/28/2017 NEG   Final  . Sample Expiration 02/28/2017 03/10/2017   Final  . Extend sample reason 02/28/2017 NO TRANSFUSIONS OR PREGNANCY IN THE PAST 3 MONTHS   Final  . Hgb A1c MFr Bld 02/28/2017 6.3* 4.8 - 5.6 % Final   Comment: (NOTE)         Pre-diabetes: 5.7 - 6.4         Diabetes: >6.4         Glycemic control for adults with diabetes: <7.0   . Mean Plasma Glucose 02/28/2017 134  mg/dL Final   Comment: (NOTE) Performed At: [facility] [place], [place] [id number] Lindon Romp MD EL:[id number]   . MRSA, PCR 02/28/2017 NEGATIVE  NEGATIVE Final  . Staphylococcus aureus 02/28/2017 NEGATIVE  NEGATIVE Final   Comment:        The Xpert SA Assay (FDA approved for NASAL specimens in patients over 35 years of age), is one component of a comprehensive surveillance program.  Test performance has been validated by [facility] for patients greater than or equal to 74 year old. It is not intended to diagnose infection nor to guide or monitor treatment.   . ABO/RH(D) 02/28/2017 O POS   Final     X-Rays:Dg Chest 2 View  Result Date: 02/22/2017 CLINICAL DATA:  Preoperative chest evaluation prior to right hip replacement. EXAM: CHEST  2 VIEW COMPARISON:  04/15/2013 and prior radiographs FINDINGS: Mild cardiomegaly and tortuosity/ectasia of the ascending aorta again noted. There is no evidence of focal airspace disease, pulmonary  edema, suspicious pulmonary nodule/mass, pleural effusion, or pneumothorax. No acute bony abnormalities are identified. IMPRESSION: Mild cardiomegaly without evidence of acute cardiopulmonary disease. Electronically Signed   By: Cleatis Polka.D.  On: 02/22/2017 13:23   Dg Pelvis Portable  Result Date: 03/07/2017 CLINICAL DATA:  Hip replacement. EXAM: PORTABLE PELVIS 1-2 VIEWS COMPARISON:  04/23/2013 . FINDINGS: Lumbar spine fusion. Total right hip replacement. Hardware intact. Anatomic alignment. Pelvic calcifications consistent phleboliths. Prostate calcifications. IMPRESSION: Lumbar spine fusion. Total right hip replacement. Hardware intact. Anatomic alignment . Electronically Signed   By: Marcello Moores  Register   On: 03/07/2017 13:09   Dg C-arm 1-60 Min-no Report  Result Date: 03/07/2017 Fluoroscopy was utilized by the requesting physician.  No radiographic interpretation.    EKG: Orders placed or performed in visit on 02/22/17  . EKG 12-Lead     Hospital Course: Patient was admitted to [facility] and taken to the OR and underwent the above state procedure without complications.  Patient tolerated the procedure well and was later transferred to the recovery room and then to the orthopaedic floor for postoperative care.  They were given PO and IV analgesics for pain control following their surgery.  They were given 24 hours of postoperative antibiotics of  Anti-infectives    Start     Dose/Rate Route Frequency Ordered Stop   03/07/17 1600  ceFAZolin (ANCEF) IVPB 2g/100 mL premix     2 g 200 mL/hr over 30 Minutes Intravenous Every 6 hours 03/07/17 1339 03/07/17 2211   03/07/17 0832  ceFAZolin (ANCEF) 2-4 GM/100ML-% IVPB    Comments:  Harvell, Gwendolyn  : cabinet override      03/07/17 0832 03/07/17 0948   03/07/17 0810  ceFAZolin (ANCEF) IVPB 2g/100 mL premix     2 g 200 mL/hr over 30 Minutes Intravenous On call to O.R. 03/07/17 0277 03/07/17 0948     and started on DVT  prophylaxis in the form of Xarelto.   PT and OT were ordered for total hip protocol.  The patient was allowed to be WBAT with therapy. Discharge planning was consulted to help with postop disposition and equipment needs.  Patient had a decent night on the evening of surgery.  They started to get up OOB with therapy on day one.  Hemovac drain was pulled without difficulty. Patient was seen in rounds on day one by Dr. Wynelle Link.  They wanted to go home.  Arrangements were made, completed therapy and was ready to go home later that same day.   Discharge home with home health Diet - Cardiac diet and Diabetic diet Follow up - in 2 weeks Activity - WBAT Disposition - Home Condition Upon Discharge - stable D/C Meds - See DC Summary DVT Prophylaxis - Xarelto  Discharge Instructions    Call MD / Call 911    Complete by:  As directed    If you experience chest pain or shortness of breath, CALL 911 and be transported to the hospital emergency room.  If you develope a fever above 101 F, pus (white drainage) or increased drainage or redness at the wound, or calf pain, call your surgeon's office.   Change dressing    Complete by:  As directed    You may change your dressing dressing daily with sterile 4 x 4 inch gauze dressing and paper tape.  Do not submerge the incision under water.   Constipation Prevention    Complete by:  As directed    Drink plenty of fluids.  Prune juice may be helpful.  You may use a stool softener, such as Colace (over the counter) 100 mg twice a day.  Use MiraLax (over the counter) for constipation as  needed.   Diet - low sodium heart healthy    Complete by:  As directed    Discharge instructions    Complete by:  As directed    Pick up stool softner and laxative for home use following surgery while on pain medications. Do not submerge incision under water. Please use good hand washing techniques while changing dressing each day. May shower starting three days after  surgery. Please use a clean towel to pat the incision dry following showers. Continue to use ice for pain and swelling after surgery. Do not use any lotions or creams on the incision until instructed by your surgeon.  Wear both TED hose on both legs during the day every day for three weeks, but may have off at night at home.  Postoperative Constipation Protocol  Constipation - defined medically as fewer than three stools per week and severe constipation as less than one stool per week.  One of the most common issues patients have following surgery is constipation.  Even if you have a regular bowel pattern at home, your normal regimen is likely to be disrupted due to multiple reasons following surgery.  Combination of anesthesia, postoperative narcotics, change in appetite and fluid intake all can affect your bowels.  In order to avoid complications following surgery, here are some recommendations in order to help you during your recovery period.  Colace (docusate) - Pick up an over-the-counter form of Colace or another stool softener and take twice a day as long as you are requiring postoperative pain medications.  Take with a full glass of water daily.  If you experience loose stools or diarrhea, hold the colace until you stool forms back up.  If your symptoms do not get better within 1 week or if they get worse, check with your doctor.  Dulcolax (bisacodyl) - Pick up over-the-counter and take as directed by the product packaging as needed to assist with the movement of your bowels.  Take with a full glass of water.  Use this product as needed if not relieved by Colace only.   MiraLax (polyethylene glycol) - Pick up over-the-counter to have on hand.  MiraLax is a solution that will increase the amount of water in your bowels to assist with bowel movements.  Take as directed and can mix with a glass of water, juice, soda, coffee, or tea.  Take if you go more than two days without a movement. Do not  use MiraLax more than once per day. Call your doctor if you are still constipated or irregular after using this medication for 7 days in a row.  If you continue to have problems with postoperative constipation, please contact the office for further assistance and recommendations.  If you experience "the worst abdominal pain ever" or develop nausea or vomiting, please contact the office immediatly for further recommendations for treatment.   Take Xarelto for two and a half more weeks, then discontinue Xarelto. Once the patient has completed the Xarelto, they may resume the 81 mg Aspirin.   Do not sit on low chairs, stoools or toilet seats, as it may be difficult to get up from low surfaces    Complete by:  As directed    Driving restrictions    Complete by:  As directed    No driving until released by the physician.   Increase activity slowly as tolerated    Complete by:  As directed    Lifting restrictions    Complete by:  As  directed    No lifting until released by the physician.   Patient may shower    Complete by:  As directed    You may shower without a dressing once there is no drainage.  Do not wash over the wound.  If drainage remains, do not shower until drainage stops.   TED hose    Complete by:  As directed    Use stockings (TED hose) for 3 weeks on both leg(s).  You may remove them at night for sleeping.   Weight bearing as tolerated    Complete by:  As directed    Laterality:  right   Extremity:  Lower     Allergies as of 03/08/2017      Reactions   Lisinopril Hives, Swelling   ?angioedema - lip swelling, hives      Medication List    STOP taking these medications   aspirin EC 81 MG tablet   Fish Oil 1000 MG Caps   ibuprofen 200 MG tablet Commonly known as:  ADVIL,MOTRIN   indomethacin 50 MG capsule Commonly known as:  INDOCIN   vitamin B-12 500 MCG tablet Commonly known as:  CYANOCOBALAMIN   vitamin C 500 MG tablet Commonly known as:  ASCORBIC ACID      TAKE these medications   amLODipine 5 MG tablet Commonly known as:  NORVASC Take 1 tablet (5 mg total) by mouth daily. What changed:  when to take this   atorvastatin 20 MG tablet Commonly known as:  LIPITOR Take 1 tablet (20 mg total) by mouth daily.   methocarbamol 500 MG tablet Commonly known as:  ROBAXIN Take 1 tablet (500 mg total) by mouth every 6 (six) hours as needed for muscle spasms.   metoprolol succinate 25 MG 24 hr tablet Commonly known as:  TOPROL-XL Take 1 tablet (25 mg total) by mouth daily. What changed:  when to take this   omeprazole 20 MG capsule Commonly known as:  PRILOSEC TAKE ONE CAPSULE BY MOUTH ONCE DAILY AS NEEDED What changed:  See the new instructions.   oxyCODONE 5 MG immediate release tablet Commonly known as:  Oxy IR/ROXICODONE Take 1-2 tablets (5-10 mg total) by mouth every 4 (four) hours as needed for moderate pain or severe pain.   rivaroxaban 10 MG Tabs tablet Commonly known as:  XARELTO Take 1 tablet (10 mg total) by mouth daily with breakfast. Take Xarelto for two and a half more weeks following discharge from the hospital, then discontinue Xarelto. Once the patient has completed the Xarelto, they may resume the 81 mg Aspirin.   traMADol 50 MG tablet Commonly known as:  ULTRAM Take 1-2 tablets (50-100 mg total) by mouth every 6 (six) hours as needed for moderate pain.      Follow-up Information    Gearlean Alf, MD. Schedule an appointment as soon as possible for a visit on 03/20/2017.   Specialty:  Orthopedic Surgery Contact information: [address] [place] [address] [phone]           Signed: Arlee Muslim, PA-C Orthopaedic Surgery 03/08/2017, 7:59 AM

## 2017-03-08 NOTE — Progress Notes (Signed)
   Subjective: 1 Day Post-Op Procedure(s) (LRB): RIGHT TOTAL HIP ARTHROPLASTY ANTERIOR APPROACH (Right) Patient reports pain as mild.   Patient seen in rounds by Dr. Wynelle Link. Patient is well, but has had some minor complaints of pain in the hip, requiring pain medications We will start therapy today.  If they do well with therapy and meets all goals, then will allow home later this afternoon following therapy. Plan is to go Home after hospital stay.  Objective: Vital signs in last 24 hours: Temp:  [97.1 F (36.2 C)-98.7 F (37.1 C)] 98.1 F (36.7 C) (03/29 0550) Pulse Rate:  [71-97] 75 (03/29 0550) Resp:  [12-18] 16 (03/29 0550) BP: (126-177)/(58-125) 126/71 (03/29 0550) SpO2:  [95 %-98 %] 95 % (03/29 0550) Weight:  [104.8 kg (231 lb)] 104.8 kg (231 lb) (03/28 0828)  Intake/Output from previous day:  Intake/Output Summary (Last 24 hours) at 03/08/17 0748 Last data filed at 03/08/17 0600  Gross per 24 hour  Intake             4865 ml  Output             2960 ml  Net             1905 ml    Intake/Output this shift: No intake/output data recorded.  Labs:  Recent Labs  03/08/17 0440  HGB 13.2    Recent Labs  03/08/17 0440  WBC 17.7*  RBC 4.03*  HCT 39.5  PLT 235    Recent Labs  03/08/17 0440  NA 136  K 4.8  CL 103  CO2 23  BUN 14  CREATININE 0.90  GLUCOSE 144*  CALCIUM 8.7*   No results for input(s): LABPT, INR in the last 72 hours.  EXAM General - Patient is Alert, Appropriate and Oriented Extremity - Neurovascular intact Sensation intact distally Intact pulses distally Dorsiflexion/Plantar flexion intact Dressing - dressing C/D/I Motor Function - intact, moving foot and toes well on exam.  Hemovac pulled without difficulty.  Past Medical History:  Diagnosis Date  . 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 Cerumen impaction 2016   bilateral with otitis externa s/p ENT removal - [facility]  . GERD (gastroesophageal reflux disease)    occasional  . Gout    ?due to L great toe pain, saw Dr Gladstone Lighter  . History of chicken pox   . Hyperlipidemia   . Hypertension   . Legg-Perthes disease    Right hip  . Nasal septal deviation    monitoring - [facility]  . Pre-diabetes    ? borderline  . Prediabetes 03/22/2009    Assessment/Plan: 1 Day Post-Op Procedure(s) (LRB): RIGHT TOTAL HIP ARTHROPLASTY ANTERIOR APPROACH (Right) Principal Problem:   OA (osteoarthritis) of hip  Estimated body mass index is 31.33 kg/m as calculated from the following:   Height as of this encounter: 6' (1.829 m).   Weight as of this encounter: 104.8 kg (231 lb). Advance diet Up with therapy Discharge home with home health  DVT Prophylaxis - Xarelto Weight Bearing As Tolerated right Leg Hemovac Pulled Begin Therapy  If meets goals and able to go home: Up with therapy Discharge home with home health Diet - Cardiac diet and Diabetic diet Follow up - in 2 weeks Activity - WBAT Disposition - Home Condition Upon Discharge - pending therapy D/C Meds - See DC Summary DVT Prophylaxis - Xarelto  Arlee Muslim, PA-C Orthopaedic Surgery 03/08/2017, 7:48 AM

## 2017-03-08 NOTE — Progress Notes (Signed)
qPhysical Therapy Treatment Patient Details Name: Devin Huffman MRN: [id number] DOB: [birth year] Today's Date: 03/08/2017    History of Present Illness Pt s/p R THR and with hx of L4-5 fusion (14)    PT Comments    Pt progressing with mobility but continues to experience pain of 6-7/10 with mobility.   Follow Up Recommendations  Home health PT     Equipment Recommendations  None recommended by PT    Recommendations for Other Services       Precautions / Restrictions Precautions Precautions: Fall Restrictions Weight Bearing Restrictions: No Other Position/Activity Restrictions: WBAT    Mobility  Bed Mobility Overal bed mobility: Needs Assistance Bed Mobility: Supine to Sit     Supine to sit: Min assist     General bed mobility comments: cues for sequence and use of L LE to self assist  Transfers Overall transfer level: Needs assistance Equipment used: Rolling walker (2 wheeled) Transfers: Sit to/from Stand Sit to Stand: Min guard         General transfer comment: cues for LE management and use of UEs to self assist  Ambulation/Gait Ambulation/Gait assistance: Min assist;Min guard Ambulation Distance (Feet): 120 Feet Assistive device: Rolling walker (2 wheeled) Gait Pattern/deviations: Step-to pattern;Decreased step length - right;Decreased step length - left;Shuffle;Trunk flexed Gait velocity: decr Gait velocity interpretation: Below normal speed for age/gender General Gait Details: cues for sequence, posture and position from [employer]            Wheelchair Mobility    Modified Rankin (Stroke Patients Only)       Balance                                            Cognition Arousal/Alertness: Awake/alert Behavior During Therapy: WFL for tasks assessed/performed Overall Cognitive Status: Within Functional Limits for tasks assessed                                        Exercises      General  Comments        Pertinent Vitals/Pain Pain Assessment: 0-10 Pain Score: 6  Pain Location: R hip with ambulation Pain Descriptors / Indicators: Aching;Sore Pain Intervention(s): Limited activity within patient's tolerance;Monitored during session;Premedicated before session;Ice applied    Home Living                      Prior Function            PT Goals (current goals can now be found in the care plan section) Acute Rehab PT Goals Patient Stated Goal: Regain IND and walk without pain PT Goal Formulation: With patient Time For Goal Achievement: 03/10/17 Potential to Achieve Goals: Good Progress towards PT goals: Progressing toward goals    Frequency    7X/week      PT Plan Current plan remains appropriate    Co-evaluation             End of Session Equipment Utilized During Treatment: Gait belt Activity Tolerance: Patient tolerated treatment well Patient left: in chair;with call bell/phone within reach;with family/visitor present Nurse Communication: Mobility status PT Visit Diagnosis: Difficulty in walking, not elsewhere classified (R26.2)     Time: 0109-3235 PT Time Calculation (min) (ACUTE ONLY): 30 min  Charges:  $Gait Training: 8-22 mins $Therapeutic Exercise: 8-22 mins                    G Codes:        Devin Huffman 03-16-17, 1:12 PM

## 2017-03-08 NOTE — Discharge Instructions (Addendum)
° °Dr. Frank Aluisio °Total Joint Specialist °[facility] °[address] °[place], [place] [address] °[phone] ° °ANTERIOR APPROACH TOTAL HIP REPLACEMENT POSTOPERATIVE DIRECTIONS ° ° °Hip Rehabilitation, Guidelines Following Surgery  °The results of a hip operation are greatly improved after range of motion and muscle strengthening exercises. Follow all safety measures which are given to protect your hip. If any of these exercises cause increased pain or swelling in your joint, decrease the amount until you are comfortable again. Then slowly increase the exercises. Call your caregiver if you have problems or questions.  ° °HOME CARE INSTRUCTIONS  °Remove items at home which could result in a fall. This includes throw rugs or furniture in walking pathways.  °· ICE to the affected hip every three hours for 30 minutes at a time and then as needed for pain and swelling.  Continue to use ice on the hip for pain and swelling from surgery. You may notice swelling that will progress down to the foot and ankle.  This is normal after surgery.  Elevate the leg when you are not up walking on it.   °· Continue to use the breathing machine which will help keep your temperature down.  It is common for your temperature to cycle up and down following surgery, especially at night when you are not up moving around and exerting yourself.  The breathing machine keeps your lungs expanded and your temperature down. ° ° °DIET °You may resume your previous home diet once your are discharged from the hospital. ° °DRESSING / WOUND CARE / SHOWERING °You may shower 3 days after surgery, but keep the wounds dry during showering.  You may use an occlusive plastic wrap (Press'n Seal for example), NO SOAKING/SUBMERGING IN THE BATHTUB.  If the bandage gets wet, change with a clean dry gauze.  If the incision gets wet, pat the wound dry with a clean towel. °You may start showering once you are discharged home but do not  submerge the incision under water. Just pat the incision dry and apply a dry gauze dressing on daily. °Change the surgical dressing daily and reapply a dry dressing each time. ° °ACTIVITY °Walk with your walker as instructed. °Use walker as long as suggested by your caregivers. °Avoid periods of inactivity such as sitting longer than an hour when not asleep. This helps prevent blood clots.  °You may resume a sexual relationship in one month or when given the OK by your doctor.  °You may return to work once you are cleared by your doctor.  °Do not drive a car for 6 weeks or until released by you surgeon.  °Do not drive while taking narcotics. ° °WEIGHT BEARING °Weight bearing as tolerated with assist device (walker, cane, etc) as directed, use it as long as suggested by your surgeon or therapist, typically at least 4-6 weeks. ° °POSTOPERATIVE CONSTIPATION PROTOCOL °Constipation - defined medically as fewer than three stools per week and severe constipation as less than one stool per week. ° °One of the most common issues patients have following surgery is constipation.  Even if you have a regular bowel pattern at home, your normal regimen is likely to be disrupted due to multiple reasons following surgery.  Combination of anesthesia, postoperative narcotics, change in appetite and fluid intake all can affect your bowels.  In order to avoid complications following surgery, here are some recommendations in order to help you during your recovery period. ° °Colace (docusate) - Pick up an over-the-counter   form of Colace or another stool softener and take twice a day as long as you are requiring postoperative pain medications.  Take with a full glass of water daily.  If you experience loose stools or diarrhea, hold the colace until you stool forms back up.  If your symptoms do not get better within 1 week or if they get worse, check with your doctor. ° °Dulcolax (bisacodyl) - Pick up over-the-counter and take as directed  by the product packaging as needed to assist with the movement of your bowels.  Take with a full glass of water.  Use this product as needed if not relieved by Colace only.  ° °MiraLax (polyethylene glycol) - Pick up over-the-counter to have on hand.  MiraLax is a solution that will increase the amount of water in your bowels to assist with bowel movements.  Take as directed and can mix with a glass of water, juice, soda, coffee, or tea.  Take if you go more than two days without a movement. °Do not use MiraLax more than once per day. Call your doctor if you are still constipated or irregular after using this medication for 7 days in a row. ° °If you continue to have problems with postoperative constipation, please contact the office for further assistance and recommendations.  If you experience "the worst abdominal pain ever" or develop nausea or vomiting, please contact the office immediatly for further recommendations for treatment. ° °ITCHING ° If you experience itching with your medications, try taking only a single pain pill, or even half a pain pill at a time.  You can also use Benadryl over the counter for itching or also to help with sleep.  ° °TED HOSE STOCKINGS °Wear the elastic stockings on both legs for three weeks following surgery during the day but you may remove then at night for sleeping. ° °MEDICATIONS °See your medication summary on the “After Visit Summary” that the nursing staff will review with you prior to discharge.  You may have some home medications which will be placed on hold until you complete the course of blood thinner medication.  It is important for you to complete the blood thinner medication as prescribed by your surgeon.  Continue your approved medications as instructed at time of discharge. ° °PRECAUTIONS °If you experience chest pain or shortness of breath - call 911 immediately for transfer to the hospital emergency department.  °If you develop a fever greater that 101 F,  purulent drainage from wound, increased redness or drainage from wound, foul odor from the wound/dressing, or calf pain - CONTACT YOUR SURGEON.   °                                                °FOLLOW-UP APPOINTMENTS °Make sure you keep all of your appointments after your operation with your surgeon and caregivers. You should call the office at the above phone number and make an appointment for approximately two weeks after the date of your surgery or on the date instructed by your surgeon outlined in the "After Visit Summary". ° °RANGE OF MOTION AND STRENGTHENING EXERCISES  °These exercises are designed to help you keep full movement of your hip joint. Follow your caregiver's or physical therapist's instructions. Perform all exercises about fifteen times, three times per day or as directed. Exercise both hips, even if you   have had only one joint replacement. These exercises can be done on a training (exercise) mat, on the floor, on a table or on a bed. Use whatever works the best and is most comfortable for you. Use music or television while you are exercising so that the exercises are a pleasant break in your day. This will make your life better with the exercises acting as a break in routine you can look forward to.  Lying on your back, slowly slide your foot toward your buttocks, raising your knee up off the floor. Then slowly slide your foot back down until your leg is straight again.  Lying on your back spread your legs as far apart as you can without causing discomfort.  Lying on your side, raise your upper leg and foot straight up from the floor as far as is comfortable. Slowly lower the leg and repeat.  Lying on your back, tighten up the muscle in the front of your thigh (quadriceps muscles). You can do this by keeping your leg straight and trying to raise your heel off the floor. This helps strengthen the largest muscle supporting your knee.  Lying on your back, tighten up the muscles of your  buttocks both with the legs straight and with the knee bent at a comfortable angle while keeping your heel on the floor.   IF YOU ARE TRANSFERRED TO A SKILLED REHAB FACILITY If the patient is transferred to a skilled rehab facility following release from the hospital, a list of the current medications will be sent to the facility for the patient to continue.  When discharged from the skilled rehab facility, please have the facility set up the patient's [place] prior to being released. Also, the skilled facility will be responsible for providing the patient with their medications at time of release from the facility to include their pain medication, the muscle relaxants, and their blood thinner medication. If the patient is still at the rehab facility at time of the two week follow up appointment, the skilled rehab facility will also need to assist the patient in arranging follow up appointment in our office and any transportation needs.  MAKE SURE YOU:  Understand these instructions.  Get help right away if you are not doing well or get worse.    Pick up stool softner and laxative for home use following surgery while on pain medications. Do not submerge incision under water. Please use good hand washing techniques while changing dressing each day. May shower starting three days after surgery. Please use a clean towel to pat the incision dry following showers. Continue to use ice for pain and swelling after surgery. Do not use any lotions or creams on the incision until instructed by your surgeon.  Take Xarelto for two and a half more weeks following discharge from the hospital, then discontinue Xarelto. Once the patient has completed the Xarelto, they may resume the 81 mg Aspirin.  Information on my medicine - XARELTO (Rivaroxaban)  This medication education was reviewed with me or my healthcare representative as part of my discharge preparation.  The pharmacist that  spoke with me during my hospital stay was:  Tommie Raymond, Student-PharmD  Why was Xarelto prescribed for you? Xarelto was prescribed for you to reduce the risk of blood clots forming after orthopedic surgery. The medical term for these abnormal blood clots is venous thromboembolism (VTE).  What do you need to know about xarelto ? Take your Xarelto ONCE DAILY at  the same time every day. You may take it either with or without food.  If you have difficulty swallowing the tablet whole, you may crush it and mix in applesauce just prior to taking your dose.  Take Xarelto exactly as prescribed by your doctor and DO NOT stop taking Xarelto without talking to the doctor who prescribed the medication.  Stopping without other VTE prevention medication to take the place of Xarelto may increase your risk of developing a clot.  After discharge, you should have regular check-up appointments with your healthcare provider that is prescribing your Xarelto.    What do you do if you miss a dose? If you miss a dose, take it as soon as you remember on the same day then continue your regularly scheduled once daily regimen the next day. Do not take two doses of Xarelto on the same day.   Important Safety Information A possible side effect of Xarelto is bleeding. You should call your healthcare provider right away if you experience any of the following: ? Bleeding from an injury or your nose that does not stop. ? Unusual colored urine (red or dark brown) or unusual colored stools (red or black). ? Unusual bruising for unknown reasons. ? A serious fall or if you hit your head (even if there is no bleeding).  Some medicines may interact with Xarelto and might increase your risk of bleeding while on Xarelto. To help avoid this, consult your healthcare provider or pharmacist prior to using any new prescription or non-prescription medications, including herbals, vitamins, non-steroidal anti-inflammatory drugs  (NSAIDs) and supplements.  This website has more information on Xarelto: [email or web address]

## 2017-03-08 NOTE — Care Management Note (Signed)
Case Management Note  Patient Details  Name: Devin Huffman MRN: [id number] Date of Birth: 07-01-1946  Subjective/Objective: 71 y.o. 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 Has all DME. [facility] does not accept insurance and pt has chosen [facility]. Await approval from [facility] rep.                     Action/Plan: Anticipate discharge home today/Fri. No further CM needs but will be available should additional discharge needs arise.   Expected Discharge Date:  03/08/17               Expected Discharge Plan:  [place]  In-House Referral:  NA  Discharge planning Services  CM Consult  Post Acute Care Choice:    Choice offered to:     DME Arranged:   (Has RW and 3n1) DME Agency:  NA  HH Arranged:  PT HH Agency:  [place]  Status of Service:  In process, will continue to follow  If discussed at Long Length of Stay Meetings, dates discussed:    Additional Comments:  Delrae Sawyers, RN 03/08/2017, 9:20 AM

## 2017-03-09 ENCOUNTER — Other Ambulatory Visit (HOSPITAL_COMMUNITY): Payer: Self-pay

## 2017-03-09 DIAGNOSIS — Z471 Aftercare following joint replacement surgery: Secondary | ICD-10-CM | POA: Diagnosis not present

## 2017-03-09 DIAGNOSIS — Z7901 Long term (current) use of anticoagulants: Secondary | ICD-10-CM | POA: Diagnosis not present

## 2017-03-09 DIAGNOSIS — K219 Gastro-esophageal reflux disease without esophagitis: Secondary | ICD-10-CM | POA: Diagnosis not present

## 2017-03-09 DIAGNOSIS — Z96641 Presence of right artificial hip joint: Secondary | ICD-10-CM | POA: Diagnosis not present

## 2017-03-09 DIAGNOSIS — M109 Gout, unspecified: Secondary | ICD-10-CM | POA: Diagnosis not present

## 2017-03-09 DIAGNOSIS — I10 Essential (primary) hypertension: Secondary | ICD-10-CM | POA: Diagnosis not present

## 2017-03-09 NOTE — Anesthesia Postprocedure Evaluation (Signed)
Anesthesia Post Note  Patient: Devin Huffman  Procedure(s) Performed: Procedure(s) (LRB): RIGHT TOTAL HIP ARTHROPLASTY ANTERIOR APPROACH (Right)  Patient location during evaluation: PACU Anesthesia Type: General Level of consciousness: awake and alert Pain management: pain level controlled Vital Signs Assessment: post-procedure vital signs reviewed and stable Respiratory status: spontaneous breathing, nonlabored ventilation, respiratory function stable and patient connected to nasal cannula oxygen Cardiovascular status: blood pressure returned to baseline and stable Postop Assessment: no signs of nausea or vomiting Anesthetic complications: no       Last Vitals:  Vitals:   03/08/17 0550 03/08/17 0849  BP: 126/71 129/62  Pulse: 75 78  Resp: 16 16  Temp: 36.7 C 36.8 C    Last Pain:  Vitals:   03/08/17 1810  TempSrc:   PainSc: 3                  Lynda Rainwater

## 2017-03-09 NOTE — Addendum Note (Signed)
Addendum  created 03/09/17 0753 by Lynda Rainwater, MD   Sign clinical note

## 2017-03-12 DIAGNOSIS — Z471 Aftercare following joint replacement surgery: Secondary | ICD-10-CM | POA: Diagnosis not present

## 2017-03-12 DIAGNOSIS — Z96641 Presence of right artificial hip joint: Secondary | ICD-10-CM | POA: Diagnosis not present

## 2017-03-12 DIAGNOSIS — I10 Essential (primary) hypertension: Secondary | ICD-10-CM | POA: Diagnosis not present

## 2017-03-12 DIAGNOSIS — Z7901 Long term (current) use of anticoagulants: Secondary | ICD-10-CM | POA: Diagnosis not present

## 2017-03-12 DIAGNOSIS — K219 Gastro-esophageal reflux disease without esophagitis: Secondary | ICD-10-CM | POA: Diagnosis not present

## 2017-03-12 DIAGNOSIS — M109 Gout, unspecified: Secondary | ICD-10-CM | POA: Diagnosis not present

## 2017-03-14 DIAGNOSIS — K219 Gastro-esophageal reflux disease without esophagitis: Secondary | ICD-10-CM | POA: Diagnosis not present

## 2017-03-14 DIAGNOSIS — Z7901 Long term (current) use of anticoagulants: Secondary | ICD-10-CM | POA: Diagnosis not present

## 2017-03-14 DIAGNOSIS — M109 Gout, unspecified: Secondary | ICD-10-CM | POA: Diagnosis not present

## 2017-03-14 DIAGNOSIS — Z471 Aftercare following joint replacement surgery: Secondary | ICD-10-CM | POA: Diagnosis not present

## 2017-03-14 DIAGNOSIS — I10 Essential (primary) hypertension: Secondary | ICD-10-CM | POA: Diagnosis not present

## 2017-03-14 DIAGNOSIS — Z96641 Presence of right artificial hip joint: Secondary | ICD-10-CM | POA: Diagnosis not present

## 2017-03-16 DIAGNOSIS — I10 Essential (primary) hypertension: Secondary | ICD-10-CM | POA: Diagnosis not present

## 2017-03-16 DIAGNOSIS — M109 Gout, unspecified: Secondary | ICD-10-CM | POA: Diagnosis not present

## 2017-03-16 DIAGNOSIS — Z7901 Long term (current) use of anticoagulants: Secondary | ICD-10-CM | POA: Diagnosis not present

## 2017-03-16 DIAGNOSIS — K219 Gastro-esophageal reflux disease without esophagitis: Secondary | ICD-10-CM | POA: Diagnosis not present

## 2017-03-16 DIAGNOSIS — Z471 Aftercare following joint replacement surgery: Secondary | ICD-10-CM | POA: Diagnosis not present

## 2017-03-16 DIAGNOSIS — Z96641 Presence of right artificial hip joint: Secondary | ICD-10-CM | POA: Diagnosis not present

## 2017-03-19 DIAGNOSIS — I10 Essential (primary) hypertension: Secondary | ICD-10-CM | POA: Diagnosis not present

## 2017-03-19 DIAGNOSIS — Z7901 Long term (current) use of anticoagulants: Secondary | ICD-10-CM | POA: Diagnosis not present

## 2017-03-19 DIAGNOSIS — Z471 Aftercare following joint replacement surgery: Secondary | ICD-10-CM | POA: Diagnosis not present

## 2017-03-19 DIAGNOSIS — Z96641 Presence of right artificial hip joint: Secondary | ICD-10-CM | POA: Diagnosis not present

## 2017-03-19 DIAGNOSIS — K219 Gastro-esophageal reflux disease without esophagitis: Secondary | ICD-10-CM | POA: Diagnosis not present

## 2017-03-19 DIAGNOSIS — M109 Gout, unspecified: Secondary | ICD-10-CM | POA: Diagnosis not present

## 2017-03-23 DIAGNOSIS — Z96641 Presence of right artificial hip joint: Secondary | ICD-10-CM | POA: Diagnosis not present

## 2017-03-23 DIAGNOSIS — M109 Gout, unspecified: Secondary | ICD-10-CM | POA: Diagnosis not present

## 2017-03-23 DIAGNOSIS — K219 Gastro-esophageal reflux disease without esophagitis: Secondary | ICD-10-CM | POA: Diagnosis not present

## 2017-03-23 DIAGNOSIS — Z471 Aftercare following joint replacement surgery: Secondary | ICD-10-CM | POA: Diagnosis not present

## 2017-03-23 DIAGNOSIS — Z7901 Long term (current) use of anticoagulants: Secondary | ICD-10-CM | POA: Diagnosis not present

## 2017-03-23 DIAGNOSIS — I10 Essential (primary) hypertension: Secondary | ICD-10-CM | POA: Diagnosis not present

## 2017-03-24 ENCOUNTER — Ambulatory Visit (INDEPENDENT_AMBULATORY_CARE_PROVIDER_SITE_OTHER): Payer: Medicare HMO | Admitting: Family Medicine

## 2017-03-24 ENCOUNTER — Encounter (INDEPENDENT_AMBULATORY_CARE_PROVIDER_SITE_OTHER): Payer: Self-pay

## 2017-03-24 ENCOUNTER — Encounter: Payer: Self-pay | Admitting: Family Medicine

## 2017-03-24 VITALS — BP 132/68 | HR 103 | Temp 98.3°F | Wt 232.1 lb

## 2017-03-24 DIAGNOSIS — R300 Dysuria: Secondary | ICD-10-CM

## 2017-03-24 LAB — POCT URINALYSIS DIPSTICK
Bilirubin, UA: NEGATIVE
GLUCOSE UA: NEGATIVE
Ketones, UA: NEGATIVE
NITRITE UA: POSITIVE
Spec Grav, UA: 1.025 (ref 1.010–1.025)
UROBILINOGEN UA: 0.2 U/dL
pH, UA: 6 (ref 5.0–8.0)

## 2017-03-24 MED ORDER — CIPROFLOXACIN HCL 500 MG PO TABS: 500.0000 mg | ORAL_TABLET | Freq: Two times a day (BID) | ORAL | 0 refills | Status: DC

## 2017-03-24 NOTE — Progress Notes (Signed)
   Subjective:    Patient ID: Devin Huffman, male    DOB: February 26, 1946, 71 y.o.   MRN: [id number]  HPI UTI- pt has hip replacement 3/28 and had a foley at that time.  Pt reports that last night he had the sensation 'all night that I needed to pee'.  Had temp to 101.5 last night.  Improved w/ Tylenol.  Pt reports he was only able to void small amounts and 'it burns'.  Continues to have burning and frequency.   Review of Systems For ROS see HPI     Objective:   Physical Exam  Constitutional: He is oriented to person, place, and time. He appears well-developed and well-nourished. No distress.  Abdominal: Soft. Bowel sounds are normal. He exhibits no distension. There is tenderness (mild suprapubic tenderness). There is no rebound and no guarding.  Neurological: He is alert and oriented to person, place, and time.  Skin: Skin is warm and dry.  Psychiatric: He has a normal mood and affect. His behavior is normal. Thought content normal.  Vitals reviewed.         Assessment & Plan:  Dysuria- new.  Pt's sxs started yesterday and are consistent w/ UTI.  UA is highly suspicious for infxn.  Given that he had a foley 2 weeks ago, will start Cipro (most recent Cr WNL).  Reviewed supportive care and red flags that should prompt return.  Pt expressed understanding and is in agreement w/ plan.

## 2017-03-24 NOTE — Progress Notes (Signed)
Pre visit review using our clinic review tool, if applicable. No additional management support is needed unless otherwise documented below in the visit note. 

## 2017-03-24 NOTE — Patient Instructions (Signed)
Follow up as needed/scheduled Start the Cipro twice daily- take w/ food Drink plenty of fluids If no improvement- or worsening- please let us know! Call with any questions or concerns Hang in there!!!

## 2017-03-26 ENCOUNTER — Telehealth: Payer: Self-pay | Admitting: Family Medicine

## 2017-03-26 ENCOUNTER — Encounter: Payer: Self-pay | Admitting: Family Medicine

## 2017-03-26 ENCOUNTER — Ambulatory Visit (INDEPENDENT_AMBULATORY_CARE_PROVIDER_SITE_OTHER): Payer: Medicare HMO | Admitting: Family Medicine

## 2017-03-26 ENCOUNTER — Encounter (HOSPITAL_COMMUNITY): Payer: Self-pay | Admitting: Emergency Medicine

## 2017-03-26 ENCOUNTER — Telehealth: Payer: Self-pay

## 2017-03-26 ENCOUNTER — Inpatient Hospital Stay (HOSPITAL_COMMUNITY)
Admission: EM | Admit: 2017-03-26 | Discharge: 2017-03-28 | DRG: 872 | Disposition: A | Discharge status: Home Health | Payer: Medicare HMO | Attending: Family Medicine | Admitting: Family Medicine

## 2017-03-26 ENCOUNTER — Emergency Department (HOSPITAL_COMMUNITY): Payer: Medicare HMO

## 2017-03-26 DIAGNOSIS — R7303 Prediabetes: Secondary | ICD-10-CM | POA: Diagnosis present

## 2017-03-26 DIAGNOSIS — N390 Urinary tract infection, site not specified: Secondary | ICD-10-CM | POA: Diagnosis not present

## 2017-03-26 DIAGNOSIS — N342 Other urethritis: Secondary | ICD-10-CM | POA: Diagnosis not present

## 2017-03-26 DIAGNOSIS — R0902 Hypoxemia: Secondary | ICD-10-CM | POA: Diagnosis not present

## 2017-03-26 DIAGNOSIS — E785 Hyperlipidemia, unspecified: Secondary | ICD-10-CM

## 2017-03-26 DIAGNOSIS — Z981 Arthrodesis status: Secondary | ICD-10-CM

## 2017-03-26 DIAGNOSIS — K219 Gastro-esophageal reflux disease without esophagitis: Secondary | ICD-10-CM | POA: Diagnosis present

## 2017-03-26 DIAGNOSIS — Z96641 Presence of right artificial hip joint: Secondary | ICD-10-CM | POA: Diagnosis present

## 2017-03-26 DIAGNOSIS — R42 Dizziness and giddiness: Secondary | ICD-10-CM | POA: Diagnosis not present

## 2017-03-26 DIAGNOSIS — M9111 Juvenile osteochondrosis of head of femur [Legg-Calve-Perthes], right leg: Secondary | ICD-10-CM | POA: Diagnosis present

## 2017-03-26 DIAGNOSIS — Z79899 Other long term (current) drug therapy: Secondary | ICD-10-CM

## 2017-03-26 DIAGNOSIS — I10 Essential (primary) hypertension: Secondary | ICD-10-CM | POA: Diagnosis present

## 2017-03-26 DIAGNOSIS — E871 Hypo-osmolality and hyponatremia: Secondary | ICD-10-CM | POA: Diagnosis present

## 2017-03-26 DIAGNOSIS — R000 Tachycardia, unspecified: Secondary | ICD-10-CM

## 2017-03-26 DIAGNOSIS — A419 Sepsis, unspecified organism: Principal | ICD-10-CM | POA: Diagnosis present

## 2017-03-26 DIAGNOSIS — Z7901 Long term (current) use of anticoagulants: Secondary | ICD-10-CM

## 2017-03-26 DIAGNOSIS — Z888 Allergy status to other drugs, medicaments and biological substances status: Secondary | ICD-10-CM

## 2017-03-26 DIAGNOSIS — R404 Transient alteration of awareness: Secondary | ICD-10-CM | POA: Diagnosis not present

## 2017-03-26 DIAGNOSIS — Z7982 Long term (current) use of aspirin: Secondary | ICD-10-CM

## 2017-03-26 DIAGNOSIS — E1169 Type 2 diabetes mellitus with other specified complication: Secondary | ICD-10-CM | POA: Diagnosis present

## 2017-03-26 DIAGNOSIS — R531 Weakness: Secondary | ICD-10-CM | POA: Diagnosis not present

## 2017-03-26 LAB — URINALYSIS, ROUTINE W REFLEX MICROSCOPIC
Bilirubin Urine: NEGATIVE
Glucose, UA: NEGATIVE mg/dL
Ketones, ur: NEGATIVE mg/dL
NITRITE: NEGATIVE
PH: 6 (ref 5.0–8.0)
Protein, ur: 30 mg/dL — AB
Specific Gravity, Urine: 1.025 (ref 1.005–1.030)

## 2017-03-26 LAB — I-STAT CHEM 8, ED
BUN: 19 mg/dL (ref 6–20)
Calcium, Ion: 0.99 mmol/L — ABNORMAL LOW (ref 1.15–1.40)
Chloride: 101 mmol/L (ref 101–111)
Creatinine, Ser: 0.9 mg/dL (ref 0.61–1.24)
Glucose, Bld: 208 mg/dL — ABNORMAL HIGH (ref 65–99)
HEMATOCRIT: 38 % — AB (ref 39.0–52.0)
HEMOGLOBIN: 12.9 g/dL — AB (ref 13.0–17.0)
Potassium: 4.7 mmol/L (ref 3.5–5.1)
Sodium: 135 mmol/L (ref 135–145)
TCO2: 25 mmol/L (ref 0–100)

## 2017-03-26 LAB — CBC WITH DIFFERENTIAL/PLATELET
BASOS ABS: 0 10*3/uL (ref 0.0–0.1)
BASOS PCT: 0 %
Eosinophils Absolute: 0 10*3/uL (ref 0.0–0.7)
Eosinophils Relative: 0 %
HEMATOCRIT: 38.4 % — AB (ref 39.0–52.0)
HEMOGLOBIN: 12.8 g/dL — AB (ref 13.0–17.0)
Lymphocytes Relative: 10 %
Lymphs Abs: 1.1 10*3/uL (ref 0.7–4.0)
MCH: 31.4 pg (ref 26.0–34.0)
MCHC: 33.3 g/dL (ref 30.0–36.0)
MCV: 94.3 fL (ref 78.0–100.0)
Monocytes Absolute: 0.8 10*3/uL (ref 0.1–1.0)
Monocytes Relative: 7 %
NEUTROS ABS: 9.3 10*3/uL — AB (ref 1.7–7.7)
NEUTROS PCT: 83 %
Platelets: 330 10*3/uL (ref 150–400)
RBC: 4.07 MIL/uL — ABNORMAL LOW (ref 4.22–5.81)
RDW: 13.1 % (ref 11.5–15.5)
WBC: 11.2 10*3/uL — ABNORMAL HIGH (ref 4.0–10.5)

## 2017-03-26 LAB — I-STAT TROPONIN, ED: TROPONIN I, POC: 0 ng/mL (ref 0.00–0.08)

## 2017-03-26 LAB — I-STAT CG4 LACTIC ACID, ED
LACTIC ACID, VENOUS: 3.13 mmol/L — AB (ref 0.5–1.9)
Lactic Acid, Venous: 1.52 mmol/L (ref 0.5–1.9)

## 2017-03-26 LAB — LACTIC ACID, PLASMA: LACTIC ACID, VENOUS: 1.5 mmol/L (ref 0.5–1.9)

## 2017-03-26 LAB — PROTIME-INR
INR: 1.52
PROTHROMBIN TIME: 18.4 s — AB (ref 11.4–15.2)

## 2017-03-26 LAB — APTT: APTT: 43 s — AB (ref 24–36)

## 2017-03-26 LAB — PROCALCITONIN: Procalcitonin: 0.7 ng/mL

## 2017-03-26 MED ORDER — HYDRALAZINE HCL 20 MG/ML IJ SOLN
5.0000 mg | INTRAMUSCULAR | Status: DC | PRN
  Administered 2017-03-26 – 2017-03-27 (×2): 5 mg via INTRAVENOUS
  Filled 2017-03-26 (×2): qty 1

## 2017-03-26 MED ORDER — ACETAMINOPHEN 500 MG PO TABS
1000.0000 mg | ORAL_TABLET | Freq: Four times a day (QID) | ORAL | Status: DC | PRN
  Administered 2017-03-26 – 2017-03-27 (×2): 1000 mg via ORAL
  Filled 2017-03-26 (×2): qty 2

## 2017-03-26 MED ORDER — SODIUM CHLORIDE 0.9 % IV BOLUS (SEPSIS)
1000.0000 mL | Freq: Once | INTRAVENOUS | Status: AC
  Administered 2017-03-26: 1000 mL via INTRAVENOUS

## 2017-03-26 MED ORDER — METHOCARBAMOL 500 MG PO TABS: 500.0000 mg | ORAL_TABLET | Freq: Four times a day (QID) | ORAL | Status: DC | PRN

## 2017-03-26 MED ORDER — SODIUM CHLORIDE 0.9% FLUSH
3.0000 mL | Freq: Two times a day (BID) | INTRAVENOUS | Status: DC
  Administered 2017-03-26 – 2017-03-28 (×4): 3 mL via INTRAVENOUS

## 2017-03-26 MED ORDER — DEXTROSE 5 % IV SOLN
2.0000 g | Freq: Two times a day (BID) | INTRAVENOUS | Status: DC
  Administered 2017-03-27 – 2017-03-28 (×3): 2 g via INTRAVENOUS
  Filled 2017-03-26 (×4): qty 2

## 2017-03-26 MED ORDER — OXYCODONE HCL 5 MG PO TABS: 5.0000 mg | ORAL_TABLET | ORAL | Status: DC | PRN

## 2017-03-26 MED ORDER — AMLODIPINE BESYLATE 5 MG PO TABS
5.0000 mg | ORAL_TABLET | Freq: Every day | ORAL | Status: DC
  Administered 2017-03-26: 5 mg via ORAL
  Filled 2017-03-26: qty 1

## 2017-03-26 MED ORDER — ONDANSETRON HCL 4 MG PO TABS: 4.0000 mg | ORAL_TABLET | Freq: Four times a day (QID) | ORAL | Status: DC | PRN

## 2017-03-26 MED ORDER — RIVAROXABAN 10 MG PO TABS
10.0000 mg | ORAL_TABLET | Freq: Every day | ORAL | Status: DC
  Administered 2017-03-27 – 2017-03-28 (×2): 10 mg via ORAL
  Filled 2017-03-26 (×2): qty 1

## 2017-03-26 MED ORDER — ONDANSETRON HCL 4 MG/2ML IJ SOLN: 4.0000 mg | Freq: Four times a day (QID) | INTRAMUSCULAR | Status: DC | PRN

## 2017-03-26 MED ORDER — ACETAMINOPHEN 325 MG PO TABS
650.0000 mg | ORAL_TABLET | Freq: Once | ORAL | Status: AC
  Administered 2017-03-26: 650 mg via ORAL
  Filled 2017-03-26: qty 2

## 2017-03-26 MED ORDER — ONDANSETRON HCL 4 MG/2ML IJ SOLN
4.0000 mg | Freq: Once | INTRAMUSCULAR | Status: AC
  Administered 2017-03-26: 4 mg via INTRAVENOUS
  Filled 2017-03-26: qty 2

## 2017-03-26 MED ORDER — ACETAMINOPHEN 325 MG PO TABS
325.0000 mg | ORAL_TABLET | Freq: Once | ORAL | Status: AC
  Administered 2017-03-26: 325 mg via ORAL
  Filled 2017-03-26: qty 1

## 2017-03-26 MED ORDER — PANTOPRAZOLE SODIUM 40 MG PO TBEC
40.0000 mg | DELAYED_RELEASE_TABLET | Freq: Every day | ORAL | Status: DC
  Administered 2017-03-26 – 2017-03-28 (×3): 40 mg via ORAL
  Filled 2017-03-26 (×3): qty 1

## 2017-03-26 MED ORDER — SODIUM CHLORIDE 0.9 % IV BOLUS (SEPSIS)
500.0000 mL | Freq: Once | INTRAVENOUS | Status: AC
  Administered 2017-03-26: 500 mL via INTRAVENOUS

## 2017-03-26 MED ORDER — METOPROLOL SUCCINATE ER 25 MG PO TB24
25.0000 mg | ORAL_TABLET | Freq: Every day | ORAL | Status: DC
  Administered 2017-03-26 – 2017-03-27 (×2): 25 mg via ORAL
  Filled 2017-03-26 (×2): qty 1

## 2017-03-26 MED ORDER — INSULIN ASPART 100 UNIT/ML ~~LOC~~ SOLN
0.0000 [IU] | Freq: Three times a day (TID) | SUBCUTANEOUS | Status: DC
  Administered 2017-03-27 (×3): 3 [IU] via SUBCUTANEOUS
  Administered 2017-03-28: 1 [IU] via SUBCUTANEOUS
  Administered 2017-03-28: 2 [IU] via SUBCUTANEOUS

## 2017-03-26 MED ORDER — DEXTROSE 5 % IV SOLN
1.0000 g | Freq: Once | INTRAVENOUS | Status: AC
  Administered 2017-03-26: 1 g via INTRAVENOUS
  Filled 2017-03-26: qty 10

## 2017-03-26 MED ORDER — DEXTROSE 5 % IV SOLN
2.0000 g | Freq: Once | INTRAVENOUS | Status: AC
  Administered 2017-03-26: 2 g via INTRAVENOUS
  Filled 2017-03-26: qty 2

## 2017-03-26 MED ORDER — LACTATED RINGERS IV SOLN
INTRAVENOUS | Status: DC
  Administered 2017-03-26 – 2017-03-27 (×4): via INTRAVENOUS

## 2017-03-26 NOTE — ED Notes (Signed)
Pt given a meal tray 

## 2017-03-26 NOTE — Progress Notes (Signed)
Pre visit review using our clinic review tool, if applicable. No additional management support is needed unless otherwise documented below in the visit note. 

## 2017-03-26 NOTE — Telephone Encounter (Signed)
Patient Name: Devin Huffman  DOB: 08-12-1946    Initial Comment Caller states her husband has been having a fever at night. He was seen on Saturday. DX- Bladder infection, and dehydration. Still having fever. 100.7. Still having frequent urination and burning.    Nurse Assessment  Nurse: Raphael Gibney, RN, Vanita Ingles Date/Time (Eastern Time): 03/26/2017 9:12:01 AM  Confirm and document reason for call. If symptomatic, describe symptoms. ---Caller states spouse was seen on Saturday and was diagnosed with bladder infection and dehydration. He has been taking antibiotics. Temp 101.7 last night. Temp after tylenol 100.7. still having burning and pain with urination. Had hip replacement 3 weeks ago.  Does the patient have any new or worsening symptoms? ---Yes  Will a triage be completed? ---Yes  Related visit to physician within the last 2 weeks? ---Yes  Does the PT have any chronic conditions? (i.e. diabetes, asthma, etc.) ---No  Is this a behavioral health or substance abuse call? ---No     Guidelines    Guideline Title Affirmed Question Affirmed Notes  Urinary Tract Infection on Antibiotic Follow-up Call - Male [1] Taking antibiotic > 24 hours for UTI AND [2] fever persists    Final Disposition User   See Physician within 24 Hours Raphael Gibney, RN, [place]    Comments  appt scheduled for 03/26/2017 at noon with Dr. Elsie Stain   Referrals  REFERRED TO PCP OFFICE   Disagree/Comply: Comply

## 2017-03-26 NOTE — ED Provider Notes (Signed)
[facility] DEPT Provider Note   CSN: [id number] Arrival date & time: 03/26/17  1338     History   Chief Complaint Chief Complaint  Patient presents with  . Weakness  . Nausea    HPI Devin Huffman is a 71 y.o. male history of reflux, hypertension, recent right hip replacement on Xarelto here presenting with fever, possible urinary tract infection, low blood pressure. Patient states that he had right hip replacement done about 2-1/2 weeks ago and that was unremarkable. Patient has been on Xarelto for the last several weeks. Patient was running fevers for the last 3 days and went to urgent care and had UA that is positive for leuks and was started on Cipro. Patient has been taking Cipro as prescribed for the last 3 days but still running fever about 101 every night. He has been taking Tylenol round-the-clock but whenever the Tylenol wears off his fever comes back. Patient went to see his primary care doctor today and was noted to be febrile and tachycardic and blood pressure in the 110s. Patient was considered to be septic and sent by EMS. EMS noted O2 92% on RA and put on oxygen. Patient denies chest pain or shortness of breath. Felt nauseated and light headed but denies vomiting or abdominal pain. States that he saw Dr. Maureen Ralphs after surgery for postop check recently and the wound is healing well and there is no drainage from it.   The history is provided by the patient.    Past Medical History:  Diagnosis Date  . 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 Cerumen impaction 2016   bilateral with otitis externa s/p ENT removal - [facility]  . GERD (gastroesophageal reflux disease)    occasional  . Gout    ?due to L great toe pain, saw Dr Gladstone Lighter  . History of chicken pox   . Hyperlipidemia   . Hypertension   . Legg-Perthes disease    Right hip  . Nasal septal deviation    monitoring - [facility]  . Pre-diabetes    ? borderline  . Prediabetes 03/22/2009    Patient  Active Problem List   Diagnosis Date Noted  . Tachycardia 03/26/2017  . OA (osteoarthritis) of hip 03/07/2017  . Pre-op exam 02/22/2017  . 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 Peripheral neuropathy 08/31/2016  . Heat intolerance 08/31/2016  . Obesity, Class I, BMI 30-34.9 05/14/2015  . Advanced care planning/counseling discussion 11/11/2014  . Vitamin B12 deficiency 10/15/2012  . Medicare annual wellness visit, subsequent 10/02/2011  . GERD (gastroesophageal reflux disease) 10/02/2011  . Dyslipidemia 07/15/2009  . Gout 07/15/2009  . Prediabetes 03/22/2009  . Essential hypertension 06/23/2008    Past Surgical History:  Procedure Laterality Date  . COLONOSCOPY  03/2011   2 tubular adenomas, rec rpt 5 yrs  . HERNIA REPAIR     double hernia  . 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 TONSILLECTOMY    . TOTAL HIP ARTHROPLASTY Right 03/07/2017   Procedure: RIGHT TOTAL HIP ARTHROPLASTY ANTERIOR APPROACH;  Surgeon: Gaynelle Arabian, MD;  Location: [facility];  Service: Orthopedics;  Laterality: Right;  Marland Kitchen VASECTOMY    . WISDOM TOOTH EXTRACTION         Home Medications    Prior to Admission medications   Medication Sig Start Date End Date Taking? Authorizing Provider  amLODipine (  NORVASC) 5 MG tablet Take 1 tablet (5 mg total) by mouth daily. Patient taking differently: Take 5 mg by mouth at bedtime.  02/22/17  Yes Ria Bush, MD  ciprofloxacin (CIPRO) 500 MG tablet Take 1 tablet (500 mg total) by mouth 2 (two) times daily. 03/24/17  Yes Midge Minium, MD  methocarbamol (ROBAXIN) 500 MG tablet Take 1 tablet (500 mg total) by mouth every 6 (six) hours as needed for muscle spasms. 03/08/17  Yes Alexzandrew L Perkins, PA-C  metoprolol succinate (TOPROL-XL) 25 MG 24 hr tablet Take 1 tablet (25 mg total) by mouth daily. Patient taking differently: Take 25  mg by mouth at bedtime.  02/22/17  Yes Ria Bush, MD  oxyCODONE (OXY IR/ROXICODONE) 5 MG immediate release tablet Take 1-2 tablets (5-10 mg total) by mouth every 4 (four) hours as needed for moderate pain or severe pain. 03/08/17  Yes Alexzandrew L Perkins, PA-C  rivaroxaban (XARELTO) 10 MG TABS tablet Take 1 tablet (10 mg total) by mouth daily with breakfast. Take Xarelto for two and a half more weeks following discharge from the hospital, then discontinue Xarelto. Once the patient has completed the Xarelto, they may resume the 81 mg Aspirin. 03/08/17  Yes Alexzandrew L Perkins, PA-C  atorvastatin (LIPITOR) 20 MG tablet Take 1 tablet (20 mg total) by mouth daily. Patient not taking: Reported on 03/26/2017 08/31/16   Ria Bush, MD  omeprazole (PRILOSEC) 20 MG capsule TAKE ONE CAPSULE BY MOUTH ONCE DAILY AS NEEDED Patient taking differently: TAKE ONE CAPSULE BY MOUTH ONCE AT BEDTIME AS NEEDED FOR ACID REFLUX/HEARTBURN 07/11/16   Ria Bush, MD    Family History Family History  Problem Relation Age of Onset  . Breast cancer Mother     with recurrence  . Alzheimer's disease Father   . Healthy Sister   . Cancer Maternal Aunt     ?  Marland Kitchen Hypertension Maternal Uncle   . Stroke Maternal Uncle   . Coronary artery disease Paternal Uncle     MI  . Cancer Other     niece-2 types  . Stroke Maternal Grandfather     Social History Social History  Substance Use Topics  . Smoking status: Never Smoker  . Smokeless tobacco: Never Used  . Alcohol use No     Allergies   Lisinopril   Review of Systems Review of Systems  Constitutional: Positive for fever.  Gastrointestinal: Positive for nausea.  Neurological: Positive for weakness.  All other systems reviewed and are negative.    Physical Exam Updated Vital Signs BP (!) 126/59   Pulse (!) 119   Temp 100.2 F (37.9 C) (Oral)   Resp 15   Ht 6' (1.829 m)   Wt 232 lb (105.2 kg)   SpO2 94%   BMI 31.46 kg/m   Physical  Exam  Constitutional: He is oriented to person, place, and time.  Dehydrated, uncomfortable   HENT:  Head: Normocephalic.  MM slightly dry   Eyes: EOM are normal. Pupils are equal, round, and reactive to light.  Neck: Normal range of motion. Neck supple.  Cardiovascular: Regular rhythm and normal heart sounds.   Tachycardic   Pulmonary/Chest: Effort normal and breath sounds normal. No respiratory distress. He has no wheezes. He has no rales.  Abdominal: Soft. Bowel sounds are normal. He exhibits no distension. There is no tenderness. There is no guarding.  No CVAT   Musculoskeletal: Normal range of motion.  R hip incision site healing well, no purulent discharge. No  leg swelling or calf tenderness   Neurological: He is alert and oriented to person, place, and time.  Skin: Skin is warm and dry.  Psychiatric: He has a normal mood and affect.  Nursing note and vitals reviewed.    ED Treatments / Results  Labs (all labs ordered are listed, but only abnormal results are displayed) Labs Reviewed  URINALYSIS, ROUTINE W REFLEX MICROSCOPIC - Abnormal; Notable for the following:       Result Value   APPearance HAZY (*)    Hgb urine dipstick MODERATE (*)    Protein, ur 30 (*)    Leukocytes, UA MODERATE (*)    Bacteria, UA FEW (*)    Squamous Epithelial / LPF 0-5 (*)    All other components within normal limits  CBC WITH DIFFERENTIAL/PLATELET - Abnormal; Notable for the following:    WBC 11.2 (*)    RBC 4.07 (*)    Hemoglobin 12.8 (*)    HCT 38.4 (*)    Neutro Abs 9.3 (*)    All other components within normal limits  I-STAT CHEM 8, ED - Abnormal; Notable for the following:    Glucose, Bld 208 (*)    Calcium, Ion 0.99 (*)    Hemoglobin 12.9 (*)    HCT 38.0 (*)    All other components within normal limits  I-STAT CG4 LACTIC ACID, ED - Abnormal; Notable for the following:    Lactic Acid, Venous 3.13 (*)    All other components within normal limits  URINE CULTURE  CULTURE, BLOOD  (ROUTINE X 2)  CULTURE, BLOOD (ROUTINE X 2)  COMPREHENSIVE METABOLIC PANEL  I-STAT CG4 LACTIC ACID, ED  Randolm Idol, ED    EKG  EKG Interpretation None       Radiology Dg Chest Port 1 View  Result Date: 03/26/2017 CLINICAL DATA:  71 y/o  M; fever and hypoxia. EXAM: PORTABLE CHEST 1 VIEW COMPARISON:  02/22/2017 chest radiograph FINDINGS: Stable heart size and mediastinal contours are within normal limits given projection and technique. Both lungs are clear. Mild S-shaped curvature of the spine. IMPRESSION: No active disease. Electronically Signed   By: Kristine Garbe M.D.   On: 03/26/2017 14:55    Procedures Procedures (including critical care time)  Medications Ordered in ED Medications  sodium chloride 0.9 % bolus 1,000 mL (not administered)  sodium chloride 0.9 % bolus 1,000 mL (0 mLs Intravenous Stopped 03/26/17 1654)  acetaminophen (TYLENOL) tablet 650 mg (650 mg Oral Given 03/26/17 1433)  ondansetron (ZOFRAN) injection 4 mg (4 mg Intravenous Given 03/26/17 1437)  cefTRIAXone (ROCEPHIN) 1 g in dextrose 5 % 50 mL IVPB (0 g Intravenous Stopped 03/26/17 1654)     Initial Impression / Assessment and Plan / ED Course  I have reviewed the triage vital signs and the nursing notes.  Pertinent labs & imaging results that were available during my care of the patient were reviewed by me and considered in my medical decision making (see chart for details).     HYMAN CROSSAN is a 71 y.o. male here with fever, tachycardia. Persistent fever despite being on cipro for UTI. Concerned for failure of outpatient abx for UTI. Will do sepsis workup with labs, lactate, cultures, repeat, CXR. I think tachycardia likely from sepsis. He is on xarelto and has no chest pain or shortness of breath and I doubt PE. Will give IVF and give rocephin empirically.   5:27 PM WBC 11. HR still around 120. Sinus tachycardia. Lactate 3. Given 2  L NS bolus. UA + leuk and too many to count WBC. Given  rocephin. Meets SIRS criteria. Will admit for IV abx.    Final Clinical Impressions(s) / ED Diagnoses   Final diagnoses:  None    New Prescriptions New Prescriptions   No medications on file     Drenda Freeze, MD 03/26/17 1727

## 2017-03-26 NOTE — ED Notes (Signed)
Bed: WA07 Expected date:  Expected time:  Means of arrival:  Comments: EMS 

## 2017-03-26 NOTE — Telephone Encounter (Signed)
Pt has appt 03/26/17 at 12 noon with Dr Damita Dunnings.

## 2017-03-26 NOTE — Progress Notes (Signed)
Pharmacy Antibiotic Note  Devin Huffman is a 71 y.o. male admitted on 03/26/2017 with sepsis secondary to UTI (failed outpatient Cipro). Pharmacy has been consulted for Cefepime dosing.  Plan: Cefepime 2g IV q12h. Monitor renal function, cultures, clinical course.   Height: 6' (182.9 cm) Weight: 235 lb 0.2 oz (106.6 kg) IBW/kg (Calculated) : 77.6  Temp (24hrs), Avg:101.3 F (38.5 C), Min:99.5 F (37.5 C), Max:102.5 F (39.2 C)   Recent Labs Lab 03/26/17 1458 03/26/17 1510 03/26/17 1707 03/26/17 1709  WBC 11.2*  --   --   --   CREATININE  --   --   --  0.90  LATICACIDVEN  --  1.52 3.13*  --     Estimated Creatinine Clearance: 93.6 mL/min (by C-G formula based on SCr of 0.9 mg/dL).    Allergies  Allergen Reactions  . Lisinopril Hives and Swelling    ?angioedema - lip swelling, hives    Antimicrobials this admission: PTA Cipro 4/16 Ceftriaxone x 1 4/16 Cefepime >>  Dose adjustments this admission: --  Microbiology results: 4/16 BCx: sent 4/16 UCx: sent    Thank you for allowing pharmacy to be a part of this patient's care.   Lindell Spar, PharmD, BCPS Pager: [phone] 03/26/2017 9:22 PM

## 2017-03-26 NOTE — Telephone Encounter (Signed)
PLEASE NOTE: All timestamps contained within this report are represented as [place] Standard Time. CONFIDENTIALTY NOTICE: This fax transmission is intended only for the addressee. It contains information that is legally privileged, confidential or otherwise protected from use or disclosure. If you are not the intended recipient, you are strictly prohibited from reviewing, disclosing, copying using or disseminating any of this information or taking any action in reliance on or regarding this information. If you have received this fax in error, please notify us immediately by telephone so that we can arrange for its return to [place]. Phone: [phone], Toll-Free: [phone], Fax: [phone] Page: 1 of 1 Call Id: [id number] [place] Patient Name: Devin Huffman Gender: Male DOB: March 17, 1946 Age: 71 Y 56 M 4 D Return Phone Number: [phone] (Primary), [phone] (Secondary) City/State/Zip: [place] Client [place] Night - Client Client Site [place] Physician Ria Bush - MD Who Is Calling Patient / Member / Family / Caregiver Call Type Triage / Clinical Caller Name [name] Relationship To Patient Spouse Return Phone Number [phone] (Primary) Chief Complaint Fever (non urgent symptom) (> THREE MONTHS) Reason for Call Symptomatic / Request for Health Information Initial Comment Caller states, her husband has a low grade fever, provided with Tylenol - having burning when urinating and frequency. 99.9 temp this morning. Was at 101.5 last night. She is wondering if she could have. Verified Nurse Assessment Nurse: Verlin Fester RN, [facility] Date/Time (Eastern Time): 03/24/2017 9:30:18 AM Confirm and document reason for call. If symptomatic, describe symptoms. ---Patient states he has fever up to 101.5 (oral) and he has burning when he  urinates and frequency Does the PT have any chronic conditions? (i.e. diabetes, asthma, etc.) ---Yes List chronic conditions. ---"recent hip surgery" Guidelines Guideline Title Affirmed Question Urination Pain - Male Side (flank) or lower back pain present Disp. Time Eilene Ghazi Time) Disposition Final User 03/24/2017 9:35:18 AM See Physician within 4 Hours (or PCP triage) Yes Verlin Fester, RN, [facility] Referrals [facility] Care Advice Given Per Guideline SEE PHYSICIAN WITHIN 4 HOURS (or PCP triage): PAIN MEDICINES: * For pain relief, take acetaminophen, ibuprofen, or naproxen. * Use the lowest amount that makes your pain feel better. ACETAMINOPHEN (E.G., TYLENOL): IBUPROFEN (E.G., MOTRIN, ADVIL): * Before taking any medicine, read all the instructions on the package. CARE ADVICE given per Urination Pain - Male (Adult) guideline.

## 2017-03-26 NOTE — Progress Notes (Signed)
R hip surgery recently per Dr. Wynelle Link.  He was doing better from that standpoint.   He had foley with the operation.  He had urinary sx start about 3 days ago.  Generally didn't feel well.  [facility], started on cipro in the meantime.  Still with fevers.  Still with nausea.  Still burning with urination.  I don't see urine culture in EMR.    Inc pulse rate noted today.  His R hip feels good.     PMH and SH reviewed  ROS: Per HPI unless specifically indicated in ROS section   Meds, vitals, and allergies reviewed.   nad but looks like he doesn't feel well Mmm Neck supple Tachy but regular ctab abd soft, no rebound Ext w/o edema

## 2017-03-26 NOTE — ED Notes (Signed)
Pt aware of need for urine sample.  

## 2017-03-26 NOTE — ED Triage Notes (Signed)
Pt is post op for left hip replacement about 3 weeks ago.  He developed a kidney infection about a week and a half ago and has been taking oral antibiotic.  Pt began feeling lightheaded, nauseous and dizzy today.  Pt does not use O2 at home but did require it during transport to [facility] averaging 92% on RA.  CBG 164

## 2017-03-26 NOTE — ED Notes (Signed)
Lt green tube re drawn by RN

## 2017-03-26 NOTE — ED Notes (Signed)
2nd set cultures drawn

## 2017-03-26 NOTE — Telephone Encounter (Signed)
Per chart review tab pt seen at [facility] clinic.

## 2017-03-26 NOTE — Patient Instructions (Signed)
To ER

## 2017-03-26 NOTE — ED Notes (Signed)
Pt given sprite, crackers and peanut butter

## 2017-03-26 NOTE — H&P (Signed)
History and Physical    Devin Huffman ZOX:[id number] DOB: [birth year] DOA: 03/26/2017  PCP: Ria Bush, MD Consultants:  [facility] - orthopedics Patient coming from: home - lives with wife (home PT); NOK: wife, [phone], [phone]  Chief Complaint: nausea, weakness  HPI: Devin Huffman is a 70 y.o. male with medical history significant of HTN, HLD and recent hip replacement right leg 2 1/2 weeks ago (has been doing well) presenting with fever, nausea, weakness.  Thursday pm, he had a fever to 101.7.  Shaking/chills nightly.  Went to the walk-in clinic Saturday AM.  +dysuria, nausea.  Was given Cipro for UTI.  Fever did not resolve below 100.7.  Today is the 3rd day, not getting better.  Went back in today and his HR was 130, BP was 116/60 (very low for him).  He felt faint and weak.  They directed him to come to the hospital via ambulance.    ED Course: Fever, tachycardia, leukocytosis, and elevated lactate - treated for sepsis with 2L saline bolus and Rocephin.  Of note, he is also on Xarelto following his hip replacement (due to finish in 2 more days).  Review of Systems: As per HPI; otherwise review of systems reviewed and negative.   Ambulatory Status:  Ambulating some without assistance but mostly using walker after hip replacement  Past Medical History:  Diagnosis Date  . 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 Cerumen impaction 2016   bilateral with otitis externa s/p ENT removal - [facility]  . GERD (gastroesophageal reflux disease)    occasional  . Gout    ?due to L great toe pain, saw Dr Gladstone Lighter  . History of chicken pox   . Hyperlipidemia   . Hypertension   . Legg-Perthes disease    Right hip  . Nasal septal deviation    monitoring - [facility]  . Prediabetes 03/22/2009    Past Surgical History:  Procedure Laterality Date  . COLONOSCOPY  03/2011   2 tubular adenomas, rec rpt 5 yrs  . HERNIA REPAIR     double hernia  . 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 TONSILLECTOMY    . TOTAL HIP ARTHROPLASTY Right 03/07/2017   Procedure: RIGHT TOTAL HIP ARTHROPLASTY ANTERIOR APPROACH;  Surgeon: Gaynelle Arabian, MD;  Location: [facility];  Service: Orthopedics;  Laterality: Right;  Marland Kitchen VASECTOMY    . WISDOM TOOTH EXTRACTION      Social History   Social History  . Marital status: Married    Spouse name: N/A  . Number of children: N/A  . Years of education: N/A   Occupational History  . part-time    Social History Main Topics  . Smoking status: Never Smoker  . Smokeless tobacco: Never Used  . Alcohol use No  . Drug use: No  . Sexual activity: Yes   Other Topics Concern  . 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  No regular exercise.   Diet: "I love my ice cream. I can't live forever", some water    Allergies  Allergen Reactions  . Lisinopril Hives and Swelling    ?angioedema - lip swelling, hives    Family History  Problem Relation Age of Onset  . Breast cancer Mother     with recurrence  . Alzheimer's disease Father   . Healthy Sister   . Cancer Maternal Aunt     ?  Marland Kitchen Hypertension Maternal Uncle   . Stroke Maternal Uncle   . Coronary artery disease Paternal Uncle     MI  . Cancer Other     niece-2 types  . Stroke Maternal Grandfather     Prior to Admission medications   Medication Sig Start Date End Date Taking? Authorizing Provider  amLODipine (NORVASC) 5 MG tablet Take 1 tablet (5 mg total) by mouth daily. Patient taking differently: Take 5 mg by mouth at bedtime.  02/22/17  Yes Ria Bush, MD  ciprofloxacin (CIPRO) 500 MG tablet Take 1 tablet (500 mg total) by mouth 2 (two) times daily. 03/24/17  Yes  Midge Minium, MD  methocarbamol (ROBAXIN) 500 MG tablet Take 1 tablet (500 mg total) by mouth every 6 (six) hours as needed for muscle spasms. 03/08/17  Yes Alexzandrew L Perkins, PA-C  metoprolol succinate (TOPROL-XL) 25 MG 24 hr tablet Take 1 tablet (25 mg total) by mouth daily. Patient taking differently: Take 25 mg by mouth at bedtime.  02/22/17  Yes Ria Bush, MD  oxyCODONE (OXY IR/ROXICODONE) 5 MG immediate release tablet Take 1-2 tablets (5-10 mg total) by mouth every 4 (four) hours as needed for moderate pain or severe pain. 03/08/17  Yes Alexzandrew L Perkins, PA-C  rivaroxaban (XARELTO) 10 MG TABS tablet Take 1 tablet (10 mg total) by mouth daily with breakfast. Take Xarelto for two and a half more weeks following discharge from the hospital, then discontinue Xarelto. Once the patient has completed the Xarelto, they may resume the 81 mg Aspirin. 03/08/17  Yes Alexzandrew L Perkins, PA-C  atorvastatin (LIPITOR) 20 MG tablet Take 1 tablet (20 mg total) by mouth daily. Patient not taking: Reported on 03/26/2017 08/31/16   Ria Bush, MD  omeprazole (PRILOSEC) 20 MG capsule TAKE ONE CAPSULE BY MOUTH ONCE DAILY AS NEEDED Patient taking differently: TAKE ONE CAPSULE BY MOUTH ONCE AT BEDTIME AS NEEDED FOR ACID REFLUX/HEARTBURN 07/11/16   Ria Bush, MD    Physical Exam: Vitals:   03/26/17 2030 03/26/17 2034 03/26/17 2100 03/26/17 2101  BP: (!) 165/71 (!) 165/71  (!) 148/75  Pulse: (!) 121 (!) 123  (!) 110  Resp: (!) 21 18  (!) 22  Temp:  (!) 102.5 F (39.2 C)  (!) 102.5 F (39.2 C)  TempSrc:  Oral  Oral  SpO2: 93% 94%  95%  Weight:   106.6 kg (235 lb 0.2 oz)   Height:   6' (1.829 m)      General:  Appears febrile - warm, having chills, reports feeling miserable Eyes:  PERRL, EOMI, normal lids, iris ENT:  grossly normal hearing, lips & tongue, mmm Neck:  no LAD, masses or thyromegaly Cardiovascular:  RRR, no m/r/g. No LE edema.  Respiratory:  CTA bilaterally, no  w/r/r. Normal respiratory effort. Abdomen:  soft, ntnd, NABS Skin:  no rash or induration seen on limited exam Musculoskeletal:  grossly normal tone BUE/BLE, good ROM, no bony abnormality; R hip with well healing surgical incision and no surround erythema/warmth Psychiatric:  grossly normal mood and affect, speech fluent and appropriate, AOx3 Neurologic:  CN 2-12 grossly intact, moves all extremities in coordinated fashion, sensation intact  Labs on Admission: I have personally reviewed following labs and imaging studies  CBC:  Recent Labs Lab  03/26/17 1458 03/26/17 1709  WBC 11.2*  --   NEUTROABS 9.3*  --   HGB 12.8* 12.9*  HCT 38.4* 38.0*  MCV 94.3  --   PLT 330  --    Basic Metabolic Panel:  Recent Labs Lab 03/26/17 1709  NA 135  K 4.7  CL 101  GLUCOSE 208*  BUN 19  CREATININE 0.90   GFR: Estimated Creatinine Clearance: 93.6 mL/min (by C-G formula based on SCr of 0.9 mg/dL). Liver Function Tests: No results for input(s): AST, ALT, ALKPHOS, BILITOT, PROT, ALBUMIN in the last 168 hours. No results for input(s): LIPASE, AMYLASE in the last 168 hours. No results for input(s): AMMONIA in the last 168 hours. Coagulation Profile:  Recent Labs Lab 03/26/17 2131  INR 1.52   Cardiac Enzymes: No results for input(s): CKTOTAL, CKMB, CKMBINDEX, TROPONINI in the last 168 hours. BNP (last 3 results) No results for input(s): PROBNP in the last 8760 hours. HbA1C: No results for input(s): HGBA1C in the last 72 hours. CBG: No results for input(s): GLUCAP in the last 168 hours. Lipid Profile: No results for input(s): CHOL, HDL, LDLCALC, TRIG, CHOLHDL, LDLDIRECT in the last 72 hours. Thyroid Function Tests: No results for input(s): TSH, T4TOTAL, FREET4, T3FREE, THYROIDAB in the last 72 hours. Anemia Panel: No results for input(s): VITAMINB12, FOLATE, FERRITIN, TIBC, IRON, RETICCTPCT in the last 72 hours. Urine analysis:    Component Value Date/Time   COLORURINE YELLOW  03/26/2017 1458   APPEARANCEUR HAZY (A) 03/26/2017 1458   LABSPEC 1.025 03/26/2017 1458   PHURINE 6.0 03/26/2017 1458   GLUCOSEU NEGATIVE 03/26/2017 1458   HGBUR MODERATE (A) 03/26/2017 1458   BILIRUBINUR NEGATIVE 03/26/2017 1458   BILIRUBINUR neg 03/24/2017 1112   KETONESUR NEGATIVE 03/26/2017 1458   PROTEINUR 30 (A) 03/26/2017 1458   UROBILINOGEN 0.2 03/24/2017 1112   UROBILINOGEN 0.2 04/15/2013 0838   NITRITE NEGATIVE 03/26/2017 1458   LEUKOCYTESUR MODERATE (A) 03/26/2017 1458    Creatinine Clearance: Estimated Creatinine Clearance: 93.6 mL/min (by C-G formula based on SCr of 0.9 mg/dL).  Sepsis Labs: @LABRCNTIP (procalcitonin:4,lacticidven:4) )No results found for this or any previous visit (from the past 240 hour(s)).   Radiological Exams on Admission: Dg Chest Port 1 View  Result Date: 03/26/2017 CLINICAL DATA:  71 y/o  M; fever and hypoxia. EXAM: PORTABLE CHEST 1 VIEW COMPARISON:  02/22/2017 chest radiograph FINDINGS: Stable heart size and mediastinal contours are within normal limits given projection and technique. Both lungs are clear. Mild S-shaped curvature of the spine. IMPRESSION: No active disease. Electronically Signed   By: Kristine Garbe M.D.   On: 03/26/2017 14:55    EKG: pending  Assessment/Plan Principal Problem:   Sepsis ([place]) Active Problems:   Dyslipidemia   Essential hypertension   Prediabetes   History of right hip replacement   Sepsis -Elevated WBC count (11.2), fever (102.5), tachycardia (135) with elevated lactate to 3.13 -UA: few bacteria, moderate Hgb, moderate LE, + mucous, negative nitrite, TNTC WBC; UA on 4/14 - large LE, positive nitrite -While awaiting blood cultures, this appears to be a preseptic condition. -Sepsis protocol initiated; received 2L IVF bolus in the ER and started on abx  -Suspect urinary source; he took Cipro x 3 days but did not improve, and given his recent hospitalization with instrumentation and hip  replacement, will treat with the empiric algorithm for hospital-associated UTI (Cefepime) -Blood and urine cultures pending -Will admit with telemetry and continue to monitor; once sepsis physiology improves, he should no longer need telemetry -  Will trend lactate to ensure improvement -Will order non-ICU procalcitonin levels.  >0.5 indicates infection and >>0.5 indicates more serious disease.  Currently 0.70.  As the procalcitonin level normalizes, it will be reasonable to consider de-escalation of antibiotic coverage.  Prediabetes -Glucose 208 -A1c was 6.3 in 3/18 -He would likely benefit from oral hypoglycemic as an outpatient -Will change to carb modified diet and cover with SSI  h/o hip replacement -Per patient and wife, he was doing very well with his recovery until recent infection -He is on Xarelto and is supposed to complete the course in 2 more days -INR 1.52 -Would suggest continuing Xarelto through the remainder of his hospitalization and then discontinuing at the time of discharge  HTN -Continue Norvasc, Toprol XL -Suspect that current poor HTN control is a reflection of his acute illness -Will add prn hydralazine for SBP >180  HLD -Patient appears to have stopped taking his Lipitor -Suggest resumption of this medication as an inpatient or outpatient   DVT prophylaxis: Xarelto Code Status:  Full - confirmed with patient/family Family Communication: Wife present throughout evaluation  Disposition Plan:  Home once clinically improved Consults called: None  Admission status: Admit - It is my clinical opinion that admission to [facility] is reasonable and necessary because this patient will require at least 2 midnights in the hospital to treat this condition based on the medical complexity of the problems presented.  Given the aforementioned information, the predictability of an adverse outcome is felt to be significant.    Karmen Bongo MD [facility]  If  7PM-7AM, please contact night-coverage [email or web address] Password [email or web address]  03/26/2017, 11:12 PM

## 2017-03-26 NOTE — ED Triage Notes (Signed)
Correction-Right hip replacement

## 2017-03-26 NOTE — Assessment & Plan Note (Addendum)
Presumed UTI resistant to cipro with tachycardia, lower BP, fevers.  Recent foley use with surgery.  Not okay for outpatient f/u.  D/w PCP.  911 called.  EMS transport in route.  D/w pt and wife, he agrees with plan.  >25 minutes spent in face to face time with patient, >50% spent in counselling or coordination of care.

## 2017-03-27 DIAGNOSIS — N342 Other urethritis: Secondary | ICD-10-CM

## 2017-03-27 LAB — BASIC METABOLIC PANEL
ANION GAP: 8 (ref 5–15)
BUN: 11 mg/dL (ref 6–20)
CHLORIDE: 101 mmol/L (ref 101–111)
CO2: 23 mmol/L (ref 22–32)
Calcium: 8.2 mg/dL — ABNORMAL LOW (ref 8.9–10.3)
Creatinine, Ser: 0.89 mg/dL (ref 0.61–1.24)
GFR calc Af Amer: >60 mL/min (ref >60)
GFR calc non Af Amer: >60 mL/min (ref >60)
GLUCOSE: 158 mg/dL — AB (ref 65–99)
POTASSIUM: 3.9 mmol/L (ref 3.5–5.1)
Sodium: 132 mmol/L — ABNORMAL LOW (ref 135–145)

## 2017-03-27 LAB — CBC
HEMATOCRIT: 36.5 % — AB (ref 39.0–52.0)
Hemoglobin: 12 g/dL — ABNORMAL LOW (ref 13.0–17.0)
MCH: 31.7 pg (ref 26.0–34.0)
MCHC: 32.9 g/dL (ref 30.0–36.0)
MCV: 96.6 fL (ref 78.0–100.0)
Platelets: 298 10*3/uL (ref 150–400)
RBC: 3.78 MIL/uL — ABNORMAL LOW (ref 4.22–5.81)
RDW: 13.3 % (ref 11.5–15.5)
WBC: 7.8 10*3/uL (ref 4.0–10.5)

## 2017-03-27 LAB — GLUCOSE, CAPILLARY
GLUCOSE-CAPILLARY: 175 mg/dL — AB (ref 65–99)
Glucose-Capillary: 190 mg/dL — ABNORMAL HIGH (ref 65–99)
Glucose-Capillary: 154 mg/dL — ABNORMAL HIGH (ref 65–99)
Glucose-Capillary: 131 mg/dL — ABNORMAL HIGH (ref 65–99)

## 2017-03-27 LAB — URINE CULTURE: Culture: NO GROWTH

## 2017-03-27 LAB — LACTIC ACID, PLASMA: Lactic Acid, Venous: 1.2 mmol/L (ref 0.5–1.9)

## 2017-03-27 MED ORDER — AMLODIPINE BESYLATE 10 MG PO TABS
10.0000 mg | ORAL_TABLET | Freq: Every day | ORAL | Status: DC
  Administered 2017-03-27 – 2017-03-28 (×2): 10 mg via ORAL
  Filled 2017-03-27 (×2): qty 1

## 2017-03-27 NOTE — Care Management Note (Signed)
Case Management Note  Patient Details  Name: Devin Huffman MRN: [id number] Date of Birth: Apr 13, 1946  Subjective/Objective: 71 y/o m admitted w/Sepsis. From home. Active w/HHPT-rep [facility] aware & following for [place] orders. Recc-HHPT/HHRN-med mgmnt.                   Action/Plan:d/c home w/HHC.   Expected Discharge Date:   (unknown)               Expected Discharge Plan:  [place]  In-House Referral:     Discharge planning Services  CM Consult  Post Acute Care Choice:  Home Health (Active w/[facility] HHPT) Choice offered to:  Patient  DME Arranged:    DME Agency:     HH Arranged:    [place] Agency:     Status of Service:  In process, will continue to follow  If discussed at Long Length of Stay Meetings, dates discussed:    Additional Comments:  Dessa Phi, RN 03/27/2017, 12:15 PM

## 2017-03-27 NOTE — Progress Notes (Signed)
PROGRESS NOTE    Devin Huffman  MVH:[id number] DOB: 1946-06-05 DOA: 03/26/2017 PCP: Ria Bush, MD  Brief Narrative: Devin Huffman is a 71 y.o. male with medical history significant of HTN, HLD and recent hip replacement right leg 2 1/2 weeks ago (has been doing well) presenting with fever, nausea, weakness.  Thursday pm, he had a fever to 101.7.  Shaking/chills nightly.  Went to the walk-in clinic Saturday AM.  +dysuria, nausea.  Was given Cipro for UTI.  Fever did not resolve below 100.7.  Today is the 3rd day, not getting better.  Went back in today and his HR was 130, BP was 116/60 (very low for him).  He felt faint and weak.  They directed him to come to the hospital via ambulance. Admitted for Sepsis from UTI and is improving.   Assessment & Plan:   Principal Problem:   Sepsis ([place]) Active Problems:   Dyslipidemia   Essential hypertension   Prediabetes   History of right hip replacement   Sepsis likely from UTI -Elevated WBC count (11.2), fever (102.5), tachycardia (135) with elevated lactate to 3.13 on admission -UA: few bacteria, moderate Hgb, moderate LE, + mucous, negative nitrite, TNTC WBC; UA on 4/14 - large LE, positive nitrite. -Sepsis protocol initiated; received 2L IVF bolus in the ER and started on abx  -Suspect urinary source; he took Cipro x 3 days but did not improve, and given his recent hospitalization with instrumentation and hip replacement, will treat with the empiric algorithm for hospital-associated UTI (Cefepime) -Blood Cultures show NGTD < 24 hours and Urine Culture showed No Growth as he was already on Abx -Admitted totelemetry and continue to monitor; once sepsis physiology improves, he should no longer need telemetry -Will trend lactate to ensure improvement; LA went from 3.13 -> 1.2 -Will order non-ICU procalcitonin levels.  >0.5 indicates infection and >>0.5 indicates more serious disease. -Currently 0.70.  As the procalcitonin level normalizes,  it will be reasonable to consider de-escalation of antibiotic coverage. -C/w Acetaminophen 1,000 mg po q6hprn for Fever -WBC decreased from 17.7 -> 7.8 -C/w IV Abx with Cefepime 2 grams q12h  -C/w IVF with Lactated Ringers at 125 mL/hr  Prediabetes -Glucose 208 -HbA1c was 6.3 in 3/18 -He would likely benefit from oral hypoglycemic as an outpatient -Will change to Heart Healthy/Carb Modified Diet and cover with Moderate Novolog SSI AC -CBG's ranging from 175-190 -Hypoglycemia Protocol in Place  Legg-Perthes s/p Recent Right Hip Replacement -Done by Dr. Wynelle Link -Per patient and wife, he was doing very well with his recovery until Recent infection -He is on Xarelto and is supposed to complete the course in 1 more days -INR 1.52 -Will be continuing Xarelto through the remainder of his hospitalization and then discontinuing at the time of discharge -Pain control 5-10 mg po q4hprn Moderate Pain and with Methocarbamol 500 mg po pq6hprn for Muscle Spasms -C/w PT/OT while Hospitalized  HTN -Increased Amlodipine to 10 mg po Daily  -C/w Metoprolol Succinate 25 mg po qHS -Suspect that current poor HTN control is a reflection of his acute illness -Added IV Hydralazine 5 mg q4hprn for SBP >180  HLD -Check Lipid Panel in AM -Patient appears to have stopped taking his Lipitor -Suggest resumption of this medication as an inpatient or outpatient  Hyponatremia -Mild at 132 -Repeat CMP in AM  GERD -C/w Protonix 40 mg po Daily  DVT prophylaxis: Anticoagulated with Xarelto Code Status: FULL CODE Family Communication: Discussed with Daughter and Granddaughters at bedside  Disposition Plan: Home with Home Health when medically stable  Consultants:   None  Procedures: None   Antimicrobials: Anti-infectives    Start     Dose/Rate Route Frequency Ordered Stop   03/27/17 1000  ceFEPIme (MAXIPIME) 2 g in dextrose 5 % 50 mL IVPB     2 g 100 mL/hr over 30 Minutes Intravenous Every 12  hours 03/26/17 2117     03/26/17 2200  ceFEPIme (MAXIPIME) 2 g in dextrose 5 % 50 mL IVPB     2 g 100 mL/hr over 30 Minutes Intravenous  Once 03/26/17 2058 03/26/17 2202   03/26/17 1430  cefTRIAXone (ROCEPHIN) 1 g in dextrose 5 % 50 mL IVPB     1 g 100 mL/hr over 30 Minutes Intravenous  Once 03/26/17 1419 03/26/17 1654     Subjective: Seen and examined and was feeling better. No nausea or vomiting. States he has been urinating quite a bit. No CP or SOB. States Right hip feels better and has not been draining.   Objective: Vitals:   03/26/17 2034 03/26/17 2100 03/26/17 2101 03/27/17 0605  BP: (!) 165/71  (!) 148/75 (!) 189/80  Pulse: (!) 123  (!) 110 (!) 106  Resp: 18  (!) 22 (!) 21  Temp: (!) 102.5 F (39.2 C)  (!) 102.5 F (39.2 C) 99.7 F (37.6 C)  TempSrc: Oral  Oral Oral  SpO2: 94%  95% 98%  Weight:  106.6 kg (235 lb 0.2 oz)    Height:  6' (1.829 m)      Intake/Output Summary (Last 24 hours) at 03/27/17 0817 Last data filed at 03/27/17 0347  Gross per 24 hour  Intake             2100 ml  Output             1750 ml  Net              350 ml   Filed Weights   03/26/17 1357 03/26/17 2100  Weight: 105.2 kg (232 lb) 106.6 kg (235 lb 0.2 oz)   Examination: Physical Exam:  Constitutional: WN/WD, NAD and appears calm and comfortable sitting at bedside  Eyes: PERRL, lids and conjunctivae normal, sclerae anicteric  ENMT: External Ears, Nose appear normal. Grossly normal hearing. Mucous membranes are moist.   Neck: Appears normal, supple, no cervical masses, normal ROM, no appreciable thyromegaly, no JVD Respiratory: Clear to auscultation bilaterally, no wheezing, rales, rhonchi or crackles. Normal respiratory effort and patient is not tachypenic. No accessory muscle use.  Cardiovascular: RRR, no murmurs / rubs / gallops. S1 and S2 auscultated. No extremity edema. 2+ pedal pulses. No carotid bruits.  Abdomen: Soft, non-tender, non-distended. No masses palpated. No appreciable  hepatosplenomegaly. Bowel sounds positive x4.  GU: Deferred. Musculoskeletal: No clubbing / cyanosis of digits/nails. No joint deformity upper and lower extremities.  Skin: No rashes, lesions, ulcers. No induration; Warm and dry. Right Hip incision C/D/I with no purulent drainage.  Neurologic: CN 2-12 grossly intact with no focal deficits. Sensation intact in all 4 Extremities. Romberg sign cerebellar reflexes not assessed.  Psychiatric: Normal judgment and insight. Alert and oriented x 3. Normal mood and appropriate affect.   Data Reviewed: I have personally reviewed following labs and imaging studies  CBC:  Recent Labs Lab 03/26/17 1458 03/26/17 1709 03/27/17 0544  WBC 11.2*  --  7.8  NEUTROABS 9.3*  --   --   HGB 12.8* 12.9* 12.0*  HCT 38.4* 38.0* 36.5*  MCV 94.3  --  96.6  PLT 330  --  701   Basic Metabolic Panel:  Recent Labs Lab 03/26/17 1709 03/27/17 0544  NA 135 132*  K 4.7 3.9  CL 101 101  CO2  --  23  GLUCOSE 208* 158*  BUN 19 11  CREATININE 0.90 0.89  CALCIUM  --  8.2*   GFR: Estimated Creatinine Clearance: 94.7 mL/min (by C-G formula based on SCr of 0.89 mg/dL). Liver Function Tests: No results for input(s): AST, ALT, ALKPHOS, BILITOT, PROT, ALBUMIN in the last 168 hours. No results for input(s): LIPASE, AMYLASE in the last 168 hours. No results for input(s): AMMONIA in the last 168 hours. Coagulation Profile:  Recent Labs Lab 03/26/17 2131  INR 1.52   Cardiac Enzymes: No results for input(s): CKTOTAL, CKMB, CKMBINDEX, TROPONINI in the last 168 hours. BNP (last 3 results) No results for input(s): PROBNP in the last 8760 hours. HbA1C: No results for input(s): HGBA1C in the last 72 hours. CBG:  Recent Labs Lab 03/27/17 0736  GLUCAP 190*   Lipid Profile: No results for input(s): CHOL, HDL, LDLCALC, TRIG, CHOLHDL, LDLDIRECT in the last 72 hours. Thyroid Function Tests: No results for input(s): TSH, T4TOTAL, FREET4, T3FREE, THYROIDAB in the  last 72 hours. Anemia Panel: No results for input(s): VITAMINB12, FOLATE, FERRITIN, TIBC, IRON, RETICCTPCT in the last 72 hours. Sepsis Labs:  Recent Labs Lab 03/26/17 1510 03/26/17 1707 03/26/17 2131 03/26/17 2354  PROCALCITON  --   --  0.70  --   LATICACIDVEN 1.52 3.13* 1.5 1.2   No results found for this or any previous visit (from the past 240 hour(s)).   Radiology Studies: Dg Chest Port 1 View  Result Date: 03/26/2017 CLINICAL DATA:  71 y/o  M; fever and hypoxia. EXAM: PORTABLE CHEST 1 VIEW COMPARISON:  02/22/2017 chest radiograph FINDINGS: Stable heart size and mediastinal contours are within normal limits given projection and technique. Both lungs are clear. Mild S-shaped curvature of the spine. IMPRESSION: No active disease. Electronically Signed   By: Kristine Garbe M.D.   On: 03/26/2017 14:55   Scheduled Meds: . amLODipine  5 mg Oral QHS  . ceFEPime (MAXIPIME) IV  2 g Intravenous Q12H  . insulin aspart  0-15 Units Subcutaneous TID WC  . metoprolol succinate  25 mg Oral QHS  . pantoprazole  40 mg Oral Daily  . rivaroxaban  10 mg Oral Q breakfast  . sodium chloride flush  3 mL Intravenous Q12H   Continuous Infusions: . lactated ringers 125 mL/hr at 03/27/17 0713    LOS: 1 day   Kerney Elbe, DO [facility] Pager [phone]  If 7PM-7AM, please contact night-coverage [email or web address] Password [facility] 03/27/2017, 8:17 AM

## 2017-03-27 NOTE — Evaluation (Signed)
Physical Therapy Evaluation Patient Details Name: Devin Huffman MRN: [id number] DOB: 1946/11/20 Today's Date: 03/27/2017   History of Present Illness  71 yo male admitted with sepsis. Recent R THA 03/07/17.   Clinical Impression  On eval, pt was supervision level assist for mobility. Pt walked ~150 feet with a RW. Pt tolerated activity well. Recommend pt resume HHPT (for THA) once discharged. Will follow.     Follow Up Recommendations Home health PT    Equipment Recommendations  None recommended by PT    Recommendations for Other Services       Precautions / Restrictions Precautions Precautions: Fall Restrictions Weight Bearing Restrictions: No Other Position/Activity Restrictions: WBAT      Mobility  Bed Mobility Overal bed mobility: Needs Assistance Bed Mobility: Supine to Sit;Sit to Supine     Supine to sit: Supervision Sit to supine: Supervision   General bed mobility comments: for safety, IV line  Transfers Overall transfer level: Needs assistance   Transfers: Sit to/from Stand Sit to Stand: Supervision         General transfer comment: for safety. VCs hand placement  Ambulation/Gait Ambulation/Gait assistance: Supervision Ambulation Distance (Feet): 150 Feet Assistive device: Rolling walker (2 wheeled) Gait Pattern/deviations: Step-through pattern;Decreased stride length     General Gait Details: for safety  Stairs            Wheelchair Mobility    Modified Rankin (Stroke Patients Only)       Balance                                             Pertinent Vitals/Pain Pain Assessment: Faces Faces Pain Scale: Hurts a little bit Pain Location: R hip Pain Descriptors / Indicators: Sore Pain Intervention(s): Monitored during session;Repositioned    Home Living Family/patient expects to be discharged to:: Private residence Living Arrangements: Spouse/significant other Available Help at Discharge: Family Type of  Home: House Home Access: Stairs to enter Entrance Stairs-Rails: Right Entrance Stairs-Number of Steps: 3 without rail vs 7 with rail Home Layout: One level Home Equipment: Environmental consultant - 2 wheels;Bedside commode      Prior Function Level of Independence: Independent with assistive device(s)         Comments: still using RW but starting to walk a little without a device.      Hand Dominance        Extremity/Trunk Assessment   Upper Extremity Assessment Upper Extremity Assessment: Overall WFL for tasks assessed    Lower Extremity Assessment Lower Extremity Assessment: Generalized weakness    Cervical / Trunk Assessment Cervical / Trunk Assessment: Normal  Communication   Communication: No difficulties  Cognition Arousal/Alertness: Awake/alert Behavior During Therapy: WFL for tasks assessed/performed Overall Cognitive Status: Within Functional Limits for tasks assessed                                        General Comments      Exercises     Assessment/Plan    PT Assessment Patient needs continued PT services  PT Problem List Decreased mobility;Decreased strength;Decreased activity tolerance;Decreased balance;Decreased knowledge of use of DME;Pain       PT Treatment Interventions DME instruction;Gait training;Therapeutic activities;Therapeutic exercise;Patient/family education;Functional mobility training;Balance training    PT Goals (Current goals can be found in  the Care Plan section)  Acute Rehab PT Goals Patient Stated Goal: Regain IND and walk without pain or a device PT Goal Formulation: With patient Time For Goal Achievement: 04/10/17 Potential to Achieve Goals: Good    Frequency Min 3X/week   Barriers to discharge        Co-evaluation               End of Session Equipment Utilized During Treatment: Gait belt Activity Tolerance: Patient tolerated treatment well Patient left: with call bell/phone within reach;with  family/visitor present   PT Visit Diagnosis: Muscle weakness (generalized) (M62.81);Difficulty in walking, not elsewhere classified (R26.2)    Time: 5750-5183 PT Time Calculation (min) (ACUTE ONLY): 15 min   Charges:   PT Evaluation $PT Eval Low Complexity: 1 Procedure     PT G Codes:          Weston Anna, MPT Pager: [phone]

## 2017-03-28 DIAGNOSIS — A419 Sepsis, unspecified organism: Principal | ICD-10-CM

## 2017-03-28 DIAGNOSIS — N390 Urinary tract infection, site not specified: Secondary | ICD-10-CM

## 2017-03-28 DIAGNOSIS — R7303 Prediabetes: Secondary | ICD-10-CM

## 2017-03-28 DIAGNOSIS — Z96641 Presence of right artificial hip joint: Secondary | ICD-10-CM

## 2017-03-28 LAB — CBC WITH DIFFERENTIAL/PLATELET
BASOS PCT: 0 %
Basophils Absolute: 0 10*3/uL (ref 0.0–0.1)
EOS ABS: 0 10*3/uL (ref 0.0–0.7)
Eosinophils Relative: 1 %
HCT: 36.3 % — ABNORMAL LOW (ref 39.0–52.0)
HEMOGLOBIN: 12.1 g/dL — AB (ref 13.0–17.0)
LYMPHS ABS: 1.6 10*3/uL (ref 0.7–4.0)
Lymphocytes Relative: 18 %
MCH: 31.8 pg (ref 26.0–34.0)
MCHC: 33.3 g/dL (ref 30.0–36.0)
MCV: 95.5 fL (ref 78.0–100.0)
MONO ABS: 1.2 10*3/uL — AB (ref 0.1–1.0)
Monocytes Relative: 13 %
NEUTROS PCT: 68 %
Neutro Abs: 5.9 10*3/uL (ref 1.7–7.7)
Platelets: 297 10*3/uL (ref 150–400)
RBC: 3.8 MIL/uL — ABNORMAL LOW (ref 4.22–5.81)
RDW: 13.4 % (ref 11.5–15.5)
WBC: 8.7 10*3/uL (ref 4.0–10.5)

## 2017-03-28 LAB — COMPREHENSIVE METABOLIC PANEL
ALBUMIN: 2.7 g/dL — AB (ref 3.5–5.0)
ALT: 39 U/L (ref 17–63)
ANION GAP: 8 (ref 5–15)
AST: 37 U/L (ref 15–41)
Alkaline Phosphatase: 141 U/L — ABNORMAL HIGH (ref 38–126)
BUN: 12 mg/dL (ref 6–20)
CO2: 24 mmol/L (ref 22–32)
Calcium: 8.4 mg/dL — ABNORMAL LOW (ref 8.9–10.3)
Chloride: 102 mmol/L (ref 101–111)
Creatinine, Ser: 0.81 mg/dL (ref 0.61–1.24)
GFR calc non Af Amer: >60 mL/min (ref >60)
Glucose, Bld: 128 mg/dL — ABNORMAL HIGH (ref 65–99)
POTASSIUM: 3.8 mmol/L (ref 3.5–5.1)
Sodium: 134 mmol/L — ABNORMAL LOW (ref 135–145)
TOTAL PROTEIN: 6.3 g/dL — AB (ref 6.5–8.1)
Total Bilirubin: 0.3 mg/dL (ref 0.3–1.2)

## 2017-03-28 LAB — GLUCOSE, CAPILLARY
GLUCOSE-CAPILLARY: 132 mg/dL — AB (ref 65–99)
Glucose-Capillary: 126 mg/dL — ABNORMAL HIGH (ref 65–99)

## 2017-03-28 LAB — PHOSPHORUS: Phosphorus: 3.6 mg/dL (ref 2.5–4.6)

## 2017-03-28 LAB — MAGNESIUM: MAGNESIUM: 1.9 mg/dL (ref 1.7–2.4)

## 2017-03-28 MED ORDER — AMLODIPINE BESYLATE 10 MG PO TABS: 10.0000 mg | ORAL_TABLET | Freq: Every day | ORAL | 0 refills | Status: DC

## 2017-03-28 MED ORDER — CEFDINIR 300 MG PO CAPS: 300.0000 mg | ORAL_CAPSULE | Freq: Two times a day (BID) | ORAL | 0 refills | Status: AC

## 2017-03-28 NOTE — Care Management Note (Signed)
Case Management Note  Patient Details  Name: Devin Huffman MRN: [id number] Date of Birth: 1946/09/09  Subjective/Objective:  [facility]-rep [facility] aware of HHPT order. No further CM needs.                  Action/Plan:d/c home w/HHPT   Expected Discharge Date:  03/28/17               Expected Discharge Plan:  [place]  In-House Referral:     Discharge planning Services  CM Consult  Post Acute Care Choice:  Home Health (Active w/[facility] HHPT) Choice offered to:  Patient  DME Arranged:    DME Agency:     HH Arranged:  PT [place]:  [place]  Status of Service:  Completed, signed off  If discussed at [place] of Stay Meetings, dates discussed:    Additional Comments:  Dessa Phi, RN 03/28/2017, 1:17 PM

## 2017-03-28 NOTE — Discharge Summary (Signed)
Physician Discharge Summary  KAEDAN RICHERT  SFK:[id number]  DOB: Aug 02, 1946  DOA: 03/26/2017 PCP: Ria Bush, MD  Admit date: 03/26/2017 Discharge date: 03/28/2017  Admitted From: Home  Disposition:  Home   Recommendations for Outpatient Follow-up:  1. Follow up with PCP in 1-2 weeks 2. Please obtain BMP/CBC in one week 3. Please follow up on the following pending results: blood cultures   Home Health: [facility]  Discharge Condition: Stable   CODE STATUS: FULL Diet recommendation: Heart Healthy   Brief/Interim Summary: 71 y/o M with medical hx of HTN, and HLD with a recent hip replacement on his R leg about 2 1/2 weeks ago, presented to the ED c/o fever, nausea and generalized weakness. Patient was treated with Cipro 3 days PTA for suspected UTI. But febrile episodes did not resolved. Patient was found to be tachycardic, febrile and soft BP for which he was admitted for suspected sepsis and UTI. Patient was treated with IV abx, IVF, and blood and urine cultures were obtained. Patient subsqeuently improved and has remained afebrile. Urine cultures were negative. Patient clinically improved and will be discharge home with oral antibiotics to complete 5 day course.   Subjective: Patient seen and examined, have no complaints this AM, patient remains afebrile for the past 24 hrs. Patient tolerating diet well and ambulating with PT   Discharge Diagnoses/Hospital Course:  Sepsis due to UTI - sepsis physiology resolved  -Initially with elevated WBC count (11.2), fever (102.5), tachycardia (135)with elevated lactate to 3.13  -Initially treated with IVF and broad spectrum abx - Cefepime, due to recent instrumentation and failure of outpatient therapy -Blood Cultures show NGTD  -Urine cultures No growth, patient was in abx prior to collection  -Continue Tylenol PRN fever -Discharge on Cefdinir x 5 days  -Follow up with PCP in 1 week   Prediabetes -Glucose 208  -HbA1c was 6.3 in  3/18 -Will defer to PCP if oral hypoglycemic needed -CBG's stable in view of infectious process   Legg-Perthes s/p Recent Right Hip Replacement -Stale  -Patient was on Xarelto for DVT prophylaxis - last dose today  -Will begin ASA for DVT prophylaxis  -Continue PT, HH PT ordered   HTN - slight elevated during hospital stay  Amlodipine was increased to 10 mg  -No changes on Metoprolol Succinate 25 mg po -Follow up with PCP in 1 week   HLD -Not taking Lipitor  -Follow up with PCP   All other chronic medical condition were stable during the hospitalization.  Patient was seen by physical therapy, recommending HH PT On the day of the discharge the patient's vitals were stable, and no other acute medical condition were reported by patient. Patient was felt safe to be discharge to home  Discharge Instructions  You were cared for by a hospitalist during your hospital stay. If you have any questions about your discharge medications or the care you received while you were in the hospital after you are discharged, you can call the unit and asked to speak with the hospitalist on call if the hospitalist that took care of you is not available. Once you are discharged, your primary care physician will handle any further medical issues. Please note that NO REFILLS for any discharge medications will be authorized once you are discharged, as it is imperative that you return to your primary care physician (or establish a relationship with a primary care physician if you do not have one) for your aftercare needs so that they can reassess  your need for medications and monitor your lab values.  Discharge Instructions    Call MD for:  difficulty breathing, headache or visual disturbances    Complete by:  As directed    Call MD for:  extreme fatigue    Complete by:  As directed    Call MD for:  hives    Complete by:  As directed    Call MD for:  persistant dizziness or light-headedness    Complete by:   As directed    Call MD for:  persistant nausea and vomiting    Complete by:  As directed    Call MD for:  redness, tenderness, or signs of infection (pain, swelling, redness, odor or green/yellow discharge around incision site)    Complete by:  As directed    Call MD for:  severe uncontrolled pain    Complete by:  As directed    Call MD for:  temperature >100.4    Complete by:  As directed    Diet - low sodium heart healthy    Complete by:  As directed    Diet Carb Modified    Complete by:  As directed    Increase activity slowly    Complete by:  As directed      Allergies as of 03/28/2017      Reactions   Lisinopril Hives, Swelling   ?angioedema - lip swelling, hives      Medication List    STOP taking these medications   rivaroxaban 10 MG Tabs tablet Commonly known as:  XARELTO     TAKE these medications   amLODipine 10 MG tablet Commonly known as:  NORVASC Take 1 tablet (10 mg total) by mouth daily. Start taking on:  03/29/2017 What changed:  medication strength  how much to take   cefdinir 300 MG capsule Commonly known as:  OMNICEF Take 1 capsule (300 mg total) by mouth 2 (two) times daily.   methocarbamol 500 MG tablet Commonly known as:  ROBAXIN Take 1 tablet (500 mg total) by mouth every 6 (six) hours as needed for muscle spasms.   metoprolol succinate 25 MG 24 hr tablet Commonly known as:  TOPROL-XL Take 1 tablet (25 mg total) by mouth daily. What changed:  when to take this   omeprazole 20 MG capsule Commonly known as:  PRILOSEC TAKE ONE CAPSULE BY MOUTH ONCE DAILY AS NEEDED What changed:  See the new instructions.   oxyCODONE 5 MG immediate release tablet Commonly known as:  Oxy IR/ROXICODONE Take 1-2 tablets (5-10 mg total) by mouth every 4 (four) hours as needed for moderate pain or severe pain.      Follow-up Information    Ria Bush, MD. Schedule an appointment as soon as possible for a visit in 1 week(s).   Specialty:  Family  Medicine Contact information: [place] [address] [phone]          Allergies  Allergen Reactions  . Lisinopril Hives and Swelling    ?angioedema - lip swelling, hives    Consultations:  None   Procedures/Studies: Dg Pelvis Portable  Result Date: 03/07/2017 CLINICAL DATA:  Hip replacement. EXAM: PORTABLE PELVIS 1-2 VIEWS COMPARISON:  04/23/2013 . FINDINGS: Lumbar spine fusion. Total right hip replacement. Hardware intact. Anatomic alignment. Pelvic calcifications consistent phleboliths. Prostate calcifications. IMPRESSION: Lumbar spine fusion. Total right hip replacement. Hardware intact. Anatomic alignment . Electronically Signed   By: Marcello Moores  Register   On: 03/07/2017 13:09  Dg Chest Port 1 View  Result Date: 03/26/2017 CLINICAL DATA:  71 y/o  M; fever and hypoxia. EXAM: PORTABLE CHEST 1 VIEW COMPARISON:  02/22/2017 chest radiograph FINDINGS: Stable heart size and mediastinal contours are within normal limits given projection and technique. Both lungs are clear. Mild S-shaped curvature of the spine. IMPRESSION: No active disease. Electronically Signed   By: Kristine Garbe M.D.   On: 03/26/2017 14:55   Dg C-arm 1-60 Min-no Report  Result Date: 03/07/2017 Fluoroscopy was utilized by the requesting physician.  No radiographic interpretation.    Discharge Exam: Vitals:   03/27/17 2152 03/28/17 0418  BP: 135/60 (!) 153/82  Pulse: 96 83  Resp: 18 18  Temp: 99.1 F (37.3 C) 98.5 F (36.9 C)   Vitals:   03/27/17 1444 03/27/17 1700 03/27/17 2152 03/28/17 0418  BP: 138/69  135/60 (!) 153/82  Pulse: 95  96 83  Resp: 19  18 18   Temp: 100.3 F (37.9 C) 98.7 F (37.1 C) 99.1 F (37.3 C) 98.5 F (36.9 C)  TempSrc: Oral Oral Oral Oral  SpO2: 94%  94% 94%  Weight:      Height:        General: Pt is alert, awake, not in acute distress Cardiovascular: RRR, S1/S2 +, no rubs, no gallops Respiratory: CTA bilaterally, no wheezing, no  rhonchi Extremities: no edema, no cyanosis   The results of significant diagnostics from this hospitalization (including imaging, microbiology, ancillary and laboratory) are listed below for reference.     Microbiology: Recent Results (from the past 240 hour(s))  Urine C&S     Status: None   Collection Time: 03/26/17  2:58 PM  Result Value Ref Range Status   Specimen Description URINE, CLEAN CATCH  Final   Special Requests NONE  Final   Culture   Final    NO GROWTH Performed at [facility], [address] [place], [place] [address]    Report Status 03/27/2017 FINAL  Final  Blood culture (routine x 2)     Status: None (Preliminary result)   Collection Time: 03/26/17  2:58 PM  Result Value Ref Range Status   Specimen Description BLOOD RIGHT HAND  Final   Special Requests   Final    BOTTLES DRAWN AEROBIC ONLY Blood Culture adequate volume   Culture   Final    NO GROWTH 2 DAYS Performed at [facility], [place] [address] [place], [place] [address]    Report Status PENDING  Incomplete  Blood culture (routine x 2)     Status: None (Preliminary result)   Collection Time: 03/26/17  3:00 PM  Result Value Ref Range Status   Specimen Description BLOOD LEFT ANTECUBITAL  Final   Special Requests   Final    BOTTLES DRAWN AEROBIC ONLY Blood Culture adequate volume   Culture   Final    NO GROWTH 2 DAYS Performed at [facility], [place] [address], [place], [place] [address]    Report Status PENDING  Incomplete     Labs: BNP (last 3 results) No results for input(s): BNP in the last 8760 hours. Basic Metabolic Panel:  Recent Labs Lab 03/26/17 1709 03/27/17 0544 03/28/17 0549  NA 135 132* 134*  K 4.7 3.9 3.8  CL 101 101 102  CO2  --  23 24  GLUCOSE 208* 158* 128*  BUN 19 11 12   CREATININE 0.90 0.89 0.81  CALCIUM  --  8.2* 8.4*  MG  --   --  1.9  PHOS  --   --  3.6   Liver Function Tests:  Recent Labs Lab 03/28/17 0549  AST 37  ALT 39  ALKPHOS 141*   BILITOT 0.3  PROT 6.3*  ALBUMIN 2.7*   No results for input(s): LIPASE, AMYLASE in the last 168 hours. No results for input(s): AMMONIA in the last 168 hours. CBC:  Recent Labs Lab 03/26/17 1458 03/26/17 1709 03/27/17 0544 03/28/17 0549  WBC 11.2*  --  7.8 8.7  NEUTROABS 9.3*  --   --  5.9  HGB 12.8* 12.9* 12.0* 12.1*  HCT 38.4* 38.0* 36.5* 36.3*  MCV 94.3  --  96.6 95.5  PLT 330  --  298 297   Cardiac Enzymes: No results for input(s): CKTOTAL, CKMB, CKMBINDEX, TROPONINI in the last 168 hours. BNP: Invalid input(s): POCBNP CBG:  Recent Labs Lab 03/27/17 1135 03/27/17 1624 03/27/17 2150 03/28/17 0741 03/28/17 1207  GLUCAP 175* 154* 131* 126* 132*   D-Dimer No results for input(s): DDIMER in the last 72 hours. Hgb A1c No results for input(s): HGBA1C in the last 72 hours. Lipid Profile No results for input(s): CHOL, HDL, LDLCALC, TRIG, CHOLHDL, LDLDIRECT in the last 72 hours. Thyroid function studies No results for input(s): TSH, T4TOTAL, T3FREE, THYROIDAB in the last 72 hours.  Invalid input(s): FREET3 Anemia work up No results for input(s): VITAMINB12, FOLATE, FERRITIN, TIBC, IRON, RETICCTPCT in the last 72 hours. Urinalysis    Component Value Date/Time   COLORURINE YELLOW 03/26/2017 1458   APPEARANCEUR HAZY (A) 03/26/2017 1458   LABSPEC 1.025 03/26/2017 1458   PHURINE 6.0 03/26/2017 1458   GLUCOSEU NEGATIVE 03/26/2017 1458   HGBUR MODERATE (A) 03/26/2017 1458   BILIRUBINUR NEGATIVE 03/26/2017 1458   BILIRUBINUR neg 03/24/2017 1112   KETONESUR NEGATIVE 03/26/2017 1458   PROTEINUR 30 (A) 03/26/2017 1458   UROBILINOGEN 0.2 03/24/2017 1112   UROBILINOGEN 0.2 04/15/2013 0838   NITRITE NEGATIVE 03/26/2017 1458   LEUKOCYTESUR MODERATE (A) 03/26/2017 1458   Sepsis Labs Invalid input(s): PROCALCITONIN,  WBC,  LACTICIDVEN Microbiology Recent Results (from the past 240 hour(s))  Urine C&S     Status: None   Collection Time: 03/26/17  2:58 PM  Result  Value Ref Range Status   Specimen Description URINE, CLEAN CATCH  Final   Special Requests NONE  Final   Culture   Final    NO GROWTH Performed at [facility], [place] [address], [place], [place] [address]    Report Status 03/27/2017 FINAL  Final  Blood culture (routine x 2)     Status: None (Preliminary result)   Collection Time: 03/26/17  2:58 PM  Result Value Ref Range Status   Specimen Description BLOOD RIGHT HAND  Final   Special Requests   Final    BOTTLES DRAWN AEROBIC ONLY Blood Culture adequate volume   Culture   Final    NO GROWTH 2 DAYS Performed at [facility], [place] [address], [place], [place] [address]    Report Status PENDING  Incomplete  Blood culture (routine x 2)     Status: None (Preliminary result)   Collection Time: 03/26/17  3:00 PM  Result Value Ref Range Status   Specimen Description BLOOD LEFT ANTECUBITAL  Final   Special Requests   Final    BOTTLES DRAWN AEROBIC ONLY Blood Culture adequate volume   Culture   Final    NO GROWTH 2 DAYS Performed at [facility], [place],  [place] [address]    Report Status PENDING  Incomplete    Time coordinating discharge: 38 minutes  SIGNED:  Chipper Oman, MD  [facility] 03/28/2017, 9:26 PM  Pager please text page via  [email or web address] Password [email or web address]

## 2017-03-28 NOTE — Progress Notes (Signed)
OT Cancellation Note  Patient Details Name: Devin Huffman MRN: [id number] DOB: July 24, 1946   Cancelled Treatment:    Reason Eval/Treat Not Completed: OT screened, no needs identified, will sign off. Pt had recent THA and is moving well with PT.  No concerns for OT at this time.  Sofya Moustafa 03/28/2017, 11:38 AM  Lesle Chris, OTR/L [phone] 03/28/2017

## 2017-03-29 ENCOUNTER — Telehealth: Payer: Self-pay | Admitting: *Deleted

## 2017-03-29 NOTE — Telephone Encounter (Signed)
Transition Care Management Follow-up Telephone Call   Date discharged?03/28/2017   How have you been since you were released from the hospital? "a little better"   Do you understand why you were in the hospital? yes, "I was in bad shape."   Do you understand the discharge instructions? yes   Where were you discharged to? home   Items Reviewed:  Medications reviewed: yes  Allergies reviewed: yes  Dietary changes reviewed: yes  Referrals reviewed: yes   Functional Questionnaire:   Activities of Daily Living (ADLs):   He states they are independent in the following: all. wife provides any assistance required    Any transportation issues/concerns?: no   Any patient concerns? no   Confirmed importance and date/time of follow-up visits scheduled yes  Provider Appointment booked with Dr Danise Mina 04/03/17  Confirmed with patient if condition begins to worsen call PCP or go to the ER.  Patient was given the office number and encouraged to call back with question or concerns.  : yes

## 2017-03-31 LAB — CULTURE, BLOOD (ROUTINE X 2)
CULTURE: NO GROWTH
Culture: NO GROWTH
Special Requests: ADEQUATE
Special Requests: ADEQUATE

## 2017-04-02 DIAGNOSIS — Z96641 Presence of right artificial hip joint: Secondary | ICD-10-CM | POA: Diagnosis not present

## 2017-04-02 DIAGNOSIS — I10 Essential (primary) hypertension: Secondary | ICD-10-CM | POA: Diagnosis not present

## 2017-04-02 DIAGNOSIS — Z471 Aftercare following joint replacement surgery: Secondary | ICD-10-CM | POA: Diagnosis not present

## 2017-04-02 DIAGNOSIS — Z7901 Long term (current) use of anticoagulants: Secondary | ICD-10-CM | POA: Diagnosis not present

## 2017-04-02 DIAGNOSIS — M109 Gout, unspecified: Secondary | ICD-10-CM | POA: Diagnosis not present

## 2017-04-02 DIAGNOSIS — K219 Gastro-esophageal reflux disease without esophagitis: Secondary | ICD-10-CM | POA: Diagnosis not present

## 2017-04-03 ENCOUNTER — Encounter: Payer: Self-pay | Admitting: Family Medicine

## 2017-04-03 ENCOUNTER — Ambulatory Visit (INDEPENDENT_AMBULATORY_CARE_PROVIDER_SITE_OTHER): Payer: Medicare HMO | Admitting: Family Medicine

## 2017-04-03 VITALS — BP 118/74 | HR 88 | Temp 98.2°F | Wt 230.5 lb

## 2017-04-03 DIAGNOSIS — A419 Sepsis, unspecified organism: Secondary | ICD-10-CM | POA: Diagnosis not present

## 2017-04-03 DIAGNOSIS — Z96641 Presence of right artificial hip joint: Secondary | ICD-10-CM

## 2017-04-03 DIAGNOSIS — I10 Essential (primary) hypertension: Secondary | ICD-10-CM

## 2017-04-03 DIAGNOSIS — N3000 Acute cystitis without hematuria: Secondary | ICD-10-CM | POA: Insufficient documentation

## 2017-04-03 LAB — POC URINALSYSI DIPSTICK (AUTOMATED)
BILIRUBIN UA: NEGATIVE
GLUCOSE UA: NEGATIVE
Ketones, UA: NEGATIVE
NITRITE UA: NEGATIVE
PH UA: 6 (ref 5.0–8.0)
RBC UA: NEGATIVE
Spec Grav, UA: >=1.03 — AB (ref 1.010–1.025)
UROBILINOGEN UA: 0.2 U/dL

## 2017-04-03 MED ORDER — AMLODIPINE BESYLATE 5 MG PO TABS: 5.0000 mg | ORAL_TABLET | Freq: Every day | ORAL | 1 refills | Status: DC

## 2017-04-03 MED ORDER — METOPROLOL SUCCINATE ER 25 MG PO TB24: 25.0000 mg | ORAL_TABLET | Freq: Every day | ORAL | 1 refills | Status: DC

## 2017-04-03 NOTE — Assessment & Plan Note (Addendum)
UCx and blcx x2 negative in hospital (after pt already on cipro). Seems to be resolving. See below.

## 2017-04-03 NOTE — Assessment & Plan Note (Addendum)
Seems to be recovering well from hip replacement. No signs of hardware infection

## 2017-04-03 NOTE — Progress Notes (Signed)
Pre visit review using our clinic review tool, if applicable. No additional management support is needed unless otherwise documented below in the visit note. 

## 2017-04-03 NOTE — Patient Instructions (Addendum)
We will send urine culture today. Ok to decrease amlodipine back to 5mg  daily given symptoms.  Monitor blood pressure at home and let me know if consistently >140/90. Work on low salt diet, good potassium rich diet, lots of water. Increase water and take some cranberry juice - but careful with sugar content of the juice.

## 2017-04-03 NOTE — Progress Notes (Signed)
BP 118/74 (BP Location: Right Arm, Cuff Size: Large)   Pulse 88   Temp 98.2 F (36.8 C) (Oral)   Wt 230 lb 8 oz (104.6 kg)   BMI 31.26 kg/m    CC: hospital f/u visit Subjective:    Patient ID: Devin Huffman, male    DOB: January 10, 1946, 71 y.o.   MRN: [id number]  HPI: Devin Huffman is a 71 y.o. male presenting on 04/03/2017 for Follow-up (hospital)   Reviewed Dr Josefine Class notes and hospital records. Recent R hip replacement complicated by febrile UTI not responsive to PO cipro. Admitted to hospital with fever, tachycardia, sepsis with elevated lactic acid level. Treated with IVF and IV abx (cefepime), discharged on cefdinir. UCx negative (checked after cipro was started).  UCx no growth Blcx x2 no growth  UTI sxs have resolved. No more dysuria, urgency, fevers.   Has completed xarelto for DVT prophylaxis s/p hip replacement. Started on aspirin.  Amlodipine was increased to 10mg  daily.  Not taking lipitor since 09/2016 - caused worsening arthralgias/myalgias.   He feels current BP is too low - endorsing some dizziness and clamminess.    Admit date: 03/26/2017 Discharge date: 03/28/2017 TCM f/u phone call 03/29/2017  Admitted From: Home  Disposition:  Home   Recommendations for Outpatient Follow-up:  1. Follow up with PCP in 1-2 weeks 2. Please obtain BMP/CBC in one week 3. Please follow up on the following pending results: blood cultures   Home Health: [facility]  Discharge Condition: Stable   CODE STATUS: FULL Diet recommendation: Heart Healthy   Relevant past medical, surgical, family and social history reviewed and updated as indicated. Interim medical history since our last visit reviewed. Allergies and medications reviewed and updated. Outpatient Medications Prior to Visit  Medication Sig Dispense Refill  . omeprazole (PRILOSEC) 20 MG capsule TAKE ONE CAPSULE BY MOUTH ONCE DAILY AS NEEDED (Patient taking differently: TAKE ONE CAPSULE BY MOUTH ONCE AT BEDTIME AS  NEEDED FOR ACID REFLUX/HEARTBURN) 90 capsule 1  . amLODipine (NORVASC) 10 MG tablet Take 1 tablet (10 mg total) by mouth daily. 30 tablet 0  . metoprolol succinate (TOPROL-XL) 25 MG 24 hr tablet Take 1 tablet (25 mg total) by mouth daily. (Patient taking differently: Take 25 mg by mouth at bedtime. ) 90 tablet 1  . methocarbamol (ROBAXIN) 500 MG tablet Take 1 tablet (500 mg total) by mouth every 6 (six) hours as needed for muscle spasms. 80 tablet 0  . oxyCODONE (OXY IR/ROXICODONE) 5 MG immediate release tablet Take 1-2 tablets (5-10 mg total) by mouth every 4 (four) hours as needed for moderate pain or severe pain. 84 tablet 0   No facility-administered medications prior to visit.      Per HPI unless specifically indicated in ROS section below Review of Systems     Objective:    BP 118/74 (BP Location: Right Arm, Cuff Size: Large)   Pulse 88   Temp 98.2 F (36.8 C) (Oral)   Wt 230 lb 8 oz (104.6 kg)   BMI 31.26 kg/m   Wt Readings from Last 3 Encounters:  04/03/17 230 lb 8 oz (104.6 kg)  03/26/17 235 lb 0.2 oz (106.6 kg)  03/26/17 232 lb 12 oz (105.6 kg)    Physical Exam  Constitutional: He appears well-developed and well-nourished. No distress.  Walks with cane  HENT:  Mouth/Throat: Oropharynx is clear and moist. No oropharyngeal exudate.  Eyes: Conjunctivae and EOM are normal. Pupils are equal, round, and reactive to  light.  Neck: Normal range of motion. Neck supple.  Cardiovascular: Normal rate, regular rhythm, normal heart sounds and intact distal pulses.   No murmur heard. Pulmonary/Chest: Effort normal and breath sounds normal. No respiratory distress. He has no wheezes. He has no rales.  Abdominal: Soft. Normal appearance and bowel sounds are normal. He exhibits no distension and no mass. There is no hepatosplenomegaly. There is no tenderness. There is no rigidity, no rebound, no guarding, no CVA tenderness and negative Murphy's sign.  Musculoskeletal: He exhibits no  edema.  Skin: Skin is warm and dry. No rash noted.  Psychiatric: He has a normal mood and affect.  Nursing note and vitals reviewed.  Results for orders placed or performed in visit on 04/03/17  POCT Urinalysis Dipstick (Automated)  Result Value Ref Range   Color, UA Yellow    Clarity, UA Clear    Glucose, UA Negative    Bilirubin, UA Negative    Ketones, UA Negative    Spec Grav, UA >=1.030 (A) 1.010 - 1.025   Blood, UA Negative    pH, UA 6.0 5.0 - 8.0   Protein, UA Trace    Urobilinogen, UA 0.2 0.2 or 1.0 E.U./dL   Nitrite, UA Negative    Leukocytes, UA Moderate (2+) (A) Negative      Assessment & Plan:   Problem List Items Addressed This Visit    Acute cystitis without hematuria - Primary    Presumed urosepsis but cultures were negative (obtained on antibiotics). Regardless, largely improved now, but persistent malaise. He has completed antibiotic regimen of IV cefipime then cefdinir. UA today with 2+ LE. Will send culture. Encouraged increased water intake as well as cranberry juice (sugar free if able).      Relevant Orders   POCT Urinalysis Dipstick (Automated) (Completed)   Urine culture   Essential hypertension    Amlodipine was increased during hospitalization due to uncontrolled blood pressure readings however now patient endorses hypotensive symptoms of dizziness, clamminess, general malaise. Will return to prior dose of 5mg  daily, and I've asked him to start monitoring blood pressures more regularly and notify me if persistently high >140/90.  Continue toprol XL 25mg  daily.      Relevant Medications   aspirin EC 81 MG tablet   amLODipine (NORVASC) 5 MG tablet   metoprolol succinate (TOPROL-XL) 25 MG 24 hr tablet   History of right hip replacement    Seems to be recovering well from hip replacement. No signs of hardware infection      Sepsis ([place])    UCx and blcx x2 negative in hospital (after pt already on cipro). Seems to be resolving. See below.             Follow up plan: Return in about 3 months (around 07/03/2017) for follow up visit.  Ria Bush, MD

## 2017-04-03 NOTE — Assessment & Plan Note (Signed)
Presumed urosepsis but cultures were negative (obtained on antibiotics). Regardless, largely improved now, but persistent malaise. He has completed antibiotic regimen of IV cefipime then cefdinir. UA today with 2+ LE. Will send culture. Encouraged increased water intake as well as cranberry juice (sugar free if able).

## 2017-04-03 NOTE — Assessment & Plan Note (Signed)
Amlodipine was increased during hospitalization due to uncontrolled blood pressure readings however now patient endorses hypotensive symptoms of dizziness, clamminess, general malaise. Will return to prior dose of 5mg  daily, and I've asked him to start monitoring blood pressures more regularly and notify me if persistently high >140/90.  Continue toprol XL 25mg  daily.

## 2017-04-04 LAB — URINE CULTURE: Organism ID, Bacteria: NO GROWTH

## 2017-04-05 DIAGNOSIS — Z7901 Long term (current) use of anticoagulants: Secondary | ICD-10-CM | POA: Diagnosis not present

## 2017-04-05 DIAGNOSIS — K219 Gastro-esophageal reflux disease without esophagitis: Secondary | ICD-10-CM | POA: Diagnosis not present

## 2017-04-05 DIAGNOSIS — I10 Essential (primary) hypertension: Secondary | ICD-10-CM | POA: Diagnosis not present

## 2017-04-05 DIAGNOSIS — Z96641 Presence of right artificial hip joint: Secondary | ICD-10-CM | POA: Diagnosis not present

## 2017-04-05 DIAGNOSIS — Z471 Aftercare following joint replacement surgery: Secondary | ICD-10-CM | POA: Diagnosis not present

## 2017-04-05 DIAGNOSIS — M109 Gout, unspecified: Secondary | ICD-10-CM | POA: Diagnosis not present

## 2017-04-06 ENCOUNTER — Encounter: Payer: Self-pay | Admitting: Family Medicine

## 2017-04-06 ENCOUNTER — Ambulatory Visit (INDEPENDENT_AMBULATORY_CARE_PROVIDER_SITE_OTHER): Payer: Medicare HMO | Admitting: Family Medicine

## 2017-04-06 VITALS — BP 142/80 | HR 100 | Temp 98.3°F | Wt 232.0 lb

## 2017-04-06 DIAGNOSIS — Z96641 Presence of right artificial hip joint: Secondary | ICD-10-CM | POA: Diagnosis not present

## 2017-04-06 DIAGNOSIS — R000 Tachycardia, unspecified: Secondary | ICD-10-CM | POA: Diagnosis not present

## 2017-04-06 DIAGNOSIS — I959 Hypotension, unspecified: Secondary | ICD-10-CM | POA: Diagnosis not present

## 2017-04-06 NOTE — Assessment & Plan Note (Signed)
Ongoing tachycardia and fatigue/clamminess despite completing UTI treatment. He did finish xarelto course - doubt PE.  Mildly orthostatic today - anticipate ongoing mild dehydration leading to symptoms.  Continue current antihypertensives as no true hypotension. Consider increase in metoprolol.  No signs of fever or sepsis. No murmur on exam to suggest IE. Pt endorses recent hip replacement healing well, has f/u with ortho next week.

## 2017-04-06 NOTE — Progress Notes (Signed)
BP (!) 142/80 (BP Location: Right Arm, Cuff Size: Large)   Pulse 100   Temp 98.3 F (36.8 C) (Oral)   Wt 232 lb (105.2 kg)   BMI 31.46 kg/m    CC: check blood pressure Subjective:    Patient ID: Devin Huffman, male    DOB: 09-Apr-1946, 71 y.o.   MRN: [id number]  HPI: Devin Huffman is a 71 y.o. male presenting on 04/06/2017 for Hypotension   See prior note for details.  Ongoing malaise with fatigue and clammy episodes. Dizziness has improved.  No fevers with these clammy episodes.  Struggles to stay hydrated - but he has made conscious effort to increase fluid intake - up to 4-5 glasses of fluid a day.  He has been working well with PT at home s/p recent hip replacement. Feels tired after workout but no significant fatigue or dyspnea with this.   Off xarelto.   Denies fevers, leg swelling, coughing, chest pain, wound infection.    Relevant past medical, surgical, family and social history reviewed and updated as indicated. Interim medical history since our last visit reviewed. Allergies and medications reviewed and updated. Outpatient Medications Prior to Visit  Medication Sig Dispense Refill  . amLODipine (NORVASC) 5 MG tablet Take 1 tablet (5 mg total) by mouth daily. 90 tablet 1  . aspirin EC 81 MG tablet Take 1 tablet (81 mg total) by mouth daily.    . colchicine 0.6 MG tablet Take 1 tablet (0.6 mg total) by mouth daily as needed (gout).    . metoprolol succinate (TOPROL-XL) 25 MG 24 hr tablet Take 1 tablet (25 mg total) by mouth at bedtime. 90 tablet 1  . omeprazole (PRILOSEC) 20 MG capsule TAKE ONE CAPSULE BY MOUTH ONCE DAILY AS NEEDED (Patient taking differently: TAKE ONE CAPSULE BY MOUTH ONCE AT BEDTIME AS NEEDED FOR ACID REFLUX/HEARTBURN) 90 capsule 1   No facility-administered medications prior to visit.      Per HPI unless specifically indicated in ROS section below Review of Systems     Objective:    BP (!) 142/80 (BP Location: Right Arm, Cuff Size: Large)    Pulse 100   Temp 98.3 F (36.8 C) (Oral)   Wt 232 lb (105.2 kg)   BMI 31.46 kg/m   Wt Readings from Last 3 Encounters:  04/06/17 232 lb (105.2 kg)  04/03/17 230 lb 8 oz (104.6 kg)  03/26/17 235 lb 0.2 oz (106.6 kg)    Physical Exam  Constitutional: He appears well-developed and well-nourished. No distress.  HENT:  Mouth/Throat: Oropharynx is clear and moist. No oropharyngeal exudate.  Cardiovascular: Normal rate, regular rhythm, normal heart sounds and intact distal pulses.   No murmur heard. Pulmonary/Chest: Effort normal and breath sounds normal. No respiratory distress. He has no wheezes. He has no rales.  Musculoskeletal: He exhibits no edema.  Skin: Skin is warm and dry. No rash noted.  Nursing note and vitals reviewed.  Results for orders placed or performed in visit on 04/03/17  Urine culture  Result Value Ref Range   Organism ID, Bacteria NO GROWTH   POCT Urinalysis Dipstick (Automated)  Result Value Ref Range   Color, UA Yellow    Clarity, UA Clear    Glucose, UA Negative    Bilirubin, UA Negative    Ketones, UA Negative    Spec Grav, UA >=1.030 (A) 1.010 - 1.025   Blood, UA Negative    pH, UA 6.0 5.0 - 8.0   Protein,  UA Trace    Urobilinogen, UA 0.2 0.2 or 1.0 E.U./dL   Nitrite, UA Negative    Leukocytes, UA Moderate (2+) (A) Negative   EKG - sinus tachy rate 100, LAD with LAFB, normal intervals, no acute ST/T changes. Poor R wave transition.  Standing BP 120/76, sitting 142/80. HR remains 100s.   Lab Results  Component Value Date   TSH 0.91 08/31/2016      Assessment & Plan:   Problem List Items Addressed This Visit    History of right hip replacement   Tachycardia - Primary    Ongoing tachycardia and fatigue/clamminess despite completing UTI treatment. He did finish xarelto course - doubt PE.  Mildly orthostatic today - anticipate ongoing mild dehydration leading to symptoms.  Continue current antihypertensives as no true hypotension. Consider  increase in metoprolol.  No signs of fever or sepsis. No murmur on exam to suggest IE. Pt endorses recent hip replacement healing well, has f/u with ortho next week.       Relevant Orders   EKG 12-Lead (Completed)       Follow up plan: Return if symptoms worsen or fail to improve.  Ria Bush, MD

## 2017-04-06 NOTE — Progress Notes (Signed)
Pre visit review using our clinic review tool, if applicable. No additional management support is needed unless otherwise documented below in the visit note. 

## 2017-04-06 NOTE — Patient Instructions (Addendum)
EKG stable.  I think symptoms are still coming from mild dehydration.  Continue pushing fluids - goal 6 8oz glasses of fluid a day.

## 2017-04-10 DIAGNOSIS — Z7901 Long term (current) use of anticoagulants: Secondary | ICD-10-CM | POA: Diagnosis not present

## 2017-04-10 DIAGNOSIS — K219 Gastro-esophageal reflux disease without esophagitis: Secondary | ICD-10-CM | POA: Diagnosis not present

## 2017-04-10 DIAGNOSIS — Z471 Aftercare following joint replacement surgery: Secondary | ICD-10-CM | POA: Diagnosis not present

## 2017-04-10 DIAGNOSIS — I10 Essential (primary) hypertension: Secondary | ICD-10-CM | POA: Diagnosis not present

## 2017-04-10 DIAGNOSIS — Z96641 Presence of right artificial hip joint: Secondary | ICD-10-CM | POA: Diagnosis not present

## 2017-04-10 DIAGNOSIS — M109 Gout, unspecified: Secondary | ICD-10-CM | POA: Diagnosis not present

## 2017-04-13 DIAGNOSIS — I10 Essential (primary) hypertension: Secondary | ICD-10-CM | POA: Diagnosis not present

## 2017-04-13 DIAGNOSIS — Z96641 Presence of right artificial hip joint: Secondary | ICD-10-CM | POA: Diagnosis not present

## 2017-04-13 DIAGNOSIS — M109 Gout, unspecified: Secondary | ICD-10-CM | POA: Diagnosis not present

## 2017-04-13 DIAGNOSIS — K219 Gastro-esophageal reflux disease without esophagitis: Secondary | ICD-10-CM | POA: Diagnosis not present

## 2017-04-13 DIAGNOSIS — Z471 Aftercare following joint replacement surgery: Secondary | ICD-10-CM | POA: Diagnosis not present

## 2017-04-13 DIAGNOSIS — Z7901 Long term (current) use of anticoagulants: Secondary | ICD-10-CM | POA: Diagnosis not present

## 2017-04-16 DIAGNOSIS — Z471 Aftercare following joint replacement surgery: Secondary | ICD-10-CM | POA: Diagnosis not present

## 2017-04-16 DIAGNOSIS — Z96641 Presence of right artificial hip joint: Secondary | ICD-10-CM | POA: Diagnosis not present

## 2017-04-16 DIAGNOSIS — K219 Gastro-esophageal reflux disease without esophagitis: Secondary | ICD-10-CM | POA: Diagnosis not present

## 2017-04-16 DIAGNOSIS — I10 Essential (primary) hypertension: Secondary | ICD-10-CM | POA: Diagnosis not present

## 2017-04-16 DIAGNOSIS — Z7901 Long term (current) use of anticoagulants: Secondary | ICD-10-CM | POA: Diagnosis not present

## 2017-04-16 DIAGNOSIS — M109 Gout, unspecified: Secondary | ICD-10-CM | POA: Diagnosis not present

## 2017-04-19 DIAGNOSIS — Z471 Aftercare following joint replacement surgery: Secondary | ICD-10-CM | POA: Diagnosis not present

## 2017-04-19 DIAGNOSIS — Z7901 Long term (current) use of anticoagulants: Secondary | ICD-10-CM | POA: Diagnosis not present

## 2017-04-19 DIAGNOSIS — K219 Gastro-esophageal reflux disease without esophagitis: Secondary | ICD-10-CM | POA: Diagnosis not present

## 2017-04-19 DIAGNOSIS — M109 Gout, unspecified: Secondary | ICD-10-CM | POA: Diagnosis not present

## 2017-04-19 DIAGNOSIS — Z96641 Presence of right artificial hip joint: Secondary | ICD-10-CM | POA: Diagnosis not present

## 2017-04-19 DIAGNOSIS — I10 Essential (primary) hypertension: Secondary | ICD-10-CM | POA: Diagnosis not present

## 2017-05-03 ENCOUNTER — Telehealth: Payer: Self-pay

## 2017-05-03 NOTE — Telephone Encounter (Signed)
Patient is on the list for [employer] [facility] and may be a good candidate for an AWV. Please let me know if/when appt is scheduled.   

## 2017-05-04 NOTE — Telephone Encounter (Signed)
Left pt message asking to call Allison back directly at [phone] to schedule AWV.+ labs with Lesia and CPE with PCP. °

## 2017-05-18 NOTE — Telephone Encounter (Signed)
Scheduled 05/22/17

## 2017-05-21 ENCOUNTER — Other Ambulatory Visit: Payer: Self-pay | Admitting: Family Medicine

## 2017-05-21 DIAGNOSIS — E785 Hyperlipidemia, unspecified: Secondary | ICD-10-CM

## 2017-05-21 DIAGNOSIS — Z125 Encounter for screening for malignant neoplasm of prostate: Secondary | ICD-10-CM

## 2017-05-21 DIAGNOSIS — M1A0710 Idiopathic chronic gout, right ankle and foot, without tophus (tophi): Secondary | ICD-10-CM

## 2017-05-21 DIAGNOSIS — R7303 Prediabetes: Secondary | ICD-10-CM

## 2017-05-22 ENCOUNTER — Ambulatory Visit (INDEPENDENT_AMBULATORY_CARE_PROVIDER_SITE_OTHER): Payer: Medicare HMO

## 2017-05-22 ENCOUNTER — Encounter (INDEPENDENT_AMBULATORY_CARE_PROVIDER_SITE_OTHER): Payer: Self-pay

## 2017-05-22 VITALS — BP 130/78 | HR 80 | Temp 98.8°F | Ht 70.0 in | Wt 233.5 lb

## 2017-05-22 DIAGNOSIS — Z0000 Encounter for general adult medical examination without abnormal findings: Secondary | ICD-10-CM

## 2017-05-22 DIAGNOSIS — Z125 Encounter for screening for malignant neoplasm of prostate: Secondary | ICD-10-CM

## 2017-05-22 DIAGNOSIS — M1A0710 Idiopathic chronic gout, right ankle and foot, without tophus (tophi): Secondary | ICD-10-CM

## 2017-05-22 DIAGNOSIS — E785 Hyperlipidemia, unspecified: Secondary | ICD-10-CM | POA: Diagnosis not present

## 2017-05-22 LAB — COMPREHENSIVE METABOLIC PANEL
ALT: 15 U/L (ref 0–53)
AST: 12 U/L (ref 0–37)
Albumin: 4 g/dL (ref 3.5–5.2)
Alkaline Phosphatase: 124 U/L — ABNORMAL HIGH (ref 39–117)
BUN: 14 mg/dL (ref 6–23)
CALCIUM: 9.3 mg/dL (ref 8.4–10.5)
CHLORIDE: 107 meq/L (ref 96–112)
CO2: 26 mEq/L (ref 19–32)
CREATININE: 0.96 mg/dL (ref 0.40–1.50)
GFR: 81.7 mL/min (ref >60.00)
Glucose, Bld: 84 mg/dL (ref 70–99)
POTASSIUM: 4 meq/L (ref 3.5–5.1)
Sodium: 139 mEq/L (ref 135–145)
Total Bilirubin: 0.3 mg/dL (ref 0.2–1.2)
Total Protein: 6.8 g/dL (ref 6.0–8.3)

## 2017-05-22 LAB — LIPID PANEL
CHOLESTEROL: 216 mg/dL — AB (ref 0–200)
HDL: 35.1 mg/dL — AB (ref >39.00)
Total CHOL/HDL Ratio: 6

## 2017-05-22 LAB — LDL CHOLESTEROL, DIRECT: Direct LDL: 124 mg/dL

## 2017-05-22 LAB — URIC ACID: Uric Acid, Serum: 8.5 mg/dL — ABNORMAL HIGH (ref 4.0–7.8)

## 2017-05-22 LAB — PSA, MEDICARE: PSA: 3.04 ng/mL (ref 0.10–4.00)

## 2017-05-22 NOTE — Progress Notes (Signed)
Subjective:   Devin Huffman is a 71 y.o. male who presents for Medicare Annual/Subsequent preventive examination.  Review of Systems:  N/A Cardiac Risk Factors include: advanced age (>65men, >37 women);obesity (BMI >30kg/m2);male gender;hypertension;dyslipidemia     Objective:    Vitals: BP 130/78 (BP Location: Right Arm, Patient Position: Sitting, Cuff Size: Normal)   Pulse 80   Temp 98.8 F (37.1 C) (Oral)   Ht 5\' 10"  (1.778 m) Comment: no shoes  Wt 233 lb 8 oz (105.9 kg)   SpO2 97%   BMI 33.50 kg/m   Body mass index is 33.5 kg/m.  Tobacco History  Smoking Status  . Never Smoker  Smokeless Tobacco  . Never Used     Counseling given: No   Past Medical History:  Diagnosis Date  . 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 Cerumen impaction 2016   bilateral with otitis externa s/p ENT removal - [place]  . GERD (gastroesophageal reflux disease)    occasional  . Gout    ?due to L great toe pain, saw Dr Gladstone Lighter  . History of chicken pox   . Hyperlipidemia   . Hypertension   . Legg-Perthes disease    Right hip  . Nasal septal deviation    monitoring - [place]  . Prediabetes 03/22/2009   Past Surgical History:  Procedure Laterality Date  . COLONOSCOPY  03/2011   2 tubular adenomas, rec rpt 5 yrs  . HERNIA REPAIR     double hernia  . 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 TONSILLECTOMY    . TOTAL HIP ARTHROPLASTY Right 03/07/2017   Procedure: RIGHT TOTAL HIP ARTHROPLASTY ANTERIOR APPROACH;  Surgeon: Gaynelle Arabian, MD;  Location: [facility];  Service: Orthopedics;  Laterality: Right;  Marland Kitchen VASECTOMY    . WISDOM TOOTH EXTRACTION     Family History  Problem Relation Age of Onset  . Breast cancer Mother        with recurrence  . Alzheimer's disease Father   . Healthy Sister   . Cancer Maternal Aunt        ?  Marland Kitchen Hypertension  Maternal Uncle   . Stroke Maternal Uncle   . Coronary artery disease Paternal Uncle        MI  . Cancer Other        niece-2 types  . Stroke Maternal Grandfather    History  Sexual Activity  . Sexual activity: Yes    Outpatient Encounter Prescriptions as of 05/22/2017  Medication Sig  . amLODipine (NORVASC) 5 MG tablet Take 1 tablet (5 mg total) by mouth daily.  Marland Kitchen aspirin EC 81 MG tablet Take 1 tablet (81 mg total) by mouth daily.  . colchicine 0.6 MG tablet Take 1 tablet (0.6 mg total) by mouth daily as needed (gout).  . metoprolol succinate (TOPROL-XL) 25 MG 24 hr tablet Take 1 tablet (25 mg total) by mouth at bedtime.  Marland Kitchen omeprazole (PRILOSEC) 20 MG capsule TAKE ONE CAPSULE BY MOUTH ONCE DAILY AS NEEDED (Patient taking differently: TAKE ONE CAPSULE BY MOUTH ONCE AT BEDTIME AS NEEDED FOR ACID REFLUX/HEARTBURN)   No facility-administered encounter medications on file as of 05/22/2017.     Activities of Daily Living In your present state of health, do you have any difficulty performing the following activities: 05/22/2017 03/26/2017  Hearing? N N  Vision? N  N  Difficulty concentrating or making decisions? N N  Walking or climbing stairs? N Y  Dressing or bathing? N Y  Doing errands, shopping? N Y  Conservation officer, nature and eating ? N -  Using the Toilet? N -  In the past six months, have you accidently leaked urine? N -  Do you have problems with loss of bowel control? N -  Managing your Medications? N -  Managing your Finances? N -  Housekeeping or managing your Housekeeping? N -  Some recent data might be hidden    Patient Care Team: Ria Bush, MD as PCP - General (Family Medicine)   Assessment:     Hearing Screening   125Hz  250Hz  500Hz  1000Hz  2000Hz  3000Hz  4000Hz  6000Hz  8000Hz   Right ear:   40 40 40  40    Left ear:   40 40 40  40    Vision Screening Comments: Last vision exam in 2017   Exercise Activities and Dietary recommendations Current Exercise Habits: The  patient does not participate in regular exercise at present, Exercise limited by: orthopedic condition(s)  Goals    . healthy food choices          Starting 05/22/2017, I will continue to eat 3 desserts per week.       Fall Risk Fall Risk  05/22/2017 11/17/2015 11/11/2014 11/04/2013 11/04/2013  Falls in the past year? No No No No No   Depression Screen PHQ 2/9 Scores 05/22/2017 11/17/2015 11/11/2014 11/04/2013  PHQ - 2 Score 0 0 0 0    Cognitive Function MMSE - Mini Mental State Exam 05/22/2017  Orientation to time 5  Orientation to Place 5  Registration 3  Attention/ Calculation 0  Recall 3  Language- name 2 objects 0  Language- repeat 1  Language- follow 3 step command 3  Language- read & follow direction 0  Write a sentence 0  Copy design 0  Total score 20     PLEASE NOTE: A Mini-Cog screen was completed. Maximum score is 20. A value of 0 denotes this part of Folstein MMSE was not completed or the patient failed this part of the Mini-Cog screening.   Mini-Cog Screening Orientation to Time - Max 5 pts Orientation to Place - Max 5 pts Registration - Max 3 pts Recall - Max 3 pts Language Repeat - Max 1 pts Language Follow 3 Step Command - Max 3 pts     Immunization History  Administered Date(s) Administered  . Influenza Split 10/15/2012  . Influenza,inj,Quad PF,36+ Mos 11/11/2014, 11/17/2015, 08/31/2016  . Pneumococcal Conjugate-13 11/11/2014  . Pneumococcal Polysaccharide-23 10/15/2012  . Td 12/12/1996, 03/22/2009  . Zoster 10/13/2013   Screening Tests Health Maintenance  Topic Date Due  . FOOT EXAM  05/28/2017 (Originally 05/13/2016)  . OPHTHALMOLOGY EXAM  05/22/2018 (Originally 04/19/2016)  . COLONOSCOPY  04/05/2021 (Originally 04/06/2016)  . INFLUENZA VACCINE  07/11/2017  . URINE MICROALBUMIN  08/30/2017  . HEMOGLOBIN A1C  08/31/2017  . TETANUS/TDAP  03/23/2019  . Hepatitis C Screening  Completed  . PNA vac Low Risk Adult  Completed  . DTaP/Tdap/Td  Excluded        Plan:     I have personally reviewed and addressed the Medicare Annual Wellness questionnaire and have noted the following in the patient's chart:  A. Medical and social history B. Use of alcohol, tobacco or illicit drugs  C. Current medications and supplements D. Functional ability and status E.  Nutritional status F.  Physical activity G. Advance  directives H. List of other physicians I.  Hospitalizations, surgeries, and ER visits in previous 12 months J.  [place] to include hearing, vision, cognitive, depression L. Referrals and appointments - none  In addition, I have reviewed and discussed with patient certain preventive protocols, quality metrics, and best practice recommendations. A written personalized care plan for preventive services as well as general preventive health recommendations were provided to patient.  See attached scanned questionnaire for additional information.   Signed,   Lindell Noe, MHA, BS, LPN Health Coach

## 2017-05-22 NOTE — Patient Instructions (Addendum)
Mr. Bembenek , Thank you for taking time to come for your Medicare Wellness Visit. I appreciate your ongoing commitment to your health goals. Please review the following plan we discussed and let me know if I can assist you in the future.   These are the goals we discussed: Goals    . healthy food choices          Starting 05/22/2017, I will continue to eat 3 desserts per week.        This is a list of the screening recommended for you and due dates:  Health Maintenance  Topic Date Due  . Complete foot exam   05/28/2017*  . Eye exam for diabetics  05/22/2018*  . Colon Cancer Screening  04/05/2021*  . Flu Shot  07/11/2017  . Urine Protein Check  08/30/2017  . Hemoglobin A1C  08/31/2017  . Tetanus Vaccine  03/23/2019  .  Hepatitis C: One time screening is recommended by Center for Disease Control  (CDC) for  adults born from [birth year] through 1967.   Completed  . Pneumonia vaccines  Completed  . DTaP/Tdap/Td vaccine  Excluded  *Topic was postponed. The date shown is not the original due date.      Preventive Care for Adults  A healthy lifestyle and preventive care can promote health and wellness. Preventive health guidelines for adults include the following key practices.  . A routine yearly physical is a good way to check with your health care provider about your health and preventive screening. It is a chance to share any concerns and updates on your health and to receive a thorough exam.  . Visit your dentist for a routine exam and preventive care every 6 months. Brush your teeth twice a day and floss once a day. Good oral hygiene prevents tooth decay and gum disease.  . The frequency of eye exams is based on your age, health, family medical history, use  of contact lenses, and other factors. Follow your health care provider's ecommendations for frequency of eye exams.  . Eat a healthy diet. Foods like vegetables, fruits, whole grains, low-fat dairy products, and lean protein  foods contain the nutrients you need without too many calories. Decrease your intake of foods high in solid fats, added sugars, and salt. Eat the right amount of calories for you. Get information about a proper diet from your health care provider, if necessary.  . Regular physical exercise is one of the most important things you can do for your health. Most adults should get at least 150 minutes of moderate-intensity exercise (any activity that increases your heart rate and causes you to sweat) each week. In addition, most adults need muscle-strengthening exercises on 2 or more days a week.  Silver Sneakers may be a benefit available to you. To determine eligibility, you may visit the website: www.silversneakers.com or contact program at [phone] Mon-Fri between 8AM-8PM.   . Maintain a healthy weight. The body mass index (BMI) is a screening tool to identify possible weight problems. It provides an estimate of body fat based on height and weight. Your health care provider can find your BMI and can help you achieve or maintain a healthy weight.   For adults 20 years and older: ? A BMI below 18.5 is considered underweight. ? A BMI of 18.5 to 24.9 is normal. ? A BMI of 25 to 29.9 is considered overweight. ? A BMI of 30 and above is considered obese.   . Maintain normal blood lipids  and cholesterol levels by exercising and minimizing your intake of saturated fat. Eat a balanced diet with plenty of fruit and vegetables. Blood tests for lipids and cholesterol should begin at age 70 and be repeated every 5 years. If your lipid or cholesterol levels are high, you are over 50, or you are at high risk for heart disease, you may need your cholesterol levels checked more frequently. Ongoing high lipid and cholesterol levels should be treated with medicines if diet and exercise are not working.  . If you smoke, find out from your health care provider how to quit. If you do not use tobacco, please do not  start.  . If you choose to drink alcohol, please do not consume more than 2 drinks per day. One drink is considered to be 12 ounces (355 mL) of beer, 5 ounces (148 mL) of wine, or 1.5 ounces (44 mL) of liquor.  . If you are 47-5 years old, ask your health care provider if you should take aspirin to prevent strokes.  . Use sunscreen. Apply sunscreen liberally and repeatedly throughout the day. You should seek shade when your shadow is shorter than you. Protect yourself by wearing long sleeves, pants, a wide-brimmed hat, and sunglasses year round, whenever you are outdoors.  . Once a month, do a whole body skin exam, using a mirror to look at the skin on your back. Tell your health care provider of new moles, moles that have irregular borders, moles that are larger than a pencil eraser, or moles that have changed in shape or color.

## 2017-05-22 NOTE — Progress Notes (Signed)
Pre visit review using our clinic review tool, if applicable. No additional management support is needed unless otherwise documented below in the visit note. 

## 2017-05-22 NOTE — Progress Notes (Signed)
PCP notes:   Health maintenance:  Foot exam - PCP please address at next appt Colon cancer screening - PCP please address at next appt  Abnormal screenings:   None  Patient concerns:   Swollen, painful left foot - approx. 1 wk ago, pt states foot became swollen and painful at times. Pt denies trauma to foot. Pt denies pain in femoral area or that left leg appears larger than right leg. Pt states foot does not feel cold but feels like hot coals under bottom of foot at times. Pt reports future appt with podiatrist on 05/24/17. Pt states gout medication has been ineffective.   Urinary hesitancy - pt reports increased hesitancy in urination with intermittent dribbling.   Nurse concerns:  None  Next PCP appt:   05/28/17 @ 1400

## 2017-05-23 NOTE — Progress Notes (Signed)
I reviewed health advisor's note, was available for consultation, and agree with documentation and plan.  

## 2017-05-24 DIAGNOSIS — G609 Hereditary and idiopathic neuropathy, unspecified: Secondary | ICD-10-CM | POA: Diagnosis not present

## 2017-05-24 DIAGNOSIS — M79674 Pain in right toe(s): Secondary | ICD-10-CM | POA: Diagnosis not present

## 2017-05-24 DIAGNOSIS — M79672 Pain in left foot: Secondary | ICD-10-CM | POA: Diagnosis not present

## 2017-05-24 DIAGNOSIS — M79671 Pain in right foot: Secondary | ICD-10-CM | POA: Diagnosis not present

## 2017-05-28 ENCOUNTER — Ambulatory Visit (INDEPENDENT_AMBULATORY_CARE_PROVIDER_SITE_OTHER): Payer: Medicare HMO | Admitting: Family Medicine

## 2017-05-28 ENCOUNTER — Encounter: Payer: Self-pay | Admitting: Family Medicine

## 2017-05-28 VITALS — BP 120/78 | HR 69 | Temp 98.6°F | Ht 70.0 in | Wt 234.8 lb

## 2017-05-28 DIAGNOSIS — Z96641 Presence of right artificial hip joint: Secondary | ICD-10-CM | POA: Diagnosis not present

## 2017-05-28 DIAGNOSIS — E785 Hyperlipidemia, unspecified: Secondary | ICD-10-CM

## 2017-05-28 DIAGNOSIS — R7303 Prediabetes: Secondary | ICD-10-CM | POA: Diagnosis not present

## 2017-05-28 DIAGNOSIS — E669 Obesity, unspecified: Secondary | ICD-10-CM

## 2017-05-28 DIAGNOSIS — M79671 Pain in right foot: Secondary | ICD-10-CM

## 2017-05-28 DIAGNOSIS — M1A0710 Idiopathic chronic gout, right ankle and foot, without tophus (tophi): Secondary | ICD-10-CM | POA: Diagnosis not present

## 2017-05-28 DIAGNOSIS — I10 Essential (primary) hypertension: Secondary | ICD-10-CM | POA: Diagnosis not present

## 2017-05-28 DIAGNOSIS — M79672 Pain in left foot: Secondary | ICD-10-CM

## 2017-05-28 DIAGNOSIS — Z0000 Encounter for general adult medical examination without abnormal findings: Secondary | ICD-10-CM | POA: Diagnosis not present

## 2017-05-28 DIAGNOSIS — Z0001 Encounter for general adult medical examination with abnormal findings: Secondary | ICD-10-CM | POA: Insufficient documentation

## 2017-05-28 DIAGNOSIS — R972 Elevated prostate specific antigen [PSA]: Secondary | ICD-10-CM | POA: Diagnosis not present

## 2017-05-28 DIAGNOSIS — R21 Rash and other nonspecific skin eruption: Secondary | ICD-10-CM | POA: Diagnosis not present

## 2017-05-28 NOTE — Assessment & Plan Note (Signed)
Chronic, stable. Continue current regimen. 

## 2017-05-28 NOTE — Assessment & Plan Note (Signed)
Urate too high. Provided with low purine diet handout. Consider allopurinol. Await full ortho eval for foot pains

## 2017-05-28 NOTE — Assessment & Plan Note (Signed)
Left forearm - anticipate tinea corporis- rec lotrimin bid x 2 wks.

## 2017-05-28 NOTE — Assessment & Plan Note (Addendum)
Chronic. Pt states lipitor caused myalgias. Advised he restart fish oil daily. Encouraged he consider trial of another statin. Will start with monitoring fish oil effect. The 10-year ASCVD risk score Mikey Bussing DC Brooke Bonito., et al., 2013) is: 42.9%   Values used to calculate the score:     Age: 71 years     Sex: Male     Is Non-Hispanic African American: No     Diabetic: Yes     Tobacco smoker: No     Systolic Blood Pressure: 007 mmHg     Is BP treated: Yes     HDL Cholesterol: 35.1 mg/dL     Total Cholesterol: 216 mg/dL

## 2017-05-28 NOTE — Assessment & Plan Note (Signed)
Preventative protocols reviewed and updated unless pt declined. Discussed healthy diet and lifestyle.  

## 2017-05-28 NOTE — Assessment & Plan Note (Addendum)
Discussed diet changes to maintain good control. Too soon for A1c.

## 2017-05-28 NOTE — Assessment & Plan Note (Signed)
Has recovered well from surgery.

## 2017-05-28 NOTE — Patient Instructions (Addendum)
Once feeling better, call Dr Fuller Plan for follow up colonoscopy.  For forearm rash - use lotrimin (clotrimazole) twice daily for 2 weeks. Let me know if not better.  Triglyceride levels were too high. Restart fish oil 2000gm daily.  Good to see you today, call us with questions. Let me know how foot does. Return in 6 months for follow up visit and labs again.   Health Maintenance, Male A healthy lifestyle and preventive care is important for your health and wellness. Ask your health care provider about what schedule of regular examinations is right for you. What should I know about weight and diet? Eat a Healthy Diet  Eat plenty of vegetables, fruits, whole grains, low-fat dairy products, and lean protein.  Do not eat a lot of foods high in solid fats, added sugars, or salt.  Maintain a Healthy Weight Regular exercise can help you achieve or maintain a healthy weight. You should:  Do at least 150 minutes of exercise each week. The exercise should increase your heart rate and make you sweat (moderate-intensity exercise).  Do strength-training exercises at least twice a week.  Watch Your Levels of Cholesterol and Blood Lipids  Have your blood tested for lipids and cholesterol every 5 years starting at 73 years of age. If you are at high risk for heart disease, you should start having your blood tested when you are 73 years old. You may need to have your cholesterol levels checked more often if: ? Your lipid or cholesterol levels are high. ? You are older than 73 years of age. ? You are at high risk for heart disease.  What should I know about cancer screening? Many types of cancers can be detected early and may often be prevented. Lung Cancer  You should be screened every year for lung cancer if: ? You are a current smoker who has smoked for at least 30 years. ? You are a former smoker who has quit within the past 15 years.  Talk to your health care provider about your screening  options, when you should start screening, and how often you should be screened.  Colorectal Cancer  Routine colorectal cancer screening usually begins at 73 years of age and should be repeated every 5-10 years until you are 73 years old. You may need to be screened more often if early forms of precancerous polyps or small growths are found. Your health care provider may recommend screening at an earlier age if you have risk factors for colon cancer.  Your health care provider may recommend using home test kits to check for hidden blood in the stool.  A small camera at the end of a tube can be used to examine your colon (sigmoidoscopy or colonoscopy). This checks for the earliest forms of colorectal cancer.  Prostate and Testicular Cancer  Depending on your age and overall health, your health care provider may do certain tests to screen for prostate and testicular cancer.  Talk to your health care provider about any symptoms or concerns you have about testicular or prostate cancer.  Skin Cancer  Check your skin from head to toe regularly.  Tell your health care provider about any new moles or changes in moles, especially if: ? There is a change in a mole's size, shape, or color. ? You have a mole that is larger than a pencil eraser.  Always use sunscreen. Apply sunscreen liberally and repeat throughout the day.  Protect yourself by wearing long sleeves, pants, a wide-brimmed  hat, and sunglasses when outside.  What should I know about heart disease, diabetes, and high blood pressure?  If you are 53-69 years of age, have your blood pressure checked every 3-5 years. If you are 24 years of age or older, have your blood pressure checked every year. You should have your blood pressure measured twice-once when you are at a hospital or clinic, and once when you are not at a hospital or clinic. Record the average of the two measurements. To check your blood pressure when you are not at a hospital  or clinic, you can use: ? An automated blood pressure machine at a pharmacy. ? A home blood pressure monitor.  Talk to your health care provider about your target blood pressure.  If you are between 22-65 years old, ask your health care provider if you should take aspirin to prevent heart disease.  Have regular diabetes screenings by checking your fasting blood sugar level. ? If you are at a normal weight and have a low risk for diabetes, have this test once every three years after the age of 35. ? If you are overweight and have a high risk for diabetes, consider being tested at a younger age or more often.  A one-time screening for abdominal aortic aneurysm (AAA) by ultrasound is recommended for men aged 53-75 years who are current or former smokers. What should I know about preventing infection? Hepatitis B If you have a higher risk for hepatitis B, you should be screened for this virus. Talk with your health care provider to find out if you are at risk for hepatitis B infection. Hepatitis C Blood testing is recommended for:  Everyone born from [birth year] through 1967.  Anyone with known risk factors for hepatitis C.  Sexually Transmitted Diseases (STDs)  You should be screened each year for STDs including gonorrhea and chlamydia if: ? You are sexually active and are younger than 73 years of age. ? You are older than 74 years of age and your health care provider tells you that you are at risk for this type of infection. ? Your sexual activity has changed since you were last screened and you are at an increased risk for chlamydia or gonorrhea. Ask your health care provider if you are at risk.  Talk with your health care provider about whether you are at high risk of being infected with HIV. Your health care provider may recommend a prescription medicine to help prevent HIV infection.  What else can I do?  Schedule regular health, dental, and eye exams.  Stay current with your vaccines  (immunizations).  Do not use any tobacco products, such as cigarettes, chewing tobacco, and e-cigarettes. If you need help quitting, ask your health care provider.  Limit alcohol intake to no more than 2 drinks per day. One drink equals 12 ounces of beer, 5 ounces of wine, or 1 ounces of hard liquor.  Do not use street drugs.  Do not share needles.  Ask your health care provider for help if you need support or information about quitting drugs.  Tell your health care provider if you often feel depressed.  Tell your health care provider if you have ever been abused or do not feel safe at home. This information is not intended to replace advice given to you by your health care provider. Make sure you discuss any questions you have with your health care provider. Document Released: 05/25/2008 Document Revised: 07/26/2016 Document Reviewed: 08/31/2015 Elsevier Interactive Patient Education  2018 [place] are compounds that affect the level of uric acid in your body. A low-purine diet is a diet that is low in purines. Eating a low-purine diet can prevent the level of uric acid in your body from getting too high and causing gout or kidney stones or both. What do I need to know about this diet?  Choose low-purine foods. Examples of low-purine foods are listed in the next section.  Drink plenty of fluids, especially water. Fluids can help remove uric acid from your body. Try to drink 8-16 cups (1.9-3.8 L) a day.  Limit foods high in fat, especially saturated fat, as fat makes it harder for the body to get rid of uric acid. Foods high in saturated fat include pizza, cheese, ice cream, whole milk, fried foods, and gravies. Choose foods that are lower in fat and lean sources of protein. Use olive oil when cooking as it contains healthy fats that are not high in saturated fat.  Limit alcohol. Alcohol interferes with the elimination of uric acid from your body. If you  are having a gout attack, avoid all alcohol.  Keep in mind that different people's bodies react differently to different foods. You will probably learn over time which foods do or do not affect you. If you discover that a food tends to cause your gout to flare up, avoid eating that food. You can more freely enjoy foods that do not cause problems. If you have any questions about a food item, talk to your dietitian or health care provider. Which foods are low, moderate, and high in purines? The following is a list of foods that are low, moderate, and high in purines. You can eat any amount of the foods that are low in purines. You may be able to have small amounts of foods that are moderate in purines. Ask your health care provider how much of a food moderate in purines you can have. Avoid foods high in purines. Grains  Foods low in purines: Enriched white bread, pasta, rice, cake, cornbread, popcorn.  Foods moderate in purines: Whole-grain breads and cereals, wheat germ, bran, oatmeal. Uncooked oatmeal. Dry wheat bran or wheat germ.  Foods high in purines: Pancakes, [place] toast, biscuits, muffins. Vegetables  Foods low in purines: All vegetables, except those that are moderate in purines.  Foods moderate in purines: Asparagus, cauliflower, spinach, mushrooms, green peas. Fruits  All fruits are low in purines. Meats and other Protein Foods  Foods low in purines: Eggs, nuts, peanut butter.  Foods moderate in purines: 80-90% lean beef, lamb, veal, pork, poultry, fish, eggs, peanut butter, nuts. Crab, lobster, oysters, and shrimp. Cooked dried beans, peas, and lentils.  Foods high in purines: Anchovies, sardines, herring, mussels, tuna, codfish, scallops, trout, and haddock. Berniece Salines. Organ meats (such as liver or kidney). Tripe. Game meat. Goose. Sweetbreads. Dairy  All dairy foods are low in purines. Low-fat and fat-free dairy products are best because they are low in saturated  fat. Beverages  Drinks low in purines: Water, carbonated beverages, tea, coffee, cocoa.  Drinks moderate in purines: Soft drinks and other drinks sweetened with high-fructose corn syrup. Juices. To find whether a food or drink is sweetened with high-fructose corn syrup, look at the ingredients list.  Drinks high in purines: Alcoholic beverages (such as beer). Condiments  Foods low in purines: Salt, herbs, olives, pickles, relishes, vinegar.  Foods moderate in purines: Butter, margarine, oils, mayonnaise. Fats and Oils  Foods low in  purines: All types, except gravies and sauces made with meat.  Foods high in purines: Gravies and sauces made with meat. Other Foods  Foods low in purines: Sugars, sweets, gelatin. Cake. Soups made without meat.  Foods moderate in purines: Meat-based or fish-based soups, broths, or bouillons. Foods and drinks sweetened with high-fructose corn syrup.  Foods high in purines: High-fat desserts (such as ice cream, cookies, cakes, pies, doughnuts, and chocolate). Contact your dietitian for more information on foods that are not listed here. This information is not intended to replace advice given to you by your health care provider. Make sure you discuss any questions you have with your health care provider. Document Released: 03/24/2011 Document Revised: 05/04/2016 Document Reviewed: 11/03/2013 Elsevier Interactive Patient Education  2017 [employer].

## 2017-05-28 NOTE — Progress Notes (Signed)
BP 120/78   Pulse 69   Temp 98.6 F (37 C)   Ht 5\' 10"  (1.778 m)   Wt 234 lb 12 oz (106.5 kg)   SpO2 97%   BMI 33.68 kg/m    CC: CPE Subjective:    Patient ID: Devin Huffman, male    DOB: Apr 15, 1946, 71 y.o.   MRN: [id number]  HPI: Devin Huffman is a 71 y.o. male presenting on 05/28/2017 for Medicare Wellness (Part 2)   Saw Katha Cabal last week for medicare wellness visit. Note reviewed.   S/p recent R hip replacement for primary osteoarthritis. Shortly thereafter admitted with urosepsis.   Ongoing L>R foot swelling and swollen dark R 2nd toe, gout meds were not helpful. Saw podiatrist at [facility] last week - concern for foot infection, pending MRI this week. Started keflex 500mg  TID. Ongoing foot paresthesias.   Increased hesitancy with urination with increased dribbling  Rash left forearm that seems to be spreading. Somewhat itchy.   HLD - did not tolerate prior statin trial   Preventative: Colon cancer screening - Dr. Fuller Plan. 03/2011; 2 polyps - tubular adenoma, rpt 5 yrs. No family hx colon CA.  Prostate cancer screening - discussed. PSA yearly, DRE every few years. Mild nocturia, strong stream.  Flu shot today Tetanus 2010 Pneumovax 2013, prevnar 2015 zostavax 10/13/2013. Shingrix - discussed, not currently interested Advanced directives: does not want prolonged life support. Would want for reversible cause. Wife would be HCPOA. Packet provided today. Seat belt use discussed Sunscreen use discussed. No changing moles on skin. 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 No regular exercise.  Diet: "I love my ice cream. I can't live forever", some water   Relevant past medical, surgical, family and social history reviewed and updated as indicated. Interim medical history since our last visit  reviewed. Allergies and medications reviewed and updated. Outpatient Medications Prior to Visit  Medication Sig Dispense Refill  . amLODipine (NORVASC) 5 MG tablet Take 1 tablet (5 mg total) by mouth daily. 90 tablet 1  . aspirin EC 81 MG tablet Take 1 tablet (81 mg total) by mouth daily.    . colchicine 0.6 MG tablet Take 1 tablet (0.6 mg total) by mouth daily as needed (gout).    . metoprolol succinate (TOPROL-XL) 25 MG 24 hr tablet Take 1 tablet (25 mg total) by mouth at bedtime. 90 tablet 1  . omeprazole (PRILOSEC) 20 MG capsule TAKE ONE CAPSULE BY MOUTH ONCE DAILY AS NEEDED (Patient not taking: Reported on 05/28/2017) 90 capsule 1   No facility-administered medications prior to visit.      Per HPI unless specifically indicated in ROS section below Review of Systems  Constitutional: Negative for activity change, appetite change, chills, fatigue, fever and unexpected weight change.  HENT: Negative for hearing loss.   Eyes: Negative for visual disturbance.  Respiratory: Negative for cough, chest tightness, shortness of breath and wheezing.   Cardiovascular: Negative for chest pain, palpitations and leg swelling (left foot).  Gastrointestinal: Negative for abdominal distention, abdominal pain, blood in stool, constipation, diarrhea, nausea and vomiting.  Genitourinary: Negative for difficulty urinating and hematuria.  Musculoskeletal: Negative for arthralgias, myalgias and neck pain.  Skin: Negative for rash.  Neurological: Negative for dizziness, seizures, syncope and headaches.  Hematological: Negative for adenopathy. Does not bruise/bleed easily.  Psychiatric/Behavioral: Negative  for dysphoric mood. The patient is not nervous/anxious.        Objective:    BP 120/78   Pulse 69   Temp 98.6 F (37 C)   Ht 5\' 10"  (1.778 m)   Wt 234 lb 12 oz (106.5 kg)   SpO2 97%   BMI 33.68 kg/m   Wt Readings from Last 3 Encounters:  05/28/17 234 lb 12 oz (106.5 kg)  05/22/17 233 lb 8 oz  (105.9 kg)  04/06/17 232 lb (105.2 kg)    Physical Exam  Constitutional: He is oriented to person, place, and time. He appears well-developed and well-nourished. No distress.  HENT:  Head: Normocephalic and atraumatic.  Right Ear: Hearing, tympanic membrane, external ear and ear canal normal.  Left Ear: Hearing, tympanic membrane, external ear and ear canal normal.  Nose: Nose normal.  Mouth/Throat: Uvula is midline, oropharynx is clear and moist and mucous membranes are normal. No oropharyngeal exudate, posterior oropharyngeal edema or posterior oropharyngeal erythema.  Eyes: Conjunctivae and EOM are normal. Pupils are equal, round, and reactive to light. No scleral icterus.  Neck: Normal range of motion. Neck supple.  Cardiovascular: Normal rate, regular rhythm, normal heart sounds and intact distal pulses.   No murmur heard. Pulses:      Radial pulses are 2+ on the right side, and 2+ on the left side.  Pulmonary/Chest: Effort normal and breath sounds normal. No respiratory distress. He has no wheezes. He has no rales.  Abdominal: Soft. Bowel sounds are normal. He exhibits no distension and no mass. There is no tenderness. There is no rebound and no guarding.  Genitourinary: Rectum normal. Rectal exam shows no external hemorrhoid, no internal hemorrhoid, no fissure, no mass, no tenderness and anal tone normal. Prostate is enlarged (30gm). Prostate is not tender.  Genitourinary Comments: ?R lobe nodule  Musculoskeletal: Normal range of motion. He exhibits no edema.  Lymphadenopathy:    He has no cervical adenopathy.  Neurological: He is alert and oriented to person, place, and time.  CN grossly intact, station and gait intact  Skin: Skin is warm and dry. Rash noted.  Circular scaly rash left forearm with central clearing  Psychiatric: He has a normal mood and affect. His behavior is normal. Judgment and thought content normal.  Nursing note and vitals reviewed.  Results for orders  placed or performed in visit on 05/22/17  Lipid panel  Result Value Ref Range   Cholesterol 216 (H) 0 - 200 mg/dL   Triglycerides (H) 0.0 - 149.0 mg/dL    459.0 Triglyceride is over 400; calculations on Lipids are invalid.   HDL 35.10 (L) >39.00 mg/dL   Total CHOL/HDL Ratio 6   Comprehensive metabolic panel  Result Value Ref Range   Sodium 139 135 - 145 mEq/L   Potassium 4.0 3.5 - 5.1 mEq/L   Chloride 107 96 - 112 mEq/L   CO2 26 19 - 32 mEq/L   Glucose, Bld 84 70 - 99 mg/dL   BUN 14 6 - 23 mg/dL   Creatinine, Ser 0.96 0.40 - 1.50 mg/dL   Total Bilirubin 0.3 0.2 - 1.2 mg/dL   Alkaline Phosphatase 124 (H) 39 - 117 U/L   AST 12 0 - 37 U/L   ALT 15 0 - 53 U/L   Total Protein 6.8 6.0 - 8.3 g/dL   Albumin 4.0 3.5 - 5.2 g/dL   Calcium 9.3 8.4 - 10.5 mg/dL   GFR 81.70 >60.00 mL/min  PSA, Medicare  Result Value  Ref Range   PSA 3.04 0.10 - 4.00 ng/ml  Uric acid  Result Value Ref Range   Uric Acid, Serum 8.5 (H) 4.0 - 7.8 mg/dL  LDL cholesterol, direct  Result Value Ref Range   Direct LDL 124.0 mg/dL      Assessment & Plan:   Problem List Items Addressed This Visit    Dyslipidemia    Chronic. Pt states lipitor caused myalgias. Advised he restart fish oil daily. Encouraged he consider trial of another statin. Will start with monitoring fish oil effect. The 10-year ASCVD risk score Mikey Bussing DC Brooke Bonito., et al., 2013) is: 42.9%   Values used to calculate the score:     Age: 48 years     Sex: Male     Is Non-Hispanic African American: No     Diabetic: Yes     Tobacco smoker: No     Systolic Blood Pressure: 462 mmHg     Is BP treated: Yes     HDL Cholesterol: 35.1 mg/dL     Total Cholesterol: 216 mg/dL       Essential hypertension    Chronic, stable. Continue current regimen.       Foot pain, bilateral    Bilateral foot pain with L>R swelling and R 2nd toe edema, hyperpigmentation of unknown cause. He has established with [place] [facility] podiatrist [facility]) pending MRI and further  evaluation this week. Currently treating possible infection with keflex TID course.      Gout    Urate too high. Provided with low purine diet handout. Consider allopurinol. Await full ortho eval for foot pains       Health maintenance examination - Primary    Preventative protocols reviewed and updated unless pt declined. Discussed healthy diet and lifestyle.       History of right hip replacement    Has recovered well from surgery.      Increased prostate specific antigen (PSA) velocity    rec return 6 mo recheck labs.       Obesity, Class I, BMI 30-34.9    Discussed healthy diet and lifestyle changes to affect sustainable weight loss.       Prediabetes    Discussed diet changes to maintain good control. Too soon for A1c.       Skin rash    Left forearm - anticipate tinea corporis- rec lotrimin bid x 2 wks.           Follow up plan: Return in about 6 months (around 11/27/2017) for follow up visit.  Ria Bush, MD

## 2017-05-28 NOTE — Assessment & Plan Note (Signed)
rec return 6 mo recheck labs.

## 2017-05-28 NOTE — Assessment & Plan Note (Signed)
Bilateral foot pain with L>R swelling and R 2nd toe edema, hyperpigmentation of unknown cause. He has established with [place] [facility] podiatrist [facility]) pending MRI and further evaluation this week. Currently treating possible infection with keflex TID course.

## 2017-05-28 NOTE — Assessment & Plan Note (Signed)
Discussed healthy diet and lifestyle changes to affect sustainable weight loss  

## 2017-05-31 DIAGNOSIS — M7989 Other specified soft tissue disorders: Secondary | ICD-10-CM | POA: Diagnosis not present

## 2017-05-31 DIAGNOSIS — M79674 Pain in right toe(s): Secondary | ICD-10-CM | POA: Diagnosis not present

## 2017-06-06 ENCOUNTER — Telehealth: Payer: Self-pay

## 2017-06-06 NOTE — Telephone Encounter (Signed)
Pt is on the [employer] list for 2018.   HM for colonoscopy was postponed. This has been removed. Letter from Dr. Fuller Plan written in 2012 indicates that pt is due for colonoscopy in five year. Pt is due - metric is not met.   HM for ophthalmology exam was also postponed. This has been removed. Notes containing eye exam are not in EMR. Last exam available was in 2016. Pt is due - metric is not met.

## 2017-06-20 DIAGNOSIS — G608 Other hereditary and idiopathic neuropathies: Secondary | ICD-10-CM | POA: Diagnosis not present

## 2017-06-20 DIAGNOSIS — M19071 Primary osteoarthritis, right ankle and foot: Secondary | ICD-10-CM | POA: Diagnosis not present

## 2017-08-02 DIAGNOSIS — D0462 Carcinoma in situ of skin of left upper limb, including shoulder: Secondary | ICD-10-CM | POA: Diagnosis not present

## 2017-08-02 DIAGNOSIS — Z85828 Personal history of other malignant neoplasm of skin: Secondary | ICD-10-CM | POA: Diagnosis not present

## 2017-08-02 DIAGNOSIS — D485 Neoplasm of uncertain behavior of skin: Secondary | ICD-10-CM | POA: Diagnosis not present

## 2017-08-02 DIAGNOSIS — L821 Other seborrheic keratosis: Secondary | ICD-10-CM | POA: Diagnosis not present

## 2017-08-23 DIAGNOSIS — D0462 Carcinoma in situ of skin of left upper limb, including shoulder: Secondary | ICD-10-CM | POA: Diagnosis not present

## 2017-10-17 ENCOUNTER — Other Ambulatory Visit: Payer: Self-pay | Admitting: Family Medicine

## 2017-10-25 DIAGNOSIS — R69 Illness, unspecified: Secondary | ICD-10-CM | POA: Diagnosis not present

## 2017-11-12 ENCOUNTER — Other Ambulatory Visit: Payer: Self-pay | Admitting: Family Medicine

## 2017-11-12 NOTE — Telephone Encounter (Signed)
Indomethacin refilled with 10 pills until he sees Dr. In a couple of weeks.

## 2017-11-12 NOTE — Telephone Encounter (Signed)
Left message on vm for pt to call back. Need to confirm pt is still taking indomethacin. Looks like rx was discontinued 02/2017 after hospital d/c.

## 2017-11-16 ENCOUNTER — Telehealth: Payer: Self-pay

## 2017-11-16 NOTE — Telephone Encounter (Signed)
Started PA for Indomethacin 50 mg cap, key:  [id number]. Decision pending.

## 2017-11-21 NOTE — Telephone Encounter (Signed)
Received faxed PA approval effective 12/09/2016 until 12/10/2018.   Notified [facility] they will get it filled for pt.

## 2017-11-25 ENCOUNTER — Other Ambulatory Visit: Payer: Self-pay | Admitting: Family Medicine

## 2017-11-25 DIAGNOSIS — R7303 Prediabetes: Secondary | ICD-10-CM

## 2017-11-25 DIAGNOSIS — M1A0710 Idiopathic chronic gout, right ankle and foot, without tophus (tophi): Secondary | ICD-10-CM

## 2017-11-25 DIAGNOSIS — R972 Elevated prostate specific antigen [PSA]: Secondary | ICD-10-CM

## 2017-11-25 DIAGNOSIS — E538 Deficiency of other specified B group vitamins: Secondary | ICD-10-CM

## 2017-11-25 DIAGNOSIS — E785 Hyperlipidemia, unspecified: Secondary | ICD-10-CM

## 2017-11-26 ENCOUNTER — Other Ambulatory Visit (INDEPENDENT_AMBULATORY_CARE_PROVIDER_SITE_OTHER): Payer: Medicare HMO

## 2017-11-26 DIAGNOSIS — R972 Elevated prostate specific antigen [PSA]: Secondary | ICD-10-CM

## 2017-11-26 DIAGNOSIS — E785 Hyperlipidemia, unspecified: Secondary | ICD-10-CM | POA: Diagnosis not present

## 2017-11-26 DIAGNOSIS — M1A0710 Idiopathic chronic gout, right ankle and foot, without tophus (tophi): Secondary | ICD-10-CM

## 2017-11-26 DIAGNOSIS — R7303 Prediabetes: Secondary | ICD-10-CM

## 2017-11-26 LAB — LIPID PANEL
CHOL/HDL RATIO: 5
Cholesterol: 208 mg/dL — ABNORMAL HIGH (ref 0–200)
HDL: 42 mg/dL (ref >39.00)
NONHDL: 165.97
TRIGLYCERIDES: 287 mg/dL — AB (ref 0.0–149.0)
VLDL: 57.4 mg/dL — AB (ref 0.0–40.0)

## 2017-11-26 LAB — BASIC METABOLIC PANEL
BUN: 16 mg/dL (ref 6–23)
CALCIUM: 9.3 mg/dL (ref 8.4–10.5)
CO2: 31 meq/L (ref 19–32)
CREATININE: 0.93 mg/dL (ref 0.40–1.50)
Chloride: 103 mEq/L (ref 96–112)
GFR: 84.63 mL/min (ref >60.00)
GLUCOSE: 111 mg/dL — AB (ref 70–99)
Potassium: 5.1 mEq/L (ref 3.5–5.1)
Sodium: 141 mEq/L (ref 135–145)

## 2017-11-26 LAB — PSA: PSA: 2.25 ng/mL (ref 0.10–4.00)

## 2017-11-26 LAB — HEMOGLOBIN A1C: Hgb A1c MFr Bld: 6.1 % (ref 4.6–6.5)

## 2017-11-26 LAB — URIC ACID: Uric Acid, Serum: 7.7 mg/dL (ref 4.0–7.8)

## 2017-11-26 LAB — LDL CHOLESTEROL, DIRECT: Direct LDL: 144 mg/dL

## 2017-11-28 ENCOUNTER — Ambulatory Visit (INDEPENDENT_AMBULATORY_CARE_PROVIDER_SITE_OTHER): Payer: Medicare HMO | Admitting: Family Medicine

## 2017-11-28 ENCOUNTER — Ambulatory Visit: Payer: Self-pay | Admitting: Family Medicine

## 2017-11-28 ENCOUNTER — Telehealth: Payer: Self-pay | Admitting: Family Medicine

## 2017-11-28 ENCOUNTER — Encounter: Payer: Self-pay | Admitting: Family Medicine

## 2017-11-28 VITALS — BP 116/60 | HR 84 | Temp 97.9°F | Wt 221.0 lb

## 2017-11-28 DIAGNOSIS — M1A0710 Idiopathic chronic gout, right ankle and foot, without tophus (tophi): Secondary | ICD-10-CM | POA: Diagnosis not present

## 2017-11-28 DIAGNOSIS — G6289 Other specified polyneuropathies: Secondary | ICD-10-CM | POA: Diagnosis not present

## 2017-11-28 DIAGNOSIS — R972 Elevated prostate specific antigen [PSA]: Secondary | ICD-10-CM | POA: Diagnosis not present

## 2017-11-28 DIAGNOSIS — E785 Hyperlipidemia, unspecified: Secondary | ICD-10-CM

## 2017-11-28 DIAGNOSIS — I10 Essential (primary) hypertension: Secondary | ICD-10-CM

## 2017-11-28 DIAGNOSIS — Z23 Encounter for immunization: Secondary | ICD-10-CM

## 2017-11-28 DIAGNOSIS — R7303 Prediabetes: Secondary | ICD-10-CM | POA: Diagnosis not present

## 2017-11-28 DIAGNOSIS — E538 Deficiency of other specified B group vitamins: Secondary | ICD-10-CM

## 2017-11-28 DIAGNOSIS — Z1211 Encounter for screening for malignant neoplasm of colon: Secondary | ICD-10-CM

## 2017-11-28 MED ORDER — FISH OIL 1000 MG PO CAPS: 2000.0000 mg | ORAL_CAPSULE | Freq: Every day | ORAL | 0 refills | Status: DC

## 2017-11-28 NOTE — Assessment & Plan Note (Signed)
Level has decreased. Will recheck in 6 months.

## 2017-11-28 NOTE — Telephone Encounter (Signed)
Left message for pt to call back.  Dr. Darnell Level needs decision if pt wants colonoscopy.

## 2017-11-28 NOTE — Telephone Encounter (Signed)
Plz call - I forgot to ask - what was his decision on colonoscopy as we're overdue?

## 2017-11-28 NOTE — Assessment & Plan Note (Signed)
Chronic, stable. Continue current regimen. 

## 2017-11-28 NOTE — Assessment & Plan Note (Addendum)
Chronic, not on statin. Some improvement noted.  He has been taking fish oil 1000mg  daily. Will increase to 2000mg  daily.  The 10-year ASCVD risk score Devin Huffman., et al., 2013) is: 40.1%   Values used to calculate the score:     Age: 71 years     Sex: Male     Is Non-Hispanic African American: No     Diabetic: Yes     Tobacco smoker: No     Systolic Blood Pressure: 505 mmHg     Is BP treated: Yes     HDL Cholesterol: 42 mg/dL     Total Cholesterol: 208 mg/dL

## 2017-11-28 NOTE — Patient Instructions (Addendum)
Flu shot today.  Decrease added sugars, eliminate trans fats, increase fiber and limit alcohol. Increase fatty fish in diet (salmon, tuna, trout). All these changes together can drop triglycerides by almost 50%.  Return in 6 months for medicare wellness visit with Katha Cabal and physical with me. Low-Purine Diet Purines are compounds that affect the level of uric acid in your body. A low-purine diet is a diet that is low in purines. Eating a low-purine diet can prevent the level of uric acid in your body from getting too high and causing gout or kidney stones or both. What do I need to know about this diet?  Choose low-purine foods. Examples of low-purine foods are listed in the next section.  Drink plenty of fluids, especially water. Fluids can help remove uric acid from your body. Try to drink 8-16 cups (1.9-3.8 L) a day.  Limit foods high in fat, especially saturated fat, as fat makes it harder for the body to get rid of uric acid. Foods high in saturated fat include pizza, cheese, ice cream, whole milk, fried foods, and gravies. Choose foods that are lower in fat and lean sources of protein. Use olive oil when cooking as it contains healthy fats that are not high in saturated fat.  Limit alcohol. Alcohol interferes with the elimination of uric acid from your body. If you are having a gout attack, avoid all alcohol.  Keep in mind that different people's bodies react differently to different foods. You will probably learn over time which foods do or do not affect you. If you discover that a food tends to cause your gout to flare up, avoid eating that food. You can more freely enjoy foods that do not cause problems. If you have any questions about a food item, talk to your dietitian or health care provider. Which foods are low, moderate, and high in purines? The following is a list of foods that are low, moderate, and high in purines. You can eat any amount of the foods that are low in purines. You may  be able to have small amounts of foods that are moderate in purines. Ask your health care provider how much of a food moderate in purines you can have. Avoid foods high in purines. Grains  Foods low in purines: Enriched white bread, pasta, rice, cake, cornbread, popcorn.  Foods moderate in purines: Whole-grain breads and cereals, wheat germ, bran, oatmeal. Uncooked oatmeal. Dry wheat bran or wheat germ.  Foods high in purines: Pancakes, [place] toast, biscuits, muffins. Vegetables  Foods low in purines: All vegetables, except those that are moderate in purines.  Foods moderate in purines: Asparagus, cauliflower, spinach, mushrooms, green peas. Fruits  All fruits are low in purines. Meats and other Protein Foods  Foods low in purines: Eggs, nuts, peanut butter.  Foods moderate in purines: 80-90% lean beef, lamb, veal, pork, poultry, fish, eggs, peanut butter, nuts. Crab, lobster, oysters, and shrimp. Cooked dried beans, peas, and lentils.  Foods high in purines: Anchovies, sardines, herring, mussels, tuna, codfish, scallops, trout, and haddock. Berniece Salines. Organ meats (such as liver or kidney). Tripe. Game meat. Goose. Sweetbreads. Dairy  All dairy foods are low in purines. Low-fat and fat-free dairy products are best because they are low in saturated fat. Beverages  Drinks low in purines: Water, carbonated beverages, tea, coffee, cocoa.  Drinks moderate in purines: Soft drinks and other drinks sweetened with high-fructose corn syrup. Juices. To find whether a food or drink is sweetened with high-fructose corn syrup,  look at the ingredients list.  Drinks high in purines: Alcoholic beverages (such as beer). Condiments  Foods low in purines: Salt, herbs, olives, pickles, relishes, vinegar.  Foods moderate in purines: Butter, margarine, oils, mayonnaise. Fats and Oils  Foods low in purines: All types, except gravies and sauces made with meat.  Foods high in purines: Gravies and sauces  made with meat. Other Foods  Foods low in purines: Sugars, sweets, gelatin. Cake. Soups made without meat.  Foods moderate in purines: Meat-based or fish-based soups, broths, or bouillons. Foods and drinks sweetened with high-fructose corn syrup.  Foods high in purines: High-fat desserts (such as ice cream, cookies, cakes, pies, doughnuts, and chocolate). Contact your dietitian for more information on foods that are not listed here. This information is not intended to replace advice given to you by your health care provider. Make sure you discuss any questions you have with your health care provider. Document Released: 03/24/2011 Document Revised: 05/04/2016 Document Reviewed: 11/03/2013 Elsevier Interactive Patient Education  2017 [employer].

## 2017-11-28 NOTE — Progress Notes (Signed)
BP 116/60 (BP Location: Left Arm, Patient Position: Sitting, Cuff Size: Normal)   Pulse 84   Temp 97.9 F (36.6 C) (Oral)   Wt 221 lb (100.2 kg)   SpO2 95%   BMI 31.71 kg/m    CC: 6 mo f/u visit Subjective:    Patient ID: Devin Huffman, male    DOB: [birth year], 71 y.o.   MRN: [id number]  HPI: Devin Huffman is a 71 y.o. male presenting on 11/28/2017 for 6 mo follow-up   HLD - lipitor caused myalgias. Last visit we restarted fish oil 2000mg  daily.   Elevated PSA - due for recheck.   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  Lab Results  Component Value Date   [id number] 751 08/30/2016     Gout - treats with PRN indocin. No significant flares in the past year.   Relevant past medical, surgical, family and social history reviewed and updated as indicated. Interim medical history since our last visit reviewed. Allergies and medications reviewed and updated. Outpatient Medications Prior to Visit  Medication Sig Dispense Refill  . amLODipine (NORVASC) 5 MG tablet Take 1 tablet (5 mg total) by mouth daily. 90 tablet 1  . aspirin EC 81 MG tablet Take 1 tablet (81 mg total) by mouth daily.    Marland Kitchen b complex vitamins tablet Take 1 tablet by mouth daily.    . indomethacin (INDOCIN) 50 MG capsule TAKE ONE CAPSULE BY MOUTH THREE TIMES DAILY AS NEEDED 10 capsule 0  . metoprolol succinate (TOPROL-XL) 25 MG 24 hr tablet Take 1 tablet (25 mg total) by mouth at bedtime. 90 tablet 1  . omeprazole (PRILOSEC) 20 MG capsule TAKE ONE CAPSULE BY MOUTH ONCE DAILY AS NEEDED 90 capsule 0  . vitamin C (ASCORBIC ACID) 500 MG tablet Take 500 mg by mouth 2 (two) times daily.    . colchicine 0.6 MG tablet Take 1 tablet (0.6 mg total) by mouth daily as needed (gout).     No facility-administered medications prior to visit.      Per HPI unless specifically indicated in ROS section below Review of Systems     Objective:    BP 116/60 (BP Location: Left Arm, Patient Position: Sitting, Cuff  Size: Normal)   Pulse 84   Temp 97.9 F (36.6 C) (Oral)   Wt 221 lb (100.2 kg)   SpO2 95%   BMI 31.71 kg/m   Wt Readings from Last 3 Encounters:  11/28/17 221 lb (100.2 kg)  05/28/17 234 lb 12 oz (106.5 kg)  05/22/17 233 lb 8 oz (105.9 kg)    Physical Exam  Constitutional: He appears well-developed and well-nourished. No distress.  HENT:  Mouth/Throat: Oropharynx is clear and moist. No oropharyngeal exudate.  Cardiovascular: Normal rate, regular rhythm, normal heart sounds and intact distal pulses.  No murmur heard. Pulmonary/Chest: Effort normal and breath sounds normal. No respiratory distress. He has no wheezes. He has no rales.  Musculoskeletal: He exhibits no edema.  Skin: Skin is warm and dry. No rash noted.  Psychiatric: He has a normal mood and affect.  Nursing note and vitals reviewed.  Results for orders placed or performed in visit on 11/26/17  Uric acid  Result Value Ref Range   Uric Acid, Serum 7.7 4.0 - 7.8 mg/dL  PSA  Result Value Ref Range   PSA 2.25 0.10 - 4.00 ng/mL  Basic metabolic panel  Result Value Ref Range   Sodium 141 135 - 145 mEq/L   Potassium  5.1 3.5 - 5.1 mEq/L   Chloride 103 96 - 112 mEq/L   CO2 31 19 - 32 mEq/L   Glucose, Bld 111 (H) 70 - 99 mg/dL   BUN 16 6 - 23 mg/dL   Creatinine, Ser 0.93 0.40 - 1.50 mg/dL   Calcium 9.3 8.4 - 10.5 mg/dL   GFR 84.63 >60.00 mL/min  Hemoglobin A1c  Result Value Ref Range   Hgb A1c MFr Bld 6.1 4.6 - 6.5 %  Lipid panel  Result Value Ref Range   Cholesterol 208 (H) 0 - 200 mg/dL   Triglycerides 287.0 (H) 0.0 - 149.0 mg/dL   HDL 42.00 >39.00 mg/dL   VLDL 57.4 (H) 0.0 - 40.0 mg/dL   Total CHOL/HDL Ratio 5    NonHDL 165.97   LDL cholesterol, direct  Result Value Ref Range   Direct LDL 144.0 mg/dL      Assessment & Plan:  Reviewed labs with patient and wife.  Problem List Items Addressed This Visit    Dyslipidemia    Chronic, not on statin. Some improvement noted.  He has been taking fish oil  1000mg  daily. Will increase to 2000mg  daily.  The 10-year ASCVD risk score Mikey Bussing DC Brooke Bonito., et al., 2013) is: 40.1%   Values used to calculate the score:     Age: 34 years     Sex: Male     Is Non-Hispanic African American: No     Diabetic: Yes     Tobacco smoker: No     Systolic Blood Pressure: 381 mmHg     Is BP treated: Yes     HDL Cholesterol: 42 mg/dL     Total Cholesterol: 208 mg/dL       Essential hypertension    Chronic, stable. Continue current regimen.       Gout    Reviewed with patient and wife. UA levels decreasing. Provided with low purine diet. He is taking indocin PRN.       Increased prostate specific antigen (PSA) velocity    Level has decreased. Will recheck in 6 months.       Peripheral neuropathy    Ongoing despite b12 replacement. ?due to b12. Consider SPEP if not previously done.       Prediabetes    Chronic, stable. Continue to monitor. Encouraged avoiding added sugar in diet.       Vitamin B12 deficiency    Levels repleted. Continue B complex daily. Update levels next labwork.        Other Visit Diagnoses    Need for influenza vaccination    -  Primary   Relevant Orders   Flu Vaccine QUAD 6+ mos PF IM (Fluarix Quad PF) (Completed)       Follow up plan: Return in about 6 months (around 05/29/2018) for annual exam, prior fasting for blood work, medicare wellness visit.  Ria Bush, MD

## 2017-11-28 NOTE — Assessment & Plan Note (Signed)
Levels repleted. Continue B complex daily. Update levels next labwork.

## 2017-11-28 NOTE — Assessment & Plan Note (Signed)
Reviewed with patient and wife. UA levels decreasing. Provided with low purine diet. He is taking indocin PRN.

## 2017-11-28 NOTE — Assessment & Plan Note (Signed)
Ongoing despite b12 replacement. ?due to b12. Consider SPEP if not previously done.

## 2017-11-28 NOTE — Assessment & Plan Note (Signed)
Chronic, stable. Continue to monitor. Encouraged avoiding added sugar in diet.

## 2017-11-29 NOTE — Telephone Encounter (Addendum)
Pt is returning felicia call. Pt would like to proceed with getting colonoscopy. Pt last colonoscopy per pt was at [facility]

## 2017-11-29 NOTE — Telephone Encounter (Signed)
Noted. Fwd to Dr. G. 

## 2017-11-30 ENCOUNTER — Encounter: Payer: Self-pay | Admitting: Gastroenterology

## 2017-11-30 NOTE — Telephone Encounter (Signed)
Referral placed.

## 2017-11-30 NOTE — Telephone Encounter (Signed)
Called patient, had to [facility] , placed referral on [facility] .

## 2017-11-30 NOTE — Addendum Note (Signed)
Addended by: Ria Bush on: 11/30/2017 07:07 AM   Modules accepted: Orders

## 2017-12-13 ENCOUNTER — Other Ambulatory Visit: Payer: Self-pay | Admitting: Family Medicine

## 2017-12-13 NOTE — Telephone Encounter (Signed)
Last filled:  11/13/17, #10 Last OV:  11/28/17 Next OV:  06/03/18

## 2018-01-08 ENCOUNTER — Ambulatory Visit (AMBULATORY_SURGERY_CENTER): Payer: Self-pay

## 2018-01-08 ENCOUNTER — Encounter: Payer: Self-pay | Admitting: Gastroenterology

## 2018-01-08 VITALS — Ht 71.0 in | Wt 220.0 lb

## 2018-01-08 DIAGNOSIS — Z86010 Personal history of colonic polyps: Secondary | ICD-10-CM

## 2018-01-08 MED ORDER — NA SULFATE-K SULFATE-MG SULF 17.5-3.13-1.6 GM/177ML PO SOLN: 1.0000 | Freq: Once | ORAL | 0 refills | Status: AC

## 2018-01-08 NOTE — Progress Notes (Signed)
Per pt, no allergies to soy or egg products.Pt not taking any weight loss meds or using  O2 at home.  Pt refused emmi video. 

## 2018-01-11 HISTORY — PX: COLONOSCOPY: SHX174

## 2018-01-22 ENCOUNTER — Other Ambulatory Visit: Payer: Self-pay

## 2018-01-22 ENCOUNTER — Ambulatory Visit (AMBULATORY_SURGERY_CENTER): Payer: Medicare HMO | Admitting: Gastroenterology

## 2018-01-22 ENCOUNTER — Encounter: Payer: Self-pay | Admitting: Gastroenterology

## 2018-01-22 VITALS — BP 132/75 | HR 66 | Temp 97.1°F | Resp 21 | Ht 71.0 in | Wt 220.0 lb

## 2018-01-22 DIAGNOSIS — Z86010 Personal history of colonic polyps: Secondary | ICD-10-CM | POA: Diagnosis present

## 2018-01-22 DIAGNOSIS — I10 Essential (primary) hypertension: Secondary | ICD-10-CM | POA: Diagnosis not present

## 2018-01-22 DIAGNOSIS — K219 Gastro-esophageal reflux disease without esophagitis: Secondary | ICD-10-CM | POA: Diagnosis not present

## 2018-01-22 MED ORDER — SODIUM CHLORIDE 0.9 % IV SOLN: 500.0000 mL | Freq: Once | INTRAVENOUS | Status: DC

## 2018-01-22 NOTE — Progress Notes (Signed)
Report to PACU, RN, vss, BBS= Clear.  

## 2018-01-22 NOTE — Op Note (Signed)
[facility] Patient Name: Devin Huffman Procedure Date: 01/22/2018 10:34 AM MRN: [id number] Endoscopist: Ladene Artist , MD Age: 72 Referring MD:  Date of Birth: 03/05/1946 Gender: Male Account #: [id number] Procedure:                Colonoscopy Indications:              Surveillance: Personal history of adenomatous                            polyps on last colonoscopy > 5 years ago Medicines:                Monitored Anesthesia Care Procedure:                Pre-Anesthesia Assessment:                           - Prior to the procedure, a History and Physical                            was performed, and patient medications and                            allergies were reviewed. The patient's tolerance of                            previous anesthesia was also reviewed. The risks                            and benefits of the procedure and the sedation                            options and risks were discussed with the patient.                            All questions were answered, and informed consent                            was obtained. Prior Anticoagulants: The patient has                            taken no previous anticoagulant or antiplatelet                            agents. ASA Grade Assessment: II - A patient with                            mild systemic disease. After reviewing the risks                            and benefits, the patient was deemed in                            satisfactory condition to undergo the procedure.  After obtaining informed consent, the colonoscope                            was passed under direct vision. Throughout the                            procedure, the patient's blood pressure, pulse, and                            oxygen saturations were monitored continuously. The                            Model PCF-H190DL [id number]) scope was introduced                            through the anus and  advanced to the the cecum,                            identified by appendiceal orifice and ileocecal                            valve. The ileocecal valve, appendiceal orifice,                            and rectum were photographed. The quality of the                            bowel preparation was good. The colonoscopy was                            performed without difficulty. The patient tolerated                            the procedure well. Scope In: 10:44:32 AM Scope Out: 10:54:56 AM Scope Withdrawal Time: 0 hours 8 minutes 33 seconds  Total Procedure Duration: 0 hours 10 minutes 24 seconds  Findings:                 The perianal and digital rectal examinations were                            normal.                           Multiple medium-mouthed diverticula were found in                            the left colon. There was no evidence of                            diverticular bleeding.                           Internal hemorrhoids were found during  retroflexion. The hemorrhoids were medium-sized and                            Grade I (internal hemorrhoids that do not prolapse).                           The exam was otherwise without abnormality on                            direct and retroflexion views. Complications:            No immediate complications. Estimated blood loss:                            None. Estimated Blood Loss:     Estimated blood loss: none. Impression:               - Mild diverticulosis in the left colon. There was                            no evidence of diverticular bleeding.                           - Internal hemorrhoids.                           - The examination was otherwise normal on direct                            and retroflexion views.                           - No specimens collected. Recommendation:           - Repeat colonoscopy in 5 years for surveillance.                           - Patient has  a contact number available for                            emergencies. The signs and symptoms of potential                            delayed complications were discussed with the                            patient. Return to normal activities tomorrow.                            Written discharge instructions were provided to the                            patient.                           - High fiber diet.                           -  Continue present medications. Ladene Artist, MD 01/22/2018 10:58:52 AM This report has been signed electronically.

## 2018-01-22 NOTE — Progress Notes (Signed)
Pt's states no medical or surgical changes since previsit or office visit. 

## 2018-01-22 NOTE — Patient Instructions (Signed)
YOU HAD AN ENDOSCOPIC PROCEDURE TODAY AT [facility]:   Refer to the procedure report that was given to you for any specific questions about what was found during the examination.  If the procedure report does not answer your questions, please call your gastroenterologist to clarify.  If you requested that your care partner not be given the details of your procedure findings, then the procedure report has been included in a sealed envelope for you to review at your convenience later.  YOU SHOULD EXPECT: Some feelings of bloating in the abdomen. Passage of more gas than usual.  Walking can help get rid of the air that was put into your GI tract during the procedure and reduce the bloating. If you had a lower endoscopy (such as a colonoscopy or flexible sigmoidoscopy) you may notice spotting of blood in your stool or on the toilet paper. If you underwent a bowel prep for your procedure, you may not have a normal bowel movement for a few days.  Please Note:  You might notice some irritation and congestion in your nose or some drainage.  This is from the oxygen used during your procedure.  There is no need for concern and it should clear up in a day or so.  SYMPTOMS TO REPORT IMMEDIATELY:   Following lower endoscopy (colonoscopy or flexible sigmoidoscopy):  Excessive amounts of blood in the stool  Significant tenderness or worsening of abdominal pains  Swelling of the abdomen that is new, acute  Fever of 100F or higher   For urgent or emergent issues, a gastroenterologist can be reached at any hour by calling [phone].   DIET:  We do recommend a small meal at first, but then you may proceed to your regular diet.  Drink plenty of fluids but you should avoid alcoholic beverages for 24 hours.  ACTIVITY:  You should plan to take it easy for the rest of today and you should NOT DRIVE or use heavy machinery until tomorrow (because of the sedation medicines used during the test).     FOLLOW UP: Our staff will call the number listed on your records the next business day following your procedure to check on you and address any questions or concerns that you may have regarding the information given to you following your procedure. If we do not reach you, we will leave a message.  However, if you are feeling well and you are not experiencing any problems, there is no need to return our call.  We will assume that you have returned to your regular daily activities without incident.  If any biopsies were taken you will be contacted by phone or by letter within the next 1-3 weeks.  Please call us at [phone] if you have not heard about the biopsies in 3 weeks.    SIGNATURES/CONFIDENTIALITY: You and/or your care partner have signed paperwork which will be entered into your electronic medical record.  These signatures attest to the fact that that the information above on your After Visit Summary has been reviewed and is understood.  Full responsibility of the confidentiality of this discharge information lies with you and/or your care-partner.   Resume medications.. Information given on diverticulosis and hemorrhoids.

## 2018-01-23 ENCOUNTER — Encounter: Payer: Self-pay | Admitting: Family Medicine

## 2018-01-23 ENCOUNTER — Telehealth: Payer: Self-pay

## 2018-01-23 NOTE — Telephone Encounter (Signed)
  Follow up Call-  Call back number 01/22/2018  Post procedure Call Back phone  # [phone]  Permission to leave phone message Yes  Some recent data might be hidden     Patient questions:  Do you have a fever, pain , or abdominal swelling? No. Pain Score  0 *  Have you tolerated food without any problems? Yes.    Have you been able to return to your normal activities? Yes.    Do you have any questions about your discharge instructions: Diet   No. Medications  No. Follow up visit  No.  Do you have questions or concerns about your Care? No.  Actions: * If pain score is 4 or above: No action needed, pain <4.

## 2018-02-12 DIAGNOSIS — Z0100 Encounter for examination of eyes and vision without abnormal findings: Secondary | ICD-10-CM | POA: Diagnosis not present

## 2018-02-12 DIAGNOSIS — H52223 Regular astigmatism, bilateral: Secondary | ICD-10-CM | POA: Diagnosis not present

## 2018-04-15 ENCOUNTER — Telehealth: Payer: Self-pay | Admitting: Family Medicine

## 2018-04-15 MED ORDER — INDOMETHACIN 50 MG PO CAPS: ORAL_CAPSULE | ORAL | 0 refills | Status: DC

## 2018-04-15 NOTE — Telephone Encounter (Signed)
Copied from [place]. Topic: Quick Communication - Rx Refill/Question >> Apr 15, 2018  9:37 AM Carolyn Stare wrote: Medication  indomethacin (INDOCIN) 50 MG capsule   Preferred Pharmacy  [facility]   Agent: Please be advised that RX refills may take up to 3 business days. We ask that you follow-up with your pharmacy.

## 2018-04-17 ENCOUNTER — Encounter: Payer: Self-pay | Admitting: Family Medicine

## 2018-04-17 ENCOUNTER — Ambulatory Visit (INDEPENDENT_AMBULATORY_CARE_PROVIDER_SITE_OTHER): Payer: Medicare HMO | Admitting: Family Medicine

## 2018-04-17 ENCOUNTER — Other Ambulatory Visit: Payer: Self-pay

## 2018-04-17 ENCOUNTER — Ambulatory Visit: Payer: Self-pay | Admitting: Family Medicine

## 2018-04-17 VITALS — BP 160/82 | HR 81 | Temp 98.6°F | Ht 70.0 in | Wt 233.8 lb

## 2018-04-17 DIAGNOSIS — M10071 Idiopathic gout, right ankle and foot: Secondary | ICD-10-CM

## 2018-04-17 MED ORDER — PREDNISONE 20 MG PO TABS: ORAL_TABLET | ORAL | 0 refills | Status: DC

## 2018-04-17 MED ORDER — COLCHICINE 0.6 MG PO TABS
0.6000 mg | ORAL_TABLET | Freq: Every day | ORAL | 2 refills | Status: DC
Start: 2018-04-17 — End: 2018-05-27

## 2018-04-17 NOTE — Progress Notes (Signed)
Dr. Frederico Hamman T. Trasean Delima, MD, [facility] [place], [address] Phone: [phone] Fax: [phone]  04/17/2018  Patient: Devin Huffman, MRN: [id number], DOB: [birth year], 72 y.o.  Primary Physician:  Ria Bush, MD   Chief Complaint  Patient presents with  . Foot Pain    Right ?Gout   Subjective:   Devin Huffman is a 72 y.o. very pleasant male patient who presents with the following:  72 year old gentleman with a history of non-crystal proven gout who presents with an approximate 5-day history of right-sided foot pain, swelling, redness, with intermittent warmth.  In the past he is always taken Indocin with good results, and now he has been taking some Indocin as well as ibuprofen, and his symptoms have been persistent.  He is been having trouble walking, and he missed 2 days of work.  Started end of last week, took some Indocin. Worked   Past Medical History, Surgical History, Social History, Family History, Problem List, Medications, and Allergies have been reviewed and updated if relevant.  Patient Active Problem List   Diagnosis Date Noted  . Health maintenance examination 05/28/2017  . Foot pain, bilateral 05/28/2017  . Increased prostate specific antigen (PSA) velocity 05/28/2017  . Skin rash 05/28/2017  . Tachycardia 03/26/2017  . History of right hip replacement 03/26/2017  . OA (osteoarthritis) of hip 03/07/2017  . Pre-op exam 02/22/2017  . 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 Peripheral neuropathy 08/31/2016  . Heat intolerance 08/31/2016  . Obesity, Class I, BMI 30-34.9 05/14/2015  . Advanced care planning/counseling discussion 11/11/2014  . Vitamin B12 deficiency 10/15/2012  . Medicare annual wellness visit, subsequent 10/02/2011  . GERD (gastroesophageal reflux disease) 10/02/2011  . Dyslipidemia 07/15/2009  . Gout 07/15/2009  . Prediabetes 03/22/2009  . Essential hypertension  06/23/2008    Past Medical History:  Diagnosis Date  . Cancer ([place])    skin cancer removed from nose  . Cerumen impaction 2016   bilateral with otitis externa s/p ENT removal - [facility]  . GERD (gastroesophageal reflux disease)    occasional  . Gout    ?due to L great toe pain, saw Dr Gladstone Lighter  . History of chicken pox   . Hyperlipidemia   . Hypertension   . Legg-Perthes disease    Right hip  . Nasal septal deviation    monitoring - [facility]  . Prediabetes 03/22/2009  . Sepsis ([place])    In March 2018    Past Surgical History:  Procedure Laterality Date  . COLONOSCOPY  03/2011   2 tubular adenomas, rec rpt 5 yrs  . COLONOSCOPY  01/2018   diverticulosis, rpt 5 yrs [facility])  . HERNIA REPAIR     double hernia  . 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 TONSILLECTOMY    . TOTAL HIP ARTHROPLASTY Right 03/07/2017   Procedure: RIGHT TOTAL HIP ARTHROPLASTY ANTERIOR APPROACH;  Surgeon: Gaynelle Arabian, MD;  Location: [facility];  Service: Orthopedics;  Laterality: Right;  Marland Kitchen VASECTOMY    . WISDOM TOOTH EXTRACTION      Social History   Socioeconomic History  . Marital status: Married    Spouse name: Not on file  . Number of children: Not on file  . Years of education: Not on file  . Highest education level: Not on file  Occupational History  .  Occupation: part-time  Social Needs  . Financial resource strain: Not on file  . Food insecurity:    Worry: Not on file    Inability: Not on file  . Transportation needs:    Medical: Not on file    Non-medical: Not on file  Tobacco Use  . Smoking status: Never Smoker  . Smokeless tobacco: Never Used  Substance and Sexual Activity  . Alcohol use: No  . Drug use: No  . Sexual activity: Yes  Lifestyle  . Physical activity:    Days per week: Not on file    Minutes per session: Not on file  . Stress: Not on  file  Relationships  . Social connections:    Talks on phone: Not on file    Gets together: Not on file    Attends religious service: Not on file    Active member of club or organization: Not on file    Attends meetings of clubs or organizations: Not on file    Relationship status: Not on file  . Intimate partner violence:    Fear of current or ex partner: Not on file    Emotionally abused: Not on file    Physically abused: Not on file    Forced sexual activity: Not on file  Other Topics Concern  . 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  No regular exercise.   Diet: "I love my ice cream. I can't live forever", some water    Family History  Problem Relation Age of Onset  . Breast cancer Mother        with recurrence  . Alzheimer's disease Father   . Cervical cancer Sister   . Esophageal cancer Sister   . Lung cancer Sister   . Cancer Maternal Aunt        ?  Marland Kitchen Hypertension Maternal Uncle   . Stroke Maternal Uncle   . Coronary artery disease Paternal Uncle        MI  . Cancer Other        niece-2 types  . Stroke Maternal Grandfather     Allergies  Allergen Reactions  . Lisinopril Hives and Swelling    ?angioedema - lip swelling, hives    Medication list reviewed and updated in full in [place].  GEN: No fevers, chills. Nontoxic. Primarily MSK c/o today. MSK: Detailed in the HPI GI: tolerating PO intake without difficulty Neuro: No numbness, parasthesias, or tingling associated. Otherwise the pertinent positives of the ROS are noted above.   Objective:   BP (!) 160/82   Pulse 81   Temp 98.6 F (37 C) (Oral)   Ht 5\' 10"  (1.778 m)   Wt 233 lb 12 oz (106 kg)   BMI 33.54 kg/m    GEN: WDWN, NAD, Non-toxic, Alert & Oriented x 3 HEENT: Atraumatic, Normocephalic.  Ears and  Nose: No external deformity. EXTR: No clubbing/cyanosis/edema NEURO: Normal gait.  PSYCH: Normally interactive. Conversant. Not depressed or anxious appearing.  Calm demeanor.    Almost the entirety of the dorsum of the foot is reddened in appearance, it is warm as well as tender to palpation.  His toes do move freely without any difficulty.  The medial aspect of the foot seems to be most tender.  He does move his ankle reasonably  well.  He is able to ambulate, but he does have a antalgic gait.  Radiology: No results found.  Assessment and Plan:   Acute idiopathic gout of right foot  Probable gout attack, cannot exclude CPPD.  Failure of 2 NSAIDs, start with prednisone and colchicine.  Reviewed purine diet.  Follow-up: No follow-ups on file.  Meds ordered this encounter  Medications  . predniSONE (DELTASONE) 20 MG tablet    Sig: 2 tabs po for 4 days, then 1 tab for 4 days    Dispense:  12 tablet    Refill:  0  . colchicine 0.6 MG tablet    Sig: Take 1 tablet (0.6 mg total) by mouth daily.    Dispense:  30 tablet    Refill:  2   Patient Instructions  Low-Purine Diet Purines are compounds that affect the level of uric acid in your body. A low-purine diet is a diet that is low in purines. Eating a low-purine diet can prevent the level of uric acid in your body from getting too high and causing gout or kidney stones or both. What do I need to know about this diet?  Choose low-purine foods. Examples of low-purine foods are listed in the next section.  Drink plenty of fluids, especially water. Fluids can help remove uric acid from your body. Try to drink 8-16 cups (1.9-3.8 L) a day.  Limit foods high in fat, especially saturated fat, as fat makes it harder for the body to get rid of uric acid. Foods high in saturated fat include pizza, cheese, ice cream, whole milk, fried foods, and gravies. Choose foods that are lower in fat and lean sources of protein. Use olive oil when cooking as  it contains healthy fats that are not high in saturated fat.  Limit alcohol. Alcohol interferes with the elimination of uric acid from your body. If you are having a gout attack, avoid all alcohol.  Keep in mind that different people's bodies react differently to different foods. You will probably learn over time which foods do or do not affect you. If you discover that a food tends to cause your gout to flare up, avoid eating that food. You can more freely enjoy foods that do not cause problems. If you have any questions about a food item, talk to your dietitian or health care provider. Which foods are low, moderate, and high in purines? The following is a list of foods that are low, moderate, and high in purines. You can eat any amount of the foods that are low in purines. You may be able to have small amounts of foods that are moderate in purines. Ask your health care provider how much of a food moderate in purines you can have. Avoid foods high in purines. Grains  Foods low in purines: Enriched white bread, pasta, rice, cake, cornbread, popcorn.  Foods moderate in purines: Whole-grain breads and cereals, wheat germ, bran, oatmeal. Uncooked oatmeal. Dry wheat bran or wheat germ.  Foods high in purines: Pancakes, [place] toast, biscuits, muffins. Vegetables  Foods low in purines: All vegetables, except those that are moderate in purines.  Foods moderate in purines: Asparagus, cauliflower, spinach, mushrooms, green peas. Fruits  All fruits are low in purines. Meats and other Protein Foods  Foods low in purines: Eggs, nuts, peanut butter.  Foods moderate in purines: 80-90% lean beef, lamb, veal, pork, poultry, fish, eggs, peanut butter, nuts. Crab, lobster, oysters, and shrimp. Cooked dried beans, peas, and lentils.  Foods  high in purines: Anchovies, sardines, herring, mussels, tuna, codfish, scallops, trout, and haddock. Berniece Salines. Organ meats (such as liver or kidney). Tripe. Game meat.  Goose. Sweetbreads. Dairy  All dairy foods are low in purines. Low-fat and fat-free dairy products are best because they are low in saturated fat. Beverages  Drinks low in purines: Water, carbonated beverages, tea, coffee, cocoa.  Drinks moderate in purines: Soft drinks and other drinks sweetened with high-fructose corn syrup. Juices. To find whether a food or drink is sweetened with high-fructose corn syrup, look at the ingredients list.  Drinks high in purines: Alcoholic beverages (such as beer). Condiments  Foods low in purines: Salt, herbs, olives, pickles, relishes, vinegar.  Foods moderate in purines: Butter, margarine, oils, mayonnaise. Fats and Oils  Foods low in purines: All types, except gravies and sauces made with meat.  Foods high in purines: Gravies and sauces made with meat. Other Foods  Foods low in purines: Sugars, sweets, gelatin. Cake. Soups made without meat.  Foods moderate in purines: Meat-based or fish-based soups, broths, or bouillons. Foods and drinks sweetened with high-fructose corn syrup.  Foods high in purines: High-fat desserts (such as ice cream, cookies, cakes, pies, doughnuts, and chocolate). Contact your dietitian for more information on foods that are not listed here. This information is not intended to replace advice given to you by your health care provider. Make sure you discuss any questions you have with your health care provider. Document Released: 03/24/2011 Document Revised: 05/04/2016 Document Reviewed: 11/03/2013 Elsevier Interactive Patient Education  2017 [place],  [place] T. Tilford Deaton, MD   Allergies as of 04/17/2018      Reactions   Lisinopril Hives, Swelling   ?angioedema - lip swelling, hives      Medication List        Accurate as of 04/17/18  6:27 PM. Always use your most recent med list.          amLODipine 5 MG tablet Commonly known as:  NORVASC Take 1 tablet (5 mg total) by mouth daily.     aspirin EC 81 MG tablet Take 1 tablet (81 mg total) by mouth daily.   b complex vitamins tablet Take 1 tablet by mouth daily.   colchicine 0.6 MG tablet Take 1 tablet (0.6 mg total) by mouth daily.   Fish Oil 1000 MG Caps Take 2 capsules (2,000 mg total) by mouth daily.   indomethacin 50 MG capsule Commonly known as:  INDOCIN TAKE 1 CAPSULE BY MOUTH THREE TIMES DAILY AS NEEDED   metoprolol succinate 25 MG 24 hr tablet Commonly known as:  TOPROL-XL Take 1 tablet (25 mg total) by mouth at bedtime.   omeprazole 20 MG capsule Commonly known as:  PRILOSEC TAKE ONE CAPSULE BY MOUTH ONCE DAILY AS NEEDED   predniSONE 20 MG tablet Commonly known as:  DELTASONE 2 tabs po for 4 days, then 1 tab for 4 days   VITAMIN B 12 PO Take by mouth daily at 6 (six) AM.   vitamin C 500 MG tablet Commonly known as:  ASCORBIC ACID Take 500 mg by mouth 2 (two) times daily.   Vitamin D3 20 MCG Tabs Take 25 mcg by mouth daily at 6 (six) AM.

## 2018-04-17 NOTE — Patient Instructions (Signed)

## 2018-04-18 ENCOUNTER — Other Ambulatory Visit: Payer: Self-pay | Admitting: Family Medicine

## 2018-04-23 ENCOUNTER — Other Ambulatory Visit: Payer: Self-pay | Admitting: Family Medicine

## 2018-05-20 ENCOUNTER — Other Ambulatory Visit: Payer: Self-pay | Admitting: Family Medicine

## 2018-05-20 NOTE — Telephone Encounter (Signed)
Last filled 04/15/18, #30 Last OV: 04/17/18 with Dr. Lorelei Pont Next OV (CPE):  06/03/18

## 2018-05-27 ENCOUNTER — Other Ambulatory Visit: Payer: Self-pay | Admitting: Family Medicine

## 2018-05-27 ENCOUNTER — Ambulatory Visit: Payer: Self-pay

## 2018-05-27 ENCOUNTER — Ambulatory Visit (INDEPENDENT_AMBULATORY_CARE_PROVIDER_SITE_OTHER): Payer: Medicare HMO

## 2018-05-27 VITALS — BP 120/82 | HR 72 | Temp 98.7°F | Ht 70.25 in | Wt 213.5 lb

## 2018-05-27 DIAGNOSIS — E538 Deficiency of other specified B group vitamins: Secondary | ICD-10-CM | POA: Diagnosis not present

## 2018-05-27 DIAGNOSIS — R972 Elevated prostate specific antigen [PSA]: Secondary | ICD-10-CM | POA: Diagnosis not present

## 2018-05-27 DIAGNOSIS — I10 Essential (primary) hypertension: Secondary | ICD-10-CM

## 2018-05-27 DIAGNOSIS — M1A0710 Idiopathic chronic gout, right ankle and foot, without tophus (tophi): Secondary | ICD-10-CM | POA: Diagnosis not present

## 2018-05-27 DIAGNOSIS — R7303 Prediabetes: Secondary | ICD-10-CM | POA: Diagnosis not present

## 2018-05-27 DIAGNOSIS — E785 Hyperlipidemia, unspecified: Secondary | ICD-10-CM

## 2018-05-27 DIAGNOSIS — Z0000 Encounter for general adult medical examination without abnormal findings: Secondary | ICD-10-CM

## 2018-05-27 LAB — COMPREHENSIVE METABOLIC PANEL
ALK PHOS: 81 U/L (ref 39–117)
ALT: 27 U/L (ref 0–53)
AST: 18 U/L (ref 0–37)
Albumin: 4.2 g/dL (ref 3.5–5.2)
BUN: 21 mg/dL (ref 6–23)
CHLORIDE: 105 meq/L (ref 96–112)
CO2: 27 mEq/L (ref 19–32)
Calcium: 9.2 mg/dL (ref 8.4–10.5)
Creatinine, Ser: 0.91 mg/dL (ref 0.40–1.50)
GFR: 86.66 mL/min (ref >60.00)
GLUCOSE: 129 mg/dL — AB (ref 70–99)
POTASSIUM: 4.4 meq/L (ref 3.5–5.1)
SODIUM: 140 meq/L (ref 135–145)
TOTAL PROTEIN: 6.7 g/dL (ref 6.0–8.3)
Total Bilirubin: 0.6 mg/dL (ref 0.2–1.2)

## 2018-05-27 LAB — LIPID PANEL
CHOL/HDL RATIO: 6
CHOLESTEROL: 254 mg/dL — AB (ref 0–200)
HDL: 41.5 mg/dL (ref >39.00)
NonHDL: 212.19
Triglycerides: 379 mg/dL — ABNORMAL HIGH (ref 0.0–149.0)
VLDL: 75.8 mg/dL — AB (ref 0.0–40.0)

## 2018-05-27 LAB — URIC ACID: URIC ACID, SERUM: 9.6 mg/dL — AB (ref 4.0–7.8)

## 2018-05-27 LAB — HEMOGLOBIN A1C: HEMOGLOBIN A1C: 6.7 % — AB (ref 4.6–6.5)

## 2018-05-27 LAB — MICROALBUMIN / CREATININE URINE RATIO
Creatinine,U: 113 mg/dL
MICROALB/CREAT RATIO: 0.6 mg/g (ref 0.0–30.0)
Microalb, Ur: <0.7 mg/dL (ref 0.0–1.9)

## 2018-05-27 LAB — LDL CHOLESTEROL, DIRECT: LDL DIRECT: 138 mg/dL

## 2018-05-27 LAB — VITAMIN B12: VITAMIN B 12: 388 pg/mL (ref 211–911)

## 2018-05-27 LAB — PSA: PSA: 1.61 ng/mL (ref 0.10–4.00)

## 2018-05-27 NOTE — Progress Notes (Signed)
Subjective:   Devin Huffman is a 72 y.o. male who presents for Medicare Annual/Subsequent preventive examination.  Review of Systems:  N/A Cardiac Risk Factors include: advanced age (>40men, >96 women);male gender;hypertension;dyslipidemia;obesity (BMI >30kg/m2)     Objective:    Vitals: BP 120/82 (BP Location: Right Arm, Patient Position: Sitting, Cuff Size: Normal)   Pulse 72   Temp 98.7 F (37.1 C) (Oral)   Ht 5' 10.25" (1.784 m) Comment: no shoes  Wt 213 lb 8 oz (96.8 kg)   SpO2 93%   BMI 30.42 kg/m   Body mass index is 30.42 kg/m.  Advanced Directives 05/27/2018 05/22/2017 03/26/2017 03/07/2017 02/28/2017 04/26/2013  Does Patient Have a Medical Advance Directive? No (No Data) No No No Patient does not have advance directive;Patient would not like information  Does patient want to make changes to medical advance directive? No - Patient declined - - - - -  Would patient like information on creating a medical advance directive? - - No - Patient declined No - Patient declined - -  Pre-existing out of facility DNR order (yellow form or pink MOST form) - - - - - No    Tobacco Social History   Tobacco Use  Smoking Status Never Smoker  Smokeless Tobacco Never Used     Counseling given: No   Clinical Intake:  Pre-visit preparation completed: Yes  Pain : No/denies pain Pain Score: 0-No pain     Nutritional Status: BMI > 30  Obese Nutritional Risks: None Diabetes: No  How often do you need to have someone help you when you read instructions, pamphlets, or other written materials from your doctor or pharmacy?: 1 - Never What is the last grade level you completed in school?: 12th grade  Interpreter Needed?: No  Comments: pt lives with spouse Information entered by :: LPinson, LPN  Past Medical History:  Diagnosis Date  . Cancer ([place])    skin cancer removed from nose  . Cerumen impaction 2016   bilateral with otitis externa s/p ENT removal - [facility]  . GERD  (gastroesophageal reflux disease)    occasional  . Gout    ?due to L great toe pain, saw Dr Gladstone Lighter  . History of chicken pox   . Hyperlipidemia   . Hypertension   . Legg-Perthes disease    Right hip  . Nasal septal deviation    monitoring - [facility]  . Prediabetes 03/22/2009  . Sepsis ([place])    In March 2018   Past Surgical History:  Procedure Laterality Date  . COLONOSCOPY  03/2011   2 tubular adenomas, rec rpt 5 yrs  . COLONOSCOPY  01/2018   diverticulosis, rpt 5 yrs [facility])  . HERNIA REPAIR     double hernia  . 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 TONSILLECTOMY    . TOTAL HIP ARTHROPLASTY Right 03/07/2017   Procedure: RIGHT TOTAL HIP ARTHROPLASTY ANTERIOR APPROACH;  Surgeon: Gaynelle Arabian, MD;  Location: [facility];  Service: Orthopedics;  Laterality: Right;  Marland Kitchen VASECTOMY    . WISDOM TOOTH EXTRACTION     Family History  Problem Relation Age of Onset  . Breast cancer Mother        with recurrence  . Alzheimer's disease Father   . Cervical cancer Sister   . Esophageal cancer Sister   . Lung cancer Sister   . Cancer Maternal Aunt        ?  Marland Kitchen  Hypertension Maternal Uncle   . Stroke Maternal Uncle   . Coronary artery disease Paternal Uncle        MI  . Cancer Other        niece-2 types  . Stroke Maternal Grandfather    Social History   Socioeconomic History  . Marital status: Married    Spouse name: Not on file  . Number of children: Not on file  . Years of education: Not on file  . Highest education level: Not on file  Occupational History  . Occupation: part-time  Social Needs  . Financial resource strain: Not on file  . Food insecurity:    Worry: Not on file    Inability: Not on file  . Transportation needs:    Medical: Not on file    Non-medical: Not on file  Tobacco Use  . Smoking status: Never Smoker  . Smokeless tobacco:  Never Used  Substance and Sexual Activity  . Alcohol use: No  . Drug use: No  . Sexual activity: Yes  Lifestyle  . Physical activity:    Days per week: Not on file    Minutes per session: Not on file  . Stress: Not on file  Relationships  . Social connections:    Talks on phone: Not on file    Gets together: Not on file    Attends religious service: Not on file    Active member of club or organization: Not on file    Attends meetings of clubs or organizations: Not on file    Relationship status: Not on file  Other Topics Concern  . 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  No regular exercise.   Diet: "I love my ice cream. I can't live forever", some water    Outpatient Encounter Medications as of 05/27/2018  Medication Sig  . amLODipine (NORVASC) 5 MG tablet Take 1 tablet (5 mg total) by mouth at bedtime.  Marland Kitchen aspirin EC 81 MG tablet Take 1 tablet (81 mg total) by mouth daily.  . Cholecalciferol (VITAMIN D3) 20 MCG TABS Take 25 mcg by mouth daily at 6 (six) AM.  . Cyanocobalamin (VITAMIN B 12 PO) Take by mouth daily at 6 (six) AM.  . indomethacin (INDOCIN) 50 MG capsule TAKE 1 CAPSULE BY MOUTH THREE TIMES DAILY AS NEEDED  . metoprolol succinate (TOPROL-XL) 25 MG 24 hr tablet Take 1 tablet (25 mg total) by mouth at bedtime.  . Omega-3 Fatty Acids (FISH OIL) 1000 MG CAPS Take 2 capsules (2,000 mg total) by mouth daily.  Marland Kitchen omeprazole (PRILOSEC) 20 MG capsule TAKE ONE CAPSULE BY MOUTH ONCE DAILY AS NEEDED  . vitamin C (ASCORBIC ACID) 500 MG tablet Take 500 mg by mouth 2 (two) times daily.  . [DISCONTINUED] b complex vitamins tablet Take 1 tablet by mouth daily.  . [DISCONTINUED] colchicine 0.6 MG tablet Take 1 tablet (0.6 mg total) by mouth daily.  . [DISCONTINUED] metoprolol succinate (TOPROL-XL) 25 MG 24  hr tablet Take 1 tablet (25 mg total) by mouth at bedtime.  . [DISCONTINUED] lisinopril (PRINIVIL,ZESTRIL) 5 MG tablet Take 1 tablet (5 mg total) by mouth daily.   No facility-administered encounter medications on file as of 05/27/2018.     Activities of Daily Living In your present state of health, do you have any  difficulty performing the following activities: 05/27/2018  Hearing? N  Vision? N  Difficulty concentrating or making decisions? N  Walking or climbing stairs? N  Dressing or bathing? N  Doing errands, shopping? N  Preparing Food and eating ? N  Using the Toilet? N  In the past six months, have you accidently leaked urine? N  Do you have problems with loss of bowel control? N  Managing your Medications? N  Managing your Finances? N  Housekeeping or managing your Housekeeping? N  Some recent data might be hidden    Patient Care Team: Ria Bush, MD as PCP - General (Family Medicine)   Assessment:   This is a routine wellness examination for [place].   Hearing Screening   125Hz  250Hz  500Hz  1000Hz  2000Hz  3000Hz  4000Hz  6000Hz  8000Hz   Right ear:   40 40 40  40    Left ear:   40 40 40  40    Vision Screening Comments: Vision exam in April 2019   Exercise Activities and Dietary recommendations Current Exercise Habits: The patient does not participate in regular exercise at present, Exercise limited by: orthopedic condition(s)  Goals    . Patient Stated     Starting 05/27/2018, I will continue to take medications as prescribed.         Fall Risk Fall Risk  05/27/2018 05/22/2017 11/17/2015 11/11/2014 11/04/2013  Falls in the past year? No No No No No   Depression Screen PHQ 2/9 Scores 05/27/2018 05/22/2017 11/17/2015 11/11/2014  PHQ - 2 Score 0 0 0 0  PHQ- 9 Score 0 - - -    Cognitive Function MMSE - Mini Mental State Exam 05/27/2018 05/22/2017  Orientation to time 5 5  Orientation to Place 5 5  Registration 3 3  Attention/ Calculation 0 0  Recall 3 3    Language- name 2 objects 0 0  Language- repeat 1 1  Language- follow 3 step command 3 3  Language- read & follow direction 0 0  Write a sentence 0 0  Copy design 0 0  Total score 20 20       PLEASE NOTE: A Mini-Cog screen was completed. Maximum score is 20. A value of 0 denotes this part of [place] MMSE was not completed or the patient failed this part of the Mini-Cog screening.   Mini-Cog Screening Orientation to Time - Max 5 pts Orientation to Place - Max 5 pts Registration - Max 3 pts Recall - Max 3 pts Language Repeat - Max 1 pts Language Follow 3 Step Command - Max 3 pts   Immunization History  Administered Date(s) Administered  . Influenza Split 10/15/2012  . Influenza,inj,Quad PF,6+ Mos 11/11/2014, 11/17/2015, 08/31/2016, 11/28/2017  . Pneumococcal Conjugate-13 11/11/2014  . Pneumococcal Polysaccharide-23 10/15/2012  . Td 12/12/1996, 03/22/2009  . Zoster 10/13/2013    Screening Tests Health Maintenance  Topic Date Due  . FOOT EXAM  06/03/2018 (Originally 05/13/2016)  . INFLUENZA VACCINE  07/11/2018  . HEMOGLOBIN A1C  11/26/2018  . OPHTHALMOLOGY EXAM  03/12/2019  . TETANUS/TDAP  03/23/2019  . URINE MICROALBUMIN  05/28/2019  . COLONOSCOPY  01/22/2023  . Hepatitis C Screening  Completed  . PNA vac Low Risk Adult  Completed  . DTaP/Tdap/Td  Discontinued      Plan:     I have personally reviewed, addressed, and noted the following in the patient's chart:  A. Medical and social history B. Use of alcohol, tobacco or illicit drugs  C. Current medications and supplements D.  Functional ability and status E.  Nutritional status F.  Physical activity G. Advance directives H. List of other physicians I.  Hospitalizations, surgeries, and ER visits in previous 12 months J.  [place] to include hearing, vision, cognitive, depression L. Referrals and appointments - none  In addition, I have reviewed and discussed with patient certain preventive  protocols, quality metrics, and best practice recommendations. A written personalized care plan for preventive services as well as general preventive health recommendations were provided to patient.  See attached scanned questionnaire for additional information.   Signed,   Lindell Noe, MHA, BS, LPN Health Coach

## 2018-05-27 NOTE — Progress Notes (Signed)
PCP notes:   Health maintenance:  A1C - completed Microalbumin - completed Eye exam - per pt, exam in April 2019  Abnormal screenings:   None  Patient concerns:   None  Nurse concerns:  None  Next PCP appt:   06/03/2018 @ 1500

## 2018-05-27 NOTE — Patient Instructions (Addendum)
Devin Huffman , Thank you for taking time to come for your Medicare Wellness Visit. I appreciate your ongoing commitment to your health goals. Please review the following plan we discussed and let me know if I can assist you in the future.   These are the goals we discussed: Goals    . Patient Stated     Starting 05/27/2018, I will continue to take medications as prescribed.         This is a list of the screening recommended for you and due dates:  Health Maintenance  Topic Date Due  . Complete foot exam   06/03/2018*  . Flu Shot  07/11/2018  . Hemoglobin A1C  11/26/2018  . Eye exam for diabetics  03/12/2019  . Tetanus Vaccine  03/23/2019  . Urine Protein Check  05/28/2019  . Colon Cancer Screening  01/22/2023  .  Hepatitis C: One time screening is recommended by Center for Disease Control  (CDC) for  adults born from [birth year] through 1965.   Completed  . Pneumonia vaccines  Completed  . DTaP/Tdap/Td vaccine  Discontinued  *Topic was postponed. The date shown is not the original due date.   Preventive Care for Adults  A healthy lifestyle and preventive care can promote health and wellness. Preventive health guidelines for adults include the following key practices.  . A routine yearly physical is a good way to check with your health care provider about your health and preventive screening. It is a chance to share any concerns and updates on your health and to receive a thorough exam.  . Visit your dentist for a routine exam and preventive care every 6 months. Brush your teeth twice a day and floss once a day. Good oral hygiene prevents tooth decay and gum disease.  . The frequency of eye exams is based on your age, health, family medical history, use  of contact lenses, and other factors. Follow your health care provider's recommendations for frequency of eye exams.  . Eat a healthy diet. Foods like vegetables, fruits, whole grains, low-fat dairy products, and lean protein foods  contain the nutrients you need without too many calories. Decrease your intake of foods high in solid fats, added sugars, and salt. Eat the right amount of calories for you. Get information about a proper diet from your health care provider, if necessary.  . Regular physical exercise is one of the most important things you can do for your health. Most adults should get at least 150 minutes of moderate-intensity exercise (any activity that increases your heart rate and causes you to sweat) each week. In addition, most adults need muscle-strengthening exercises on 2 or more days a week.  Silver Sneakers may be a benefit available to you. To determine eligibility, you may visit the website: www.silversneakers.com or contact program at (779)572-9380 Mon-Fri between 8AM-8PM.   . Maintain a healthy weight. The body mass index (BMI) is a screening tool to identify possible weight problems. It provides an estimate of body fat based on height and weight. Your health care provider can find your BMI and can help you achieve or maintain a healthy weight.   For adults 20 years and older: ? A BMI below 18.5 is considered underweight. ? A BMI of 18.5 to 24.9 is normal. ? A BMI of 25 to 29.9 is considered overweight. ? A BMI of 30 and above is considered obese.   . Maintain normal blood lipids and cholesterol levels by exercising and minimizing your intake  of saturated fat. Eat a balanced diet with plenty of fruit and vegetables. Blood tests for lipids and cholesterol should begin at age 62 and be repeated every 5 years. If your lipid or cholesterol levels are high, you are over 50, or you are at high risk for heart disease, you may need your cholesterol levels checked more frequently. Ongoing high lipid and cholesterol levels should be treated with medicines if diet and exercise are not working.  . If you smoke, find out from your health care provider how to quit. If you do not use tobacco, please do not  start.  . If you choose to drink alcohol, please do not consume more than 2 drinks per day. One drink is considered to be 12 ounces (355 mL) of beer, 5 ounces (148 mL) of wine, or 1.5 ounces (44 mL) of liquor.  . If you are 52-35 years old, ask your health care provider if you should take aspirin to prevent strokes.  . Use sunscreen. Apply sunscreen liberally and repeatedly throughout the day. You should seek shade when your shadow is shorter than you. Protect yourself by wearing long sleeves, pants, a wide-brimmed hat, and sunglasses year round, whenever you are outdoors.  . Once a month, do a whole body skin exam, using a mirror to look at the skin on your back. Tell your health care provider of new moles, moles that have irregular borders, moles that are larger than a pencil eraser, or moles that have changed in shape or color.

## 2018-05-27 NOTE — Addendum Note (Signed)
Addended by: Ria Bush on: 05/27/2018 07:25 AM   Modules accepted: Orders

## 2018-06-02 NOTE — Progress Notes (Signed)
I reviewed health advisor's note, was available for consultation, and agree with documentation and plan.  

## 2018-06-03 ENCOUNTER — Encounter: Payer: Self-pay | Admitting: Family Medicine

## 2018-06-03 ENCOUNTER — Ambulatory Visit (INDEPENDENT_AMBULATORY_CARE_PROVIDER_SITE_OTHER): Payer: Medicare HMO | Admitting: Family Medicine

## 2018-06-03 VITALS — BP 132/78 | HR 84 | Temp 98.3°F | Ht 70.25 in | Wt 235.2 lb

## 2018-06-03 DIAGNOSIS — E66811 Obesity, class 1: Secondary | ICD-10-CM

## 2018-06-03 DIAGNOSIS — I10 Essential (primary) hypertension: Secondary | ICD-10-CM | POA: Diagnosis not present

## 2018-06-03 DIAGNOSIS — E538 Deficiency of other specified B group vitamins: Secondary | ICD-10-CM

## 2018-06-03 DIAGNOSIS — E1169 Type 2 diabetes mellitus with other specified complication: Secondary | ICD-10-CM | POA: Diagnosis not present

## 2018-06-03 DIAGNOSIS — E669 Obesity, unspecified: Secondary | ICD-10-CM | POA: Diagnosis not present

## 2018-06-03 DIAGNOSIS — Z0000 Encounter for general adult medical examination without abnormal findings: Secondary | ICD-10-CM | POA: Diagnosis not present

## 2018-06-03 DIAGNOSIS — Z7189 Other specified counseling: Secondary | ICD-10-CM | POA: Diagnosis not present

## 2018-06-03 DIAGNOSIS — M1A0710 Idiopathic chronic gout, right ankle and foot, without tophus (tophi): Secondary | ICD-10-CM

## 2018-06-03 DIAGNOSIS — G6289 Other specified polyneuropathies: Secondary | ICD-10-CM | POA: Diagnosis not present

## 2018-06-03 DIAGNOSIS — R972 Elevated prostate specific antigen [PSA]: Secondary | ICD-10-CM

## 2018-06-03 DIAGNOSIS — I77810 Thoracic aortic ectasia: Secondary | ICD-10-CM | POA: Diagnosis not present

## 2018-06-03 DIAGNOSIS — K219 Gastro-esophageal reflux disease without esophagitis: Secondary | ICD-10-CM | POA: Diagnosis not present

## 2018-06-03 DIAGNOSIS — E785 Hyperlipidemia, unspecified: Secondary | ICD-10-CM

## 2018-06-03 MED ORDER — COENZYME Q10 50 MG PO CAPS: 1.0000 | ORAL_CAPSULE | Freq: Every day | ORAL | 0 refills | Status: DC

## 2018-06-03 MED ORDER — ROSUVASTATIN CALCIUM 5 MG PO TABS: 5.0000 mg | ORAL_TABLET | Freq: Every day | ORAL | 3 refills | Status: DC

## 2018-06-03 NOTE — Progress Notes (Signed)
BP 132/78 (BP Location: Left Arm, Patient Position: Sitting, Cuff Size: Normal)   Pulse 84   Temp 98.3 F (36.8 C) (Oral)   Ht 5' 10.25" (1.784 m)   Wt 235 lb 4 oz (106.7 kg)   SpO2 94%   BMI 33.52 kg/m    CC: CPE Subjective:    Patient ID: Devin Huffman, male    DOB: 1945/12/20, 72 y.o.   MRN: [id number]  HPI: Devin Huffman is a 72 y.o. male presenting on 06/03/2018 for Annual Exam (Pt 2.  Pt accompanied by wife.)   Several gout flares recently - foot and knee, saw Dr Lorelei Pont for foot gout attack. Colchicine was too expensive, treated with prednisone then indocin. Not on daily gout lowering medication.   Ongoing paresthesias of feet.   Saw Lesia last week for medicare wellness visit. Note reviewed.   Preventative: Colon cancer screening - Dr. Fuller Plan. 03/2011; 2 polyps - tubular adenoma, rpt 5 yrs. COLONOSCOPY 01/2018 diverticulosis, rpt 5 yrs Fuller Plan) Prostate cancer screening - discussed. PSA yearly, DRE every few years. Mild nocturia, strong stream.  Flu shot yearly Tetanus 2010 Pneumovax 2013, prevnar 2015 zostavax 10/13/2013. Shingrix - discussed, not currently interested  Advanced directives: does not want prolonged life support. [place] for reversible cause. Wife would be HCPOA. Packet previously provided Seat belt use discussed Sunscreen use discussed. No changing moles on skin. 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 No regular exercise.  Diet: "I love my ice cream. I can't live forever", some water   Relevant past medical, surgical, family and social history reviewed and updated as indicated. Interim medical history since our last visit reviewed. Allergies and medications reviewed and updated. Outpatient Medications Prior to Visit  Medication Sig Dispense Refill   . amLODipine (NORVASC) 5 MG tablet Take 1 tablet (5 mg total) by mouth at bedtime. 90 tablet 3  . aspirin EC 81 MG tablet Take 1 tablet (81 mg total) by mouth daily.    . B Complex-Folic Acid (B COMPLEX-VITAMIN B12 PO) Take 1 tablet by mouth daily.    . Cholecalciferol (VITAMIN D3) 20 MCG TABS Take 25 mcg by mouth daily at 6 (six) AM.    . indomethacin (INDOCIN) 50 MG capsule TAKE 1 CAPSULE BY MOUTH THREE TIMES DAILY AS NEEDED 30 capsule 0  . metoprolol succinate (TOPROL-XL) 25 MG 24 hr tablet Take 1 tablet (25 mg total) by mouth at bedtime. 90 tablet 0  . Omega-3 Fatty Acids (FISH OIL) 1000 MG CAPS Take 2 capsules (2,000 mg total) by mouth daily.  0  . omeprazole (PRILOSEC) 20 MG capsule TAKE ONE CAPSULE BY MOUTH ONCE DAILY AS NEEDED 90 capsule 0  . vitamin C (ASCORBIC ACID) 500 MG tablet Take 500 mg by mouth 2 (two) times daily.    . Cyanocobalamin (VITAMIN B 12 PO) Take by mouth daily at 6 (six) AM.     No facility-administered medications prior to visit.      Per HPI unless specifically indicated in ROS section below Review of Systems  Constitutional: Negative for activity change, appetite change, chills, fatigue, fever and unexpected weight change.  HENT: Negative for hearing loss.   Eyes: Negative for visual disturbance.  Respiratory: Negative for cough, chest tightness, shortness of breath and wheezing.   Cardiovascular: Negative  for chest pain, palpitations and leg swelling.  Gastrointestinal: Negative for abdominal distention, abdominal pain, blood in stool, constipation, diarrhea, nausea and vomiting.  Genitourinary: Negative for difficulty urinating and hematuria.  Musculoskeletal: Negative for arthralgias, myalgias and neck pain.  Skin: Negative for rash.  Neurological: Negative for dizziness, seizures, syncope and headaches.  Hematological: Negative for adenopathy. Does not bruise/bleed easily.  Psychiatric/Behavioral: Negative for dysphoric mood. The patient is not  nervous/anxious.        Objective:    BP 132/78 (BP Location: Left Arm, Patient Position: Sitting, Cuff Size: Normal)   Pulse 84   Temp 98.3 F (36.8 C) (Oral)   Ht 5' 10.25" (1.784 m)   Wt 235 lb 4 oz (106.7 kg)   SpO2 94%   BMI 33.52 kg/m   Wt Readings from Last 3 Encounters:  06/03/18 235 lb 4 oz (106.7 kg)  05/27/18 213 lb 8 oz (96.8 kg)  04/17/18 233 lb 12 oz (106 kg)    Physical Exam  Constitutional: He is oriented to person, place, and time. He appears well-developed and well-nourished. No distress.  HENT:  Head: Normocephalic and atraumatic.  Right Ear: Hearing, tympanic membrane, external ear and ear canal normal.  Left Ear: Hearing, tympanic membrane, external ear and ear canal normal.  Nose: Nose normal.  Mouth/Throat: Uvula is midline, oropharynx is clear and moist and mucous membranes are normal. No oropharyngeal exudate, posterior oropharyngeal edema or posterior oropharyngeal erythema.  Eyes: Pupils are equal, round, and reactive to light. Conjunctivae and EOM are normal. No scleral icterus.  Neck: Normal range of motion. Neck supple. Carotid bruit is not present. No thyromegaly present.  Cardiovascular: Normal rate, regular rhythm, normal heart sounds and intact distal pulses.  No murmur heard. Pulses:      Radial pulses are 2+ on the right side, and 2+ on the left side.  Pulmonary/Chest: Effort normal and breath sounds normal. No respiratory distress. He has no wheezes. He has no rales.  Abdominal: Soft. Bowel sounds are normal. He exhibits no distension and no mass. There is no tenderness. There is no rebound and no guarding.  Genitourinary: Rectum normal and prostate normal. Rectal exam shows no external hemorrhoid, no internal hemorrhoid, no fissure, no mass, no tenderness and anal tone normal. Prostate is not enlarged (20gm) and not tender.  Musculoskeletal: Normal range of motion. He exhibits no edema.  Lymphadenopathy:    He has no cervical adenopathy.    Neurological: He is alert and oriented to person, place, and time.  CN grossly intact, station and gait intact  Skin: Skin is warm and dry. No rash noted.  Psychiatric: He has a normal mood and affect. His behavior is normal. Judgment and thought content normal.  Nursing note and vitals reviewed.  Results for orders placed or performed in visit on 05/27/18  Microalbumin / creatinine urine ratio  Result Value Ref Range   Microalb, Ur <0.7 0.0 - 1.9 mg/dL   Creatinine,U 113.0 mg/dL   Microalb Creat Ratio 0.6 0.0 - 30.0 mg/g  Vitamin B12  Result Value Ref Range   Vitamin B-12 388 211 - 911 pg/mL  Uric acid  Result Value Ref Range   Uric Acid, Serum 9.6 (H) 4.0 - 7.8 mg/dL  PSA  Result Value Ref Range   PSA 1.61 0.10 - 4.00 ng/mL  Hemoglobin A1c  Result Value Ref Range   Hgb A1c MFr Bld 6.7 (H) 4.6 - 6.5 %  Comprehensive metabolic panel  Result Value Ref Range  Sodium 140 135 - 145 mEq/L   Potassium 4.4 3.5 - 5.1 mEq/L   Chloride 105 96 - 112 mEq/L   CO2 27 19 - 32 mEq/L   Glucose, Bld 129 (H) 70 - 99 mg/dL   BUN 21 6 - 23 mg/dL   Creatinine, Ser 0.91 0.40 - 1.50 mg/dL   Total Bilirubin 0.6 0.2 - 1.2 mg/dL   Alkaline Phosphatase 81 39 - 117 U/L   AST 18 0 - 37 U/L   ALT 27 0 - 53 U/L   Total Protein 6.7 6.0 - 8.3 g/dL   Albumin 4.2 3.5 - 5.2 g/dL   Calcium 9.2 8.4 - 10.5 mg/dL   GFR 86.66 >60.00 mL/min  Lipid panel  Result Value Ref Range   Cholesterol 254 (H) 0 - 200 mg/dL   Triglycerides 379.0 (H) 0.0 - 149.0 mg/dL   HDL 41.50 >39.00 mg/dL   VLDL 75.8 (H) 0.0 - 40.0 mg/dL   Total CHOL/HDL Ratio 6    NonHDL 212.19   LDL cholesterol, direct  Result Value Ref Range   Direct LDL 138.0 mg/dL      Assessment & Plan:   Problem List Items Addressed This Visit    Vitamin B12 deficiency    He takes B complex daily. b12 levels dropping - will need ongoing monitoring.       Type 2 diabetes mellitus with other specified complication (HCC)    Y1O has progressed to  diabetes range (controlled). Reviewed with patient. Encouraged limiting sugar/carbs and following diabetic diet. RTC 6 mo f/u visit.       Relevant Medications   rosuvastatin (CRESTOR) 5 MG tablet   Thoracic aortic ectasia (HCC)    Mildly dilated aortic root and LA by echo 02/2017. Continue tight blood pressure control.      Relevant Medications   rosuvastatin (CRESTOR) 5 MG tablet   Peripheral neuropathy    Continue b12 replacement.  SPEP normal 2017      Obesity, Class I, BMI 30-34.9    Reviewed healthy diet and lifestyle changes to affect sustainable weight loss.       Increased prostate specific antigen (PSA) velocity    PSA/DRE stable.       Health maintenance examination - Primary    Preventative protocols reviewed and updated unless pt declined. Discussed healthy diet and lifestyle.       Gout    Reviewed acute gout treatment. Declines daily urate lowering medication.       GERD (gastroesophageal reflux disease)    Chronic, stable on daily low dose PPI.       Essential hypertension    Chronic, stable. Continue current regimen.       Relevant Medications   rosuvastatin (CRESTOR) 5 MG tablet   Dyslipidemia    Chronic, not on statin. Discussed - agrees to trial crestor with coq10.  Continues fish oil. Discussed fatty fish in diet.  The 10-year ASCVD risk score Mikey Bussing DC Brooke Bonito., et al., 2013) is: 50.7%   Values used to calculate the score:     Age: 57 years     Sex: Male     Is Non-Hispanic African American: No     Diabetic: Yes     Tobacco smoker: No     Systolic Blood Pressure: 175 mmHg     Is BP treated: Yes     HDL Cholesterol: 41.5 mg/dL     Total Cholesterol: 254 mg/dL       Relevant Medications  rosuvastatin (CRESTOR) 5 MG tablet   Advanced care planning/counseling discussion    Advanced directives: does not want prolonged life support. [place] for reversible cause. Wife would be HCPOA. Packet previously provided          Meds ordered this  encounter  Medications  . rosuvastatin (CRESTOR) 5 MG tablet    Sig: Take 1 tablet (5 mg total) by mouth daily.    Dispense:  30 tablet    Refill:  3  . Coenzyme Q10 50 MG CAPS    Sig: Take 1 capsule (50 mg total) by mouth daily.    Refill:  0   No orders of the defined types were placed in this encounter.   Follow up plan: Return in about 6 months (around 12/03/2018) for follow up visit.  Ria Bush, MD

## 2018-06-03 NOTE — Assessment & Plan Note (Signed)
Preventative protocols reviewed and updated unless pt declined. Discussed healthy diet and lifestyle.  

## 2018-06-03 NOTE — Assessment & Plan Note (Signed)
Chronic, not on statin. Discussed - agrees to trial crestor with coq10.  Continues fish oil. Discussed fatty fish in diet.  The 10-year ASCVD risk score Devin Huffman Devin Huffman., et al., 2013) is: 50.7%   Values used to calculate the score:     Age: 72 years     Sex: Male     Is Non-Hispanic African American: No     Diabetic: Yes     Tobacco smoker: No     Systolic Blood Pressure: 944 mmHg     Is BP treated: Yes     HDL Cholesterol: 41.5 mg/dL     Total Cholesterol: 254 mg/dL

## 2018-06-03 NOTE — Patient Instructions (Addendum)
If interested, check with pharmacy about new 2 shot shingles series (shingrix).  Over next 6 months focus on healthy diet changes - less added sugar, less simple carbs, more vegetables. Limit purine rich foods to help prevent gout flares.  Cholesterol levels were high - trial crestor 5mg  daily in addition to co enzyme Q10 50mg  daily over the counter.  Return in 6 months for follow up visit.   Health Maintenance, Male A healthy lifestyle and preventive care is important for your health and wellness. Ask your health care provider about what schedule of regular examinations is right for you. What should I know about weight and diet? Eat a Healthy Diet  Eat plenty of vegetables, fruits, whole grains, low-fat dairy products, and lean protein.  Do not eat a lot of foods high in solid fats, added sugars, or salt.  Maintain a Healthy Weight Regular exercise can help you achieve or maintain a healthy weight. You should:  Do at least 150 minutes of exercise each week. The exercise should increase your heart rate and make you sweat (moderate-intensity exercise).  Do strength-training exercises at least twice a week.  Watch Your Levels of Cholesterol and Blood Lipids  Have your blood tested for lipids and cholesterol every 5 years starting at 74 years of age. If you are at high risk for heart disease, you should start having your blood tested when you are 74 years old. You may need to have your cholesterol levels checked more often if: ? Your lipid or cholesterol levels are high. ? You are older than 74 years of age. ? You are at high risk for heart disease.  What should I know about cancer screening? Many types of cancers can be detected early and may often be prevented. Lung Cancer  You should be screened every year for lung cancer if: ? You are a current smoker who has smoked for at least 30 years. ? You are a former smoker who has quit within the past 15 years.  Talk to your health care  provider about your screening options, when you should start screening, and how often you should be screened.  Colorectal Cancer  Routine colorectal cancer screening usually begins at 74 years of age and should be repeated every 5-10 years until you are 74 years old. You may need to be screened more often if early forms of precancerous polyps or small growths are found. Your health care provider may recommend screening at an earlier age if you have risk factors for colon cancer.  Your health care provider may recommend using home test kits to check for hidden blood in the stool.  A small camera at the end of a tube can be used to examine your colon (sigmoidoscopy or colonoscopy). This checks for the earliest forms of colorectal cancer.  Prostate and Testicular Cancer  Depending on your age and overall health, your health care provider may do certain tests to screen for prostate and testicular cancer.  Talk to your health care provider about any symptoms or concerns you have about testicular or prostate cancer.  Skin Cancer  Check your skin from head to toe regularly.  Tell your health care provider about any new moles or changes in moles, especially if: ? There is a change in a mole's size, shape, or color. ? You have a mole that is larger than a pencil eraser.  Always use sunscreen. Apply sunscreen liberally and repeat throughout the day.  Protect yourself by wearing long sleeves,  pants, a wide-brimmed hat, and sunglasses when outside.  What should I know about heart disease, diabetes, and high blood pressure?  If you are 72-52 years of age, have your blood pressure checked every 3-5 years. If you are 49 years of age or older, have your blood pressure checked every year. You should have your blood pressure measured twice-once when you are at a hospital or clinic, and once when you are not at a hospital or clinic. Record the average of the two measurements. To check your blood pressure  when you are not at a hospital or clinic, you can use: ? An automated blood pressure machine at a pharmacy. ? A home blood pressure monitor.  Talk to your health care provider about your target blood pressure.  If you are between 33-9 years old, ask your health care provider if you should take aspirin to prevent heart disease.  Have regular diabetes screenings by checking your fasting blood sugar level. ? If you are at a normal weight and have a low risk for diabetes, have this test once every three years after the age of 62. ? If you are overweight and have a high risk for diabetes, consider being tested at a younger age or more often.  A one-time screening for abdominal aortic aneurysm (AAA) by ultrasound is recommended for men aged 63-75 years who are current or former smokers. What should I know about preventing infection? Hepatitis B If you have a higher risk for hepatitis B, you should be screened for this virus. Talk with your health care provider to find out if you are at risk for hepatitis B infection. Hepatitis C Blood testing is recommended for:  Everyone born from [birth year] through 1965.  Anyone with known risk factors for hepatitis C.  Sexually Transmitted Diseases (STDs)  You should be screened each year for STDs including gonorrhea and chlamydia if: ? You are sexually active and are younger than 74 years of age. ? You are older than 74 years of age and your health care provider tells you that you are at risk for this type of infection. ? Your sexual activity has changed since you were last screened and you are at an increased risk for chlamydia or gonorrhea. Ask your health care provider if you are at risk.  Talk with your health care provider about whether you are at high risk of being infected with HIV. Your health care provider may recommend a prescription medicine to help prevent HIV infection.  What else can I do?  Schedule regular health, dental, and eye  exams.  Stay current with your vaccines (immunizations).  Do not use any tobacco products, such as cigarettes, chewing tobacco, and e-cigarettes. If you need help quitting, ask your health care provider.  Limit alcohol intake to no more than 2 drinks per day. One drink equals 12 ounces of beer, 5 ounces of wine, or 1 ounces of hard liquor.  Do not use street drugs.  Do not share needles.  Ask your health care provider for help if you need support or information about quitting drugs.  Tell your health care provider if you often feel depressed.  Tell your health care provider if you have ever been abused or do not feel safe at home. This information is not intended to replace advice given to you by your health care provider. Make sure you discuss any questions you have with your health care provider. Document Released: 05/25/2008 Document Revised: 07/26/2016 Document Reviewed: 08/31/2015 Elsevier  Interactive Patient Education  [employer].

## 2018-06-04 NOTE — Assessment & Plan Note (Signed)
Chronic, stable. Continue current regimen. 

## 2018-06-04 NOTE — Assessment & Plan Note (Signed)
Chronic, stable on daily low dose PPI.

## 2018-06-04 NOTE — Assessment & Plan Note (Signed)
Mildly dilated aortic root and LA by echo 02/2017. Continue tight blood pressure control.

## 2018-06-04 NOTE — Assessment & Plan Note (Signed)
Reviewed acute gout treatment. Declines daily urate lowering medication.

## 2018-06-04 NOTE — Assessment & Plan Note (Signed)
PSA/DRE stable.

## 2018-06-04 NOTE — Assessment & Plan Note (Signed)
He takes B complex daily. b12 levels dropping - will need ongoing monitoring.

## 2018-06-04 NOTE — Assessment & Plan Note (Signed)
Reviewed healthy diet and lifestyle changes to affect sustainable weight loss.  

## 2018-06-04 NOTE — Assessment & Plan Note (Addendum)
Continue b12 replacement.  SPEP normal 2017

## 2018-06-04 NOTE — Assessment & Plan Note (Signed)
A1c has progressed to diabetes range (controlled). Reviewed with patient. Encouraged limiting sugar/carbs and following diabetic diet. RTC 6 mo f/u visit.

## 2018-06-04 NOTE — Assessment & Plan Note (Signed)
Advanced directives: does not want prolonged life support. [place] for reversible cause. Wife would be HCPOA. Packet previously provided

## 2018-07-22 ENCOUNTER — Other Ambulatory Visit: Payer: Self-pay | Admitting: Family Medicine

## 2018-08-22 DIAGNOSIS — R69 Illness, unspecified: Secondary | ICD-10-CM | POA: Diagnosis not present

## 2018-09-29 ENCOUNTER — Other Ambulatory Visit: Payer: Self-pay | Admitting: Family Medicine

## 2018-09-30 IMAGING — DX DG CHEST 1V PORT
1 series · 1 of 1 positions shown · non-contrast
Comparison: 02/22/2017 chest radiograph

CLINICAL DATA: 70 y/o  M; fever and hypoxia.

EXAM:
PORTABLE CHEST 1 VIEW

[chest ap]
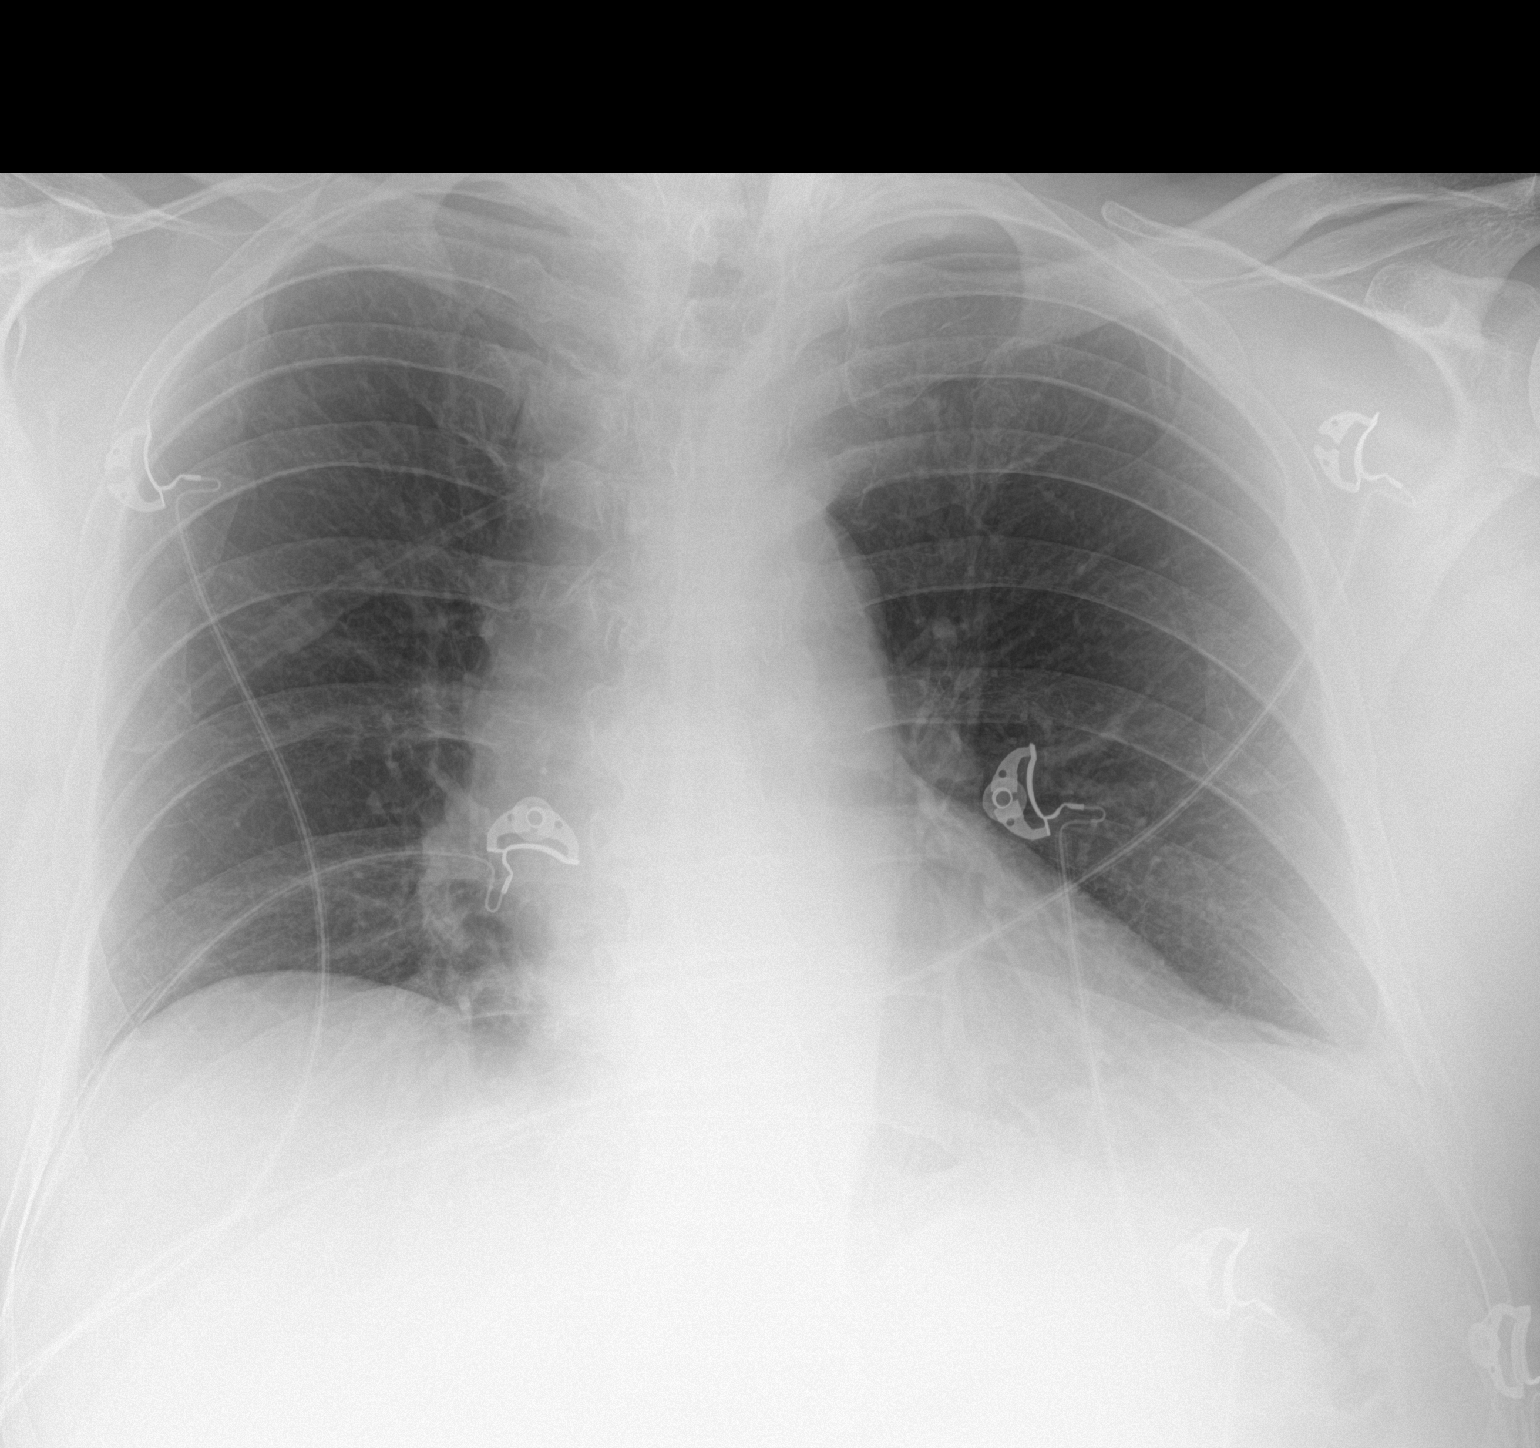

[1 of 1 positions shown; findings below may reference images not displayed]

FINDINGS: Stable heart size and mediastinal contours are within normal limits
given projection and technique. Both lungs are clear. Mild S-shaped
curvature of the spine.
IMPRESSION: No active disease.

By: Gus Caraveo M.D.

## 2018-10-09 ENCOUNTER — Telehealth: Payer: Self-pay

## 2018-10-09 NOTE — Telephone Encounter (Signed)
Noted, will see then.  Thanks.

## 2018-10-09 NOTE — Telephone Encounter (Signed)
Devin Huffman (DPR signed) said pt fell 1 yr ago and injured rt hand; pt did not see anyone about hand at that time; progressively burning pain in rt thumb now radiating to the right wrist on and off. pts grip is affected. pts wife scheduled appt with Dr Damita Dunnings on 10/11/18 at 10:30. Offered pt sooner appt but declined.FYI to Dr Damita Dunnings.

## 2018-10-11 ENCOUNTER — Encounter: Payer: Self-pay | Admitting: Family Medicine

## 2018-10-11 ENCOUNTER — Ambulatory Visit (INDEPENDENT_AMBULATORY_CARE_PROVIDER_SITE_OTHER): Payer: Medicare HMO | Admitting: Family Medicine

## 2018-10-11 VITALS — BP 144/64 | HR 89 | Temp 98.6°F | Ht 70.25 in | Wt 236.5 lb

## 2018-10-11 DIAGNOSIS — M79646 Pain in unspecified finger(s): Secondary | ICD-10-CM

## 2018-10-11 NOTE — Progress Notes (Signed)
R hand pain.  Pain on dorsal R1st MC area. Burning pain, with grip. Doesn't throb.  Can radial proximally.   [place] about 1 year ago.  Sx started in the last week.  He does not have year-long symptoms.  He has mild left-sided sore throat recently noted.  No fevers.  No other systemic symptoms.  Meds, vitals, and allergies reviewed.   ROS: Per HPI unless specifically indicated in ROS section   nad ncat OP wnl, MMM Neck supple, no LA rrr Right hand with normal inspection.  Wrist is nontender.  No focal tenderness or erythema/swelling at the MCPs, IP joints.  No deficit on testing of tendons but [facility] [place] testing is positive on the thumb.  Distally neurovascular intact.

## 2018-10-11 NOTE — Patient Instructions (Signed)
Likely thumb tendonitis.   Get a thumb spica brace.  You can get them over the counter.   Use ice for 5 minutes at a time.   Use that and update [place] if not better.  Take care.  Glad to see you.

## 2018-10-13 DIAGNOSIS — M25531 Pain in right wrist: Secondary | ICD-10-CM | POA: Insufficient documentation

## 2018-10-13 NOTE — Assessment & Plan Note (Signed)
Likely tendinitis.  See after visit summary.  Reasonable to use a spica brace.  Can ice as needed.  Routine cautions discussed with patient.  Anatomy discussed with patient.  Sore throat recently noted but exam is benign.  Reassured.  Update [place] as needed.  No ominous findings.  Likely unrelated.

## 2018-10-16 DIAGNOSIS — R69 Illness, unspecified: Secondary | ICD-10-CM | POA: Diagnosis not present

## 2018-11-27 ENCOUNTER — Telehealth: Payer: Self-pay

## 2018-11-27 NOTE — Telephone Encounter (Signed)
pts wife DPR signed called to see what appt on 11/29/18 at 7:15 was for; advised 6 mth fu for DM. Hassan Rowan voiced understanding and pt has breakfast planned at 6 AM that same day. Advised per 05/2019 note that appt was for f/u visit and if needed A1C pt does not have to fast. pts wife voiced understanding and will be in office 5 - 10 mins early to get checked in.

## 2018-11-28 ENCOUNTER — Encounter: Payer: Self-pay | Admitting: Family Medicine

## 2018-11-28 NOTE — Progress Notes (Signed)
BP 128/68 (BP Location: Left Arm, Patient Position: Sitting, Cuff Size: Large)   Pulse 69   Temp 97.8 F (36.6 C) (Oral)   Ht 5' 10.25" (1.784 m)   Wt 235 lb (106.6 kg)   SpO2 94%   BMI 33.48 kg/m    CC: 49mo DM f/u visit  Subjective:    Patient ID: Devin Huffman, male    DOB: January 05, 1946, 72 y.o.   MRN: [id number]  HPI: Devin Huffman is a 72 y.o. male presenting on 11/29/2018 for Diabetes (Here for 6 mo f/u. )   Lost sister 12/2017 to stage 4 lung cancer  Saw Dr Damita Dunnings last month for thumb pain thought tendonitis, treated with thumb spica brace with some benefit. Brace caused some irritation to thumb. Hasn't tried anything for this. Declines referral for injection.   DM - does not regularly check sugars. Compliant with antihyperglycemic regimen which includes: diet controlled. Denies low sugars or hypoglycemic symptoms. Ongoing foot paresthesias with some burning discomfort. Last diabetic eye exam 03/2018. Pneumovax: 2013. Prevnar: 2015. Glucometer brand: does not have at home. DSME: declines.  Lab Results  Component Value Date   HGBA1C 6.5 (A) 11/29/2018   Diabetic Foot Exam - Simple   Simple Foot Form Diabetic Foot exam was performed with the following findings:  Yes 11/29/2018  7:36 AM  Visual Inspection No deformities, no ulcerations, no other skin breakdown bilaterally:  Yes Sensation Testing Intact to touch and monofilament testing bilaterally:  Yes Pulse Check Posterior Tibialis and Dorsalis pulse intact bilaterally:  Yes Comments Some callus bilateral medial great toes Toenail thickening bilaterally    Lab Results  Component Value Date   MICROALBUR <0.7 05/27/2018     HLD - last visit we started crestor with CoQ 10 in addition to fish oil he was taking. Tolerating this better than prior statin.   Relevant past medical, surgical, family and social history reviewed and updated as indicated. Interim medical history since our last visit reviewed. Allergies and  medications reviewed and updated. Outpatient Medications Prior to Visit  Medication Sig Dispense Refill  . amLODipine (NORVASC) 5 MG tablet Take 1 tablet (5 mg total) by mouth at bedtime. 90 tablet 3  . aspirin EC 81 MG tablet Take 1 tablet (81 mg total) by mouth daily.    . B Complex-Folic Acid (B COMPLEX-VITAMIN B12 PO) Take 1 tablet by mouth daily.    . Cholecalciferol (VITAMIN D3) 20 MCG TABS Take 25 mcg by mouth daily at 6 (six) AM.    . Coenzyme Q10 50 MG CAPS Take 1 capsule (50 mg total) by mouth daily.  0  . indomethacin (INDOCIN) 50 MG capsule TAKE 1 CAPSULE BY MOUTH THREE TIMES DAILY AS NEEDED (Patient taking differently: TAKE 1 CAPSULE BY MOUTH THREE TIMES DAILY AS NEEDED.) 30 capsule 0  . metoprolol succinate (TOPROL-XL) 25 MG 24 hr tablet TAKE 1 TABLET BY MOUTH AT BEDTIME 90 tablet 1  . Omega-3 Fatty Acids (FISH OIL) 1000 MG CAPS Take 2 capsules (2,000 mg total) by mouth daily.  0  . omeprazole (PRILOSEC) 20 MG capsule TAKE ONE CAPSULE BY MOUTH ONCE DAILY AS NEEDED 90 capsule 0  . rosuvastatin (CRESTOR) 5 MG tablet TAKE 1 TABLET BY MOUTH ONCE DAILY 30 tablet 2  . vitamin C (ASCORBIC ACID) 500 MG tablet Take 500 mg by mouth daily.      No facility-administered medications prior to visit.      Per HPI unless specifically indicated in ROS  section below Review of Systems     Objective:    BP 128/68 (BP Location: Left Arm, Patient Position: Sitting, Cuff Size: Large)   Pulse 69   Temp 97.8 F (36.6 C) (Oral)   Ht 5' 10.25" (1.784 m)   Wt 235 lb (106.6 kg)   SpO2 94%   BMI 33.48 kg/m   Wt Readings from Last 3 Encounters:  11/29/18 235 lb (106.6 kg)  10/11/18 236 lb 8 oz (107.3 kg)  06/03/18 235 lb 4 oz (106.7 kg)    Physical Exam Vitals signs and nursing note reviewed.  Constitutional:      General: He is not in acute distress.    Appearance: He is well-developed.  HENT:     Head: Normocephalic and atraumatic.     Right Ear: External ear normal.     Left Ear:  External ear normal.     Nose: Nose normal.     Mouth/Throat:     Pharynx: No oropharyngeal exudate.  Eyes:     General: No scleral icterus.    Conjunctiva/sclera: Conjunctivae normal.     Pupils: Pupils are equal, round, and reactive to light.  Neck:     Musculoskeletal: Normal range of motion and neck supple.  Cardiovascular:     Rate and Rhythm: Normal rate and regular rhythm.     Heart sounds: Normal heart sounds. No murmur.  Pulmonary:     Effort: Pulmonary effort is normal. No respiratory distress.     Breath sounds: Normal breath sounds. No wheezing or rales.  Musculoskeletal: Normal range of motion.        General: Tenderness present.     Comments: See HPI for foot exam if done L hand WNL R hand: + finkelstein, tender to palpation at R thumb extensor tendon, no pain at scaphoid Slight deformity of thumb from prior injury 1+ yrs ago.  Lymphadenopathy:     Cervical: No cervical adenopathy.  Skin:    General: Skin is warm and dry.     Findings: No rash.    Results for orders placed or performed in visit on 11/29/18  POCT glycosylated hemoglobin (Hb A1C)  Result Value Ref Range   Hemoglobin A1C 6.5 (A) 4.0 - 5.6 %   HbA1c POC (<> result, manual entry)     HbA1c, POC (prediabetic range)     HbA1c, POC (controlled diabetic range)    POCT Urinalysis Dipstick (Automated)  Result Value Ref Range   Color, UA gold    Clarity, UA clear    Glucose, UA Negative Negative   Bilirubin, UA negative    Ketones, UA +/-    Spec Grav, UA >=1.030 (A) 1.010 - 1.025   Blood, UA negative    pH, UA 5.5 5.0 - 8.0   Protein, UA Negative Negative   Urobilinogen, UA 0.2 0.2 or 1.0 E.U./dL   Nitrite, UA negative    Leukocytes, UA Small (1+) (A) Negative      Assessment & Plan:   Problem List Items Addressed This Visit    Vitamin B12 deficiency    Continues b complex vitamin daily.        Type 2 diabetes mellitus with other specified complication ([place]) - Primary    Does not check  sugars. Will update A1c today.  Discussed possible referral for diabetes education.       Relevant Orders   POCT glycosylated hemoglobin (Hb A1C) (Completed)   POCT Urinalysis Dipstick (Automated) (Completed)   Right wrist pain  No pain at scaphoid.  Anticipate R dequervain's tenosynovitis. Offered referral to SM to discuss possible steroid injection - he declines at this time.  Suggested PRN NSAID.       Hematuria    H/o this last year, was hospitalized with presumed urosepsis after joint replacement surgery, while on xarelto post op. Rpt UA with cleared hematuria. Will update UA today.       Dyslipidemia    Tolerating crestor well - will check dLDL today.       Relevant Orders   LDL Cholesterol, Direct       No orders of the defined types were placed in this encounter.  Orders Placed This Encounter  Procedures  . LDL Cholesterol, Direct  . POCT glycosylated hemoglobin (Hb A1C)  . POCT Urinalysis Dipstick (Automated)    Follow up plan: Return in about 6 months (around 05/31/2019) for annual exam, prior fasting for blood work, medicare wellness visit.  Ria Bush, MD

## 2018-11-29 ENCOUNTER — Encounter: Payer: Self-pay | Admitting: Family Medicine

## 2018-11-29 ENCOUNTER — Telehealth: Payer: Self-pay

## 2018-11-29 ENCOUNTER — Ambulatory Visit (INDEPENDENT_AMBULATORY_CARE_PROVIDER_SITE_OTHER): Payer: Medicare HMO | Admitting: Family Medicine

## 2018-11-29 VITALS — BP 128/68 | HR 69 | Temp 97.8°F | Ht 70.25 in | Wt 235.0 lb

## 2018-11-29 DIAGNOSIS — R319 Hematuria, unspecified: Secondary | ICD-10-CM

## 2018-11-29 DIAGNOSIS — E1169 Type 2 diabetes mellitus with other specified complication: Secondary | ICD-10-CM | POA: Diagnosis not present

## 2018-11-29 DIAGNOSIS — E785 Hyperlipidemia, unspecified: Secondary | ICD-10-CM

## 2018-11-29 DIAGNOSIS — E538 Deficiency of other specified B group vitamins: Secondary | ICD-10-CM | POA: Diagnosis not present

## 2018-11-29 DIAGNOSIS — M25531 Pain in right wrist: Secondary | ICD-10-CM | POA: Diagnosis not present

## 2018-11-29 LAB — POC URINALSYSI DIPSTICK (AUTOMATED)
BILIRUBIN UA: NEGATIVE
Glucose, UA: NEGATIVE
NITRITE UA: NEGATIVE
PH UA: 5.5 (ref 5.0–8.0)
Protein, UA: NEGATIVE
RBC UA: NEGATIVE
Urobilinogen, UA: 0.2 E.U./dL

## 2018-11-29 LAB — POCT GLYCOSYLATED HEMOGLOBIN (HGB A1C): Hemoglobin A1C: 6.5 % — AB (ref 4.0–5.6)

## 2018-11-29 LAB — LDL CHOLESTEROL, DIRECT: LDL DIRECT: 70 mg/dL

## 2018-11-29 NOTE — Assessment & Plan Note (Addendum)
Continues b complex vitamin daily.

## 2018-11-29 NOTE — Assessment & Plan Note (Signed)
Tolerating crestor well - will check dLDL today.

## 2018-11-29 NOTE — Patient Instructions (Addendum)
Labs today.  You are doing well today.  Return as needed or in 6 months for follow up visit.  Let us know if you'd like referral for steroid shot for thumb.  Urinalysis today.

## 2018-11-29 NOTE — Assessment & Plan Note (Addendum)
H/o this last year, was hospitalized with presumed urosepsis after joint replacement surgery, while on xarelto post op. Rpt UA with cleared hematuria. Will update UA today.

## 2018-11-29 NOTE — Assessment & Plan Note (Signed)
Does not check sugars. Will update A1c today.  Discussed possible referral for diabetes education.

## 2018-11-29 NOTE — Telephone Encounter (Signed)
Pts' wife Hassan Rowan (on dpr) returned a call to [place].

## 2018-11-29 NOTE — Assessment & Plan Note (Signed)
No pain at scaphoid.  Anticipate R dequervain's tenosynovitis. Offered referral to SM to discuss possible steroid injection - he declines at this time.  Suggested PRN NSAID.

## 2018-11-29 NOTE — Telephone Encounter (Addendum)
Left message on vm for pt to call back.  Need to relay results and instructions per Dr. Darnell Level.    Results Urinalysis returned concentrated but overall ok - increase water intake. LDL (bad cholesterol) returned wonderful at 33 - continue crestor which is working great. A1c returned stable at 6.5% - recommend continued low sugar low carb diabetic diet.

## 2018-12-02 NOTE — Telephone Encounter (Signed)
Pt returned call for lab results.

## 2018-12-02 NOTE — Telephone Encounter (Signed)
Spoke with pt relaying results and instructions per Dr. G.  Pt verbalizes understanding.  

## 2018-12-30 ENCOUNTER — Other Ambulatory Visit: Payer: Self-pay | Admitting: Family Medicine

## 2019-01-16 ENCOUNTER — Other Ambulatory Visit: Payer: Self-pay | Admitting: Family Medicine

## 2019-04-14 ENCOUNTER — Other Ambulatory Visit: Payer: Self-pay | Admitting: Family Medicine

## 2019-04-21 ENCOUNTER — Other Ambulatory Visit: Payer: Self-pay | Admitting: Family Medicine

## 2019-05-28 ENCOUNTER — Other Ambulatory Visit: Payer: Self-pay | Admitting: Family Medicine

## 2019-05-28 DIAGNOSIS — I10 Essential (primary) hypertension: Secondary | ICD-10-CM

## 2019-05-28 DIAGNOSIS — M1A0710 Idiopathic chronic gout, right ankle and foot, without tophus (tophi): Secondary | ICD-10-CM

## 2019-05-28 DIAGNOSIS — E538 Deficiency of other specified B group vitamins: Secondary | ICD-10-CM

## 2019-05-28 DIAGNOSIS — R972 Elevated prostate specific antigen [PSA]: Secondary | ICD-10-CM

## 2019-05-28 DIAGNOSIS — E1169 Type 2 diabetes mellitus with other specified complication: Secondary | ICD-10-CM

## 2019-05-28 DIAGNOSIS — E785 Hyperlipidemia, unspecified: Secondary | ICD-10-CM

## 2019-05-29 ENCOUNTER — Other Ambulatory Visit: Payer: Self-pay

## 2019-05-29 ENCOUNTER — Other Ambulatory Visit: Payer: Medicare HMO

## 2019-06-05 ENCOUNTER — Encounter: Payer: Self-pay | Admitting: Family Medicine

## 2019-06-05 ENCOUNTER — Ambulatory Visit: Payer: Self-pay

## 2019-06-29 ENCOUNTER — Other Ambulatory Visit: Payer: Self-pay | Admitting: Family Medicine

## 2019-07-03 ENCOUNTER — Other Ambulatory Visit: Payer: Medicare HMO

## 2019-07-04 ENCOUNTER — Other Ambulatory Visit (INDEPENDENT_AMBULATORY_CARE_PROVIDER_SITE_OTHER): Payer: Medicare HMO

## 2019-07-04 DIAGNOSIS — E785 Hyperlipidemia, unspecified: Secondary | ICD-10-CM

## 2019-07-04 DIAGNOSIS — I10 Essential (primary) hypertension: Secondary | ICD-10-CM

## 2019-07-04 DIAGNOSIS — M1A0710 Idiopathic chronic gout, right ankle and foot, without tophus (tophi): Secondary | ICD-10-CM | POA: Diagnosis not present

## 2019-07-04 DIAGNOSIS — E538 Deficiency of other specified B group vitamins: Secondary | ICD-10-CM | POA: Diagnosis not present

## 2019-07-04 DIAGNOSIS — R972 Elevated prostate specific antigen [PSA]: Secondary | ICD-10-CM

## 2019-07-04 DIAGNOSIS — E1169 Type 2 diabetes mellitus with other specified complication: Secondary | ICD-10-CM | POA: Diagnosis not present

## 2019-07-04 LAB — COMPREHENSIVE METABOLIC PANEL
ALT: 33 U/L (ref 0–53)
AST: 22 U/L (ref 0–37)
Albumin: 4.3 g/dL (ref 3.5–5.2)
Alkaline Phosphatase: 80 U/L (ref 39–117)
BUN: 23 mg/dL (ref 6–23)
CO2: 28 mEq/L (ref 19–32)
Calcium: 9.3 mg/dL (ref 8.4–10.5)
Chloride: 105 mEq/L (ref 96–112)
Creatinine, Ser: 1.01 mg/dL (ref 0.40–1.50)
GFR: 72.07 mL/min (ref >60.00)
Glucose, Bld: 134 mg/dL — ABNORMAL HIGH (ref 70–99)
Potassium: 4.8 mEq/L (ref 3.5–5.1)
Sodium: 142 mEq/L (ref 135–145)
Total Bilirubin: 0.9 mg/dL (ref 0.2–1.2)
Total Protein: 6.6 g/dL (ref 6.0–8.3)

## 2019-07-04 LAB — LIPID PANEL
Cholesterol: 129 mg/dL (ref 0–200)
HDL: 41.8 mg/dL (ref >39.00)
NonHDL: 87.03
Total CHOL/HDL Ratio: 3
Triglycerides: 246 mg/dL — ABNORMAL HIGH (ref 0.0–149.0)
VLDL: 49.2 mg/dL — ABNORMAL HIGH (ref 0.0–40.0)

## 2019-07-04 LAB — URIC ACID: Uric Acid, Serum: 8.5 mg/dL — ABNORMAL HIGH (ref 4.0–7.8)

## 2019-07-04 LAB — MICROALBUMIN / CREATININE URINE RATIO
Creatinine,U: 186.3 mg/dL
Microalb Creat Ratio: 1.3 mg/g (ref 0.0–30.0)
Microalb, Ur: 2.3 mg/dL — ABNORMAL HIGH (ref 0.0–1.9)

## 2019-07-04 LAB — VITAMIN B12: Vitamin B-12: 172 pg/mL — ABNORMAL LOW (ref 211–911)

## 2019-07-04 LAB — LDL CHOLESTEROL, DIRECT: Direct LDL: 63 mg/dL

## 2019-07-04 LAB — PSA: PSA: 0.93 ng/mL (ref 0.10–4.00)

## 2019-07-04 LAB — HEMOGLOBIN A1C: Hgb A1c MFr Bld: 7.3 % — ABNORMAL HIGH (ref 4.6–6.5)

## 2019-07-10 ENCOUNTER — Other Ambulatory Visit: Payer: Self-pay

## 2019-07-10 ENCOUNTER — Encounter: Payer: Self-pay | Admitting: Family Medicine

## 2019-07-10 ENCOUNTER — Ambulatory Visit (INDEPENDENT_AMBULATORY_CARE_PROVIDER_SITE_OTHER): Payer: Medicare HMO | Admitting: Family Medicine

## 2019-07-10 VITALS — BP 138/78 | HR 77 | Temp 97.6°F | Ht 70.0 in | Wt 238.4 lb

## 2019-07-10 DIAGNOSIS — E1169 Type 2 diabetes mellitus with other specified complication: Secondary | ICD-10-CM

## 2019-07-10 DIAGNOSIS — E1140 Type 2 diabetes mellitus with diabetic neuropathy, unspecified: Secondary | ICD-10-CM

## 2019-07-10 DIAGNOSIS — E785 Hyperlipidemia, unspecified: Secondary | ICD-10-CM

## 2019-07-10 DIAGNOSIS — Z0000 Encounter for general adult medical examination without abnormal findings: Secondary | ICD-10-CM

## 2019-07-10 DIAGNOSIS — E538 Deficiency of other specified B group vitamins: Secondary | ICD-10-CM | POA: Diagnosis not present

## 2019-07-10 DIAGNOSIS — I77810 Thoracic aortic ectasia: Secondary | ICD-10-CM

## 2019-07-10 DIAGNOSIS — K219 Gastro-esophageal reflux disease without esophagitis: Secondary | ICD-10-CM

## 2019-07-10 DIAGNOSIS — Z0001 Encounter for general adult medical examination with abnormal findings: Secondary | ICD-10-CM

## 2019-07-10 DIAGNOSIS — M1A0710 Idiopathic chronic gout, right ankle and foot, without tophus (tophi): Secondary | ICD-10-CM

## 2019-07-10 DIAGNOSIS — I10 Essential (primary) hypertension: Secondary | ICD-10-CM

## 2019-07-10 DIAGNOSIS — R6889 Other general symptoms and signs: Secondary | ICD-10-CM | POA: Diagnosis not present

## 2019-07-10 DIAGNOSIS — R972 Elevated prostate specific antigen [PSA]: Secondary | ICD-10-CM

## 2019-07-10 DIAGNOSIS — Z7189 Other specified counseling: Secondary | ICD-10-CM

## 2019-07-10 DIAGNOSIS — E669 Obesity, unspecified: Secondary | ICD-10-CM

## 2019-07-10 LAB — CBC WITH DIFFERENTIAL/PLATELET
Basophils Absolute: 0 10*3/uL (ref 0.0–0.1)
Basophils Relative: 0.4 % (ref 0.0–3.0)
Eosinophils Absolute: 0.2 10*3/uL (ref 0.0–0.7)
Eosinophils Relative: 1.8 % (ref 0.0–5.0)
HCT: 46.9 % (ref 39.0–52.0)
Hemoglobin: 15.7 g/dL (ref 13.0–17.0)
Lymphocytes Relative: 30.3 % (ref 12.0–46.0)
Lymphs Abs: 2.6 10*3/uL (ref 0.7–4.0)
MCHC: 33.4 g/dL (ref 30.0–36.0)
MCV: 99.1 fl (ref 78.0–100.0)
Monocytes Absolute: 0.7 10*3/uL (ref 0.1–1.0)
Monocytes Relative: 8.5 % (ref 3.0–12.0)
Neutro Abs: 5.1 10*3/uL (ref 1.4–7.7)
Neutrophils Relative %: 59 % (ref 43.0–77.0)
Platelets: 246 10*3/uL (ref 150.0–400.0)
RBC: 4.73 Mil/uL (ref 4.22–5.81)
RDW: 12.3 % (ref 11.5–15.5)
WBC: 8.7 10*3/uL (ref 4.0–10.5)

## 2019-07-10 LAB — TSH: TSH: 2.16 u[IU]/mL (ref 0.35–4.50)

## 2019-07-10 MED ORDER — METOPROLOL SUCCINATE ER 25 MG PO TB24: 25.0000 mg | ORAL_TABLET | Freq: Every day | ORAL | 3 refills | Status: DC

## 2019-07-10 MED ORDER — FISH OIL 1000 MG PO CAPS: 1000.0000 mg | ORAL_CAPSULE | Freq: Two times a day (BID) | ORAL | 0 refills | Status: DC

## 2019-07-10 MED ORDER — AMLODIPINE BESYLATE 5 MG PO TABS: 5.0000 mg | ORAL_TABLET | Freq: Every day | ORAL | 3 refills | Status: DC

## 2019-07-10 MED ORDER — ROSUVASTATIN CALCIUM 5 MG PO TABS: 5.0000 mg | ORAL_TABLET | Freq: Every day | ORAL | 3 refills | Status: DC

## 2019-07-10 MED ORDER — CYANOCOBALAMIN 1000 MCG/ML IJ SOLN
1000.0000 ug | Freq: Once | INTRAMUSCULAR | Status: AC
  Administered 2019-07-10: 1000 ug via INTRAMUSCULAR

## 2019-07-10 NOTE — Assessment & Plan Note (Signed)
Chronic, stable. Continue current regimen. 

## 2019-07-10 NOTE — Assessment & Plan Note (Addendum)
Deteriorated. Recommend closer adherence to diabetic diet for 6 months then reassess at f/u visit. Discussed possibly starting antihyperglycemic. Foot exam today.

## 2019-07-10 NOTE — Assessment & Plan Note (Signed)

## 2019-07-10 NOTE — Progress Notes (Signed)
This visit was conducted in person.  BP 138/78 (BP Location: Right Arm, Patient Position: Sitting, Cuff Size: Large)   Pulse 77   Temp 97.6 F (36.4 C) (Temporal)   Ht 5\' 10"  (1.778 m)   Wt 238 lb 7 oz (108.2 kg)   SpO2 95%   BMI 34.21 kg/m    CC: CPE Subjective:    Patient ID: Devin Huffman, male    DOB: [birth year], 73 y.o.   MRN: [id number]  HPI: Devin Huffman is a 73 y.o. male presenting on 07/10/2019 for Annual Exam   Did not see health coach this year.  Heat intolerance for years. Ongoing paresthesias of bilateral soles "feel on fire" and "walking on bubble wrap".  Lab Results  Component Value Date   TSH 0.91 08/31/2016      Hearing Screening   125Hz  250Hz  500Hz  1000Hz  2000Hz  3000Hz  4000Hz  6000Hz  8000Hz   Right ear:   20 20 20   40    Left ear:   25 40 20  40      Visual Acuity Screening   Right eye Left eye Both eyes  Without correction: 20/25 20/40 20/25   With correction:         Office Visit from 07/10/2019 in [place] at [facility] Total Score  0      Fall Risk  07/10/2019 05/27/2018 05/22/2017 11/17/2015 11/11/2014  Falls in the past year? 0 No No No No    Preventative: Colon cancer screening - Dr. Fuller Plan. 03/2011;2 polyps - tubular adenoma, rpt 5 yrs. COLONOSCOPY 01/2018 diverticulosis, rpt 5 yrs Fuller Plan) Prostate cancer screening - discussed. PSA yearly, DRE every few years. Mild nocturia, strong stream.  Flu shot yearly Tetanus 2010 Pneumovax 2013, prevnar 2015 zostavax11/02/2013. Shingrix - discussed, will check at pharmacy Advanced directives: does not want prolonged life support. [place] for reversible cause. Wife would be HCPOA.  Seat belt use discussed Sunscreen use discussed. No changing moles on skin. 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 No regular exercise.  Diet: "I love my ice cream. I can't live forever", some water     Relevant past medical, surgical, family and social history reviewed and updated as indicated. Interim medical history since our last visit reviewed. Allergies and medications reviewed and updated. Outpatient Medications Prior to Visit  Medication Sig Dispense Refill  . aspirin EC 81 MG tablet Take 1 tablet (81 mg total) by mouth daily.    . B Complex-Folic Acid (B COMPLEX-VITAMIN B12 PO) Take 1 tablet by mouth daily.    . Cholecalciferol (VITAMIN D3) 20 MCG TABS Take 25 mcg by mouth daily at 6 (six) AM.    . Coenzyme Q10 50 MG CAPS Take 1 capsule (50 mg total) by mouth daily.  0  . indomethacin (INDOCIN) 50 MG capsule TAKE 1 CAPSULE BY MOUTH THREE TIMES DAILY AS NEEDED (Patient taking differently: TAKE 1 CAPSULE BY MOUTH THREE TIMES DAILY AS NEEDED.) 30 capsule 0  . omeprazole (PRILOSEC) 20 MG capsule TAKE ONE CAPSULE BY MOUTH ONCE DAILY AS NEEDED 90 capsule 0  . vitamin C (ASCORBIC ACID) 500 MG tablet Take 500 mg by mouth daily.     Marland Kitchen amLODipine (NORVASC) 5 MG  tablet TAKE 1 TABLET BY MOUTH AT BEDTIME 90 tablet 0  . metoprolol succinate (TOPROL-XL) 25 MG 24 hr tablet TAKE 1 TABLET BY MOUTH AT BEDTIME 90 tablet 0  . Omega-3 Fatty Acids (FISH OIL) 1000 MG CAPS Take 2 capsules (2,000 mg total) by mouth daily.  0  . rosuvastatin (CRESTOR) 5 MG tablet Take 1 tablet by mouth once daily 90 tablet 0   No facility-administered medications prior to visit.      Per HPI unless specifically indicated in ROS section below Review of Systems  Constitutional: Negative for activity change, appetite change, chills, fatigue, fever and unexpected weight change.  HENT: Negative for hearing loss.   Eyes: Negative for visual disturbance.  Respiratory: Negative for cough, chest tightness, shortness of breath and wheezing.    Cardiovascular: Negative for chest pain, palpitations and leg swelling.  Gastrointestinal: Negative for abdominal distention, abdominal pain, blood in stool, constipation, diarrhea, nausea and vomiting.  Genitourinary: Negative for difficulty urinating and hematuria.  Musculoskeletal: Negative for arthralgias, myalgias and neck pain.  Skin: Negative for rash.  Neurological: Negative for dizziness, seizures, syncope and headaches.  Hematological: Negative for adenopathy. Does not bruise/bleed easily.  Psychiatric/Behavioral: Negative for dysphoric mood. The patient is not nervous/anxious.    Objective:    BP 138/78 (BP Location: Right Arm, Patient Position: Sitting, Cuff Size: Large)   Pulse 77   Temp 97.6 F (36.4 C) (Temporal)   Ht 5\' 10"  (1.778 m)   Wt 238 lb 7 oz (108.2 kg)   SpO2 95%   BMI 34.21 kg/m   Wt Readings from Last 3 Encounters:  07/10/19 238 lb 7 oz (108.2 kg)  11/29/18 235 lb (106.6 kg)  10/11/18 236 lb 8 oz (107.3 kg)    Physical Exam Vitals signs and nursing note reviewed.  Constitutional:      General: He is not in acute distress.    Appearance: Normal appearance. He is well-developed. He is not ill-appearing.  HENT:     Head: Normocephalic and atraumatic.     Right Ear: Hearing, tympanic membrane, ear canal and external ear normal.     Left Ear: Hearing, tympanic membrane, ear canal and external ear normal.     Nose: Nose normal.     Mouth/Throat:     Mouth: Mucous membranes are moist.     Pharynx: Uvula midline. No oropharyngeal exudate or posterior oropharyngeal erythema.  Eyes:     General: No scleral icterus.    Extraocular Movements: Extraocular movements intact.     Conjunctiva/sclera: Conjunctivae normal.     Pupils: Pupils are equal, round, and reactive to light.  Neck:     Musculoskeletal: Normal range of motion and neck supple.  Cardiovascular:     Rate and Rhythm: Normal rate and regular rhythm.     Pulses: Normal pulses.          Radial  pulses are 2+ on the right side and 2+ on the left side.     Heart sounds: Normal heart sounds. No murmur.  Pulmonary:     Effort: Pulmonary effort is normal. No respiratory distress.     Breath sounds: Normal breath sounds. No wheezing, rhonchi or rales.  Abdominal:     General: Abdomen is flat. Bowel sounds are normal. There is no distension.     Palpations: Abdomen is soft. There is no mass.     Tenderness: There is no abdominal tenderness. There is no guarding or rebound.     Hernia: No  hernia is present.  Musculoskeletal: Normal range of motion.     Right lower leg: No edema.     Left lower leg: No edema.  Lymphadenopathy:     Cervical: No cervical adenopathy.  Skin:    General: Skin is warm and dry.     Findings: No rash.  Neurological:     General: No focal deficit present.     Mental Status: He is alert and oriented to person, place, and time.     Comments:  CN grossly intact, station and gait intact Recall 3/3 Calculation [id number]  Psychiatric:        Mood and Affect: Mood normal.        Behavior: Behavior normal.        Thought Content: Thought content normal.        Judgment: Judgment normal.    Diabetic Foot Exam - Simple   Simple Foot Form Diabetic Foot exam was performed with the following findings: Yes 07/10/2019  9:59 AM  Visual Inspection See comments: Yes Sensation Testing Intact to touch and monofilament testing bilaterally: Yes Pulse Check See comments: Yes Comments Diminished DP, PT 2+ bilaterally Thickened onychomycotic toenails Scaling of soles bilaterally        Results for orders placed or performed in visit on 07/04/19  Microalbumin / creatinine urine ratio  Result Value Ref Range   Microalb, Ur 2.3 (H) 0.0 - 1.9 mg/dL   Creatinine,U 186.3 mg/dL   Microalb Creat Ratio 1.3 0.0 - 30.0 mg/g  Uric acid  Result Value Ref Range   Uric Acid, Serum 8.5 (H) 4.0 - 7.8 mg/dL  PSA  Result Value Ref Range   PSA 0.93 0.10 - 4.00 ng/mL   Hemoglobin A1c  Result Value Ref Range   Hgb A1c MFr Bld 7.3 (H) 4.6 - 6.5 %  Comprehensive metabolic panel  Result Value Ref Range   Sodium 142 135 - 145 mEq/L   Potassium 4.8 3.5 - 5.1 mEq/L   Chloride 105 96 - 112 mEq/L   CO2 28 19 - 32 mEq/L   Glucose, Bld 134 (H) 70 - 99 mg/dL   BUN 23 6 - 23 mg/dL   Creatinine, Ser 1.01 0.40 - 1.50 mg/dL   Total Bilirubin 0.9 0.2 - 1.2 mg/dL   Alkaline Phosphatase 80 39 - 117 U/L   AST 22 0 - 37 U/L   ALT 33 0 - 53 U/L   Total Protein 6.6 6.0 - 8.3 g/dL   Albumin 4.3 3.5 - 5.2 g/dL   Calcium 9.3 8.4 - 10.5 mg/dL   GFR 72.07 >60.00 mL/min  Lipid panel  Result Value Ref Range   Cholesterol 129 0 - 200 mg/dL   Triglycerides 246.0 (H) 0.0 - 149.0 mg/dL   HDL 41.80 >39.00 mg/dL   VLDL 49.2 (H) 0.0 - 40.0 mg/dL   Total CHOL/HDL Ratio 3    NonHDL 87.03   Vitamin B12  Result Value Ref Range   Vitamin B-12 172 (L) 211 - 911 pg/mL  LDL cholesterol, direct  Result Value Ref Range   Direct LDL 63.0 mg/dL   Assessment & Plan:   Problem List Items Addressed This Visit    Vitamin B12 deficiency    With presumed associated neuropathy. B12 shot today then monthly x 6 months.       Type 2 diabetes mellitus with other specified complication ([facility])    Deteriorated. Recommend closer adherence to diabetic diet for 6 months then reassess at f/u visit. Discussed  possibly starting antihyperglycemic. Foot exam today.       Relevant Medications   rosuvastatin (CRESTOR) 5 MG tablet   Type 2 diabetes mellitus with diabetic neuropathy, unspecified ([place])    Ongoing. B12 low and hasn't been taking oral replacement - will recommend monthly shots for 6 months then continue daily replacement. Discussed gabapentin - he will let me know if desires to try this.      Relevant Medications   rosuvastatin (CRESTOR) 5 MG tablet   Thoracic aortic ectasia (HCC)   Relevant Medications   amLODipine (NORVASC) 5 MG tablet   metoprolol succinate (TOPROL-XL) 25 MG 24 hr  tablet   rosuvastatin (CRESTOR) 5 MG tablet   Obesity, Class I, BMI 30-34.9    Encourage healthy diet and lifestyle       Medicare annual wellness visit, subsequent - Primary    I have personally reviewed the Medicare Annual Wellness questionnaire and have noted 1. The patient's medical and social history 2. Their use of alcohol, tobacco or illicit drugs 3. Their current medications and supplements 4. The patient's functional ability including ADL's, fall risks, home safety risks and hearing or visual impairment. Cognitive function has been assessed and addressed as indicated.  5. Diet and physical activity 6. Evidence for depression or mood disorders The patients weight, height, BMI have been recorded in the chart. I have made referrals, counseling and provided education to the patient based on review of the above and I have provided the pt with a written personalized care plan for preventive services. Provider list updated.. See scanned questionairre as needed for further documentation. Reviewed preventative protocols and updated unless pt declined.       Increased prostate specific antigen (PSA) velocity    PSA reassuringly decreasing.       Gout    Not on urate lowering medication. No recent flares.       GERD (gastroesophageal reflux disease)    Intermittent PPI use.       Essential hypertension    Chronic, stable. Continue current regimen.       Relevant Medications   amLODipine (NORVASC) 5 MG tablet   metoprolol succinate (TOPROL-XL) 25 MG 24 hr tablet   rosuvastatin (CRESTOR) 5 MG tablet   Encounter for routine adult medical exam with abnormal findings    Preventative protocols reviewed and updated unless pt declined. Discussed healthy diet and lifestyle.       Dyslipidemia    Chronic, on rosuvastatin. Trig elevated - will increase fish oil to BID dosing.  The ASCVD Risk score Mikey Bussing DC Jr., et al., 2013) failed to calculate for the following reasons:   The  valid total cholesterol range is 130 to 320 mg/dL       Relevant Medications   rosuvastatin (CRESTOR) 5 MG tablet   Advanced care planning/counseling discussion    Advanced directives: does not want prolonged life support. [place] for reversible cause. Wife would be HCPOA.        Other Visit Diagnoses    Heat intolerance       Relevant Orders   CBC with Differential/Platelet   TSH       Meds ordered this encounter  Medications  . amLODipine (NORVASC) 5 MG tablet    Sig: Take 1 tablet (5 mg total) by mouth at bedtime.    Dispense:  90 tablet    Refill:  3  . metoprolol succinate (TOPROL-XL) 25 MG 24 hr tablet    Sig: Take 1 tablet (25  mg total) by mouth at bedtime.    Dispense:  90 tablet    Refill:  3  . rosuvastatin (CRESTOR) 5 MG tablet    Sig: Take 1 tablet (5 mg total) by mouth daily.    Dispense:  90 tablet    Refill:  3  . Omega-3 Fatty Acids (FISH OIL) 1000 MG CAPS    Sig: Take 1 capsule (1,000 mg total) by mouth 2 (two) times a day.    Refill:  0  . cyanocobalamin ((VITAMIN B-12)) injection 1,000 mcg   Orders Placed This Encounter  Procedures  . CBC with Differential/Platelet  . TSH    Follow up plan: Return in about 1 year (around 07/09/2020) for annual exam, prior fasting for blood work, medicare wellness visit.  Ria Bush, MD

## 2019-07-10 NOTE — Assessment & Plan Note (Signed)
PSA reassuringly decreasing.

## 2019-07-10 NOTE — Assessment & Plan Note (Signed)
Intermittent PPI use.

## 2019-07-10 NOTE — Assessment & Plan Note (Signed)
Not on urate lowering medication. No recent flares.

## 2019-07-10 NOTE — Assessment & Plan Note (Signed)
Encourage healthy diet and lifestyle.  

## 2019-07-10 NOTE — Patient Instructions (Addendum)
Labs today  B12 shot today, I recommend monthly for 6 months to replete low levels.  If interested, check with pharmacy about new 2 shot shingles series (shingrix).  Check on living will / advanced directive at home.  Triglycerides were elevated as was sugar. Increase fish oil to twice daily. Work on diabetic diet.  Return in 6 months for diabetes follow up.   Health Maintenance After Age 75 After age 64, you are at a higher risk for certain long-term diseases and infections as well as injuries from falls. Falls are a major cause of broken bones and head injuries in people who are older than age 21. Getting regular preventive care can help to keep you healthy and well. Preventive care includes getting regular testing and making lifestyle changes as recommended by your health care provider. Talk with your health care provider about:  Which screenings and tests you should have. A screening is a test that checks for a disease when you have no symptoms.  A diet and exercise plan that is right for you. What should I know about screenings and tests to prevent falls? Screening and testing are the best ways to find a health problem early. Early diagnosis and treatment give you the best chance of managing medical conditions that are common after age 41. Certain conditions and lifestyle choices may make you more likely to have a fall. Your health care provider may recommend:  Regular vision checks. Poor vision and conditions such as cataracts can make you more likely to have a fall. If you wear glasses, make sure to get your prescription updated if your vision changes.  Medicine review. Work with your health care provider to regularly review all of the medicines you are taking, including over-the-counter medicines. Ask your health care provider about any side effects that may make you more likely to have a fall. Tell your health care provider if any medicines that you take make you feel dizzy or  sleepy.  Osteoporosis screening. Osteoporosis is a condition that causes the bones to get weaker. This can make the bones weak and cause them to break more easily.  Blood pressure screening. Blood pressure changes and medicines to control blood pressure can make you feel dizzy.  Strength and balance checks. Your health care provider may recommend certain tests to check your strength and balance while standing, walking, or changing positions.  Foot health exam. Foot pain and numbness, as well as not wearing proper footwear, can make you more likely to have a fall.  Depression screening. You may be more likely to have a fall if you have a fear of falling, feel emotionally low, or feel unable to do activities that you used to do.  Alcohol use screening. Using too much alcohol can affect your balance and may make you more likely to have a fall. What actions can I take to lower my risk of falls? General instructions  Talk with your health care provider about your risks for falling. Tell your health care provider if: ? You fall. Be sure to tell your health care provider about all falls, even ones that seem minor. ? You feel dizzy, sleepy, or off-balance.  Take over-the-counter and prescription medicines only as told by your health care provider. These include any supplements.  Eat a healthy diet and maintain a healthy weight. A healthy diet includes low-fat dairy products, low-fat (lean) meats, and fiber from whole grains, beans, and lots of fruits and vegetables. Home safety  Remove any  tripping hazards, such as rugs, cords, and clutter.  Install safety equipment such as grab bars in bathrooms and safety rails on stairs.  Keep rooms and walkways well-lit. Activity   Follow a regular exercise program to stay fit. This will help you maintain your balance. Ask your health care provider what types of exercise are appropriate for you.  If you need a cane or walker, use it as recommended by  your health care provider.  Wear supportive shoes that have nonskid soles. Lifestyle  Do not drink alcohol if your health care provider tells you not to drink.  If you drink alcohol, limit how much you have: ? 0-1 drink a day for women. ? 0-2 drinks a day for men.  Be aware of how much alcohol is in your drink. In the [place] one drink equals one typical bottle of beer (12 oz), one-half glass of wine (5 oz), or one shot of hard liquor (1 oz).  Do not use any products that contain nicotine or tobacco, such as cigarettes and e-cigarettes. If you need help quitting, ask your health care provider. Summary  Having a healthy lifestyle and getting preventive care can help to protect your health and wellness after age 30.  Screening and testing are the best way to find a health problem early and help you avoid having a fall. Early diagnosis and treatment give you the best chance for managing medical conditions that are more common for people who are older than age 69.  Falls are a major cause of broken bones and head injuries in people who are older than age 12. Take precautions to prevent a fall at home.  Work with your health care provider to learn what changes you can make to improve your health and wellness and to prevent falls. This information is not intended to replace advice given to you by your health care provider. Make sure you discuss any questions you have with your health care provider. Document Released: 10/10/2017 Document Revised: 03/20/2019 Document Reviewed: 10/10/2017 Elsevier Patient Education  2020 [employer].

## 2019-07-10 NOTE — Assessment & Plan Note (Addendum)
Advanced directives: does not want prolonged life support.Okfor reversible cause. Wife would be HCPOA.  

## 2019-07-10 NOTE — Assessment & Plan Note (Addendum)
Chronic, on rosuvastatin. Trig elevated - will increase fish oil to BID dosing.  The ASCVD Risk score Mikey Bussing DC Jr., et al., 2013) failed to calculate for the following reasons:   The valid total cholesterol range is 130 to 320 mg/dL

## 2019-07-10 NOTE — Assessment & Plan Note (Signed)
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 B12 shot today then monthly x 6 months.

## 2019-07-10 NOTE — Assessment & Plan Note (Signed)
Preventative protocols reviewed and updated unless pt declined. Discussed healthy diet and lifestyle.  

## 2019-07-10 NOTE — Assessment & Plan Note (Addendum)
Ongoing. B12 low and hasn't been taking oral replacement - will recommend monthly shots for 6 months then continue daily replacement. Discussed gabapentin - he will let me know if desires to try this.

## 2019-08-12 ENCOUNTER — Ambulatory Visit (INDEPENDENT_AMBULATORY_CARE_PROVIDER_SITE_OTHER): Payer: Medicare HMO | Admitting: *Deleted

## 2019-08-12 DIAGNOSIS — E538 Deficiency of other specified B group vitamins: Secondary | ICD-10-CM

## 2019-08-12 MED ORDER — CYANOCOBALAMIN 1000 MCG/ML IJ SOLN
1000.0000 ug | Freq: Once | INTRAMUSCULAR | Status: AC
  Administered 2019-08-12: 1000 ug via INTRAMUSCULAR

## 2019-08-12 NOTE — Progress Notes (Signed)
Per orders of Dr. Danise Mina, injection of Vit B12 given in left deltoid by Gricel Copen M. Patient tolerated injection well.

## 2019-08-25 DIAGNOSIS — R69 Illness, unspecified: Secondary | ICD-10-CM | POA: Diagnosis not present

## 2019-09-03 DIAGNOSIS — H25013 Cortical age-related cataract, bilateral: Secondary | ICD-10-CM | POA: Diagnosis not present

## 2019-09-03 DIAGNOSIS — H43813 Vitreous degeneration, bilateral: Secondary | ICD-10-CM | POA: Diagnosis not present

## 2019-09-03 DIAGNOSIS — H2513 Age-related nuclear cataract, bilateral: Secondary | ICD-10-CM | POA: Diagnosis not present

## 2019-09-03 DIAGNOSIS — H35033 Hypertensive retinopathy, bilateral: Secondary | ICD-10-CM | POA: Diagnosis not present

## 2019-09-03 LAB — HM DIABETES EYE EXAM

## 2019-09-10 ENCOUNTER — Encounter: Payer: Self-pay | Admitting: Family Medicine

## 2019-09-10 DIAGNOSIS — H43819 Vitreous degeneration, unspecified eye: Secondary | ICD-10-CM | POA: Insufficient documentation

## 2019-09-10 DIAGNOSIS — I739 Peripheral vascular disease, unspecified: Secondary | ICD-10-CM | POA: Insufficient documentation

## 2019-09-11 ENCOUNTER — Encounter: Payer: Self-pay | Admitting: Family Medicine

## 2019-09-16 ENCOUNTER — Ambulatory Visit: Payer: Medicare HMO

## 2019-09-16 ENCOUNTER — Ambulatory Visit (INDEPENDENT_AMBULATORY_CARE_PROVIDER_SITE_OTHER): Payer: Medicare HMO

## 2019-09-16 DIAGNOSIS — E538 Deficiency of other specified B group vitamins: Secondary | ICD-10-CM

## 2019-09-16 MED ORDER — CYANOCOBALAMIN 1000 MCG/ML IJ SOLN
1000.0000 ug | Freq: Once | INTRAMUSCULAR | Status: AC
  Administered 2019-09-16: 1000 ug via INTRAMUSCULAR

## 2019-09-16 NOTE — Progress Notes (Signed)
Per orders of Dr. Gutierrez, injection of vit B12 given by Bralynn Velador. Patient tolerated injection well.  

## 2019-10-21 ENCOUNTER — Other Ambulatory Visit: Payer: Self-pay

## 2019-10-21 ENCOUNTER — Ambulatory Visit (INDEPENDENT_AMBULATORY_CARE_PROVIDER_SITE_OTHER): Payer: Medicare HMO | Admitting: *Deleted

## 2019-10-21 DIAGNOSIS — E538 Deficiency of other specified B group vitamins: Secondary | ICD-10-CM

## 2019-10-21 MED ORDER — CYANOCOBALAMIN 1000 MCG/ML IJ SOLN
1000.0000 ug | Freq: Once | INTRAMUSCULAR | Status: AC
  Administered 2019-10-21: 1000 ug via INTRAMUSCULAR

## 2019-10-21 NOTE — Progress Notes (Addendum)
Per orders of Dr. Danise Mina, injection of Vit B12 given in Left Deltoid by Jess Toney M. Patient tolerated injection well.

## 2019-11-21 ENCOUNTER — Telehealth: Payer: Self-pay

## 2019-11-21 NOTE — Telephone Encounter (Signed)
Left message for patient to call back to be screened for his appointment for nurse visit on 11/25/2019.

## 2019-11-25 ENCOUNTER — Ambulatory Visit (INDEPENDENT_AMBULATORY_CARE_PROVIDER_SITE_OTHER): Payer: Medicare HMO | Admitting: *Deleted

## 2019-11-25 ENCOUNTER — Other Ambulatory Visit: Payer: Self-pay

## 2019-11-25 DIAGNOSIS — E538 Deficiency of other specified B group vitamins: Secondary | ICD-10-CM | POA: Diagnosis not present

## 2019-11-25 MED ORDER — CYANOCOBALAMIN 1000 MCG/ML IJ SOLN
1000.0000 ug | Freq: Once | INTRAMUSCULAR | Status: AC
  Administered 2019-11-25: 1000 ug via INTRAMUSCULAR

## 2019-11-25 NOTE — Progress Notes (Signed)
Per orders of Dr. Gutierrez, injection of Vit B12 given by Nehal Witting M. Patient tolerated injection well.  

## 2019-12-30 ENCOUNTER — Ambulatory Visit (INDEPENDENT_AMBULATORY_CARE_PROVIDER_SITE_OTHER): Payer: Medicare HMO

## 2019-12-30 ENCOUNTER — Other Ambulatory Visit: Payer: Self-pay

## 2019-12-30 DIAGNOSIS — E538 Deficiency of other specified B group vitamins: Secondary | ICD-10-CM | POA: Diagnosis not present

## 2019-12-30 MED ORDER — CYANOCOBALAMIN 1000 MCG/ML IJ SOLN
1000.0000 ug | Freq: Once | INTRAMUSCULAR | Status: AC
  Administered 2019-12-30: 1000 ug via INTRAMUSCULAR

## 2019-12-30 NOTE — Progress Notes (Signed)
Per orders of Dr. Danise Mina, injection of Vit B12 in L Deltoid - given by Jaclyn Prime, CMA. Patient tolerated injection well.

## 2020-01-11 ENCOUNTER — Ambulatory Visit: Payer: Self-pay

## 2020-01-15 ENCOUNTER — Ambulatory Visit: Payer: Medicare HMO | Attending: Internal Medicine

## 2020-01-15 DIAGNOSIS — Z20822 Contact with and (suspected) exposure to covid-19: Secondary | ICD-10-CM | POA: Diagnosis not present

## 2020-01-16 ENCOUNTER — Ambulatory Visit: Payer: Self-pay

## 2020-01-16 LAB — SPECIMEN STATUS REPORT

## 2020-01-16 LAB — NOVEL CORONAVIRUS, NAA: SARS-CoV-2, NAA: DETECTED — AB

## 2020-01-17 ENCOUNTER — Telehealth: Payer: Self-pay | Admitting: Unknown Physician Specialty

## 2020-01-17 ENCOUNTER — Other Ambulatory Visit: Payer: Self-pay | Admitting: Unknown Physician Specialty

## 2020-01-17 DIAGNOSIS — E1140 Type 2 diabetes mellitus with diabetic neuropathy, unspecified: Secondary | ICD-10-CM

## 2020-01-17 DIAGNOSIS — U071 COVID-19: Secondary | ICD-10-CM

## 2020-01-17 NOTE — Telephone Encounter (Signed)
  I connected by phone with [name] on 01/17/2020 at 9:01 AM to discuss the potential use of an new treatment for mild to moderate COVID-19 viral infection in non-hospitalized patients.  This patient is a 74 y.o. male that meets the FDA criteria for Emergency Use Authorization of bamlanivimab or casirivimab\imdevimab.  Has a (+) direct SARS-CoV-2 viral test result  Has mild or moderate COVID-19   Is ? 76 years of age and weighs ? 40 kg  Is NOT hospitalized due to COVID-19  Is NOT requiring oxygen therapy or requiring an increase in baseline oxygen flow rate due to COVID-19  Is within 10 days of symptom onset  Has at least one of the high risk factor(s) for progression to severe COVID-19 and/or hospitalization as defined in EUA.  Specific high risk criteria : >/= 76 yo and co-morbid conditions treated by PCP at [facility]   I have spoken and communicated the following to the patient or parent/caregiver:  1. FDA has authorized the emergency use of bamlanivimab and casirivimab\imdevimab for the treatment of mild to moderate COVID-19 in adults and pediatric patients with positive results of direct SARS-CoV-2 viral testing who are 11 years of age and older weighing at least 40 kg, and who are at high risk for progressing to severe COVID-19 and/or hospitalization.  2. The significant known and potential risks and benefits of bamlanivimab and casirivimab\imdevimab, and the extent to which such potential risks and benefits are unknown.  3. Information on available alternative treatments and the risks and benefits of those alternatives, including clinical trials.  4. Patients treated with bamlanivimab and casirivimab\imdevimab should continue to self-isolate and use infection control measures (e.g., wear mask, isolate, social distance, avoid sharing personal items, clean and disinfect "high touch" surfaces, and frequent handwashing) according to CDC guidelines.   5. The patient or  parent/caregiver has the option to accept or refuse bamlanivimab or casirivimab\imdevimab .  After reviewing this information with the patient, The patient agreed to proceed with receiving the bamlanimivab infusion and will be provided a copy of the Fact sheet prior to receiving the infusion.Kathrine Haddock 01/17/2020 9:01 AM      Sx onset 1/31

## 2020-01-18 ENCOUNTER — Ambulatory Visit (HOSPITAL_COMMUNITY)
Admission: RE | Admit: 2020-01-18 | Discharge: 2020-01-18 | Disposition: A | Discharge status: Home / Self Care | Payer: Medicare Other | Source: Ambulatory Visit | Attending: Pulmonary Disease | Admitting: Pulmonary Disease

## 2020-01-18 DIAGNOSIS — U071 COVID-19: Secondary | ICD-10-CM | POA: Diagnosis present

## 2020-01-18 DIAGNOSIS — Z23 Encounter for immunization: Secondary | ICD-10-CM | POA: Insufficient documentation

## 2020-01-18 DIAGNOSIS — E1140 Type 2 diabetes mellitus with diabetic neuropathy, unspecified: Secondary | ICD-10-CM

## 2020-01-18 MED ORDER — FAMOTIDINE IN NACL 20-0.9 MG/50ML-% IV SOLN: 20.0000 mg | Freq: Once | INTRAVENOUS | Status: DC | PRN

## 2020-01-18 MED ORDER — SODIUM CHLORIDE 0.9 % IV SOLN
INTRAVENOUS | Status: DC | PRN
  Administered 2020-01-18: 250 mL via INTRAVENOUS

## 2020-01-18 MED ORDER — METHYLPREDNISOLONE SODIUM SUCC 125 MG IJ SOLR: 125.0000 mg | Freq: Once | INTRAMUSCULAR | Status: DC | PRN

## 2020-01-18 MED ORDER — SODIUM CHLORIDE 0.9 % IV SOLN
700.0000 mg | Freq: Once | INTRAVENOUS | Status: AC
  Administered 2020-01-18: 700 mg via INTRAVENOUS
  Filled 2020-01-18: qty 20

## 2020-01-18 MED ORDER — EPINEPHRINE 0.3 MG/0.3ML IJ SOAJ: 0.3000 mg | Freq: Once | INTRAMUSCULAR | Status: DC | PRN

## 2020-01-18 MED ORDER — DIPHENHYDRAMINE HCL 50 MG/ML IJ SOLN: 50.0000 mg | Freq: Once | INTRAMUSCULAR | Status: DC | PRN

## 2020-01-18 MED ORDER — ALBUTEROL SULFATE HFA 108 (90 BASE) MCG/ACT IN AERS: 2.0000 | INHALATION_SPRAY | Freq: Once | RESPIRATORY_TRACT | Status: DC | PRN

## 2020-01-18 NOTE — Progress Notes (Signed)
  Diagnosis: COVID-19  Physician:  Procedure: [facility] Infusion Clinic Med: bamlanivimab infusion - Provided patient with bamlanimivab fact sheet for patients, parents and caregivers prior to infusion.  Complications: No immediate complications noted.  Discharge: Discharged home   Scotty Court 01/18/2020

## 2020-01-18 NOTE — Discharge Instructions (Signed)

## 2020-01-19 ENCOUNTER — Telehealth: Payer: Self-pay | Admitting: Family Medicine

## 2020-01-19 NOTE — Telephone Encounter (Signed)
Noted. Thank you. plz call Wed for f/u on symptoms.

## 2020-01-19 NOTE — Telephone Encounter (Signed)
-----   Message from Randall An, RN sent at 01/19/2020 10:58 AM EST ----- Contacted pt's wife who reports they both received antibody infusions yesterday. Pt c/o a lot of fatigue, temp at 3am was 101.9F and 98.9F at 10am. Using ibuprofen because tylenol does not help. Pt also c/o cough. No worsening symptoms.  Advised if any new or worsening symptoms to contact this office. Pt's wife verbalized understanding.

## 2020-01-19 NOTE — Telephone Encounter (Signed)
Noted  

## 2020-01-21 ENCOUNTER — Encounter: Payer: Self-pay | Admitting: Family Medicine

## 2020-01-21 ENCOUNTER — Ambulatory Visit (INDEPENDENT_AMBULATORY_CARE_PROVIDER_SITE_OTHER): Payer: Medicare HMO | Admitting: Family Medicine

## 2020-01-21 DIAGNOSIS — I10 Essential (primary) hypertension: Secondary | ICD-10-CM

## 2020-01-21 DIAGNOSIS — J1282 Pneumonia due to coronavirus disease 2019: Secondary | ICD-10-CM | POA: Insufficient documentation

## 2020-01-21 DIAGNOSIS — U071 COVID-19: Secondary | ICD-10-CM | POA: Diagnosis not present

## 2020-01-21 HISTORY — DX: COVID-19: U07.1

## 2020-01-21 NOTE — Telephone Encounter (Addendum)
Initial day of symptoms 01/11/2020.  Hopeful for turning the corner over next few days but recovery can take weeks.  Would continue to watch for now, letting us know if fever >101 or worsening dyspnea develops.  Is he taking anything for cough? Ok to offer virtual visit at 4:30pm today if pt desires.  Would also place on call list for Friday.

## 2020-01-21 NOTE — Telephone Encounter (Signed)
Actually plz call pt tomorrow for an update on symptoms and again on Friday. If worsening dyspnea will offer respiratory clinic eval.

## 2020-01-21 NOTE — Telephone Encounter (Signed)
Noted  

## 2020-01-21 NOTE — Assessment & Plan Note (Signed)
10 days into illness, still with cough, dyspnea, fatigue and intermittent fevers. He did complete Bamlanivimab infusion on 01/18/2020. Extensively reviewed indications to need in person evaluation or call 911/EMS including progressive dyspnea at rest or signs of cyanosis. Will continue supportive care for the next 24-48 hours with close monitoring at home. Also discussed option of after hours respiratory clinic at [facility]. Will watch at home for now. Declines cough medication.

## 2020-01-21 NOTE — Progress Notes (Signed)
   [name] - 74 y.o. male  MRN [id number]  Date of Birth: 10/30/1946  PCP: Ria Bush, MD  This service was provided via telemedicine. Phone Visit performed on 01/21/2020    Rationale for phone visit along with limitations reviewed. Patient consented to telephone encounter.    Location of patient: at home Location of provider: in office, [place] [facility] Name of referring provider: N/A   Names of persons and role in encounter: Provider: Ria Bush, MD  Patient: Devin Huffman  Other: N/A  Time on call: 4:41pm - 4:56pm  Subjective: Chief Complaint  Patient presents with  . Shortness of Breath    C/o having to rest often when trying to do daily activities [ie., showering, moving around the house, etc].      HPI:  Initial day of symptoms 01/11/2020.  Positive covid test on 01/15/2020.  Completed Bamlanivimab infusion 01/18/2020.  Persistent nonproductive cough, fatigue, dyspnea and intermittent fevers. Even taking a shower makes him short winded. He is not short winded at rest. He doesn't have a way of checking oxygen. Denies lips or fingers turning blue. He is sleeping well at night. No coughing fits.   May have been exposed at work (works part time at [employer]).  Wife also tested positive, she is recovering well.   Temperature ranges 99.1 - 101 F (last 2 days).   BP elevated today - improved on recheck to 142/81, HR 77.  He continues amlodipine, lisinopril, and metoprolol XL regularly.   Objective/Observations:  No physical exam or vital signs collected unless specifically identified below.   BP (!) 142/81   Pulse 77   Temp 99.3 F (37.4 C)   Ht 5\' 10"  (1.778 m)   Wt 227 lb 5 oz (103.1 kg)   BMI 32.62 kg/m    Respiratory status: speaks in complete sentences without shortness of breath.   Assessment/Plan:  COVID-19 virus infection 10 days into illness, still with cough, dyspnea, fatigue and intermittent fevers. He did complete  Bamlanivimab infusion on 01/18/2020. Extensively reviewed indications to need in person evaluation or call 911/EMS including progressive dyspnea at rest or signs of cyanosis. Will continue supportive care for the next 24-48 hours with close monitoring at home. Also discussed option of after hours respiratory clinic at [facility]. Will watch at home for now. Declines cough medication.   Essential hypertension BP improved on recheck. No changes at this time.    I discussed the assessment and treatment plan with the patient. The patient was provided an opportunity to ask questions and all were answered. The patient agreed with the plan and demonstrated an understanding of the instructions.  Lab Orders  No laboratory test(s) ordered today    No orders of the defined types were placed in this encounter.   The patient was advised to call back or seek an in-person evaluation if the symptoms worsen or if the condition fails to improve as anticipated.  Ria Bush, MD

## 2020-01-21 NOTE — Telephone Encounter (Addendum)
Spoke with pt asking for an update.  Says he still has cough, fever off and on and feels weak.  Taking ibuprofen, which is helping with fevers. You do not have any 30 min slots today.  Do you want me to schedule virtual for tomorrow?  FYI to Dr. Darnell Level.

## 2020-01-21 NOTE — Assessment & Plan Note (Signed)
BP improved on recheck. No changes at this time.

## 2020-01-21 NOTE — Telephone Encounter (Signed)
Spoke with pt relaying Dr. Synthia Innocent message.  Pt states he is not taking anything for the cough.  Pt agreed to 4:30 phn visit (no video capability).  Pt placed on Call Log.

## 2020-01-22 ENCOUNTER — Inpatient Hospital Stay (HOSPITAL_COMMUNITY)
Admission: EM | Admit: 2020-01-22 | Discharge: 2020-01-27 | DRG: 177 | Disposition: A | Discharge status: Home Health | Payer: Medicare HMO | Attending: Internal Medicine | Admitting: Internal Medicine

## 2020-01-22 ENCOUNTER — Encounter (HOSPITAL_COMMUNITY): Payer: Self-pay

## 2020-01-22 ENCOUNTER — Emergency Department (HOSPITAL_COMMUNITY): Payer: Medicare HMO

## 2020-01-22 ENCOUNTER — Telehealth: Payer: Self-pay | Admitting: Family Medicine

## 2020-01-22 ENCOUNTER — Other Ambulatory Visit: Payer: Self-pay

## 2020-01-22 DIAGNOSIS — E669 Obesity, unspecified: Secondary | ICD-10-CM | POA: Diagnosis not present

## 2020-01-22 DIAGNOSIS — Z79899 Other long term (current) drug therapy: Secondary | ICD-10-CM

## 2020-01-22 DIAGNOSIS — J9601 Acute respiratory failure with hypoxia: Secondary | ICD-10-CM | POA: Diagnosis present

## 2020-01-22 DIAGNOSIS — E11649 Type 2 diabetes mellitus with hypoglycemia without coma: Secondary | ICD-10-CM | POA: Diagnosis present

## 2020-01-22 DIAGNOSIS — M109 Gout, unspecified: Secondary | ICD-10-CM | POA: Diagnosis present

## 2020-01-22 DIAGNOSIS — Z820 Family history of epilepsy and other diseases of the nervous system: Secondary | ICD-10-CM

## 2020-01-22 DIAGNOSIS — Z803 Family history of malignant neoplasm of breast: Secondary | ICD-10-CM

## 2020-01-22 DIAGNOSIS — U071 COVID-19: Secondary | ICD-10-CM | POA: Diagnosis not present

## 2020-01-22 DIAGNOSIS — E538 Deficiency of other specified B group vitamins: Secondary | ICD-10-CM | POA: Diagnosis present

## 2020-01-22 DIAGNOSIS — Z800 Family history of malignant neoplasm of digestive organs: Secondary | ICD-10-CM

## 2020-01-22 DIAGNOSIS — Y92239 Unspecified place in hospital as the place of occurrence of the external cause: Secondary | ICD-10-CM | POA: Diagnosis not present

## 2020-01-22 DIAGNOSIS — I10 Essential (primary) hypertension: Secondary | ICD-10-CM | POA: Diagnosis not present

## 2020-01-22 DIAGNOSIS — K219 Gastro-esophageal reflux disease without esophagitis: Secondary | ICD-10-CM | POA: Diagnosis present

## 2020-01-22 DIAGNOSIS — E1165 Type 2 diabetes mellitus with hyperglycemia: Secondary | ICD-10-CM | POA: Diagnosis not present

## 2020-01-22 DIAGNOSIS — Z7982 Long term (current) use of aspirin: Secondary | ICD-10-CM | POA: Diagnosis not present

## 2020-01-22 DIAGNOSIS — I739 Peripheral vascular disease, unspecified: Secondary | ICD-10-CM | POA: Diagnosis not present

## 2020-01-22 DIAGNOSIS — E1169 Type 2 diabetes mellitus with other specified complication: Secondary | ICD-10-CM | POA: Diagnosis not present

## 2020-01-22 DIAGNOSIS — J9691 Respiratory failure, unspecified with hypoxia: Secondary | ICD-10-CM

## 2020-01-22 DIAGNOSIS — Z8049 Family history of malignant neoplasm of other genital organs: Secondary | ICD-10-CM | POA: Diagnosis not present

## 2020-01-22 DIAGNOSIS — T380X5A Adverse effect of glucocorticoids and synthetic analogues, initial encounter: Secondary | ICD-10-CM | POA: Diagnosis not present

## 2020-01-22 DIAGNOSIS — Z96641 Presence of right artificial hip joint: Secondary | ICD-10-CM | POA: Diagnosis present

## 2020-01-22 DIAGNOSIS — Z8249 Family history of ischemic heart disease and other diseases of the circulatory system: Secondary | ICD-10-CM | POA: Diagnosis not present

## 2020-01-22 DIAGNOSIS — E1142 Type 2 diabetes mellitus with diabetic polyneuropathy: Secondary | ICD-10-CM | POA: Diagnosis not present

## 2020-01-22 DIAGNOSIS — E785 Hyperlipidemia, unspecified: Secondary | ICD-10-CM | POA: Diagnosis not present

## 2020-01-22 DIAGNOSIS — Z981 Arthrodesis status: Secondary | ICD-10-CM

## 2020-01-22 DIAGNOSIS — Z823 Family history of stroke: Secondary | ICD-10-CM | POA: Diagnosis not present

## 2020-01-22 DIAGNOSIS — R0902 Hypoxemia: Secondary | ICD-10-CM | POA: Diagnosis not present

## 2020-01-22 DIAGNOSIS — Z85828 Personal history of other malignant neoplasm of skin: Secondary | ICD-10-CM

## 2020-01-22 DIAGNOSIS — H43819 Vitreous degeneration, unspecified eye: Secondary | ICD-10-CM | POA: Diagnosis present

## 2020-01-22 DIAGNOSIS — E1151 Type 2 diabetes mellitus with diabetic peripheral angiopathy without gangrene: Secondary | ICD-10-CM | POA: Diagnosis not present

## 2020-01-22 DIAGNOSIS — J1282 Pneumonia due to coronavirus disease 2019: Secondary | ICD-10-CM | POA: Diagnosis present

## 2020-01-22 DIAGNOSIS — Z801 Family history of malignant neoplasm of trachea, bronchus and lung: Secondary | ICD-10-CM

## 2020-01-22 DIAGNOSIS — Z888 Allergy status to other drugs, medicaments and biological substances status: Secondary | ICD-10-CM | POA: Diagnosis not present

## 2020-01-22 DIAGNOSIS — R531 Weakness: Secondary | ICD-10-CM | POA: Diagnosis not present

## 2020-01-22 DIAGNOSIS — R0602 Shortness of breath: Secondary | ICD-10-CM | POA: Diagnosis not present

## 2020-01-22 DIAGNOSIS — Z6832 Body mass index (BMI) 32.0-32.9, adult: Secondary | ICD-10-CM

## 2020-01-22 LAB — CBC WITH DIFFERENTIAL/PLATELET
Abs Immature Granulocytes: 0.24 10*3/uL — ABNORMAL HIGH (ref 0.00–0.07)
Basophils Absolute: 0.1 10*3/uL (ref 0.0–0.1)
Basophils Relative: 1 %
Eosinophils Absolute: 0 10*3/uL (ref 0.0–0.5)
Eosinophils Relative: 0 %
HCT: 49.3 % (ref 39.0–52.0)
Hemoglobin: 16.5 g/dL (ref 13.0–17.0)
Immature Granulocytes: 2 %
Lymphocytes Relative: 12 %
Lymphs Abs: 1.2 10*3/uL (ref 0.7–4.0)
MCH: 32.9 pg (ref 26.0–34.0)
MCHC: 33.5 g/dL (ref 30.0–36.0)
MCV: 98.4 fL (ref 80.0–100.0)
Monocytes Absolute: 0.8 10*3/uL (ref 0.1–1.0)
Monocytes Relative: 8 %
Neutro Abs: 8.3 10*3/uL — ABNORMAL HIGH (ref 1.7–7.7)
Neutrophils Relative %: 77 %
Platelets: 269 10*3/uL (ref 150–400)
RBC: 5.01 MIL/uL (ref 4.22–5.81)
RDW: 11.7 % (ref 11.5–15.5)
WBC: 10.7 10*3/uL — ABNORMAL HIGH (ref 4.0–10.5)
nRBC: 0 % (ref 0.0–0.2)

## 2020-01-22 LAB — TRIGLYCERIDES: Triglycerides: 231 mg/dL — ABNORMAL HIGH (ref <150)

## 2020-01-22 LAB — PROCALCITONIN: Procalcitonin: <0.1 ng/mL

## 2020-01-22 LAB — COMPREHENSIVE METABOLIC PANEL
ALT: 31 U/L (ref 0–44)
AST: 22 U/L (ref 15–41)
Albumin: 3.4 g/dL — ABNORMAL LOW (ref 3.5–5.0)
Alkaline Phosphatase: 80 U/L (ref 38–126)
Anion gap: 11 (ref 5–15)
BUN: 15 mg/dL (ref 8–23)
CO2: 25 mmol/L (ref 22–32)
Calcium: 9.2 mg/dL (ref 8.9–10.3)
Chloride: 99 mmol/L (ref 98–111)
Creatinine, Ser: 0.69 mg/dL (ref 0.61–1.24)
GFR calc Af Amer: >60 mL/min (ref >60)
GFR calc non Af Amer: >60 mL/min (ref >60)
Glucose, Bld: 189 mg/dL — ABNORMAL HIGH (ref 70–99)
Potassium: 4.6 mmol/L (ref 3.5–5.1)
Sodium: 135 mmol/L (ref 135–145)
Total Bilirubin: 0.7 mg/dL (ref 0.3–1.2)
Total Protein: 7.3 g/dL (ref 6.5–8.1)

## 2020-01-22 LAB — FIBRINOGEN: Fibrinogen: 775 mg/dL — ABNORMAL HIGH (ref 210–475)

## 2020-01-22 LAB — FERRITIN: Ferritin: 764 ng/mL — ABNORMAL HIGH (ref 24–336)

## 2020-01-22 LAB — LACTIC ACID, PLASMA: Lactic Acid, Venous: 1.9 mmol/L (ref 0.5–1.9)

## 2020-01-22 LAB — C-REACTIVE PROTEIN: CRP: 3.4 mg/dL — ABNORMAL HIGH (ref <1.0)

## 2020-01-22 LAB — LACTATE DEHYDROGENASE: LDH: 225 U/L — ABNORMAL HIGH (ref 98–192)

## 2020-01-22 LAB — D-DIMER, QUANTITATIVE: D-Dimer, Quant: 0.51 ug/mL-FEU — ABNORMAL HIGH (ref 0.00–0.50)

## 2020-01-22 MED ORDER — OMEGA-3-ACID ETHYL ESTERS 1 G PO CAPS
1.0000 g | ORAL_CAPSULE | Freq: Every day | ORAL | Status: DC
  Administered 2020-01-22 – 2020-01-27 (×6): 1 g via ORAL
  Filled 2020-01-22 (×6): qty 1

## 2020-01-22 MED ORDER — SODIUM CHLORIDE 0.9 % IV SOLN
200.0000 mg | Freq: Once | INTRAVENOUS | Status: AC
  Administered 2020-01-22: 200 mg via INTRAVENOUS
  Filled 2020-01-22: qty 200

## 2020-01-22 MED ORDER — DEXAMETHASONE SODIUM PHOSPHATE 10 MG/ML IJ SOLN
6.0000 mg | Freq: Once | INTRAMUSCULAR | Status: AC
  Administered 2020-01-22: 6 mg via INTRAVENOUS
  Filled 2020-01-22: qty 1

## 2020-01-22 MED ORDER — DEXAMETHASONE SODIUM PHOSPHATE 10 MG/ML IJ SOLN
6.0000 mg | INTRAMUSCULAR | Status: DC
  Administered 2020-01-23 – 2020-01-27 (×5): 6 mg via INTRAVENOUS
  Filled 2020-01-22 (×5): qty 1

## 2020-01-22 MED ORDER — ACETAMINOPHEN 325 MG PO TABS: 650.0000 mg | ORAL_TABLET | Freq: Four times a day (QID) | ORAL | Status: DC | PRN

## 2020-01-22 MED ORDER — INSULIN ASPART 100 UNIT/ML ~~LOC~~ SOLN
0.0000 [IU] | Freq: Three times a day (TID) | SUBCUTANEOUS | Status: DC
  Administered 2020-01-23: 15 [IU] via SUBCUTANEOUS
  Administered 2020-01-23: 8 [IU] via SUBCUTANEOUS
  Administered 2020-01-23: 11 [IU] via SUBCUTANEOUS
  Administered 2020-01-24: 15 [IU] via SUBCUTANEOUS
  Administered 2020-01-24: 8 [IU] via SUBCUTANEOUS

## 2020-01-22 MED ORDER — METOPROLOL SUCCINATE ER 25 MG PO TB24
25.0000 mg | ORAL_TABLET | Freq: Every day | ORAL | Status: DC
  Administered 2020-01-22 – 2020-01-26 (×5): 25 mg via ORAL
  Filled 2020-01-22 (×5): qty 1

## 2020-01-22 MED ORDER — ENOXAPARIN SODIUM 40 MG/0.4ML ~~LOC~~ SOLN
40.0000 mg | SUBCUTANEOUS | Status: DC
  Administered 2020-01-23 – 2020-01-26 (×5): 40 mg via SUBCUTANEOUS
  Filled 2020-01-22 (×5): qty 0.4

## 2020-01-22 MED ORDER — SODIUM CHLORIDE 0.9 % IV SOLN
1000.0000 mL | INTRAVENOUS | Status: DC
  Administered 2020-01-22: 1000 mL via INTRAVENOUS

## 2020-01-22 MED ORDER — INSULIN ASPART 100 UNIT/ML ~~LOC~~ SOLN
0.0000 [IU] | Freq: Three times a day (TID) | SUBCUTANEOUS | Status: DC
  Filled 2020-01-22: qty 0.15

## 2020-01-22 MED ORDER — ASPIRIN EC 81 MG PO TBEC
81.0000 mg | DELAYED_RELEASE_TABLET | Freq: Every evening | ORAL | Status: DC
  Administered 2020-01-23 – 2020-01-26 (×4): 81 mg via ORAL
  Filled 2020-01-22 (×5): qty 1

## 2020-01-22 MED ORDER — AMLODIPINE BESYLATE 5 MG PO TABS
5.0000 mg | ORAL_TABLET | Freq: Every day | ORAL | Status: DC
  Administered 2020-01-23 – 2020-01-26 (×4): 5 mg via ORAL
  Filled 2020-01-22 (×5): qty 1

## 2020-01-22 MED ORDER — INSULIN ASPART 100 UNIT/ML ~~LOC~~ SOLN
0.0000 [IU] | Freq: Every day | SUBCUTANEOUS | Status: DC
  Administered 2020-01-23 – 2020-01-24 (×3): 5 [IU] via SUBCUTANEOUS

## 2020-01-22 MED ORDER — ROSUVASTATIN CALCIUM 5 MG PO TABS
5.0000 mg | ORAL_TABLET | Freq: Every day | ORAL | Status: DC
  Administered 2020-01-22 – 2020-01-27 (×6): 5 mg via ORAL
  Filled 2020-01-22 (×6): qty 1

## 2020-01-22 MED ORDER — SODIUM CHLORIDE 0.9 % IV SOLN
100.0000 mg | Freq: Every day | INTRAVENOUS | Status: AC
  Administered 2020-01-23 – 2020-01-26 (×4): 100 mg via INTRAVENOUS
  Filled 2020-01-22 (×5): qty 20

## 2020-01-22 NOTE — ED Notes (Signed)
Called report to [facility] - spoke with rn olivia  Called [facility] transfer request  Paper work complete

## 2020-01-22 NOTE — Telephone Encounter (Signed)
Patient is requesting a call back She stated that the patient tested positive for covid. She stated that the patient's breathing is not going good and he is getting weaker everyday. Patient's wife wanted to know if there is something that could be called in for him   C/B # [phone]

## 2020-01-22 NOTE — H&P (Signed)
History and Physical    Devin Huffman [id number] DOB: 06-19-1946 DOA: 01/22/2020  PCP: Ria Bush, MD  Patient coming from: Home, lives with wife  I have personally briefly reviewed patient's old medical records in [place]  Chief Complaint: Worsening shortness of breath  HPI: Devin Huffman is a 74 y.o. male with medical history significant for History of hypertension, PVD, GERD, type 2 diabetes with neuropathy, hyperlipidemia, gout, recent Covid infection who presents with worsening shortness of breath.  Patient initially started having symptoms on 1/31 and then was diagnosed with Covid on 2/4.  Since then he continues to have a cough and intermittent fever up to 101.  He recently had outpatient Bamlanivimab infusion on 01/18/20 but felt his symptoms had worsened.  He continues to feel fatigue and today he noticed increasing shortness of breath ambulating across his living room and when he tried to lay down.  He called his PCP who initially referred him to the outpatient respiratory clinic but it was closed prompting him to instead call EMS.  In the ED, he was febrile up to 100.3 and was mildly hypertensive up to systolic of [id number] and required up to 4 L via nasal cannula to maintain oxygen saturation of 94%.  Labs significant for WBC of 10.7.  Glucose of 189 other electrolytes unremarkable.  CRP at 3.4, procalcitonin of less than 0.10. D-dimer of 0.50. Chest x-ray shows heterogeneous bibasilar and right midlung opacity consistent with multifocal pneumonia.  He denies any nausea, vomiting or diarrhea abdominal pain.  Has had a good appetite and feels hungry now.  No chest pain.  Wife at home also has COVID but minimal symptoms.  He works at a [employer] and many of his coworkers had this had [place] in the past.   Denies tobacco alcohol illicit drug use.  He was started on high-dose Decadron, remdesivir, and 125 cc continuous IV fluids.    Review of  Systems Constitutional: No Weight Change, + Fever ENT/Mouth: No sore throat, No Rhinorrhea Eyes: No Eye Pain, No Vision Changes Cardiovascular: No Chest Pain, + SOB, No PND, + Dyspnea on Exertion Respiratory: + Cough, No Sputum, No Wheezing, + Dyspnea  Gastrointestinal: No Nausea, No Vomiting, No Diarrhea, No Constipation, No Pain Genitourinary: no Urinary Incontinence Musculoskeletal: No Arthralgias, No Myalgias Skin: No Skin Lesions, No Pruritus Neuro:+ Weakness, No Numbness Psych: No Anxiety/Panic, No Depression, no decrease appetite Heme/Lymph: No Bruising, No Bleeding  Past Medical History:  Diagnosis Date  . Cancer ([place])    skin cancer removed from nose  . Cerumen impaction 2016   bilateral with otitis externa s/p ENT removal - [facility]  . GERD (gastroesophageal reflux disease)    occasional  . Gout    ?due to L great toe pain, saw Dr Gladstone Lighter  . History of chicken pox   . Hyperlipidemia   . Hypertension   . Legg-Perthes disease    Right hip  . Nasal septal deviation    monitoring - [facility]  . Prediabetes 03/22/2009  . Sepsis ([place])    In March 2018    Past Surgical History:  Procedure Laterality Date  . COLONOSCOPY  03/2011   2 tubular adenomas, rec rpt 5 yrs  . COLONOSCOPY  01/2018   diverticulosis, rpt 5 yrs [facility] Plan)  . HERNIA REPAIR     double hernia  . LUMBAR FUSION  04/2013   transforaminal interbody fusion L4/5 (Dumonski) for R L5 radiculopathy, L4/5 spondylolisthesis, and L4/5 HNP with compression of  spinal cord  . TONSILLECTOMY    . TOTAL HIP ARTHROPLASTY Right 03/07/2017   Procedure: RIGHT TOTAL HIP ARTHROPLASTY ANTERIOR APPROACH;  Surgeon: Gaynelle Arabian, MD;  Location: [facility];  Service: Orthopedics;  Laterality: Right;  Marland Kitchen VASECTOMY    . WISDOM TOOTH EXTRACTION       reports that he has never smoked. He has never used smokeless tobacco. He reports that he does not drink alcohol or use drugs.  Allergies  Allergen Reactions  . Lisinopril Hives and  Swelling    ?angioedema - lip swelling, hives    Family History  Problem Relation Age of Onset  . Breast cancer Mother        with recurrence  . Alzheimer's disease Father   . Cervical cancer Sister   . Esophageal cancer Sister   . Lung cancer Sister 60  . Cancer Maternal Aunt        ?  Marland Kitchen Hypertension Maternal Uncle   . Stroke Maternal Uncle   . Coronary artery disease Paternal Uncle        MI  . Cancer Other        niece-2 types  . Stroke Maternal Grandfather      Prior to Admission medications   Medication Sig Start Date End Date Taking? Authorizing Provider  amLODipine (NORVASC) 5 MG tablet Take 1 tablet (5 mg total) by mouth at bedtime. 07/10/19  Yes Ria Bush, MD  aspirin EC 81 MG tablet Take 81 mg by mouth every evening.  04/03/17  Yes Ria Bush, MD  B Complex-Folic Acid (B COMPLEX-VITAMIN B12 PO) Take 1 tablet by mouth every evening.    Yes [provider]  Cholecalciferol (VITAMIN D3) 20 MCG TABS Take 25 mcg by mouth every evening.    Yes [provider]  Coenzyme Q10 50 MG CAPS Take 1 capsule (50 mg total) by mouth daily. Patient taking differently: Take 1 capsule by mouth every evening.  06/03/18  Yes Ria Bush, MD  indomethacin (INDOCIN) 50 MG capsule TAKE 1 CAPSULE BY MOUTH THREE TIMES DAILY AS NEEDED Patient taking differently: Take 50 mg by mouth 3 (three) times daily as needed for moderate pain. TAKE 1 CAPSULE BY MOUTH THREE TIMES DAILY AS NEEDED. 05/20/18  Yes Ria Bush, MD  metoprolol succinate (TOPROL-XL) 25 MG 24 hr tablet Take 1 tablet (25 mg total) by mouth at bedtime. 07/10/19  Yes Ria Bush, MD  Omega-3 Fatty Acids (FISH OIL) 1000 MG CAPS Take 1 capsule (1,000 mg total) by mouth 2 (two) times a day. Patient taking differently: Take 1,000 mg by mouth every evening.  07/10/19  Yes Ria Bush, MD  omeprazole (PRILOSEC) 20 MG capsule TAKE ONE CAPSULE BY MOUTH ONCE DAILY AS NEEDED Patient taking  differently: Take 20 mg by mouth daily as needed (indigestion).  10/18/17  Yes Ria Bush, MD  rosuvastatin (CRESTOR) 5 MG tablet Take 1 tablet (5 mg total) by mouth daily. 07/10/19  Yes Ria Bush, MD  vitamin C (ASCORBIC ACID) 500 MG tablet Take 500 mg by mouth every evening.    Yes [provider]  lisinopril (PRINIVIL,ZESTRIL) 5 MG tablet Take 1 tablet (5 mg total) by mouth daily. 10/02/11 10/11/18  Ria Bush, MD    Physical Exam: Vitals:   01/22/20 1800 01/22/20 1830 01/22/20 1900 01/22/20 1930  BP: (!) 160/70 (!) 150/77 (!) 151/83 (!) 174/91  Pulse: 93 95 (!) 102 (!) 101  Resp: (!) 25 19 15  (!) 25  Temp:  TempSrc:      SpO2: 94% 94% 90% 94%  Weight:        Constitutional: NAD, calm, comfortable, nontoxic appearing obese male sitting upright in bed Vitals:   01/22/20 1800 01/22/20 1830 01/22/20 1900 01/22/20 1930  BP: (!) 160/70 (!) 150/77 (!) 151/83 (!) 174/91  Pulse: 93 95 (!) 102 (!) 101  Resp: (!) 25 19 15  (!) 25  Temp:      TempSrc:      SpO2: 94% 94% 90% 94%  Weight:       Eyes: PERRL, lids and conjunctivae normal ENMT: Mucous membranes are moist.  Neck: normal, supple Respiratory: Diminished breath sounds with crackles throughout but no wheezing. Normal respiratory effort on 4 L but does become mildly tachypneic with exertion. No accessory muscle use.  Cardiovascular: Regular rate and rhythm, no murmurs / rubs / gallops. No extremity edema. 2+ pedal pulses.  Abdomen: no tenderness, no masses palpated.  Bowel sounds positive.  Musculoskeletal: no clubbing / cyanosis. No joint deformity upper and lower extremities. Good ROM, no contractures. Normal muscle tone.  Skin: no rashes, lesions, ulcers. No induration Neurologic: CN 2-12 grossly intact. Sensation intact. Strength 5/5 in all 4.  Psychiatric: Normal judgment and insight. Alert and oriented x 3. Normal mood.     Labs on Admission: I have personally reviewed following labs and  imaging studies  CBC: Recent Labs  Lab 01/22/20 1602  WBC 10.7*  NEUTROABS 8.3*  HGB 16.5  HCT 49.3  MCV 98.4  PLT [id number]   Basic Metabolic Panel: Recent Labs  Lab 01/22/20 1602  NA 135  K 4.6  CL 99  CO2 25  GLUCOSE 189*  BUN 15  CREATININE 0.69  CALCIUM 9.2   GFR: Estimated Creatinine Clearance: 95.9 mL/min (by C-G formula based on SCr of 0.69 mg/dL). Liver Function Tests: Recent Labs  Lab 01/22/20 1602  AST 22  ALT 31  ALKPHOS 80  BILITOT 0.7  PROT 7.3  ALBUMIN 3.4*   No results for input(s): LIPASE, AMYLASE in the last 168 hours. No results for input(s): AMMONIA in the last 168 hours. Coagulation Profile: No results for input(s): INR, PROTIME in the last 168 hours. Cardiac Enzymes: No results for input(s): CKTOTAL, CKMB, CKMBINDEX, TROPONINI in the last 168 hours. BNP (last 3 results) No results for input(s): PROBNP in the last 8760 hours. HbA1C: No results for input(s): HGBA1C in the last 72 hours. CBG: No results for input(s): GLUCAP in the last 168 hours. Lipid Profile: Recent Labs    01/22/20 1602  TRIG 231*   Thyroid Function Tests: No results for input(s): TSH, T4TOTAL, FREET4, T3FREE, THYROIDAB in the last 72 hours. Anemia Panel: Recent Labs    01/22/20 1602  FERRITIN 764*   Urine analysis:    Component Value Date/Time   COLORURINE YELLOW 03/26/2017 1458   APPEARANCEUR HAZY (A) 03/26/2017 1458   LABSPEC 1.025 03/26/2017 1458   PHURINE 6.0 03/26/2017 1458   GLUCOSEU NEGATIVE 03/26/2017 1458   HGBUR MODERATE (A) 03/26/2017 1458   BILIRUBINUR negative 11/29/2018 0821   KETONESUR NEGATIVE 03/26/2017 1458   PROTEINUR Negative 11/29/2018 0821   PROTEINUR 30 (A) 03/26/2017 1458   UROBILINOGEN 0.2 11/29/2018 0821   UROBILINOGEN 0.2 04/15/2013 0838   NITRITE negative 11/29/2018 0821   NITRITE NEGATIVE 03/26/2017 1458   LEUKOCYTESUR Small (1+) (A) 11/29/2018 0821    Radiological Exams on Admission: DG Chest Port 1 View  Result  Date: 01/22/2020 CLINICAL DATA:  Worsening shortness of breath.  COVID diagnosis 01/15/2020. EXAM: PORTABLE CHEST 1 VIEW COMPARISON:  Radiograph 03/26/2017 FINDINGS: Patchy and streaky opacities in both lung bases and right midlung zone. Overall lung volumes are low. Unchanged heart size and mediastinal contours. No pulmonary edema or large pleural effusion. No pneumothorax. No acute osseous abnormalities are seen. IMPRESSION: Heterogeneous bibasilar and right midlung opacities consistent with multifocal pneumonia, pattern typical of COVID-19. Electronically Signed   By: Keith Rake M.D.   On: 01/22/2020 16:29    EKG: Independently reviewed.   Assessment/Plan  Acute hypoxic respiratory failure secondary to COVID pneumonia On 4 L  Maintain O2 > 92%  Continue IV Decadron  Continue Remdesivir Monitor inflammatory markers  Hypertension Mildly hypertensive on admit  Continue metoprolol and amlodipine  Type 2 diabetes with peripheral neuropathy Last hemoglobin A1c of 7.3 in 06/2019.  Will repeat. Will place on SSI while on steroids  PVD Continue aspirin  Hyperlipidemia Continue statin  Morbid obesity BMI greater than 32  DVT prophylaxis:.Lovenox Code Status: Full Family Communication: Plan discussed with patient at bedside  disposition Plan: Home with at least 2 midnight stays   The appropriate patient status for this patient is INPATIENT. Inpatient status is judged to be reasonable and necessary in order to provide the required intensity of service to ensure the patient's safety. The patient's presenting symptoms, physical exam findings, and initial radiographic and laboratory data in the context of their chronic comorbidities is felt to place them at high risk for further clinical deterioration. Furthermore, it is not anticipated that the patient will be medically stable for discharge from the hospital within 2 midnights of admission. The following factors support the patient  status of inpatient.    "           The patient's presenting symptoms of acute hypoxic respiratory failure "           The worrisome physical exam findings include multifocal Covid pneumonia "           The initial radiographic and laboratory data are worrisome because Covid infection elevated inflammatory markers. "           The chronic co-morbidities include multiple.     * I certify that at the point of admission it is my clinical judgment that the patient will require inpatient hospital care spanning beyond 2 midnights from the point of admission due to high intensity of service, high risk for further deterioration and high frequency of surveillance required.*  Consults called:  Admission status: inpatient and will admit to [facility] due to Covid infection   Orene Desanctis DO [facility]   If 7PM-7AM, please contact night-coverage [email or web address]   01/22/2020, 8:42 PM

## 2020-01-22 NOTE — Telephone Encounter (Signed)
Spoke with wife. EMS currently at the home evaluating patient - will go to ER for evaluation.

## 2020-01-22 NOTE — ED Notes (Signed)
Pt provided with [place] sandwich and coke. NAD noted. Pt updated on plan of care.

## 2020-01-22 NOTE — ED Notes (Signed)
Phlebotomy notified need for 2nd set of [facility]

## 2020-01-22 NOTE — Telephone Encounter (Signed)
Patient's wife called to schedule appointment at the respiratory clinic. The clinic is closed this afternoon. Patient's wife said she has covid, so she didn't want to take him to urgent care.  I let her know she could call 911 and they could come out and evaluate him and determine if he needed to go to the hospital.  She said she'd talk to him, but she didn't know if he would let her call 911.  I asked her to call me back,if he wouldn't let her call 911.  I'm not sure if she'll call me back.

## 2020-01-22 NOTE — ED Provider Notes (Addendum)
[facility] DEPT Provider Note   CSN: [id number] Arrival date & time: 01/22/20  1458     History Chief Complaint  Patient presents with  . COVID Positive  . Shortness of Breath    Devin Huffman is a 74 y.o. male.  Chief complaint dyspnea.  Known diagnosis of Covid on 01/15/2020.  He received a bamlanivimab transfusion on 01/18/20.  No fever, sweats, chills, chest pain.  He becomes very short of breath after ambulating a minimal distance.  No other obvious concerns.  Severity is moderate.  Exertion makes symptoms worse.  Pulse ox mid 80s on room air        Past Medical History:  Diagnosis Date  . Cancer ([place])    skin cancer removed from nose  . Cerumen impaction 2016   bilateral with otitis externa s/p ENT removal - [facility]  . GERD (gastroesophageal reflux disease)    occasional  . Gout    ?due to L great toe pain, saw Dr Gladstone Lighter  . History of chicken pox   . Hyperlipidemia   . Hypertension   . Legg-Perthes disease    Right hip  . Nasal septal deviation    monitoring - [facility]  . Prediabetes 03/22/2009  . Sepsis ([place])    In March 2018    Patient Active Problem List   Diagnosis Date Noted  . COVID-19 virus infection 01/21/2020  . PVD (peripheral vascular disease) ([place]) 09/10/2019  . Hematuria 11/29/2018  . Right wrist pain 10/13/2018  . Encounter for routine adult medical exam with abnormal findings 05/28/2017  . Foot pain, bilateral 05/28/2017  . Increased prostate specific antigen (PSA) velocity 05/28/2017  . Skin rash 05/28/2017  . Tachycardia 03/26/2017  . History of right hip replacement 03/26/2017  . OA (osteoarthritis) of hip 03/07/2017  . 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 Type 2 diabetes mellitus with diabetic neuropathy, unspecified ([place]) 08/31/2016  . Obesity, Class I, BMI 30-34.9 05/14/2015  . Advanced care planning/counseling discussion 11/11/2014  . Vitamin B12 deficiency 10/15/2012  . Medicare annual  wellness visit, subsequent 10/02/2011  . GERD (gastroesophageal reflux disease) 10/02/2011  . Dyslipidemia 07/15/2009  . Gout 07/15/2009  . Type 2 diabetes mellitus with other specified complication ([place]) [id number]  . Essential hypertension 06/23/2008    Past Surgical History:  Procedure Laterality Date  . COLONOSCOPY  03/2011   2 tubular adenomas, rec rpt 5 yrs  . COLONOSCOPY  01/2018   diverticulosis, rpt 5 yrs [facility])  . HERNIA REPAIR     double hernia  . 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 TONSILLECTOMY    . TOTAL HIP ARTHROPLASTY Right 03/07/2017   Procedure: RIGHT TOTAL HIP ARTHROPLASTY ANTERIOR APPROACH;  Surgeon: Gaynelle Arabian, MD;  Location: [facility];  Service: Orthopedics;  Laterality: Right;  Marland Kitchen VASECTOMY    . WISDOM TOOTH EXTRACTION         Family History  Problem Relation Age of Onset  . Breast cancer Mother        with recurrence  . Alzheimer's disease Father   . Cervical cancer Sister   . Esophageal cancer Sister   . Lung cancer Sister 29  . Cancer Maternal Aunt        ?  Marland Kitchen Hypertension Maternal Uncle   . Stroke Maternal Uncle   . Coronary artery disease Paternal Uncle  MI  . Cancer Other        niece-2 types  . Stroke Maternal Grandfather     Social History   Tobacco Use  . Smoking status: Never Smoker  . Smokeless tobacco: Never Used  Substance Use Topics  . Alcohol use: No  . Drug use: No    Home Medications Prior to Admission medications   Medication Sig Start Date End Date Taking? Authorizing Provider  amLODipine (NORVASC) 5 MG tablet Take 1 tablet (5 mg total) by mouth at bedtime. 07/10/19  Yes Ria Bush, MD  aspirin EC 81 MG tablet Take 81 mg by mouth every evening.  04/03/17  Yes Ria Bush, MD  B Complex-Folic Acid (B COMPLEX-VITAMIN B12 PO) Take 1 tablet by mouth every evening.    Yes  [provider]  Cholecalciferol (VITAMIN D3) 20 MCG TABS Take 25 mcg by mouth every evening.    Yes [provider]  Coenzyme Q10 50 MG CAPS Take 1 capsule (50 mg total) by mouth daily. Patient taking differently: Take 1 capsule by mouth every evening.  06/03/18  Yes Ria Bush, MD  indomethacin (INDOCIN) 50 MG capsule TAKE 1 CAPSULE BY MOUTH THREE TIMES DAILY AS NEEDED Patient taking differently: Take 50 mg by mouth 3 (three) times daily as needed for moderate pain. TAKE 1 CAPSULE BY MOUTH THREE TIMES DAILY AS NEEDED. 05/20/18  Yes Ria Bush, MD  metoprolol succinate (TOPROL-XL) 25 MG 24 hr tablet Take 1 tablet (25 mg total) by mouth at bedtime. 07/10/19  Yes Ria Bush, MD  Omega-3 Fatty Acids (FISH OIL) 1000 MG CAPS Take 1 capsule (1,000 mg total) by mouth 2 (two) times a day. Patient taking differently: Take 1,000 mg by mouth every evening.  07/10/19  Yes Ria Bush, MD  omeprazole (PRILOSEC) 20 MG capsule TAKE ONE CAPSULE BY MOUTH ONCE DAILY AS NEEDED Patient taking differently: Take 20 mg by mouth daily as needed (indigestion).  10/18/17  Yes Ria Bush, MD  rosuvastatin (CRESTOR) 5 MG tablet Take 1 tablet (5 mg total) by mouth daily. 07/10/19  Yes Ria Bush, MD  vitamin C (ASCORBIC ACID) 500 MG tablet Take 500 mg by mouth every evening.    Yes [provider]  lisinopril (PRINIVIL,ZESTRIL) 5 MG tablet Take 1 tablet (5 mg total) by mouth daily. 10/02/11 10/11/18  Ria Bush, MD    Allergies    Lisinopril  Review of Systems   Review of Systems  All other systems reviewed and are negative.   Physical Exam Updated Vital Signs BP (!) 160/70   Pulse 93   Temp 100.3 F (37.9 C) (Oral)   Resp (!) 25   Wt 103 kg   SpO2 94%   BMI 32.57 kg/m   Physical Exam Vitals and nursing note reviewed.  Constitutional:      Appearance: He is well-developed.     Comments: nad  HENT:     Head: Normocephalic and  atraumatic.  Eyes:     Conjunctiva/sclera: Conjunctivae normal.  Cardiovascular:     Rate and Rhythm: Normal rate and regular rhythm.  Pulmonary:     Effort: Pulmonary effort is normal.     Comments: Decreased breath sounds bilaterally Abdominal:     General: Bowel sounds are normal.     Palpations: Abdomen is soft.  Musculoskeletal:        General: Normal range of motion.     Cervical back: Neck supple.  Skin:    General: Skin is warm and  dry.  Neurological:     General: No focal deficit present.     Mental Status: He is alert and oriented to person, place, and time.  Psychiatric:        Behavior: Behavior normal.     ED Results / Procedures / Treatments   Labs (all labs ordered are listed, but only abnormal results are displayed) Labs Reviewed  CBC WITH DIFFERENTIAL/PLATELET - Abnormal; Notable for the following components:      Result Value   WBC 10.7 (*)    Neutro Abs 8.3 (*)    Abs Immature Granulocytes 0.24 (*)    All other components within normal limits  COMPREHENSIVE METABOLIC PANEL - Abnormal; Notable for the following components:   Glucose, Bld 189 (*)    Albumin 3.4 (*)    All other components within normal limits  D-DIMER, QUANTITATIVE (NOT AT [facility]) - Abnormal; Notable for the following components:   D-Dimer, Quant 0.51 (*)    All other components within normal limits  LACTATE DEHYDROGENASE - Abnormal; Notable for the following components:   LDH 225 (*)    All other components within normal limits  FERRITIN - Abnormal; Notable for the following components:   Ferritin 764 (*)    All other components within normal limits  FIBRINOGEN - Abnormal; Notable for the following components:   Fibrinogen 775 (*)    All other components within normal limits  C-REACTIVE PROTEIN - Abnormal; Notable for the following components:   CRP 3.4 (*)    All other components within normal limits  CULTURE, BLOOD (ROUTINE X 2)  CULTURE, BLOOD (ROUTINE X 2)  SARS CORONAVIRUS 2  (TAT 6-24 HRS)  LACTIC ACID, PLASMA  PROCALCITONIN  TRIGLYCERIDES    EKG EKG Interpretation  Date/Time:  Thursday January 22 2020 15:48:19 EST Ventricular Rate:  86 PR Interval:    QRS Duration: 92 QT Interval:  365 QTC Calculation: 437 R Axis:   -57 Text Interpretation: Sinus rhythm Left anterior fascicular block Abnormal R-wave progression, late transition Left ventricular hypertrophy Confirmed by Nat Christen [phone]) on 01/22/2020 5:23:50 PM   Radiology DG Chest Port 1 View  Result Date: 01/22/2020 CLINICAL DATA:  Worsening shortness of breath. COVID diagnosis 01/15/2020. EXAM: PORTABLE CHEST 1 VIEW COMPARISON:  Radiograph 03/26/2017 FINDINGS: Patchy and streaky opacities in both lung bases and right midlung zone. Overall lung volumes are low. Unchanged heart size and mediastinal contours. No pulmonary edema or large pleural effusion. No pneumothorax. No acute osseous abnormalities are seen. IMPRESSION: Heterogeneous bibasilar and right midlung opacities consistent with multifocal pneumonia, pattern typical of COVID-19. Electronically Signed   By: Keith Rake M.D.   On: 01/22/2020 16:29    Procedures Procedures (including critical care time)  Medications Ordered in ED Medications  0.9 %  sodium chloride infusion (1,000 mLs Intravenous New Bag/Given (Non-Interop) 01/22/20 1651)  remdesivir 200 mg in sodium chloride 0.9% 250 mL IVPB (200 mg Intravenous New Bag/Given (Non-Interop) 01/22/20 1651)    Followed by  remdesivir 100 mg in sodium chloride 0.9 % 100 mL IVPB (has no administration in time range)  dexamethasone (DECADRON) injection 6 mg (6 mg Intravenous Given 01/22/20 1650)    ED Course  I have reviewed the triage vital signs and the nursing notes.  Pertinent labs & imaging results that were available during my care of the patient were reviewed by me and considered in my medical decision making (see chart for details).    MDM Rules/Calculators/A&P  Known Covid positive patient with increasing dyspnea.  Will initiate typical work-up including IV steroids, IV remdesivir, probable admission.   Patient desats on room air.  Chest x-ray reviewed.  Admit for further observation. CRITICAL CARE Performed by: Nat Christen Total critical care time: 30 minutes Critical care time was exclusive of separately billable procedures and treating other patients. Critical care was necessary to treat or prevent imminent or life-threatening deterioration. Critical care was time spent personally by me on the following activities: development of treatment plan with patient and/or surrogate as well as nursing, discussions with consultants, evaluation of patient's response to treatment, examination of patient, obtaining history from patient or surrogate, ordering and performing treatments and interventions, ordering and review of laboratory studies, ordering and review of radiographic studies, pulse oximetry and re-evaluation of patient's condition. Final Clinical Impression(s) / ED Diagnoses Final diagnoses:  [id number]    Rx / DC Orders ED Discharge Orders    None       Nat Christen, MD 01/22/20 1601    Nat Christen, MD 01/22/20 [phone]

## 2020-01-22 NOTE — Telephone Encounter (Addendum)
Per pt's wife, Hassan Rowan, pt has been taken to [facility] via EMS due to worsening dyspnea and low O2 sat.  Dr. Darnell Level is aware.  [see TE, 01/22/20.]

## 2020-01-22 NOTE — ED Notes (Signed)
ED TO INPATIENT HANDOFF REPORT  ED Nurse Name and Phone #: jon wled   S Name/Age/Gender [name] 74 y.o. male Room/Bed: WA08/WA08  Code Status   Code Status: Full Code  Home/SNF/Other Home Patient oriented to: self, place, time and situation Is this baseline? Yes   Triage Complete: Triage complete  Chief Complaint Pneumonia due to COVID-19 virus [U07.1, J12.82]  Triage Note Pt BIBA from home.   Per EMS- Pt reports being dx with COVID 01/15/20.  Pt reports sx started 01/12/20.  Pt reports increased SHOB with exertion, EMS reports rhonci bilaterally. O2 sats 86% on RA, 4L O2 93%.  Denies chest pain, denies n/v.    AOx4, ambulatory with assistance d/t weakness.     Allergies Allergies  Allergen Reactions  . Lisinopril Hives and Swelling    ?angioedema - lip swelling, hives    Level of Care/Admitting Diagnosis ED Disposition    ED Disposition Condition [facility]: [place] [[id number]]  Level of Care: Telemetry [5]  Covid Evaluation: Confirmed COVID Positive  Diagnosis: Pneumonia due to COVID-19 virus [id number]  Admitting Physician: Orene Desanctis [id number]  Attending Physician: Orene Desanctis [id number]  Estimated length of stay: past midnight tomorrow  Certification:: I certify this patient will need inpatient services for at least 2 midnights       B Medical/Surgery History Past Medical History:  Diagnosis Date  . Cancer ([place])    skin cancer removed from nose  . Cerumen impaction 2016   bilateral with otitis externa s/p ENT removal - [place]  . GERD (gastroesophageal reflux disease)    occasional  . Gout    ?due to L great toe pain, saw Dr Gladstone Lighter  . History of chicken pox   . Hyperlipidemia   . Hypertension   . Legg-Perthes disease    Right hip  . Nasal septal deviation    monitoring - [place]  . Prediabetes 03/22/2009  . Sepsis ([place])    In March 2018   Past Surgical History:  Procedure Laterality Date   . COLONOSCOPY  03/2011   2 tubular adenomas, rec rpt 5 yrs  . COLONOSCOPY  01/2018   diverticulosis, rpt 5 yrs [facility])  . HERNIA REPAIR     double hernia  . 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 TONSILLECTOMY    . TOTAL HIP ARTHROPLASTY Right 03/07/2017   Procedure: RIGHT TOTAL HIP ARTHROPLASTY ANTERIOR APPROACH;  Surgeon: Gaynelle Arabian, MD;  Location: [facility];  Service: Orthopedics;  Laterality: Right;  Marland Kitchen VASECTOMY    . WISDOM TOOTH EXTRACTION       A IV Location/Drains/Wounds Patient Lines/Drains/Airways Status   Active Line/Drains/Airways    Name:   Placement date:   Placement time:   Site:   Days:   Peripheral IV 01/22/20 Anterior;Left Hand   01/22/20    --    Hand   less than 1   Peripheral IV 01/22/20 Left Antecubital   01/22/20    1600    Antecubital   less than 1   Incision 04/23/13 Back   04/23/13    1741     2465   Incision (Closed) 03/07/17 Hip Right   03/07/17    1041     1051          Intake/Output Last 24 hours No intake or  output data in the 24 hours ending 01/22/20 2048  Labs/Imaging Results for orders placed or performed during the hospital encounter of 01/22/20 (from the past 48 hour(s))  Lactic acid, plasma     Status: None   Collection Time: 01/22/20  3:46 PM  Result Value Ref Range   Lactic Acid, Venous 1.9 0.5 - 1.9 mmol/L    Comment: Performed at [facility], [place]  [address] [place], [place] [address]  CBC WITH DIFFERENTIAL     Status: Abnormal   Collection Time: 01/22/20  4:02 PM  Result Value Ref Range   WBC 10.7 (H) 4.0 - 10.5 K/uL   RBC 5.01 4.22 - 5.81 MIL/uL   Hemoglobin 16.5 13.0 - 17.0 g/dL   HCT 49.3 39.0 - 52.0 %   MCV 98.4 80.0 - 100.0 fL   MCH 32.9 26.0 - 34.0 pg   MCHC 33.5 30.0 - 36.0 g/dL   RDW 11.7 11.5 - 15.5 %   Platelets 269 150 - 400 K/uL   nRBC 0.0 0.0 - 0.2 %   Neutrophils  Relative % 77 %   Neutro Abs 8.3 (H) 1.7 - 7.7 K/uL   Lymphocytes Relative 12 %   Lymphs Abs 1.2 0.7 - 4.0 K/uL   Monocytes Relative 8 %   Monocytes Absolute 0.8 0.1 - 1.0 K/uL   Eosinophils Relative 0 %   Eosinophils Absolute 0.0 0.0 - 0.5 K/uL   Basophils Relative 1 %   Basophils Absolute 0.1 0.0 - 0.1 K/uL   Immature Granulocytes 2 %   Abs Immature Granulocytes 0.24 (H) 0.00 - 0.07 K/uL    Comment: Performed at [facility], [place] [address], [place], [place] [address]  Comprehensive metabolic panel     Status: Abnormal   Collection Time: 01/22/20  4:02 PM  Result Value Ref Range   Sodium 135 135 - 145 mmol/L   Potassium 4.6 3.5 - 5.1 mmol/L   Chloride 99 98 - 111 mmol/L   CO2 25 22 - 32 mmol/L   Glucose, Bld 189 (H) 70 - 99 mg/dL   BUN 15 8 - 23 mg/dL   Creatinine, Ser 0.69 0.61 - 1.24 mg/dL   Calcium 9.2 8.9 - 10.3 mg/dL   Total Protein 7.3 6.5 - 8.1 g/dL   Albumin 3.4 (L) 3.5 - 5.0 g/dL   AST 22 15 - 41 U/L   ALT 31 0 - 44 U/L   Alkaline Phosphatase 80 38 - 126 U/L   Total Bilirubin 0.7 0.3 - 1.2 mg/dL   GFR calc non Af Amer >60 >60 mL/min   GFR calc Af Amer >60 >60 mL/min   Anion gap 11 5 - 15    Comment: Performed at [facility], [place] [address] [place], [place] [address]  D-dimer, quantitative     Status: Abnormal   Collection Time: 01/22/20  4:02 PM  Result Value Ref Range   D-Dimer, Quant 0.51 (H) 0.00 - 0.50 ug/mL-FEU    Comment: (NOTE) At the manufacturer cut-off of 0.50 ug/mL FEU, this assay has been documented to exclude PE with a sensitivity and negative predictive value of 97 to 99%.  At this time, this assay has not been approved by the FDA to exclude DVT/VTE. Results should be correlated with clinical presentation. Performed at [facility], [place] [address], [place], [place] [address]   Procalcitonin     Status: None   Collection Time: 01/22/20  4:02 PM  Result Value  Ref Range   Procalcitonin  <0.10 ng/mL    Comment:        Interpretation: PCT (Procalcitonin) <= 0.5 ng/mL: Systemic infection (sepsis) is not likely. Local bacterial infection is possible. (NOTE)       Sepsis PCT Algorithm           Lower Respiratory Tract                                      Infection PCT Algorithm    ----------------------------     ----------------------------         PCT < 0.25 ng/mL                PCT < 0.10 ng/mL         Strongly encourage             Strongly discourage   discontinuation of antibiotics    initiation of antibiotics    ----------------------------     -----------------------------       PCT 0.25 - 0.50 ng/mL            PCT 0.10 - 0.25 ng/mL               OR       >80% decrease in PCT            Discourage initiation of                                            antibiotics      Encourage discontinuation           of antibiotics    ----------------------------     -----------------------------         PCT >= 0.50 ng/mL              PCT 0.26 - 0.50 ng/mL               AND        <80% decrease in PCT             Encourage initiation of                                             antibiotics       Encourage continuation           of antibiotics    ----------------------------     -----------------------------        PCT >= 0.50 ng/mL                  PCT > 0.50 ng/mL               AND         increase in PCT                  Strongly encourage                                      initiation of antibiotics    Strongly encourage escalation           of  antibiotics                                     -----------------------------                                           PCT <= 0.25 ng/mL                                                 OR                                        > 80% decrease in PCT                                     Discontinue / Do not initiate                                             antibiotics Performed at [place]  [address], [place], [place] [address]   Lactate dehydrogenase     Status: Abnormal   Collection Time: 01/22/20  4:02 PM  Result Value Ref Range   LDH 225 (H) 98 - 192 U/L    Comment: Performed at [facility], [place] [address], [place], [place] [address]  Ferritin     Status: Abnormal   Collection Time: 01/22/20  4:02 PM  Result Value Ref Range   Ferritin 764 (H) 24 - 336 ng/mL    Comment: Performed at [facility], [place] [address] [place], [place] [address]  Triglycerides     Status: Abnormal   Collection Time: 01/22/20  4:02 PM  Result Value Ref Range   Triglycerides 231 (H) <150 mg/dL    Comment: Performed at [facility], [place] [address], [place], [place] [address]  Fibrinogen     Status: Abnormal   Collection Time: 01/22/20  4:02 PM  Result Value Ref Range   Fibrinogen 775 (H) 210 - 475 mg/dL    Comment: Performed at [facility], [place] [address] [place], [place] [address]  C-reactive protein     Status: Abnormal   Collection Time: 01/22/20  4:02 PM  Result Value Ref Range   CRP 3.4 (H) <1.0 mg/dL    Comment: Performed at [facility], [place] [address], [place],  [address]   DG Chest Port 1 View  Result Date: 01/22/2020 CLINICAL DATA:  Worsening shortness of breath. COVID diagnosis 01/15/2020. EXAM: PORTABLE CHEST 1 VIEW COMPARISON:  Radiograph 03/26/2017 FINDINGS: Patchy and streaky opacities in both lung bases and right midlung zone. Overall lung volumes are low. Unchanged heart size and mediastinal contours. No pulmonary edema or large pleural effusion. No pneumothorax. No acute osseous abnormalities are seen. IMPRESSION: Heterogeneous bibasilar and right midlung opacities consistent with multifocal pneumonia, pattern typical of COVID-19. Electronically Signed   By:  Keith Rake M.D.   On: 01/22/2020 16:29    Pending Labs Unresulted Labs (From admission, onward)    Start      Ordered   01/23/20 0500  CBC with Differential/Platelet  Daily,   R     01/22/20 2018   01/23/20 0500  Comprehensive metabolic panel  Daily,   R     01/22/20 2018   01/23/20 0500  C-reactive protein  Daily,   R     01/22/20 2018   01/23/20 0500  D-dimer, quantitative (not at [facility])  Daily,   R     01/22/20 2018   01/22/20 2015  ABO/Rh  Once,   STAT     01/22/20 2018   01/22/20 1548  SARS CORONAVIRUS 2 (TAT 6-24 HRS) Nasopharyngeal Nasopharyngeal Swab  (Novel Coronavirus, NAA [facility] Order))  Once,   STAT    Question Answer Comment  Is this test for diagnosis or screening Diagnosis of ill patient   Symptomatic for COVID-19 as defined by CDC Yes   Date of Symptom Onset 01/13/2020   Hospitalized for COVID-19 Yes   Admitted to ICU for COVID-19 No   Previously tested for COVID-19 Yes   Resident in a congregate (group) care setting No   Employed in healthcare setting No      01/22/20 1547   01/22/20 1546  Blood Culture (routine x 2)  BLOOD CULTURE X 2,   STAT     01/22/20 1547          Vitals/Pain Today's Vitals   01/22/20 1830 01/22/20 1900 01/22/20 1930 01/22/20 2000  BP: (!) 150/77 (!) 151/83 (!) 174/91 (!) 181/96  Pulse: 95 (!) 102 (!) 101 100  Resp: 19 15 (!) 25 (!) 25  Temp:      TempSrc:      SpO2: 94% 90% 94% 92%  Weight:      PainSc:        Isolation Precautions Airborne and Contact precautions  Medications Medications  0.9 %  sodium chloride infusion (1,000 mLs Intravenous New Bag/Given (Non-Interop) 01/22/20 1651)  remdesivir 200 mg in sodium chloride 0.9% 250 mL IVPB (0 mg Intravenous Stopped 01/22/20 1945)    Followed by  remdesivir 100 mg in sodium chloride 0.9 % 100 mL IVPB (has no administration in time range)  enoxaparin (LOVENOX) injection 40 mg (has no administration in time range)  dexamethasone (DECADRON) injection 6 mg (has no administration in time range)  dexamethasone (DECADRON) injection 6 mg (6 mg Intravenous Given 01/22/20 1650)     Mobility walks with person assist     Focused Assessments Pulmonary Assessment Handoff:  Lung sounds:   O2 Device: Nasal Cannula O2 Flow Rate (L/min): 4 L/min      R Recommendations: See Admitting Provider Note  Report given to:   Additional Notes:

## 2020-01-22 NOTE — ED Notes (Signed)
Phlebotomy to bedside.  

## 2020-01-22 NOTE — ED Notes (Signed)
New report called to [place]  Nurse in different assignment at time

## 2020-01-22 NOTE — ED Triage Notes (Signed)
Pt BIBA from home.   Per EMS- Pt reports being dx with COVID 01/15/20.  Pt reports sx started 01/12/20.  Pt reports increased SHOB with exertion, EMS reports rhonci bilaterally. O2 sats 86% on RA, 4L O2 93%.  Denies chest pain, denies n/v.    AOx4, ambulatory with assistance d/t weakness.

## 2020-01-22 NOTE — Telephone Encounter (Signed)
Spoke with wife - progressive dyspnea with exertion, getting worse. Progressive to worsening fatigue.  Denies dyspnea at rest.  I recommend in person evaluation. If she thinks they can get to the car and go to [facility] or [place] respiratory clinic may do that. If not, then recommend call 911 for EMS evaluation.  She will talk with husband (currently in bed) and call us back.

## 2020-01-23 ENCOUNTER — Ambulatory Visit: Payer: Medicare Other

## 2020-01-23 LAB — GLUCOSE, CAPILLARY
Glucose-Capillary: 363 mg/dL — ABNORMAL HIGH (ref 70–99)
Glucose-Capillary: 284 mg/dL — ABNORMAL HIGH (ref 70–99)
Glucose-Capillary: 333 mg/dL — ABNORMAL HIGH (ref 70–99)
Glucose-Capillary: 382 mg/dL — ABNORMAL HIGH (ref 70–99)
Glucose-Capillary: 360 mg/dL — ABNORMAL HIGH (ref 70–99)

## 2020-01-23 LAB — SARS CORONAVIRUS 2 (TAT 6-24 HRS): SARS Coronavirus 2: POSITIVE — AB

## 2020-01-23 MED ORDER — PANTOPRAZOLE SODIUM 40 MG PO TBEC
40.0000 mg | DELAYED_RELEASE_TABLET | Freq: Every day | ORAL | Status: DC
  Administered 2020-01-23 – 2020-01-27 (×5): 40 mg via ORAL
  Filled 2020-01-23 (×5): qty 1

## 2020-01-23 MED ORDER — HYDRALAZINE HCL 20 MG/ML IJ SOLN
5.0000 mg | Freq: Four times a day (QID) | INTRAMUSCULAR | Status: DC | PRN
  Administered 2020-01-23: 5 mg via INTRAVENOUS
  Filled 2020-01-23: qty 1

## 2020-01-23 NOTE — Progress Notes (Signed)
Spoke with patient's wife and gave update and answered all questions.  Gave phone number so that she could contact me later if she thought of any questions.

## 2020-01-23 NOTE — Progress Notes (Signed)
PROGRESS NOTE    Devin Huffman  [id number] DOB: 11/16/1946 DOA: 01/22/2020 PCP: Ria Bush, MD   Brief Narrative:  Devin Huffman is a 74 y.o. male with medical history significant for History of hypertension, PVD, GERD, type 2 diabetes with neuropathy, hyperlipidemia, gout, recent Covid infection who presents with worsening shortness of breath. Patient initially started having symptoms on 1/31 and then was diagnosed with Covid on 2/4.  Since then he continues to have a cough and intermittent fever up to 101.  He recently had outpatient Bamlanivimab infusion on 01/18/20 but felt his symptoms had worsened.  He continues to feel fatigue and today he noticed increasing shortness of breath ambulating across his living room and when he tried to lay down.  He called his PCP who initially referred him to the outpatient respiratory clinic but it was closed prompting him to instead call EMS. In the ED, he was febrile up to 100.3 and was mildly hypertensive up to systolic of [id number] and required up to 4 L via nasal cannula to maintain oxygen saturation of 94%. He denies any nausea, vomiting or diarrhea abdominal pain.  Has had a good appetite and feels hungry now.  No chest pain.  Wife at home also has COVID but minimal symptoms.  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 He was started on high-dose Decadron, remdesivir, and 125 cc continuous IV fluids.  Assessment & Plan:   Principal Problem:   Pneumonia due to COVID-19 virus Active Problems:   Dyslipidemia   Essential hypertension   Type 2 diabetes mellitus with other specified complication (HCC)   Obesity, Class I, BMI 30-34.9   PVD (peripheral vascular disease) (HCC)   Respiratory failure with hypoxia (HCC)   Acute hypoxic respiratory failure secondary to COVID pneumonia Moderate to severe disease SpO2: 94 % O2 Flow Rate (L/min): 6 L/min Worsening oxygen  Maintain O2 > 88-92% Recent Labs   01/22/20 1602  DDIMER 0.51*  FERRITIN 764*  LDH 225*  CRP 3.4*  Continue IV Decadron through 01/30/20 Continue Remdesivir through 01/26/20 Monitor inflammatory markers  Hypertension, essential Mildly hypertensive on admit  Continue metoprolol and amlodipine  Type 2 diabetes with peripheral neuropathy Last hemoglobin A1c of 7.3 in 06/2019.  Will repeat. Will place on SSI while on steroids  PVD Continue aspirin  Hyperlipidemia Continue statin  Morbid obesity BMI greater than 32  DVT prophylaxis:.Lovenox Code Status: Full Family Communication: Plan discussed with patient at bedside  Disposition Plan: Home after completion of therapy and improvement in symptoms  Subjective: No acute issues or events overnight, respiratory status continues to be somewhat tenuous, patient ports ongoing dyspnea even at rest as well as marked fatigue.  Denies chest pain, headache, fevers, chills.  Objective: Vitals:   01/23/20 1131 01/23/20 1200 01/23/20 1230 01/23/20 1300  BP: (!) 167/82     Pulse: 100 93 92 91  Resp: 20 (!) 21 19 19   Temp: 97.6 F (36.4 C)     TempSrc: Oral     SpO2: (!) 89% 90% 91% 94%  Weight:      Height:        Intake/Output Summary (Last 24 hours) at 01/23/2020 1506 Last data filed at 01/23/2020 1300 Gross per 24 hour  Intake 580 ml  Output 1475 ml  Net -895 ml   Filed Weights   01/22/20 1536 01/23/20 0000  Weight: 103 kg 103.7 kg    Examination:  General: Pleasantly resting in bed, No acute distress. HEENT: Normocephalic atraumatic.  Sclerae nonicteric, noninjected.  Extraocular movements intact bilaterally. Neck: Without mass or deformity.  Trachea is midline. Lungs: Coarse breath sounds bilaterally, moderate rhonchi without overt wheeze or rales. Heart: Regular rate and rhythm.  Without murmurs, rubs, or gallops. Abdomen: Soft, nontender, nondistended.  Without guarding or rebound. Extremities: Without cyanosis, clubbing, edema, or obvious  deformity. Vascular: Dorsalis pedis and posterior tibial pulses palpable bilaterally. Skin:  Warm and dry, no erythema, no ulcerations.   Data Reviewed: I have personally reviewed following labs and imaging studies  CBC: Recent Labs  Lab 01/22/20 1602  WBC 10.7*  NEUTROABS 8.3*  HGB 16.5  HCT 49.3  MCV 98.4  PLT [id number]   Basic Metabolic Panel: Recent Labs  Lab 01/22/20 1602  NA 135  K 4.6  CL 99  CO2 25  GLUCOSE 189*  BUN 15  CREATININE 0.69  CALCIUM 9.2   GFR: Estimated Creatinine Clearance: 99.3 mL/min (by C-G formula based on SCr of 0.69 mg/dL).   Liver Function Tests: Recent Labs  Lab 01/22/20 1602  AST 22  ALT 31  ALKPHOS 80  BILITOT 0.7  PROT 7.3  ALBUMIN 3.4*   CBG: Recent Labs  Lab 01/22/20 2244 01/23/20 0747 01/23/20 1136  GLUCAP 363* 284* 333*   Lipid Profile: Recent Labs    01/22/20 1602  TRIG 231*   Anemia Panel: Recent Labs    01/22/20 1602  FERRITIN 764*   Sepsis Labs: Recent Labs  Lab 01/22/20 1546 01/22/20 1602  PROCALCITON  --  <0.10  LATICACIDVEN 1.9  --     Recent Results (from the past 240 hour(s))  Novel Coronavirus, NAA ([facility])     Status: Abnormal   Collection Time: 01/15/20 12:00 AM   Specimen: Nasopharyngeal(NP) swabs in vial transport medium   NASOPHARYNGE  TESTING  Result Value Ref Range Status   SARS-CoV-2, NAA Detected (A) Not Detected Final    Comment: This nucleic acid amplification test was developed and its performance characteristics determined by [employer]. Nucleic acid amplification tests include RT-PCR and TMA. This test has not been FDA cleared or approved. This test has been authorized by FDA under an Emergency Use Authorization (EUA). This test is only authorized for the duration of time the declaration that circumstances exist justifying the authorization of the emergency use of in vitro diagnostic tests for detection of SARS-CoV-2 virus and/or diagnosis of COVID-19 infection  under section 564(b)(1) of the Act, 21 U.S.C. [id number]) (1), unless the authorization is terminated or revoked sooner. When diagnostic testing is negative, the possibility of a false negative result should be considered in the context of a patient's recent exposures and the presence of clinical signs and symptoms consistent with COVID-19. An individual without symptoms of COVID-19 and who is not shedding SARS-CoV-2 virus wo uld expect to have a negative (not detected) result in this assay.   Blood Culture (routine x 2)     Status: None (Preliminary result)   Collection Time: 01/22/20  4:02 PM   Specimen: BLOOD  Result Value Ref Range Status   Specimen Description   Final    BLOOD LEFT ANTECUBITAL Performed at [place] [address] [place], [place] [address]    Special Requests   Final    BOTTLES DRAWN AEROBIC AND ANAEROBIC Blood Culture adequate volume Performed at [facility] [address], [place], [place] [address]    Culture   Final  NO GROWTH < 12 HOURS Performed at [facility] [address], [place], [place] [address]    Report Status PENDING  Incomplete  SARS CORONAVIRUS 2 (TAT 6-24 HRS) Nasopharyngeal Nasopharyngeal Swab     Status: Abnormal   Collection Time: 01/22/20  4:02 PM   Specimen: Nasopharyngeal Swab  Result Value Ref Range Status   SARS Coronavirus 2 POSITIVE (A) NEGATIVE Final    Comment: RESULT CALLED TO, READ BACK BY AND VERIFIED WITH: S. MCDONALD,RN 0248 01/23/2020 T. TYSOR (NOTE) SARS-CoV-2 target nucleic acids are DETECTED. The SARS-CoV-2 RNA is generally detectable in upper and lower respiratory specimens during the acute phase of infection. Positive results are indicative of the presence of SARS-CoV-2 RNA. Clinical correlation with patient history and other diagnostic information is  necessary to determine patient infection status. Positive results do not rule out bacterial infection or  co-infection with other viruses.  The expected result is Negative. Fact Sheet for Patients: SugarRoll.be Fact Sheet for Healthcare Providers: https://www.woods-mathews.com/ This test is not yet approved or cleared by the [place] FDA and  has been authorized for detection and/or diagnosis of SARS-CoV-2 by FDA under an Emergency Use Authorization (EUA). This EUA will remain  in effect (meaning this test can be used) for  the duration of the COVID-19 declaration under Section 564(b)(1) of the Act, 21 U.S.C. section 360bbb-3(b)(1), unless the authorization is terminated or revoked sooner. Performed at [facility], [place] [address], [place], [place] [address]   Blood Culture (routine x 2)     Status: None (Preliminary result)   Collection Time: 01/22/20  4:51 PM   Specimen: BLOOD RIGHT HAND  Result Value Ref Range Status   Specimen Description   Final    BLOOD RIGHT HAND Performed at [place] [address], [place], [place] [address]    Special Requests   Final    BOTTLES DRAWN AEROBIC AND ANAEROBIC Blood Culture adequate volume Performed at [facility] [place] [address] [place], [place] [address]    Culture   Final    NO GROWTH < 12 HOURS Performed at [place] [address] [place], [place] [address]    Report Status PENDING  Incomplete    Radiology Studies: DG Chest Port 1 View  Result Date: 01/22/2020 CLINICAL DATA:  Worsening shortness of breath. COVID diagnosis 01/15/2020. EXAM: PORTABLE CHEST 1 VIEW COMPARISON:  Radiograph 03/26/2017 FINDINGS: Patchy and streaky opacities in both lung bases and right midlung zone. Overall lung volumes are low. Unchanged heart size and mediastinal contours. No pulmonary edema or large pleural effusion. No pneumothorax. No acute osseous abnormalities are seen. IMPRESSION: Heterogeneous bibasilar and right midlung opacities consistent with  multifocal pneumonia, pattern typical of COVID-19. Electronically Signed   By: Keith Rake M.D.   On: 01/22/2020 16:29   Scheduled Meds: . amLODipine  5 mg Oral QHS  . aspirin EC  81 mg Oral QPM  . dexamethasone (DECADRON) injection  6 mg Intravenous Q24H  . enoxaparin (LOVENOX) injection  40 mg Subcutaneous Q24H  . insulin aspart  0-15 Units Subcutaneous TID WC  . insulin aspart  0-5 Units Subcutaneous QHS  . metoprolol succinate  25 mg Oral QHS  . omega-3 acid ethyl esters  1 g Oral Daily  . pantoprazole  40 mg Oral Daily  . rosuvastatin  5 mg Oral Daily   Continuous Infusions: . remdesivir 100 mg in NS 100 mL Stopped (01/23/20 0855)  LOS: 1 day   Time spent: >31min  Little Ishikawa, DO [facility]  If 7PM-7AM, please contact night-coverage [email or web address]  01/23/2020, 3:06 PM

## 2020-01-23 NOTE — Progress Notes (Signed)
Have tried to wean patient's oxygen down today and unable.  Patient continues at 6L HFNC.  When turned down to 5L at rest he will desaturate to 87%.

## 2020-01-24 LAB — COMPREHENSIVE METABOLIC PANEL
ALT: 25 U/L (ref 0–44)
AST: 19 U/L (ref 15–41)
Albumin: 3.1 g/dL — ABNORMAL LOW (ref 3.5–5.0)
Alkaline Phosphatase: 71 U/L (ref 38–126)
Anion gap: 11 (ref 5–15)
BUN: 24 mg/dL — ABNORMAL HIGH (ref 8–23)
CO2: 25 mmol/L (ref 22–32)
Calcium: 9.1 mg/dL (ref 8.9–10.3)
Chloride: 100 mmol/L (ref 98–111)
Creatinine, Ser: 0.85 mg/dL (ref 0.61–1.24)
GFR calc Af Amer: >60 mL/min (ref >60)
GFR calc non Af Amer: >60 mL/min (ref >60)
Glucose, Bld: 288 mg/dL — ABNORMAL HIGH (ref 70–99)
Potassium: 4.8 mmol/L (ref 3.5–5.1)
Sodium: 136 mmol/L (ref 135–145)
Total Bilirubin: 0.8 mg/dL (ref 0.3–1.2)
Total Protein: 6.9 g/dL (ref 6.5–8.1)

## 2020-01-24 LAB — CBC WITH DIFFERENTIAL/PLATELET
Abs Immature Granulocytes: 0.49 10*3/uL — ABNORMAL HIGH (ref 0.00–0.07)
Basophils Absolute: 0.1 10*3/uL (ref 0.0–0.1)
Basophils Relative: 1 %
Eosinophils Absolute: 0 10*3/uL (ref 0.0–0.5)
Eosinophils Relative: 0 %
HCT: 46.7 % (ref 39.0–52.0)
Hemoglobin: 15.4 g/dL (ref 13.0–17.0)
Immature Granulocytes: 3 %
Lymphocytes Relative: 9 %
Lymphs Abs: 1.5 10*3/uL (ref 0.7–4.0)
MCH: 32.4 pg (ref 26.0–34.0)
MCHC: 33 g/dL (ref 30.0–36.0)
MCV: 98.1 fL (ref 80.0–100.0)
Monocytes Absolute: 1.1 10*3/uL — ABNORMAL HIGH (ref 0.1–1.0)
Monocytes Relative: 6 %
Neutro Abs: 13.8 10*3/uL — ABNORMAL HIGH (ref 1.7–7.7)
Neutrophils Relative %: 81 %
Platelets: 358 10*3/uL (ref 150–400)
RBC: 4.76 MIL/uL (ref 4.22–5.81)
RDW: 11.8 % (ref 11.5–15.5)
WBC: 16.9 10*3/uL — ABNORMAL HIGH (ref 4.0–10.5)
nRBC: 0 % (ref 0.0–0.2)

## 2020-01-24 LAB — GLUCOSE, CAPILLARY
Glucose-Capillary: 269 mg/dL — ABNORMAL HIGH (ref 70–99)
Glucose-Capillary: 458 mg/dL — ABNORMAL HIGH (ref 70–99)
Glucose-Capillary: 470 mg/dL — ABNORMAL HIGH (ref 70–99)
Glucose-Capillary: 438 mg/dL — ABNORMAL HIGH (ref 70–99)
Glucose-Capillary: 446 mg/dL — ABNORMAL HIGH (ref 70–99)
Glucose-Capillary: 381 mg/dL — ABNORMAL HIGH (ref 70–99)

## 2020-01-24 LAB — C-REACTIVE PROTEIN: CRP: 3.3 mg/dL — ABNORMAL HIGH (ref <1.0)

## 2020-01-24 LAB — D-DIMER, QUANTITATIVE: D-Dimer, Quant: <0.27 ug/mL-FEU (ref 0.00–0.50)

## 2020-01-24 MED ORDER — INSULIN ASPART 100 UNIT/ML ~~LOC~~ SOLN
0.0000 [IU] | Freq: Three times a day (TID) | SUBCUTANEOUS | Status: DC
  Administered 2020-01-24: 20 [IU] via SUBCUTANEOUS
  Administered 2020-01-25: 4 [IU] via SUBCUTANEOUS
  Administered 2020-01-25 – 2020-01-26 (×4): 20 [IU] via SUBCUTANEOUS
  Administered 2020-01-26: 4 [IU] via SUBCUTANEOUS
  Administered 2020-01-27: 20 [IU] via SUBCUTANEOUS
  Administered 2020-01-27: 4 [IU] via SUBCUTANEOUS

## 2020-01-24 MED ORDER — INSULIN GLARGINE 100 UNIT/ML ~~LOC~~ SOLN
10.0000 [IU] | Freq: Two times a day (BID) | SUBCUTANEOUS | Status: DC
  Administered 2020-01-24: 10 [IU] via SUBCUTANEOUS
  Filled 2020-01-24 (×2): qty 0.1

## 2020-01-24 MED ORDER — INSULIN GLARGINE 100 UNIT/ML ~~LOC~~ SOLN
10.0000 [IU] | Freq: Every day | SUBCUTANEOUS | Status: DC
  Administered 2020-01-24: 10 [IU] via SUBCUTANEOUS
  Filled 2020-01-24: qty 0.1

## 2020-01-24 MED ORDER — INSULIN ASPART 100 UNIT/ML ~~LOC~~ SOLN
20.0000 [IU] | Freq: Once | SUBCUTANEOUS | Status: AC
  Administered 2020-01-24: 20 [IU] via SUBCUTANEOUS

## 2020-01-24 NOTE — Progress Notes (Signed)
PROGRESS NOTE    Devin Huffman  [id number] DOB: 1946/07/01 DOA: 01/22/2020 PCP: Ria Bush, MD   Brief Narrative:  Devin Huffman is a 74 y.o. male with medical history significant for History of hypertension, PVD, GERD, type 2 diabetes with neuropathy, hyperlipidemia, gout, recent Covid infection who presents with worsening shortness of breath. Patient initially started having symptoms on 1/31 and then was diagnosed with Covid on 2/4.  Since then he continues to have a cough and intermittent fever up to 101.  He recently had outpatient Bamlanivimab infusion on 01/18/20 but felt his symptoms had worsened.  He continues to feel fatigue and today he noticed increasing shortness of breath ambulating across his living room and when he tried to lay down.  He called his PCP who initially referred him to the outpatient respiratory clinic but it was closed prompting him to instead call EMS. In the ED, he was febrile up to 100.3 and was mildly hypertensive up to systolic of 123456 and required up to 4 L via nasal cannula to maintain oxygen saturation of 94%. He denies any nausea, vomiting or diarrhea abdominal pain.  Has had a good appetite and feels hungry now.  No chest pain.  Wife at home also has COVID but minimal symptoms.  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 He was started on high-dose Decadron, remdesivir, and 125 cc continuous IV fluids.  Assessment & Plan:   Principal Problem:   Pneumonia due to COVID-19 virus Active Problems:   Dyslipidemia   Essential hypertension   Type 2 diabetes mellitus with other specified complication (HCC)   Obesity, Class I, BMI 30-34.9   PVD (peripheral vascular disease) (HCC)   Respiratory failure with hypoxia (HCC)    Acute hypoxic respiratory failure secondary to COVID pneumonia Moderate to severe disease SpO2: 91 % O2 Flow Rate (L/min): 6 L/min Worsening oxygen  Maintain O2 > 88-92% Recent Labs   01/22/20 1602 01/24/20 0315  DDIMER 0.51* <0.27  FERRITIN 764*  --   LDH 225*  --   CRP 3.4* 3.3*  Continue IV Decadron through 01/30/20 Continue Remdesivir through 01/26/20 Monitor inflammatory markers  Type 2 diabetes with peripheral neuropathy, uncontrolled Last hemoglobin A1c of 7.3 in 06/2019. Start lantus at 10u BID Increase sliding scale to resistant  Hypertension, essential Mildly hypertensive on admit  Continue metoprolol and amlodipine  PVD Continue aspirin  Hyperlipidemia Continue statin  Morbid obesity BMI greater than 32  DVT prophylaxis:.Lovenox Code Status: Full Family Communication: Plan discussed with patient at bedside  Disposition Plan: Home after completion of therapy and improvement in symptoms  Subjective: No acute issues or events overnight, respiratory status continues to be somewhat tenuous, patient ports ongoing dyspnea even at rest as well as marked fatigue.  Denies chest pain, headache, fevers, chills.  Objective: Vitals:   01/23/20 2005 01/23/20 2336 01/24/20 0633 01/24/20 0800  BP: (!) 142/75 (!) 142/88 (!) 144/85 (!) 141/73  Pulse: 87 76 66 60  Resp: 18 20 20 18   Temp: (!) 97.5 F (36.4 C) 98.1 F (36.7 C) 97.9 F (36.6 C) 98.5 F (36.9 C)  TempSrc: Oral Oral Oral Oral  SpO2: 91% 91% 92% 91%  Weight:      Height:        Intake/Output Summary (Last 24 hours) at 01/24/2020 0820 Last data filed at 01/24/2020 0025 Gross per 24 hour  Intake 820 ml  Output 2250 ml  Net -  1430 ml   Filed Weights   01/22/20 1536 01/23/20 0000  Weight: 103 kg 103.7 kg    Examination:  General: Pleasantly resting in bed, No acute distress. HEENT: Normocephalic atraumatic.  Sclerae nonicteric, noninjected.  Extraocular movements intact bilaterally. Neck: Without mass or deformity.  Trachea is midline. Lungs: Coarse breath sounds bilaterally, moderate rhonchi without overt wheeze or rales. Heart: Regular rate and rhythm.  Without murmurs, rubs,  or gallops. Abdomen: Soft, nontender, nondistended.  Without guarding or rebound. Extremities: Without cyanosis, clubbing, edema, or obvious deformity. Vascular: Dorsalis pedis and posterior tibial pulses palpable bilaterally. Skin:  Warm and dry, no erythema, no ulcerations.   Data Reviewed: I have personally reviewed following labs and imaging studies  CBC: Recent Labs  Lab 01/22/20 1602 01/24/20 0315  WBC 10.7* 16.9*  NEUTROABS 8.3* 13.8*  HGB 16.5 15.4  HCT 49.3 46.7  MCV 98.4 98.1  PLT 269 123456   Basic Metabolic Panel: Recent Labs  Lab 01/22/20 1602 01/24/20 0315  NA 135 136  K 4.6 4.8  CL 99 100  CO2 25 25  GLUCOSE 189* 288*  BUN 15 24*  CREATININE 0.69 0.85  CALCIUM 9.2 9.1   GFR: Estimated Creatinine Clearance: 93.5 mL/min (by C-G formula based on SCr of 0.85 mg/dL).   Liver Function Tests: Recent Labs  Lab 01/22/20 1602 01/24/20 0315  AST 22 19  ALT 31 25  ALKPHOS 80 71  BILITOT 0.7 0.8  PROT 7.3 6.9  ALBUMIN 3.4* 3.1*   CBG: Recent Labs  Lab 01/22/20 2244 01/23/20 0747 01/23/20 1136 01/23/20 1640 01/23/20 2003  GLUCAP 363* 284* 333* 382* 360*   Lipid Profile: Recent Labs    01/22/20 1602  TRIG 231*   Anemia Panel: Recent Labs    01/22/20 1602  FERRITIN 764*   Sepsis Labs: Recent Labs  Lab 01/22/20 1546 01/22/20 1602  PROCALCITON  --  <0.10  LATICACIDVEN 1.9  --     Recent Results (from the past 240 hour(s))  Novel Coronavirus, NAA ([facility])     Status: Abnormal   Collection Time: 01/15/20 12:00 AM   Specimen: Nasopharyngeal(NP) swabs in vial transport medium   NASOPHARYNGE  TESTING  Result Value Ref Range Status   SARS-CoV-2, NAA Detected (A) Not Detected Final    Comment: This nucleic acid amplification test was developed and its performance characteristics determined by [employer]. Nucleic acid amplification tests include RT-PCR and TMA. This test has not been FDA cleared or approved. This test has been  authorized by FDA under an Emergency Use Authorization (EUA). This test is only authorized for the duration of time the declaration that circumstances exist justifying the authorization of the emergency use of in vitro diagnostic tests for detection of SARS-CoV-2 virus and/or diagnosis of COVID-19 infection under section 564(b)(1) of the Act, 21 U.S.C. [id number]) (1), unless the authorization is terminated or revoked sooner. When diagnostic testing is negative, the possibility of a false negative result should be considered in the context of a patient's recent exposures and the presence of clinical signs and symptoms consistent with COVID-19. An individual without symptoms of COVID-19 and who is not shedding SARS-CoV-2 virus wo uld expect to have a negative (not detected) result in this assay.   Blood Culture (routine x 2)     Status: None (Preliminary result)   Collection Time: 01/22/20  4:02 PM   Specimen: BLOOD  Result Value Ref Range Status   Specimen Description   Final    BLOOD LEFT  ANTECUBITAL Performed at [facility], [place] [address], [place], [place] [address]    Special Requests   Final    BOTTLES DRAWN AEROBIC AND ANAEROBIC Blood Culture adequate volume Performed at [facility] [address] [place], [place] [address]    Culture   Final    NO GROWTH < 12 HOURS Performed at [facility] [address] [place], [place] [address]    Report Status PENDING  Incomplete  SARS CORONAVIRUS 2 (TAT 6-24 HRS) Nasopharyngeal Nasopharyngeal Swab     Status: Abnormal   Collection Time: 01/22/20  4:02 PM   Specimen: Nasopharyngeal Swab  Result Value Ref Range Status   SARS Coronavirus 2 POSITIVE (A) NEGATIVE Final    Comment: RESULT CALLED TO, READ BACK BY AND VERIFIED WITH: S. MCDONALD,RN 0248 01/23/2020 T. TYSOR (NOTE) SARS-CoV-2 target nucleic acids are DETECTED. The SARS-CoV-2 RNA is generally detectable in upper and  lower respiratory specimens during the acute phase of infection. Positive results are indicative of the presence of SARS-CoV-2 RNA. Clinical correlation with patient history and other diagnostic information is  necessary to determine patient infection status. Positive results do not rule out bacterial infection or co-infection with other viruses.  The expected result is Negative. Fact Sheet for Patients: SugarRoll.be Fact Sheet for Healthcare Providers: https://www.woods-mathews.com/ This test is not yet approved or cleared by the [place] FDA and  has been authorized for detection and/or diagnosis of SARS-CoV-2 by FDA under an Emergency Use Authorization (EUA). This EUA will remain  in effect (meaning this test can be used) for  the duration of the COVID-19 declaration under Section 564(b)(1) of the Act, 21 U.S.C. section 360bbb-3(b)(1), unless the authorization is terminated or revoked sooner. Performed at [facility], [place] [address], [place], [place] [address]   Blood Culture (routine x 2)     Status: None (Preliminary result)   Collection Time: 01/22/20  4:51 PM   Specimen: BLOOD RIGHT HAND  Result Value Ref Range Status   Specimen Description   Final    BLOOD RIGHT HAND Performed at [facility] [address] [place], [place] [address]    Special Requests   Final    BOTTLES DRAWN AEROBIC AND ANAEROBIC Blood Culture adequate volume Performed at [facility] [address], [place], [place] [address]    Culture   Final    NO GROWTH < 12 HOURS Performed at [facility] [address] [place],  [address]    Report Status PENDING  Incomplete    Radiology Studies: DG Chest Port 1 View  Result Date: 01/22/2020 CLINICAL DATA:  Worsening shortness of breath. COVID diagnosis 01/15/2020. EXAM: PORTABLE CHEST 1 VIEW COMPARISON:  Radiograph 03/26/2017 FINDINGS: Patchy and  streaky opacities in both lung bases and right midlung zone. Overall lung volumes are low. Unchanged heart size and mediastinal contours. No pulmonary edema or large pleural effusion. No pneumothorax. No acute osseous abnormalities are seen. IMPRESSION: Heterogeneous bibasilar and right midlung opacities consistent with multifocal pneumonia, pattern typical of COVID-19. Electronically Signed   By: Keith Rake M.D.   On: 01/22/2020 16:29   Scheduled Meds: . amLODipine  5 mg Oral QHS  . aspirin EC  81 mg Oral QPM  . dexamethasone (DECADRON) injection  6 mg Intravenous Q24H  . enoxaparin (LOVENOX) injection  40 mg Subcutaneous Q24H  . insulin aspart  0-15 Units Subcutaneous TID WC  . insulin aspart  0-5 Units Subcutaneous QHS  . metoprolol succinate  25 mg Oral QHS  . omega-3 acid ethyl esters  1 g Oral Daily  . pantoprazole  40 mg Oral Daily  . rosuvastatin  5 mg Oral Daily   Continuous Infusions: . remdesivir 100 mg in NS 100 mL Stopped (01/23/20 0855)    LOS: 2 days   Time spent: >4min  Little Ishikawa, DO [facility]  If 7PM-7AM, please contact night-coverage [email or web address]  01/24/2020, 8:20 AM

## 2020-01-25 LAB — D-DIMER, QUANTITATIVE: D-Dimer, Quant: <0.27 ug/mL-FEU (ref 0.00–0.50)

## 2020-01-25 LAB — GLUCOSE, CAPILLARY
Glucose-Capillary: 195 mg/dL — ABNORMAL HIGH (ref 70–99)
Glucose-Capillary: 358 mg/dL — ABNORMAL HIGH (ref 70–99)
Glucose-Capillary: 383 mg/dL — ABNORMAL HIGH (ref 70–99)
Glucose-Capillary: 430 mg/dL — ABNORMAL HIGH (ref 70–99)

## 2020-01-25 LAB — CBC WITH DIFFERENTIAL/PLATELET
Abs Immature Granulocytes: 0.63 10*3/uL — ABNORMAL HIGH (ref 0.00–0.07)
Basophils Absolute: 0.1 10*3/uL (ref 0.0–0.1)
Basophils Relative: 1 %
Eosinophils Absolute: 0 10*3/uL (ref 0.0–0.5)
Eosinophils Relative: 0 %
HCT: 44.5 % (ref 39.0–52.0)
Hemoglobin: 15.2 g/dL (ref 13.0–17.0)
Immature Granulocytes: 4 %
Lymphocytes Relative: 7 %
Lymphs Abs: 1.3 10*3/uL (ref 0.7–4.0)
MCH: 32.8 pg (ref 26.0–34.0)
MCHC: 34.2 g/dL (ref 30.0–36.0)
MCV: 95.9 fL (ref 80.0–100.0)
Monocytes Absolute: 1.1 10*3/uL — ABNORMAL HIGH (ref 0.1–1.0)
Monocytes Relative: 6 %
Neutro Abs: 14.7 10*3/uL — ABNORMAL HIGH (ref 1.7–7.7)
Neutrophils Relative %: 82 %
Platelets: 400 10*3/uL (ref 150–400)
RBC: 4.64 MIL/uL (ref 4.22–5.81)
RDW: 11.9 % (ref 11.5–15.5)
WBC: 17.9 10*3/uL — ABNORMAL HIGH (ref 4.0–10.5)
nRBC: 0 % (ref 0.0–0.2)

## 2020-01-25 LAB — COMPREHENSIVE METABOLIC PANEL
ALT: 23 U/L (ref 0–44)
AST: 15 U/L (ref 15–41)
Albumin: 3 g/dL — ABNORMAL LOW (ref 3.5–5.0)
Alkaline Phosphatase: 67 U/L (ref 38–126)
Anion gap: 9 (ref 5–15)
BUN: 30 mg/dL — ABNORMAL HIGH (ref 8–23)
CO2: 23 mmol/L (ref 22–32)
Calcium: 9.1 mg/dL (ref 8.9–10.3)
Chloride: 103 mmol/L (ref 98–111)
Creatinine, Ser: 0.8 mg/dL (ref 0.61–1.24)
GFR calc Af Amer: >60 mL/min (ref >60)
GFR calc non Af Amer: >60 mL/min (ref >60)
Glucose, Bld: 253 mg/dL — ABNORMAL HIGH (ref 70–99)
Potassium: 4.6 mmol/L (ref 3.5–5.1)
Sodium: 135 mmol/L (ref 135–145)
Total Bilirubin: 0.7 mg/dL (ref 0.3–1.2)
Total Protein: 6.6 g/dL (ref 6.5–8.1)

## 2020-01-25 LAB — HEMOGLOBIN A1C
Hgb A1c MFr Bld: 9 % — ABNORMAL HIGH (ref 4.8–5.6)
Mean Plasma Glucose: 211.6 mg/dL

## 2020-01-25 LAB — C-REACTIVE PROTEIN: CRP: 1.4 mg/dL — ABNORMAL HIGH (ref <1.0)

## 2020-01-25 MED ORDER — INSULIN GLARGINE 100 UNIT/ML ~~LOC~~ SOLN
20.0000 [IU] | Freq: Two times a day (BID) | SUBCUTANEOUS | Status: DC
  Administered 2020-01-25 (×2): 20 [IU] via SUBCUTANEOUS
  Filled 2020-01-25 (×3): qty 0.2

## 2020-01-25 MED ORDER — INSULIN ASPART 100 UNIT/ML ~~LOC~~ SOLN
8.0000 [IU] | Freq: Once | SUBCUTANEOUS | Status: AC
  Administered 2020-01-25: 8 [IU] via SUBCUTANEOUS

## 2020-01-25 NOTE — Plan of Care (Signed)
  Problem: Education: Goal: Knowledge of risk factors and measures for prevention of condition will improve Outcome: Progressing   Problem: Coping: Goal: Psychosocial and spiritual needs will be supported Outcome: Progressing   Problem: Respiratory: Goal: Will maintain a patent airway Outcome: Progressing Goal: Complications related to the disease process, condition or treatment will be avoided or minimized Outcome: Progressing   

## 2020-01-25 NOTE — Plan of Care (Signed)
New orders for blood sugar of 430 received. No Lab draw ordered per doctor Opyd.

## 2020-01-25 NOTE — Progress Notes (Signed)
PROGRESS NOTE    JERAMINE LOUKS  [id number] DOB: 01-Dec-1946 DOA: 01/22/2020 PCP: Ria Bush, MD   Brief Narrative:  Devin Huffman is a 74 y.o. male with medical history significant for History of hypertension, PVD, GERD, type 2 diabetes with neuropathy, hyperlipidemia, gout, recent Covid infection who presents with worsening shortness of breath. Patient initially started having symptoms on 1/31 and then was diagnosed with Covid on 2/4.  Since then he continues to have a cough and intermittent fever up to 101.  He recently had outpatient Bamlanivimab infusion on 01/18/20 but felt his symptoms had worsened.  He continues to feel fatigue and today he noticed increasing shortness of breath ambulating across his living room and when he tried to lay down.  He called his PCP who initially referred him to the outpatient respiratory clinic but it was closed prompting him to instead call EMS. In the ED, he was febrile up to 100.3 and was mildly hypertensive up to systolic of 123456 and required up to 4 L via nasal cannula to maintain oxygen saturation of 94%. He denies any nausea, vomiting or diarrhea abdominal pain.  Has had a good appetite and feels hungry now.  No chest pain.  Wife at home also has COVID but minimal symptoms.  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 He was started on high-dose Decadron, remdesivir, and 125 cc continuous IV fluids.  Subjective: No acute issues or events overnight, respiratory status slowly improving per patient. He reports ongoing dyspnea even at rest as well as marked fatigue.  Denies chest pain, headache, fevers, chills.  Assessment & Plan:   Principal Problem:   Pneumonia due to COVID-19 virus Active Problems:   Dyslipidemia   Essential hypertension   Type 2 diabetes mellitus with other specified complication (HCC)   Obesity, Class I, BMI 30-34.9   PVD (peripheral vascular disease) (HCC)   Respiratory failure  with hypoxia (HCC)    Acute hypoxic respiratory failure secondary to COVID pneumonia Moderate to severe disease SpO2: 93 % O2 Flow Rate (L/min): 5 L/min Worsening oxygen  Maintain O2 > 88-92% Recent Labs    01/22/20 1602 01/24/20 0315 01/25/20 0210  DDIMER 0.51* <0.27 <0.27  FERRITIN 764*  --   --   LDH 225*  --   --   CRP 3.4* 3.3* 1.4*  Continue IV Decadron through 01/30/20 Continue Remdesivir through 01/26/20 Monitor inflammatory markers  Type 2 diabetes with peripheral neuropathy, uncontrolled Last hemoglobin A1c of 7.3 in 06/2019. Increase lantus to 20u BID Increase sliding scale to resistant  Hypertension, essential Mildly hypertensive on admit  Continue metoprolol and amlodipine  PVD Continue aspirin  Hyperlipidemia Continue statin  Morbid obesity BMI greater than 32  DVT prophylaxis:.Lovenox Code Status: Full Family Communication: Plan discussed with patient at bedside  Disposition Plan: Home after completion of therapy and improvement in symptoms/hypoxia   Objective: Vitals:   01/25/20 0419 01/25/20 0730 01/25/20 0735 01/25/20 1200  BP: (!) 150/78  (!) 143/72 124/64  Pulse: (!) 59 61 (!) 58   Resp: 18  20 20   Temp: 97.9 F (36.6 C)  97.7 F (36.5 C) 97.8 F (36.6 C)  TempSrc: Oral  Oral Oral  SpO2: 94% 93% 93%   Weight:      Height:        Intake/Output Summary (Last 24 hours) at 01/25/2020 1550 Last data filed at 01/25/2020 1000 Gross per 24 hour  Intake --  Output 1975 ml  Net -1975 ml   Filed Weights   01/22/20 1536 01/23/20 0000  Weight: 103 kg 103.7 kg    Examination:  General: Pleasantly resting in bed, No acute distress. HEENT: Normocephalic atraumatic.  Sclerae nonicteric, noninjected.  Extraocular movements intact bilaterally. Neck: Without mass or deformity.  Trachea is midline. Lungs: Coarse breath sounds bilaterally, moderate rhonchi without overt wheeze or rales. Heart: Regular rate and rhythm.  Without murmurs,  rubs, or gallops. Abdomen: Soft, nontender, nondistended.  Without guarding or rebound. Extremities: Without cyanosis, clubbing, edema, or obvious deformity. Vascular: Dorsalis pedis and posterior tibial pulses palpable bilaterally. Skin:  Warm and dry, no erythema, no ulcerations.   Data Reviewed: I have personally reviewed following labs and imaging studies  CBC: Recent Labs  Lab 01/22/20 1602 01/24/20 0315 01/25/20 0210  WBC 10.7* 16.9* 17.9*  NEUTROABS 8.3* 13.8* 14.7*  HGB 16.5 15.4 15.2  HCT 49.3 46.7 44.5  MCV 98.4 98.1 95.9  PLT 269 358 [id number]   Basic Metabolic Panel: Recent Labs  Lab 01/22/20 1602 01/24/20 0315 01/25/20 0210  NA 135 136 135  K 4.6 4.8 4.6  CL 99 100 103  CO2 25 25 23   GLUCOSE 189* 288* 253*  BUN 15 24* 30*  CREATININE 0.69 0.85 0.80  CALCIUM 9.2 9.1 9.1   GFR: Estimated Creatinine Clearance: 99.3 mL/min (by C-G formula based on SCr of 0.8 mg/dL).   Liver Function Tests: Recent Labs  Lab 01/22/20 1602 01/24/20 0315 01/25/20 0210  AST 22 19 15   ALT 31 25 23   ALKPHOS 80 71 67  BILITOT 0.7 0.8 0.7  PROT 7.3 6.9 6.6  ALBUMIN 3.4* 3.1* 3.0*   CBG: Recent Labs  Lab 01/24/20 1645 01/24/20 1842 01/24/20 2001 01/25/20 0722 01/25/20 1207  GLUCAP 438* 446* 381* 195* 358*   Lipid Profile: Recent Labs    01/22/20 1602  TRIG 231*   Anemia Panel: Recent Labs    01/22/20 1602  FERRITIN 764*   Sepsis Labs: Recent Labs  Lab 01/22/20 1546 01/22/20 1602  PROCALCITON  --  <0.10  LATICACIDVEN 1.9  --     Recent Results (from the past 240 hour(s))  Blood Culture (routine x 2)     Status: None (Preliminary result)   Collection Time: 01/22/20  4:02 PM   Specimen: BLOOD  Result Value Ref Range Status   Specimen Description   Final    BLOOD LEFT ANTECUBITAL Performed at [facility], [place] [address], [place], [place] [address]    Special Requests   Final    BOTTLES DRAWN AEROBIC AND ANAEROBIC Blood Culture  adequate volume Performed at [facility] [address] [place], [place] [address]    Culture   Final    NO GROWTH 3 DAYS Performed at [facility], [place] [address], [place], [place] [address]    Report Status PENDING  Incomplete  SARS CORONAVIRUS 2 (TAT 6-24 HRS) Nasopharyngeal Nasopharyngeal Swab     Status: Abnormal   Collection Time: 01/22/20  4:02 PM   Specimen: Nasopharyngeal Swab  Result Value Ref Range Status   SARS Coronavirus 2 POSITIVE (A) NEGATIVE Final    Comment: RESULT CALLED TO, READ BACK BY AND VERIFIED WITH: S. MCDONALD,RN 0248 01/23/2020 T. TYSOR (NOTE) SARS-CoV-2 target nucleic acids are DETECTED. The SARS-CoV-2 RNA is generally detectable in upper and lower respiratory specimens during the acute phase of infection. Positive results are indicative of the presence of SARS-CoV-2 RNA. Clinical  correlation with patient history and other diagnostic information is  necessary to determine patient infection status. Positive results do not rule out bacterial infection or co-infection with other viruses.  The expected result is Negative. Fact Sheet for Patients: SugarRoll.be Fact Sheet for Healthcare Providers: https://www.woods-mathews.com/ This test is not yet approved or cleared by the [place] FDA and  has been authorized for detection and/or diagnosis of SARS-CoV-2 by FDA under an Emergency Use Authorization (EUA). This EUA will remain  in effect (meaning this test can be used) for  the duration of the COVID-19 declaration under Section 564(b)(1) of the Act, 21 U.S.C. section 360bbb-3(b)(1), unless the authorization is terminated or revoked sooner. Performed at [facility], [place] [address] [place], [place] [address]   Blood Culture (routine x 2)     Status: None (Preliminary result)   Collection Time: 01/22/20  4:51 PM   Specimen: BLOOD RIGHT HAND  Result Value Ref Range Status    Specimen Description   Final    BLOOD RIGHT HAND Performed at [facility] [address] [place], [place] [address]    Special Requests   Final    BOTTLES DRAWN AEROBIC AND ANAEROBIC Blood Culture adequate volume Performed at [facility] [address], [place], [place] [address]    Culture   Final    NO GROWTH 3 DAYS Performed at [facility], [place] [address] [place], [place] [address]    Report Status PENDING  Incomplete    Radiology Studies: No results found. Scheduled Meds: . amLODipine  5 mg Oral QHS  . aspirin EC  81 mg Oral QPM  . dexamethasone (DECADRON) injection  6 mg Intravenous Q24H  . enoxaparin (LOVENOX) injection  40 mg Subcutaneous Q24H  . insulin aspart  0-20 Units Subcutaneous TID WC  . insulin aspart  0-5 Units Subcutaneous QHS  . insulin glargine  20 Units Subcutaneous BID  . metoprolol succinate  25 mg Oral QHS  . omega-3 acid ethyl esters  1 g Oral Daily  . pantoprazole  40 mg Oral Daily  . rosuvastatin  5 mg Oral Daily   Continuous Infusions: . remdesivir 100 mg in NS 100 mL 100 mg (01/25/20 1012)    LOS: 3 days   Time spent: >2min  Little Ishikawa, DO [facility]  If 7PM-7AM, please contact night-coverage [email or web address]  01/25/2020, 3:50 PM

## 2020-01-25 NOTE — Progress Notes (Signed)
Called and updated wife about patient condition and plans for discharge in one to two days. Wife stated relieved of patient's progress and improvement. Patient is currently on RA with sats in 88-91%. Will continue to monitor.

## 2020-01-26 LAB — COMPREHENSIVE METABOLIC PANEL
ALT: 32 U/L (ref 0–44)
AST: 20 U/L (ref 15–41)
Albumin: 3.3 g/dL — ABNORMAL LOW (ref 3.5–5.0)
Alkaline Phosphatase: 75 U/L (ref 38–126)
Anion gap: 12 (ref 5–15)
BUN: 39 mg/dL — ABNORMAL HIGH (ref 8–23)
CO2: 23 mmol/L (ref 22–32)
Calcium: 9.3 mg/dL (ref 8.9–10.3)
Chloride: 106 mmol/L (ref 98–111)
Creatinine, Ser: 0.93 mg/dL (ref 0.61–1.24)
GFR calc Af Amer: >60 mL/min (ref >60)
GFR calc non Af Amer: >60 mL/min (ref >60)
Glucose, Bld: 259 mg/dL — ABNORMAL HIGH (ref 70–99)
Potassium: 4.4 mmol/L (ref 3.5–5.1)
Sodium: 141 mmol/L (ref 135–145)
Total Bilirubin: 0.6 mg/dL (ref 0.3–1.2)
Total Protein: 6.8 g/dL (ref 6.5–8.1)

## 2020-01-26 LAB — GLUCOSE, CAPILLARY
Glucose-Capillary: 184 mg/dL — ABNORMAL HIGH (ref 70–99)
Glucose-Capillary: 322 mg/dL — ABNORMAL HIGH (ref 70–99)
Glucose-Capillary: 410 mg/dL — ABNORMAL HIGH (ref 70–99)
Glucose-Capillary: 428 mg/dL — ABNORMAL HIGH (ref 70–99)
Glucose-Capillary: 434 mg/dL — ABNORMAL HIGH (ref 70–99)
Glucose-Capillary: 437 mg/dL — ABNORMAL HIGH (ref 70–99)

## 2020-01-26 LAB — CBC WITH DIFFERENTIAL/PLATELET
Abs Immature Granulocytes: 0.96 10*3/uL — ABNORMAL HIGH (ref 0.00–0.07)
Basophils Absolute: 0 10*3/uL (ref 0.0–0.1)
Basophils Relative: 0 %
Eosinophils Absolute: 0 10*3/uL (ref 0.0–0.5)
Eosinophils Relative: 0 %
HCT: 47.8 % (ref 39.0–52.0)
Hemoglobin: 16.4 g/dL (ref 13.0–17.0)
Immature Granulocytes: 6 %
Lymphocytes Relative: 9 %
Lymphs Abs: 1.5 10*3/uL (ref 0.7–4.0)
MCH: 32.7 pg (ref 26.0–34.0)
MCHC: 34.3 g/dL (ref 30.0–36.0)
MCV: 95.2 fL (ref 80.0–100.0)
Monocytes Absolute: 1.3 10*3/uL — ABNORMAL HIGH (ref 0.1–1.0)
Monocytes Relative: 8 %
Neutro Abs: 13.2 10*3/uL — ABNORMAL HIGH (ref 1.7–7.7)
Neutrophils Relative %: 77 %
Platelets: 444 10*3/uL — ABNORMAL HIGH (ref 150–400)
RBC: 5.02 MIL/uL (ref 4.22–5.81)
RDW: 11.8 % (ref 11.5–15.5)
WBC: 17 10*3/uL — ABNORMAL HIGH (ref 4.0–10.5)
nRBC: 0 % (ref 0.0–0.2)

## 2020-01-26 LAB — C-REACTIVE PROTEIN: CRP: 1.1 mg/dL — ABNORMAL HIGH (ref <1.0)

## 2020-01-26 LAB — D-DIMER, QUANTITATIVE: D-Dimer, Quant: <0.27 ug/mL-FEU (ref 0.00–0.50)

## 2020-01-26 MED ORDER — LIVING WELL WITH DIABETES BOOK
Freq: Once | Status: AC
  Administered 2020-01-26: 1
  Filled 2020-01-26: qty 1

## 2020-01-26 MED ORDER — INSULIN ASPART 100 UNIT/ML ~~LOC~~ SOLN
8.0000 [IU] | Freq: Once | SUBCUTANEOUS | Status: AC
  Administered 2020-01-26: 8 [IU] via SUBCUTANEOUS

## 2020-01-26 MED ORDER — INSULIN STARTER KIT- PEN NEEDLES (ENGLISH)
1.0000 | Freq: Once | Status: AC
  Administered 2020-01-26: 1
  Filled 2020-01-26: qty 1

## 2020-01-26 MED ORDER — INSULIN ASPART 100 UNIT/ML ~~LOC~~ SOLN
4.0000 [IU] | Freq: Once | SUBCUTANEOUS | Status: AC
  Administered 2020-01-26: 4 [IU] via SUBCUTANEOUS

## 2020-01-26 MED ORDER — LINAGLIPTIN 5 MG PO TABS
5.0000 mg | ORAL_TABLET | Freq: Every day | ORAL | Status: DC
  Administered 2020-01-26 – 2020-01-27 (×2): 5 mg via ORAL
  Filled 2020-01-26: qty 1

## 2020-01-26 MED ORDER — INSULIN GLARGINE 100 UNIT/ML ~~LOC~~ SOLN
30.0000 [IU] | Freq: Two times a day (BID) | SUBCUTANEOUS | Status: DC
  Administered 2020-01-26 – 2020-01-27 (×3): 30 [IU] via SUBCUTANEOUS
  Filled 2020-01-26 (×3): qty 0.3

## 2020-01-26 NOTE — Progress Notes (Signed)
BG 424; rechecked BG 434. MD Paged. MD verbal order to give max sliding scale insulin dose of 20 units. He also made this RN aware that pharmacy is going to change insulin order to increase insulin coverage. Will reassess BG in 1 hour.

## 2020-01-26 NOTE — Progress Notes (Signed)
Occupational Therapy Evaluation Patient Details Name: Devin Huffman MRN: [id number] DOB: 1946/09/02 Today's Date: 01/26/2020    History of Present Illness Devin Huffman is a 74 y.o. male with medical history significant for History of hypertension, PVD, GERD, type 2 diabetes with neuropathy, hyperlipidemia, gout, recent Covid infection who presents with worsening shortness of breath. Patient initially started having symptoms on 1/31 and then was diagnosed with Covid on 2/4.  Since then he continues to have a cough and intermittent fever up to 101.  He recently had outpatient Bamlanivimab infusion on 01/18/20 but felt his symptoms had worsened.  He continues to feel fatigue and today he noticed increasing shortness of breath ambulating across his living room and when he tried to lay down.  He called his PCP who initially referred him to the outpatient respiratory clinic but it was closed prompting him to instead call EMS. In the ED, he was febrile up to 100.3 and was mildly hypertensive up to systolic of 123456 and required up to 4 L via nasal cannula to maintain oxygen saturation of 94%. He denies any nausea, vomiting or diarrhea abdominal pain.  Has had a good appetite and feels hungry now.  No chest pain.  Wife at home also has COVID but minimal symptoms.  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 He was started on high-dose Decadron, remdesivir, and 125 cc continuous IV fluids.   Clinical Impression   PTA pt resided with wife, independent in all ADL, IADL, and mobility tasks. Pt does not ambulate with an assistive device and reports 0 falls in the last 6 months. Pt still drives and works part time at a funeral home. Pt currently independent to supervision for self-care and mobility tasks. Pt able to ambulate to/from bathroom, complete toileting task, and stand ~1 min at the sink to wash hands. Pt tolerated standing an additional 1 x 7 min while ambulating around  room and engaging in standing therapeutic exercise. Pt reported min to mod shortness of breath throughout. SpO2 dropped to 80% on room air with pt requiring ~1 min seated rest break to return to 91%. Educated pt on safety strategies and energy conservation techniques with handout provided. All questions/concerns answered at this time. RN updated. Pt demonstrates decreased strength, endurance, balance, standing tolerance, and activity tolerance impacting ability to complete self-care and functional transfer tasks. Recommend skilled OT services to address above deficits in order to promote function and prevent further decline.     Follow Up Recommendations  No OT follow up;Supervision - Intermittent    Equipment Recommendations  None recommended by OT    Recommendations for Other Services       Precautions / Restrictions Precautions Precautions: Other (comment) Precaution Comments: Monitor SpO2 Restrictions Weight Bearing Restrictions: No      Mobility Bed Mobility               General bed mobility comments: Pt seated in bedside chair upon OT arrival.   Transfers Overall transfer level: Needs assistance Equipment used: None Transfers: Sit to/from Stand;Stand Pivot Transfers Sit to Stand: Supervision Stand pivot transfers: Supervision       General transfer comment: Supervision to ensure safety with lines    Balance Overall balance assessment: Mild deficits observed, not formally tested  ADL either performed or assessed with clinical judgement   ADL Overall ADL's : Needs assistance/impaired Eating/Feeding: Independent;Sitting   Grooming: Supervision/safety;Standing;Set up   Upper Body Bathing: Supervision/ safety;Standing;Set up   Lower Body Bathing: Supervison/ safety;Sit to/from stand;Set up   Upper Body Dressing : Sitting;Modified independent   Lower Body Dressing: Set up;Supervision/safety;Sit to/from  stand   Toilet Transfer: Sales executive;Ambulation   Toileting- Clothing Manipulation and Hygiene: Supervision/safety;Sit to/from stand       Functional mobility during ADLs: Supervision/safety General ADL Comments: Pt able to ambulate to/from bathroom without an assistive device. Noted 0 instances of LOB.      Vision Baseline Vision/History: Wears glasses Wears Glasses: Reading only       Perception     Praxis      Pertinent Vitals/Pain Pain Assessment: No/denies pain     Hand Dominance Right   Extremity/Trunk Assessment Upper Extremity Assessment Upper Extremity Assessment: Overall WFL for tasks assessed   Lower Extremity Assessment Lower Extremity Assessment: Defer to PT evaluation       Communication Communication Communication: No difficulties   Cognition Arousal/Alertness: Awake/alert Behavior During Therapy: WFL for tasks assessed/performed Overall Cognitive Status: Within Functional Limits for tasks assessed                                     General Comments  Pt on room air. SpO2 dropped to 80% following activity with pt requiring ~1 min seated rest break to return to 91%. Educated and provided pt with energy conservation handout and standing ther ex handout.     Exercises Exercises: Other exercises Other Exercises Other Exercises: Encouraged pursed lip breathing throughout   Shoulder Instructions      Home Living Family/patient expects to be discharged to:: Private residence Living Arrangements: Spouse/significant other Available Help at Discharge: Family Type of Home: House Home Access: Stairs to enter Technical brewer of Steps: 6   Home Layout: One level     Bathroom Shower/Tub: Tub/shower unit;Walk-in Psychologist, prison and probation services: Standard     Home Equipment: Clinical cytogeneticist - 2 wheels          Prior Functioning/Environment Level of Independence: Independent        Comments: Pt  independent with ADLs, IADLs, and mobility. Pt does not ambulate with an assistive device and reports 0 falls in the last 6 months. Pt still drives and works part time at a funeral home.         OT Problem List: Decreased strength;Cardiopulmonary status limiting activity;Decreased activity tolerance;Impaired balance (sitting and/or standing)      OT Treatment/Interventions: Self-care/ADL training;Therapeutic exercise;Neuromuscular education;Energy conservation;DME and/or AE instruction;Therapeutic activities;Patient/family education;Balance training    OT Goals(Current goals can be found in the care plan section) Acute Rehab OT Goals Patient Stated Goal: to go home Time For Goal Achievement: 02/09/20 Potential to Achieve Goals: Good ADL Goals Additional ADL Goal #1: Pt to complete all ADLs independently with SpO2 maintaining above 90%. Additional ADL Goal #2: Pt to tolerate standing up to 10 min independently, in preparation for ADLs. Additional ADL Goal #3: Pt to recall and verbalize 3 energy conservation strategies with 0 verbal cues.  OT Frequency: Min 3X/week   Barriers to D/C:            Co-evaluation              AM-PAC OT "6 Clicks" Daily Activity  Outcome Measure Help from another person eating meals?: None Help from another person taking care of personal grooming?: A Little Help from another person toileting, which includes using toliet, bedpan, or urinal?: A Little Help from another person bathing (including washing, rinsing, drying)?: A Little Help from another person to put on and taking off regular upper body clothing?: None Help from another person to put on and taking off regular lower body clothing?: A Little 6 Click Score: 20   End of Session Nurse Communication: Mobility status  Activity Tolerance: Patient limited by fatigue;Other (comment)(Limited by SOB) Patient left: in chair;with call bell/phone within reach  OT Visit Diagnosis: Unsteadiness on  feet (R26.81);Muscle weakness (generalized) (M62.81)                Time: [id number] OT Time Calculation (min): 34 min Charges:  OT General Charges $OT Visit: 1 Visit OT Evaluation $OT Eval Low Complexity: 1 Low OT Treatments $Therapeutic Activity: 8-22 mins  Mauri Brooklyn OTR/L [phone]  Mauri Brooklyn 01/26/2020, 3:22 PM

## 2020-01-26 NOTE — Plan of Care (Signed)
Diabetic plan of care initiated, booklets provided, Diet discussed with patient and patient needs reinforcement.

## 2020-01-26 NOTE — Progress Notes (Signed)
Inpatient Diabetes Program Recommendations  AACE/ADA: New Consensus Statement on Inpatient Glycemic Control (2015)  Target Ranges:  Prepandial:   less than 140 mg/dL      Peak postprandial:   less than 180 mg/dL (1-2 hours)      Critically ill patients:  140 - 180 mg/dL   Lab Results  Component Value Date   GLUCAP 184 (H) 01/26/2020   HGBA1C 9.0 (H) 01/25/2020    Review of Glycemic Control  Diabetes history: DM2 Outpatient Diabetes medications: None Current orders for Inpatient glycemic control: Lantus 30 units bid, Novolog 0-20 units tidwc and 0-5 units QHS.  On Decadron 6 mg QD HgbA1C - 9%  Inpatient Diabetes Program Recommendations:     Add Novolog 6 units tidwc for meal coverage insulin if pt eats > 50% meal  Add tradjenta 5 mg QD  Ordered Living Well with Diabetes book and insulin pen starter kit. RN to begin teaching insulin pen administration. Will also allow pt to give own injections and stick finger for CBGs.  Will call and speak with pt this afternoon about HgbA1C of 9%, monitoring, and going home on insulin.  Will follow.  Thank you. Lorenda Peck, RD, LDN, CDE Inpatient Diabetes Coordinator [phone]

## 2020-01-26 NOTE — Progress Notes (Signed)
Reviewed Diabetes education with patient, reviewed the causes of too much sugar in  You system and the damage it causes.  Showed patient pictures of vital organs,.   Did some teach and demonstration of using the needleess pen for insulin injection.  Patient demonstrated back, however, patient requires additional teaching.   Patient is apprehensive about Diabetes and having to self treat.  Patient use to eating whatever he wants with no consequences.   Also the MD discussed with patient about diet changes and made i.   This author added diet change suggestions.

## 2020-01-26 NOTE — Progress Notes (Signed)
PROGRESS NOTE    Devin Huffman  [id number] DOB: May 13, 1946 DOA: 01/22/2020 PCP: Ria Bush, MD   Brief Narrative:  Devin Huffman is a 74 y.o. male with medical history significant for History of hypertension, PVD, GERD, type 2 diabetes with neuropathy, hyperlipidemia, gout, recent Covid infection who presents with worsening shortness of breath. Patient initially started having symptoms on 1/31 and then was diagnosed with Covid on 2/4.  Since then he continues to have a cough and intermittent fever up to 101.  He recently had outpatient Bamlanivimab infusion on 01/18/20 but felt his symptoms had worsened.  He continues to feel fatigue and today he noticed increasing shortness of breath ambulating across his living room and when he tried to lay down.  He called his PCP who initially referred him to the outpatient respiratory clinic but it was closed prompting him to instead call EMS. In the ED, he was febrile up to 100.3 and was mildly hypertensive up to systolic of 123456 and required up to 4 L via nasal cannula to maintain oxygen saturation of 94%. He denies any nausea, vomiting or diarrhea abdominal pain.  Has had a good appetite and feels hungry now.  No chest pain.  Wife at home also has COVID but minimal symptoms.  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 He was started on high-dose Decadron, remdesivir, and 125 cc continuous IV fluids.  Subjective: No acute issues or events overnight, respiratory status slowly improving per patient. He reports ongoing dyspnea even at rest as well as marked fatigue.  Denies chest pain, headache, fevers, chills.  Assessment & Plan:   Principal Problem:   Pneumonia due to COVID-19 virus Active Problems:   Dyslipidemia   Essential hypertension   Type 2 diabetes mellitus with other specified complication (HCC)   Obesity, Class I, BMI 30-34.9   PVD (peripheral vascular disease) (HCC)   Respiratory failure  with hypoxia (HCC)   Acute hypoxic respiratory failure secondary to COVID pneumonia Moderate to severe disease SpO2: (!) 89 % O2 Flow Rate (L/min): 5 L/min Maintain O2 > 88-92% Recent Labs    01/24/20 0315 01/25/20 0210 01/26/20 0121  DDIMER <0.27 <0.27 <0.27  CRP 3.3* 1.4* 1.1*  Continue IV Decadron through 01/30/20 Completed Remdesivir 01/26/20 Monitor inflammatory markers  Type 2 diabetes with peripheral neuropathy, uncontrolled Last hemoglobin A1c of 7.3 in 06/2019. Increase lantus to 30u BID Continue sliding scale to resistant Add Tradjenta 5mg  QD DM coordinator following  Hypertension, essential Mildly hypertensive on admit  Continue metoprolol and amlodipine  PVD Continue aspirin  Hyperlipidemia Continue statin  Morbid obesity BMI greater than 32  DVT prophylaxis:.Lovenox Code Status: Full Family Communication: Plan discussed with patient at bedside  Disposition Plan: Home after completion of therapy and improvement in symptoms/hypoxia   Objective: Vitals:   01/25/20 2000 01/26/20 0500 01/26/20 0735 01/26/20 1200  BP: (!) 149/78 133/74 119/77 135/79  Pulse:  78 (!) 59 78  Resp: 18 20 16 16   Temp: 98.4 F (36.9 C) 97.7 F (36.5 C) 97.9 F (36.6 C) 98.4 F (36.9 C)  TempSrc: Oral Oral Oral Oral  SpO2: 92% 92% 91% (!) 89%  Weight:      Height:        Intake/Output Summary (Last 24 hours) at 01/26/2020 1450 Last data filed at 01/26/2020 1400 Gross per 24 hour  Intake 680 ml  Output 2325 ml  Net -1645 ml   Filed  Weights   01/22/20 1536 01/23/20 0000  Weight: 103 kg 103.7 kg    Examination:  General: Pleasantly resting in bed, No acute distress. HEENT: Normocephalic atraumatic.  Sclerae nonicteric, noninjected.  Extraocular movements intact bilaterally. Neck: Without mass or deformity.  Trachea is midline. Lungs: Coarse breath sounds bilaterally, moderate rhonchi without overt wheeze or rales. Heart: Regular rate and rhythm.  Without  murmurs, rubs, or gallops. Abdomen: Soft, nontender, nondistended.  Without guarding or rebound. Extremities: Without cyanosis, clubbing, edema, or obvious deformity. Vascular: Dorsalis pedis and posterior tibial pulses palpable bilaterally. Skin:  Warm and dry, no erythema, no ulcerations.   Data Reviewed: I have personally reviewed following labs and imaging studies  CBC: Recent Labs  Lab 01/22/20 1602 01/24/20 0315 01/25/20 0210 01/26/20 0121  WBC 10.7* 16.9* 17.9* 17.0*  NEUTROABS 8.3* 13.8* 14.7* 13.2*  HGB 16.5 15.4 15.2 16.4  HCT 49.3 46.7 44.5 47.8  MCV 98.4 98.1 95.9 95.2  PLT 269 358 400 [id number]*   Basic Metabolic Panel: Recent Labs  Lab 01/22/20 1602 01/24/20 0315 01/25/20 0210 01/26/20 0121  NA 135 136 135 141  K 4.6 4.8 4.6 4.4  CL 99 100 103 106  CO2 25 25 23 23   GLUCOSE 189* 288* 253* 259*  BUN 15 24* 30* 39*  CREATININE 0.69 0.85 0.80 0.93  CALCIUM 9.2 9.1 9.1 9.3   GFR: Estimated Creatinine Clearance: 85.4 mL/min (by C-G formula based on SCr of 0.93 mg/dL).   Liver Function Tests: Recent Labs  Lab 01/22/20 1602 01/24/20 0315 01/25/20 0210 01/26/20 0121  AST 22 19 15 20   ALT 31 25 23  32  ALKPHOS 80 71 67 75  BILITOT 0.7 0.8 0.7 0.6  PROT 7.3 6.9 6.6 6.8  ALBUMIN 3.4* 3.1* 3.0* 3.3*   CBG: Recent Labs  Lab 01/25/20 2056 01/26/20 0045 01/26/20 0753 01/26/20 1210 01/26/20 1223  GLUCAP 430* 322* 184* 437* 434*   Lipid Profile: No results for input(s): CHOL, HDL, LDLCALC, TRIG, CHOLHDL, LDLDIRECT in the last 72 hours. Anemia Panel: No results for input(s): VITAMINB12, FOLATE, FERRITIN, TIBC, IRON, RETICCTPCT in the last 72 hours. Sepsis Labs: Recent Labs  Lab 01/22/20 1546 01/22/20 1602  PROCALCITON  --  <0.10  LATICACIDVEN 1.9  --     Recent Results (from the past 240 hour(s))  Blood Culture (routine x 2)     Status: None (Preliminary result)   Collection Time: 01/22/20  4:02 PM   Specimen: BLOOD  Result Value Ref Range Status    Specimen Description   Final    BLOOD LEFT ANTECUBITAL Performed at [facility] [address] [place], [place] [address]    Special Requests   Final    BOTTLES DRAWN AEROBIC AND ANAEROBIC Blood Culture adequate volume Performed at [facility] [address] [place], [place] [address]    Culture   Final    NO GROWTH 4 DAYS Performed at [facility], [place] [address] [place], [place] [address]    Report Status PENDING  Incomplete  SARS CORONAVIRUS 2 (TAT 6-24 HRS) Nasopharyngeal Nasopharyngeal Swab     Status: Abnormal   Collection Time: 01/22/20  4:02 PM   Specimen: Nasopharyngeal Swab  Result Value Ref Range Status   SARS Coronavirus 2 POSITIVE (A) NEGATIVE Final    Comment: RESULT CALLED TO, READ BACK BY AND VERIFIED WITH: S. MCDONALD,RN 0248 01/23/2020 T. TYSOR (NOTE) SARS-CoV-2 target nucleic acids are DETECTED. The SARS-CoV-2 RNA is generally detectable in  upper and lower respiratory specimens during the acute phase of infection. Positive results are indicative of the presence of SARS-CoV-2 RNA. Clinical correlation with patient history and other diagnostic information is  necessary to determine patient infection status. Positive results do not rule out bacterial infection or co-infection with other viruses.  The expected result is Negative. Fact Sheet for Patients: SugarRoll.be Fact Sheet for Healthcare Providers: https://www.woods-mathews.com/ This test is not yet approved or cleared by the [place] FDA and  has been authorized for detection and/or diagnosis of SARS-CoV-2 by FDA under an Emergency Use Authorization (EUA). This EUA will remain  in effect (meaning this test can be used) for  the duration of the COVID-19 declaration under Section 564(b)(1) of the Act, 21 U.S.C. section 360bbb-3(b)(1), unless the authorization is terminated or revoked sooner. Performed at [facility], [place] [address] [place], [place] [address]   Blood Culture (routine x 2)     Status: None (Preliminary result)   Collection Time: 01/22/20  4:51 PM   Specimen: BLOOD RIGHT HAND  Result Value Ref Range Status   Specimen Description   Final    BLOOD RIGHT HAND Performed at [facility] [address] [place], [place] [address]    Special Requests   Final    BOTTLES DRAWN AEROBIC AND ANAEROBIC Blood Culture adequate volume Performed at [facility] [address], [place], [place] [address]    Culture   Final    NO GROWTH 4 DAYS Performed at [facility], [place] [address], [place], [place] [address]    Report Status PENDING  Incomplete    Radiology Studies: No results found. Scheduled Meds: . amLODipine  5 mg Oral QHS  . aspirin EC  81 mg Oral QPM  . dexamethasone (DECADRON) injection  6 mg Intravenous Q24H  . enoxaparin (LOVENOX) injection  40 mg Subcutaneous Q24H  . insulin aspart  0-20 Units Subcutaneous TID WC  . insulin aspart  0-5 Units Subcutaneous QHS  . insulin glargine  30 Units Subcutaneous BID  . linagliptin  5 mg Oral Daily  . metoprolol succinate  25 mg Oral QHS  . omega-3 acid ethyl esters  1 g Oral Daily  . pantoprazole  40 mg Oral Daily  . rosuvastatin  5 mg Oral Daily   Continuous Infusions:   LOS: 4 days   Time spent: >37min  Little Ishikawa, DO [facility]  If 7PM-7AM, please contact night-coverage [email or web address]  01/26/2020, 2:50 PM

## 2020-01-27 LAB — COMPREHENSIVE METABOLIC PANEL
ALT: 28 U/L (ref 0–44)
AST: 13 U/L — ABNORMAL LOW (ref 15–41)
Albumin: 3.2 g/dL — ABNORMAL LOW (ref 3.5–5.0)
Alkaline Phosphatase: 66 U/L (ref 38–126)
Anion gap: 12 (ref 5–15)
BUN: 38 mg/dL — ABNORMAL HIGH (ref 8–23)
CO2: 23 mmol/L (ref 22–32)
Calcium: 9.2 mg/dL (ref 8.9–10.3)
Chloride: 104 mmol/L (ref 98–111)
Creatinine, Ser: 0.86 mg/dL (ref 0.61–1.24)
GFR calc Af Amer: >60 mL/min (ref >60)
GFR calc non Af Amer: >60 mL/min (ref >60)
Glucose, Bld: 201 mg/dL — ABNORMAL HIGH (ref 70–99)
Potassium: 4.3 mmol/L (ref 3.5–5.1)
Sodium: 139 mmol/L (ref 135–145)
Total Bilirubin: 0.6 mg/dL (ref 0.3–1.2)
Total Protein: 6.5 g/dL (ref 6.5–8.1)

## 2020-01-27 LAB — CBC WITH DIFFERENTIAL/PLATELET
Abs Immature Granulocytes: 1.26 10*3/uL — ABNORMAL HIGH (ref 0.00–0.07)
Basophils Absolute: 0 10*3/uL (ref 0.0–0.1)
Basophils Relative: 0 %
Eosinophils Absolute: 0 10*3/uL (ref 0.0–0.5)
Eosinophils Relative: 0 %
HCT: 47.2 % (ref 39.0–52.0)
Hemoglobin: 16.2 g/dL (ref 13.0–17.0)
Immature Granulocytes: 8 %
Lymphocytes Relative: 12 %
Lymphs Abs: 1.9 10*3/uL (ref 0.7–4.0)
MCH: 32.4 pg (ref 26.0–34.0)
MCHC: 34.3 g/dL (ref 30.0–36.0)
MCV: 94.4 fL (ref 80.0–100.0)
Monocytes Absolute: 1.4 10*3/uL — ABNORMAL HIGH (ref 0.1–1.0)
Monocytes Relative: 8 %
Neutro Abs: 12 10*3/uL — ABNORMAL HIGH (ref 1.7–7.7)
Neutrophils Relative %: 72 %
Platelets: 458 10*3/uL — ABNORMAL HIGH (ref 150–400)
RBC: 5 MIL/uL (ref 4.22–5.81)
RDW: 11.8 % (ref 11.5–15.5)
WBC: 16.6 10*3/uL — ABNORMAL HIGH (ref 4.0–10.5)
nRBC: 0 % (ref 0.0–0.2)

## 2020-01-27 LAB — GLUCOSE, CAPILLARY
Glucose-Capillary: 192 mg/dL — ABNORMAL HIGH (ref 70–99)
Glucose-Capillary: 366 mg/dL — ABNORMAL HIGH (ref 70–99)

## 2020-01-27 LAB — CULTURE, BLOOD (ROUTINE X 2)
Culture: NO GROWTH
Culture: NO GROWTH
Special Requests: ADEQUATE
Special Requests: ADEQUATE

## 2020-01-27 LAB — D-DIMER, QUANTITATIVE: D-Dimer, Quant: 0.29 ug/mL-FEU (ref 0.00–0.50)

## 2020-01-27 LAB — C-REACTIVE PROTEIN: CRP: 0.7 mg/dL (ref <1.0)

## 2020-01-27 MED ORDER — INSULIN ASPART PROT & ASPART (70-30 MIX) 100 UNIT/ML ~~LOC~~ SUSP: 50.0000 [IU] | Freq: Two times a day (BID) | SUBCUTANEOUS | 11 refills | Status: DC

## 2020-01-27 MED ORDER — INSULIN ASPART PROT & ASPART (70-30 MIX) 100 UNIT/ML ~~LOC~~ SUSP
50.0000 [IU] | Freq: Two times a day (BID) | SUBCUTANEOUS | Status: DC
  Filled 2020-01-27: qty 10

## 2020-01-27 MED ORDER — BLOOD GLUCOSE MONITOR KIT: PACK | 0 refills | Status: DC

## 2020-01-27 MED ORDER — METFORMIN HCL 500 MG PO TABS: 500.0000 mg | ORAL_TABLET | Freq: Two times a day (BID) | ORAL | 0 refills | Status: DC

## 2020-01-27 MED ORDER — "PEN NEEDLES 3/16"" 31G X 5 MM MISC": 1.0000 | Freq: Two times a day (BID) | 0 refills | Status: DC

## 2020-01-27 MED ORDER — DEXAMETHASONE 6 MG PO TABS: 6.0000 mg | ORAL_TABLET | Freq: Every day | ORAL | 0 refills | Status: DC

## 2020-01-27 MED ORDER — METFORMIN HCL 500 MG PO TABS
500.0000 mg | ORAL_TABLET | Freq: Two times a day (BID) | ORAL | Status: DC
  Administered 2020-01-27: 500 mg via ORAL
  Filled 2020-01-27: qty 1

## 2020-01-27 MED ORDER — LINAGLIPTIN 5 MG PO TABS: 5.0000 mg | ORAL_TABLET | Freq: Every day | ORAL | 0 refills | Status: DC

## 2020-01-27 MED ORDER — NOVOLIN 70/30 FLEXPEN RELION (70-30) 100 UNIT/ML ~~LOC~~ SUPN: 50.0000 [IU] | PEN_INJECTOR | Freq: Two times a day (BID) | SUBCUTANEOUS | 11 refills | Status: DC

## 2020-01-27 NOTE — Progress Notes (Signed)
SATURATION QUALIFICATIONS: (This note is used to comply with regulatory documentation for home oxygen)  Patient Saturations on Room Air at Rest = 92%  Patient Saturations on Room Air while Ambulating = min 86%  Patient Saturations on 2 Liters of oxygen while Ambulating = min 91%  Please briefly explain why patient needs home oxygen: Pt desaturates on room air with mobility and requires supplemental oxygen to maintain a safe saturation level.   Horald Chestnut, PT

## 2020-01-27 NOTE — Progress Notes (Signed)
Inpatient Diabetes Program Recommendations  AACE/ADA: New Consensus Statement on Inpatient Glycemic Control (2015)  Target Ranges:  Prepandial:   less than 140 mg/dL      Peak postprandial:   less than 180 mg/dL (1-2 hours)      Critically ill patients:  140 - 180 mg/dL   Lab Results  Component Value Date   GLUCAP 192 (H) 01/27/2020   HGBA1C 9.0 (H) 01/25/2020    Review of Glycemic Control  Spoke with pt this morning about going home on insulin. We discussed importance of controlling blood sugars to prevent complications. Discussed basic home care, impact of nutrition, exercise, stress, sickness, and medications on diabetes control. Reviewed concepts of Living Well with diabetes booklet and encouraged patient to read through entire book. Informed patient that he will be prescribed Novolin 70/30 since it is more affordable. Discussed taking insuiln before breakfast and dinner meal. Also discussed importance of monitoring blood sugars 2-3x/day, with Libre or glucose monitor. Reviewed hypoglycemia s/s and treatment. Instructed pt to f/u with PCP within a week - 10 days and take blood sugar log to appt. Discussed HgbA1C of 9% which equates to average daily blood sugar of 212 mg/dL. Ideally, HgbA1C should be around 7%. Answered questions regarding diet and portion control. Pt appreciative of information.  Inpatient Diabetes Program Recommendations:     For Home:  Novolin ReliOn 70/30 insulin pen #[id number] Insulin pen needles [phone] Blood glucose meter kit or Elenor Legato  Thank you. Lorenda Peck, RD, LDN, CDE Inpatient Diabetes Coordinator [phone]

## 2020-01-27 NOTE — Discharge Summary (Addendum)
Physician Discharge Summary  Devin Huffman XBM:[id number] DOB: 1946/09/16 DOA: 01/22/2020  PCP: Ria Bush, MD  Admit date: 01/22/2020 Discharge date: 01/27/2020  Admitted From: Home  Disposition: Home  Recommendations for Outpatient Follow-up:  1. Follow up with PCP in 1-2 weeks 2. Please obtain BMP/CBC in one week  Home Health: Yes Equipment/Devices: Oxygen  Discharge Condition: Stable CODE STATUS: Full Diet recommendation: Diabetic diet  Brief/Interim Summary: Devin Huffman a 74 y.o.malewith medical history significant forHistory of hypertension, PVD, GERD, type 2 diabetes with neuropathy, hyperlipidemia, gout, recent Covid infection who presents with worsening shortness of breath. Patient initially started having symptoms on 1/31 and then was diagnosed with Covid on 2/4. Since then he continues to have a cough andintermittent fever up to 101. He recently had outpatient Bamlanivimab infusion on 01/18/20 butfelt his symptoms had worsened. He continues to feel fatigue and today he noticed increasing shortness of breath ambulating across his living room and when he tried to lay down. He called his PCP who initially referred him to the outpatient respiratory clinic but it was closed prompting him to instead call EMS. In the ED,he was febrile up to 100.3 and was mildly hypertensive up to systolic of 027O and required up to 4 L via nasal cannula to maintain oxygen saturation of 94%. He denies any nausea, vomiting or diarrhea abdominal pain. Has had a good appetite and feels hungry now. No chest pain. Wife at home also has COVIDbut minimal symptoms. 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 He was started on high-dose Decadron,remdesivir.  Patient admitted as above with acute hypoxic respiratory failure in setting of COVID-19 pneumonia, patient resolved quite quickly after receiving remdesivir and Decadron.  Fortunately while on  Decadron patient's glucose remained markedly uncontrolled, A1c returned with elevation at 9 consistent with uncontrolled diabetes type 2.  Patient had all A1c minimally elevated but was educated on diabetic diet and lifestyle changes which he did not comply with.  At this time given patient's marked elevation in glucose diabetic coordinator was asked to follow and has recommended 70/30 insulin at home twice daily, will continue on Metformin and linagliptin.  Patient will need very close follow-up with home health agency nursing and PCP to ensure patient does not have rebound hypoglycemia in the setting of dietary changes and steroid taper as well as will need to ensure compliance with insulin and care glucose checks to ensure no overt hyperglycemia as well.  Stands the risks of not being compliant with medications but is otherwise medically stable for discharge given resolution of hypoxia from COVID-19 pneumonia requiring only 2 L nasal cannula with exertion to maintain sats but on room air at rest maintain sats on his own.  Per diabetic coordinator was educated on taking insuiln before breakfast and dinner meal.  As well as frequent glucose checks 2-3 times per day with glucose monitor. Reviewed hypoglycemia s/s and treatment. Instructed pt to f/u with PCP within a week - 10 days and take blood sugar log to appt. Discussed HgbA1C of 9% which equates to average daily blood sugar of 212 mg/dL.  This time patient stable and otherwise agreeable for discharge home.  Wife to pick up this afternoon.  Discharge Diagnoses:  Principal Problem:   Pneumonia due to COVID-19 virus Active Problems:   Dyslipidemia   Essential hypertension   Type 2 diabetes mellitus with other specified complication (HCC)   Obesity, Class I, BMI 30-34.9  PVD (peripheral vascular disease) ([place])   Respiratory failure with hypoxia [facility])    Discharge Instructions  Discharge Instructions    Call MD for:  difficulty breathing, headache  or visual disturbances   Complete by: As directed    Call MD for:  extreme fatigue   Complete by: As directed    Call MD for:  hives   Complete by: As directed    Call MD for:  persistant dizziness or light-headedness   Complete by: As directed    Call MD for:  persistant nausea and vomiting   Complete by: As directed    Call MD for:  severe uncontrolled pain   Complete by: As directed    Call MD for:  temperature >100.4   Complete by: As directed    Diet - low sodium heart healthy   Complete by: As directed    Increase activity slowly   Complete by: As directed      Allergies as of 01/27/2020      Reactions   Lisinopril Hives, Swelling   ?angioedema - lip swelling, hives      Medication List    TAKE these medications   amLODipine 5 MG tablet Commonly known as: NORVASC Take 1 tablet (5 mg total) by mouth at bedtime.   aspirin EC 81 MG tablet Take 81 mg by mouth every evening.   B COMPLEX-VITAMIN B12 PO Take 1 tablet by mouth every evening.   blood glucose meter kit and supplies Kit Dispense based on patient and insurance preference. Use up to four times daily as directed. (FOR ICD-9 250.00, 250.01).   Coenzyme Q10 50 MG Caps Take 1 capsule (50 mg total) by mouth daily. What changed: when to take this   dexamethasone 6 MG tablet Commonly known as: DECADRON Take 1 tablet (6 mg total) by mouth daily.   Fish Oil 1000 MG Caps Take 1 capsule (1,000 mg total) by mouth 2 (two) times a day. What changed: when to take this   indomethacin 50 MG capsule Commonly known as: INDOCIN TAKE 1 CAPSULE BY MOUTH THREE TIMES DAILY AS NEEDED What changed: See the new instructions.   linagliptin 5 MG Tabs tablet Commonly known as: TRADJENTA Take 1 tablet (5 mg total) by mouth daily. Start taking on: January 28, 2020   metFORMIN 500 MG tablet Commonly known as: GLUCOPHAGE Take 1 tablet (500 mg total) by mouth 2 (two) times daily with a meal.   metoprolol succinate 25 MG 24  hr tablet Commonly known as: TOPROL-XL Take 1 tablet (25 mg total) by mouth at bedtime.   NovoLIN 70/30 FlexPen Relion (70-30) 100 UNIT/ML PEN Generic drug: Insulin Isophane & Regular Human Inject 50 Units into the skin 2 (two) times daily.   omeprazole 20 MG capsule Commonly known as: PRILOSEC TAKE ONE CAPSULE BY MOUTH ONCE DAILY AS NEEDED What changed: See the new instructions.   Pen Needles 3/16" 31G X 5 MM Misc 1 Container by Does not apply route 2 (two) times daily.   rosuvastatin 5 MG tablet Commonly known as: CRESTOR Take 1 tablet (5 mg total) by mouth daily.   vitamin C 500 MG tablet Commonly known as: ASCORBIC ACID Take 500 mg by mouth every evening.   Vitamin D3 20 MCG (800 UNIT) Tabs Take 25 mcg by mouth every evening.            Durable Medical Equipment  (From admission, onward)         Start  Ordered   01/27/20 1146  DME Oxygen  Once    Question Answer Comment  Length of Need 6 Months   Mode or (Route) Nasal cannula   Liters per Minute 2   Frequency Continuous (stationary and portable oxygen unit needed)   Oxygen conserving device No   Oxygen delivery system Gas      01/27/20 [place] Follow up.   Why: agency will provide home oxygen Contact information: [place] [address] [phone]        [facility] Follow up.   Specialty: Home Health Services Why: agency will provide home health nurse. agency will call you to schedule first visit.  Contact information: [place] [address] [phone]          Allergies  Allergen Reactions  . Lisinopril Hives and Swelling    ?angioedema - lip swelling, hives    Procedures/Studies: DG Chest Port 1 View  Result Date: 01/22/2020 CLINICAL DATA:  Worsening shortness of breath. COVID diagnosis 01/15/2020. EXAM: PORTABLE CHEST 1 VIEW COMPARISON:  Radiograph 03/26/2017 FINDINGS:  Patchy and streaky opacities in both lung bases and right midlung zone. Overall lung volumes are low. Unchanged heart size and mediastinal contours. No pulmonary edema or large pleural effusion. No pneumothorax. No acute osseous abnormalities are seen. IMPRESSION: Heterogeneous bibasilar and right midlung opacities consistent with multifocal pneumonia, pattern typical of COVID-19. Electronically Signed   By: Keith Rake M.D.   On: 01/22/2020 16:29    Subjective: No acute issues or events overnight, glucose more well controlled today, ambulating without hypoxia or dyspnea, requesting discharge home which is certainly reasonable given his resolution of symptoms and improving hypoxia and glucose.   Discharge Exam: Vitals:   01/27/20 0730 01/27/20 0805  BP:  132/80  Pulse: 65 65  Resp:    Temp:  99 F (37.2 C)  SpO2: 92% 91%   Vitals:   01/27/20 0720 01/27/20 0725 01/27/20 0730 01/27/20 0805  BP:    132/80  Pulse: 66 64 65 65  Resp:      Temp:    99 F (37.2 C)  TempSrc:    Tympanic  SpO2: 92% 91% 92% 91%  Weight:      Height:        General:  Pleasantly resting in bed, No acute distress. HEENT:  Normocephalic atraumatic.  Sclerae nonicteric, noninjected.  Extraocular movements intact bilaterally. Neck:  Without mass or deformity.  Trachea is midline. Lungs:  Clear to auscultate bilaterally without rhonchi, wheeze, or rales. Heart:  Regular rate and rhythm.  Without murmurs, rubs, or gallops. Abdomen:  Soft, nontender, nondistended.  Without guarding or rebound. Extremities: Without cyanosis, clubbing, edema, or obvious deformity. Vascular:  Dorsalis pedis and posterior tibial pulses palpable bilaterally. Skin:  Warm and dry, no erythema, no ulcerations.   The results of significant diagnostics from this hospitalization (including imaging, microbiology, ancillary and laboratory) are listed below for reference.     Microbiology: Recent Results (from the past 240 hour(s))   Blood Culture (routine x 2)     Status: None   Collection Time: 01/22/20  4:02 PM   Specimen: BLOOD  Result Value Ref Range Status   Specimen Description   Final    BLOOD LEFT ANTECUBITAL Performed at [facility] [address], [place], [place] [address]    Special Requests   Final  BOTTLES DRAWN AEROBIC AND ANAEROBIC Blood Culture adequate volume Performed at [facility] [address] [place], [place] [address]    Culture   Final    NO GROWTH 5 DAYS Performed at [facility], [place] [address], [place], [place] [address]    Report Status 01/27/2020 FINAL  Final  SARS CORONAVIRUS 2 (TAT 6-24 HRS) Nasopharyngeal Nasopharyngeal Swab     Status: Abnormal   Collection Time: 01/22/20  4:02 PM   Specimen: Nasopharyngeal Swab  Result Value Ref Range Status   SARS Coronavirus 2 POSITIVE (A) NEGATIVE Final    Comment: RESULT CALLED TO, READ BACK BY AND VERIFIED WITH: S. MCDONALD,RN 0248 01/23/2020 T. TYSOR (NOTE) SARS-CoV-2 target nucleic acids are DETECTED. The SARS-CoV-2 RNA is generally detectable in upper and lower respiratory specimens during the acute phase of infection. Positive results are indicative of the presence of SARS-CoV-2 RNA. Clinical correlation with patient history and other diagnostic information is  necessary to determine patient infection status. Positive results do not rule out bacterial infection or co-infection with other viruses.  The expected result is Negative. Fact Sheet for Patients: SugarRoll.be Fact Sheet for Healthcare Providers: https://www.woods-mathews.com/ This test is not yet approved or cleared by the [place] FDA and  has been authorized for detection and/or diagnosis of SARS-CoV-2 by FDA under an Emergency Use Authorization (EUA). This EUA will remain  in effect (meaning this test can be used) for  the duration of the COVID-19 declaration under  Section 564(b)(1) of the Act, 21 U.S.C. section 360bbb-3(b)(1), unless the authorization is terminated or revoked sooner. Performed at [facility], [place] [address], [place], [place] [address]   Blood Culture (routine x 2)     Status: None   Collection Time: 01/22/20  4:51 PM   Specimen: BLOOD RIGHT HAND  Result Value Ref Range Status   Specimen Description   Final    BLOOD RIGHT HAND Performed at [facility] [address] [place], [place] [address]    Special Requests   Final    BOTTLES DRAWN AEROBIC AND ANAEROBIC Blood Culture adequate volume Performed at [facility] [address] [place] [address] [place], [place] [address]    Culture   Final    NO GROWTH 5 DAYS Performed at [facility], [place] [address] [place],  [address]    Report Status 01/27/2020 FINAL  Final     Labs: BNP (last 3 results) No results for input(s): BNP in the last 8760 hours. Basic Metabolic Panel: Recent Labs  Lab 01/22/20 1602 01/24/20 0315 01/25/20 0210 01/26/20 0121 01/27/20 0355  NA 135 136 135 141 139  K 4.6 4.8 4.6 4.4 4.3  CL 99 100 103 106 104  CO2 _0 GLUCOSE 189* 288* 253* 259* 201*  BUN 15 24* 30* 39* 38*  CREATININE 0.69 0.85 0.80 0.93 0.86  CALCIUM 9.2 9.1 9.1 9.3 9.2   Liver Function Tests: Recent Labs  Lab 01/22/20 1602 01/24/20 0315 01/25/20 0210 01/26/20 0121 01/27/20 0355  AST _1 13*  ALT _2 32 28  ALKPHOS 80 71 67 75 66  BILITOT 0.7 0.8 0.7 0.6 0.6  PROT 7.3 6.9 6.6 6.8 6.5  ALBUMIN 3.4* 3.1* 3.0* 3.3* 3.2*   No results for input(s): LIPASE, AMYLASE in the last 168 hours. No results for input(s): AMMONIA in the last 168 hours. CBC: Recent Labs  Lab 01/22/20 1602 01/24/20  2376 01/25/20 0210 01/26/20 0121 01/27/20 0355  WBC 10.7* 16.9* 17.9* 17.0* 16.6*  NEUTROABS 8.3* 13.8* 14.7* 13.2* 12.0*  HGB 16.5 15.4 15.2 16.4 16.2  HCT 49.3 46.7 44.5 47.8 47.2  MCV 98.4 98.1 95.9 95.2 94.4   PLT 269 358 400 444* 458*   Cardiac Enzymes: No results for input(s): CKTOTAL, CKMB, CKMBINDEX, TROPONINI in the last 168 hours. BNP: Invalid input(s): POCBNP CBG: Recent Labs  Lab 01/26/20 1223 01/26/20 1711 01/26/20 2031 01/27/20 0749 01/27/20 1130  GLUCAP 434* 428* 410* 192* 366*   D-Dimer Recent Labs    01/26/20 0121 01/27/20 0355  DDIMER <0.27 0.29   Hgb A1c Recent Labs    01/25/20 0837  HGBA1C 9.0*   Lipid Profile No results for input(s): CHOL, HDL, LDLCALC, TRIG, CHOLHDL, LDLDIRECT in the last 72 hours. Thyroid function studies No results for input(s): TSH, T4TOTAL, T3FREE, THYROIDAB in the last 72 hours.  Invalid input(s): FREET3 Anemia work up No results for input(s): VITAMINB12, FOLATE, FERRITIN, TIBC, IRON, RETICCTPCT in the last 72 hours. Urinalysis    Component Value Date/Time   COLORURINE YELLOW 03/26/2017 1458   APPEARANCEUR HAZY (A) 03/26/2017 1458   LABSPEC 1.025 03/26/2017 1458   PHURINE 6.0 03/26/2017 1458   GLUCOSEU NEGATIVE 03/26/2017 1458   HGBUR MODERATE (A) 03/26/2017 1458   BILIRUBINUR negative 11/29/2018 0821   KETONESUR NEGATIVE 03/26/2017 1458   PROTEINUR Negative 11/29/2018 0821   PROTEINUR 30 (A) 03/26/2017 1458   UROBILINOGEN 0.2 11/29/2018 0821   UROBILINOGEN 0.2 04/15/2013 0838   NITRITE negative 11/29/2018 0821   NITRITE NEGATIVE 03/26/2017 1458   LEUKOCYTESUR Small (1+) (A) 11/29/2018 0821   Sepsis Labs Invalid input(s): PROCALCITONIN,  WBC,  LACTICIDVEN Microbiology Recent Results (from the past 240 hour(s))  Blood Culture (routine x 2)     Status: None   Collection Time: 01/22/20  4:02 PM   Specimen: BLOOD  Result Value Ref Range Status   Specimen Description   Final    BLOOD LEFT ANTECUBITAL Performed at [facility] [address] [place], [place] [address]    Special Requests   Final    BOTTLES DRAWN AEROBIC AND ANAEROBIC Blood Culture adequate volume Performed at [facility] [address] [place], [place] [address]    Culture   Final    NO GROWTH 5 DAYS Performed at [facility], [place] [address] [place], [place] [address]    Report Status 01/27/2020 FINAL  Final  SARS CORONAVIRUS 2 (TAT 6-24 HRS) Nasopharyngeal Nasopharyngeal Swab     Status: Abnormal   Collection Time: 01/22/20  4:02 PM   Specimen: Nasopharyngeal Swab  Result Value Ref Range Status   SARS Coronavirus 2 POSITIVE (A) NEGATIVE Final    Comment: RESULT CALLED TO, READ BACK BY AND VERIFIED WITH: S. MCDONALD,RN 0248 01/23/2020 T. TYSOR (NOTE) SARS-CoV-2 target nucleic acids are DETECTED. The SARS-CoV-2 RNA is generally detectable in upper and lower respiratory specimens during the acute phase of infection. Positive results are indicative of the presence of SARS-CoV-2 RNA. Clinical correlation with patient history and other diagnostic information is  necessary to determine patient infection status. Positive results do not rule out bacterial infection or co-infection with other viruses.  The expected result is Negative. Fact Sheet for Patients: SugarRoll.be Fact Sheet for Healthcare Providers: https://www.woods-mathews.com/ This test is not yet approved or cleared by the [place] FDA and  has been authorized for detection and/or diagnosis of SARS-CoV-2 by FDA under an  Emergency Use Authorization (EUA). This EUA will remain  in effect (meaning this test can be used) for  the duration of the COVID-19 declaration under Section 564(b)(1) of the Act, 21 U.S.C. section 360bbb-3(b)(1), unless the authorization is terminated or revoked sooner. Performed at [facility], [place] [address] [place], [place] [address]   Blood Culture (routine x 2)     Status: None   Collection Time: 01/22/20  4:51 PM   Specimen: BLOOD RIGHT HAND  Result Value Ref Range Status   Specimen Description   Final    BLOOD RIGHT HAND Performed at [facility] [address], [place], [place] [address]    Special Requests   Final    BOTTLES DRAWN AEROBIC AND ANAEROBIC Blood Culture adequate volume Performed at [facility] [address], [place], [place] [address]    Culture   Final    NO GROWTH 5 DAYS Performed at [facility], [place] [address], [place], [place] [address]    Report Status 01/27/2020 FINAL  Final     Time coordinating discharge: Over 30 minutes  SIGNED:   Little Ishikawa, DO [facility] 01/27/2020, 12:35 PM Pager   If 7PM-7AM, please contact night-coverage [email or web address]

## 2020-01-27 NOTE — Evaluation (Signed)
Physical Therapy Evaluation Patient Details Name: Devin Huffman MRN: [id number] DOB: 26-Dec-1945 Today's Date: 01/27/2020   History of Present Illness  74 y.o. male with PMHX of HTN, PVD, GERD, type 2 diabetes with neuropathy, hyperlipidemia, gout, recent Covid infection who presents with worsening shortness of breath. Patient initially started having symptoms on 1/31 and then was diagnosed with Covid on 2/4.  Since then he continues to have a cough and intermittent fever up to 101.  He recently had outpatient Bamlanivimab infusion on 01/18/20 but felt his symptoms had worsened.  He continues to feel fatigue and noticed increasing shortness of breath ambulating across his living room and when he tried to lay down.  He called his PCP who initially referred him to the outpatient respiratory clinic but it was closed prompting him to instead call EMS. In the ED, he was febrile up to 100.3 and was mildly hypertensive up to systolic of [id number] and required up to 4 L via nasal cannula to maintain oxygen saturation of 94%. He denies any nausea, vomiting or diarrhea abdominal pain.  No chest pain.  Wife at home also has COVID but minimal symptoms.  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 He was started on high-dose Decadron, remdesivir, and 125 cc continuous IV fluids.  Clinical Impression   Pt admitted with above hx and above dx. Pt found sitting in recliner reports has been ambulating in room, has gone to rest room all on his own. Pt was on room air at start of session and sats in 90s. PTA pt was living home with spouse and states was very independent, he has some Ads from previous hospitalizations but has not needed them in some time. Pt is at mod I/stand by level of assist with all functional mobility this am. He was able to ambulate approx 250ft with stand by assist, on room air, and noted to desat to min of 86%. Ambulation on 2L/min via [place] pt was able to maintain sats  >90%. Pt will benefit from continued PT tx while in hospital to address independence and activity tolerance, at d/c he will be safe to return home with spouse and should not require any further PT follow up.     Follow Up Recommendations No PT follow up    Equipment Recommendations  None recommended by PT    Recommendations for Other Services       Precautions / Restrictions Precautions Precautions: None Restrictions Weight Bearing Restrictions: No      Mobility  Bed Mobility Overal bed mobility: Modified Independent                Transfers Overall transfer level: Modified independent Equipment used: None Transfers: Sit to/from [employer] Transfers Sit to Stand: Modified independent (Device/Increase time) Stand pivot transfers: Modified independent (Device/Increase time)          Ambulation/Gait Ambulation/Gait assistance: Supervision Gait Distance (Feet): 200 Feet Assistive device: None Gait Pattern/deviations: WFL(Within Functional Limits)   Gait velocity interpretation: >2.62 ft/sec, indicative of community ambulatory    Stairs            Wheelchair Mobility    Modified Rankin (Stroke Patients Only)       Balance Overall balance assessment: No apparent balance deficits (not formally assessed)  Pertinent Vitals/Pain Pain Assessment: No/denies pain    Home Living Family/patient expects to be discharged to:: Private residence Living Arrangements: Spouse/significant other Available Help at Discharge: Family Type of Home: House Home Access: Stairs to enter Entrance Stairs-Rails: Right Entrance Stairs-Number of Steps: 6(back door) Home Layout: One level(w basement) Home Equipment: Clinical cytogeneticist - 2 wheels      Prior Function Level of Independence: Independent               Hand Dominance   Dominant Hand: Right    Extremity/Trunk Assessment         Lower Extremity Assessment Lower Extremity Assessment: Generalized weakness    Cervical / Trunk Assessment Cervical / Trunk Assessment: Normal  Communication   Communication: No difficulties  Cognition Arousal/Alertness: Awake/alert Behavior During Therapy: WFL for tasks assessed/performed Overall Cognitive Status: Within Functional Limits for tasks assessed                                        General Comments      Exercises Other Exercises Other Exercises: incentive spirometer, pulls 2071ml Other Exercises: flutter valve   Assessment/Plan    PT Assessment Patient needs continued PT services  PT Problem List Decreased activity tolerance;Decreased mobility;Decreased coordination       PT Treatment Interventions Gait training;Stair training;Functional mobility training;Therapeutic activities;Therapeutic exercise;Balance training;Neuromuscular re-education;Patient/family education    PT Goals (Current goals can be found in the Care Plan section)  Acute Rehab PT Goals Patient Stated Goal: states he wants to go home PT Goal Formulation: With patient Time For Goal Achievement: 02/10/20 Potential to Achieve Goals: Good    Frequency Min 3X/week   Barriers to discharge        Co-evaluation               AM-PAC PT "6 Clicks" Mobility  Outcome Measure Help needed turning from your back to your side while in a flat bed without using bedrails?: None Help needed moving from lying on your back to sitting on the side of a flat bed without using bedrails?: None Help needed moving to and from a bed to a chair (including a wheelchair)?: None Help needed standing up from a chair using your arms (e.g., wheelchair or bedside chair)?: None Help needed to walk in hospital room?: A Little Help needed climbing 3-5 steps with a railing? : A Little 6 Click Score: 22    End of Session Equipment Utilized During Treatment: Oxygen Activity Tolerance: Patient  limited by fatigue;Patient limited by lethargy Patient left: in chair;with call bell/phone within reach   PT Visit Diagnosis: Other abnormalities of gait and mobility (R26.89)    Time: 1001-1026 PT Time Calculation (min) (ACUTE ONLY): 25 min   Charges:   PT Evaluation $PT Eval Moderate Complexity: 1 Mod PT Treatments $Gait Training: 8-22 mins        Horald Chestnut, PT   Delford Field 01/27/2020, 1:10 PM

## 2020-01-27 NOTE — TOC Progression Note (Signed)
Transition of Care [facility]) - Progression Note    Patient Details  Name: Devin Huffman MRN: [id number] Date of Birth: 13-Jul-1946  Transition of Care [facility]) CM/SW Contact  Joaquin Courts, RN Phone Number: 01/27/2020, 11:08 AM  Clinical Narrative:    CM spoke with patient regarding need for home O2. Patient set up with [name], rep Learta Codding given referral. [name] to deliver concentrator to home along with portable tank.  Spouse will be picking patient up at time of dc and will bring portable tank with her for transport.     Expected Discharge Plan: Home/Self Care Barriers to Discharge: No Barriers Identified  Expected Discharge Plan and Services Expected Discharge Plan: Home/Self Care   Discharge Planning Services: CM Consult Post Acute Care Choice: Durable Medical Equipment Living arrangements for the past 2 months: Single Family Home                 DME Arranged: Oxygen DME Agency: [place] Date DME Agency Contacted: 01/27/20 Time DME Agency Contacted: 1107 Representative spoke with at DME Agency: Learta Codding HH Arranged: NA [place] Agency: NA         Social Determinants of Health ([place]) Interventions    Readmission Risk Interventions No flowsheet data found.

## 2020-01-27 NOTE — TOC Progression Note (Signed)
Transition of Care [facility]) - Progression Note    Patient Details  Name: Devin Huffman MRN: [id number] Date of Birth: 29-Feb-1946  Transition of Care [facility]) CM/SW Contact  Joaquin Courts, RN Phone Number: 01/27/2020, 3:42 PM  Clinical Narrative:  CM received call from [facility] rep Amy, stating that when there ran patient's insurance it was found that the medicare plan is secondary and an [place] medicare plan is primary.  [facility] is OON with the aetna plan and is unable to serve patient.  CM reached out to [facility] who states that while they are traditionally out of network with aetna, they can take patient's once they have met their out of pocket maximum for the year as the insurance will cover 100% of costs at that point.  [facility] will service patient and they will run patient's insurance to ensure their will be no out of pocket cost to patient, should they be unable to service for any reason, [facility] rep states she will search for an in network provider.       Expected Discharge Plan: Home/Self Care Barriers to Discharge: No Barriers Identified  Expected Discharge Plan and Services Expected Discharge Plan: Home/Self Care   Discharge Planning Services: CM Consult Post Acute Care Choice: Durable Medical Equipment Living arrangements for the past 2 months: Single Family Home Expected Discharge Date: 01/27/20               DME Arranged: Oxygen DME Agency: [place] Date DME Agency Contacted: 01/27/20 Time DME Agency Contacted: 1107 Representative spoke with at DME Agency: Learta Codding HH Arranged: RN [facility] Agency: [place] Date [place]: 01/27/20 Time [place]: [id number] Representative spoke with at [place]: [place] ([place]) Interventions    Readmission Risk Interventions No flowsheet data found.

## 2020-01-27 NOTE — Plan of Care (Signed)
  Problem: Education: Goal: Knowledge of risk factors and measures for prevention of condition will improve Outcome: Adequate for Discharge   Problem: Coping: Goal: Psychosocial and spiritual needs will be supported Outcome: Adequate for Discharge   Problem: Respiratory: Goal: Will maintain a patent airway Outcome: Adequate for Discharge Goal: Complications related to the disease process, condition or treatment will be avoided or minimized Outcome: Adequate for Discharge   Problem: Education: Goal: Ability to describe self-care measures that may prevent or decrease complications (Diabetes Survival Skills Education) will improve Outcome: Adequate for Discharge Goal: Individualized Educational Video(s) Outcome: Adequate for Discharge   Problem: Coping: Goal: Ability to adjust to condition or change in health will improve Outcome: Adequate for Discharge   Problem: Fluid Volume: Goal: Ability to maintain a balanced intake and output will improve Outcome: Adequate for Discharge   Problem: Health Behavior/Discharge Planning: Goal: Ability to identify and utilize available resources and services will improve Outcome: Adequate for Discharge Goal: Ability to manage health-related needs will improve Outcome: Adequate for Discharge   Problem: Metabolic: Goal: Ability to maintain appropriate glucose levels will improve Outcome: Adequate for Discharge   Problem: Nutritional: Goal: Maintenance of adequate nutrition will improve Outcome: Adequate for Discharge Goal: Progress toward achieving an optimal weight will improve Outcome: Adequate for Discharge   Problem: Skin Integrity: Goal: Risk for impaired skin integrity will decrease Outcome: Adequate for Discharge   Problem: Tissue Perfusion: Goal: Adequacy of tissue perfusion will improve Outcome: Adequate for Discharge

## 2020-01-27 NOTE — Care Management Important Message (Signed)
Important Message  Patient Details  Name: Devin Huffman MRN: [id number] Date of Birth: 18-Nov-1946   Medicare Important Message Given:  Yes - Important Message mailed due to current National Emergency   Verbal consent obtained due to current National Emergency  Relationship to patient: Self Contact Name: Karmell Baggerly Call Date: 01/27/20  Time: [place] Phone: [phone], [phone] each Outcome: No Answer/Busy(spoke wtih spouse Hassan Rowan at [phone] but was asked to contact the patient) Important Message mailed to: Patient address on file     [name] 01/27/2020, 3:45 PM

## 2020-01-27 NOTE — TOC Progression Note (Signed)
Transition of Care [facility]) - Progression Note    Patient Details  Name: Devin Huffman MRN: [id number] Date of Birth: 1946/03/03  Transition of Care [facility]) CM/SW Contact  Joaquin Courts, RN Phone Number: 01/27/2020, 12:08 PM  Clinical Narrative:    Patient set up with [facility] with [facility]. Referral given to rep, Amy.     Expected Discharge Plan: Home/Self Care Barriers to Discharge: No Barriers Identified  Expected Discharge Plan and Services Expected Discharge Plan: Home/Self Care   Discharge Planning Services: CM Consult Post Acute Care Choice: Durable Medical Equipment Living arrangements for the past 2 months: Single Family Home Expected Discharge Date: 01/27/20               DME Arranged: Oxygen DME Agency: [place] Date DME Agency Contacted: 01/27/20 Time DME Agency Contacted: 1107 Representative spoke with at DME Agency: Learta Codding HH Arranged: RN [place] Agency: [facility] [place] Date [place]: 01/27/20 Time [place]: 1207 Representative spoke with at [place]: Amy   Social Determinants of Health ([place]) Interventions    Readmission Risk Interventions No flowsheet data found.

## 2020-01-28 ENCOUNTER — Telehealth: Payer: Self-pay

## 2020-01-28 ENCOUNTER — Encounter: Payer: Self-pay | Admitting: Family Medicine

## 2020-01-28 ENCOUNTER — Telehealth: Payer: Self-pay | Admitting: Family Medicine

## 2020-01-28 ENCOUNTER — Ambulatory Visit (INDEPENDENT_AMBULATORY_CARE_PROVIDER_SITE_OTHER): Payer: Medicare HMO | Admitting: Family Medicine

## 2020-01-28 VITALS — BP 135/73 | HR 83 | Temp 97.4°F | Ht 72.0 in | Wt 227.0 lb

## 2020-01-28 DIAGNOSIS — E1169 Type 2 diabetes mellitus with other specified complication: Secondary | ICD-10-CM

## 2020-01-28 DIAGNOSIS — J1282 Pneumonia due to coronavirus disease 2019: Secondary | ICD-10-CM

## 2020-01-28 DIAGNOSIS — U071 COVID-19: Secondary | ICD-10-CM

## 2020-01-28 DIAGNOSIS — E785 Hyperlipidemia, unspecified: Secondary | ICD-10-CM

## 2020-01-28 DIAGNOSIS — Z794 Long term (current) use of insulin: Secondary | ICD-10-CM | POA: Diagnosis not present

## 2020-01-28 NOTE — Progress Notes (Signed)
Devin Huffman - 74 y.o. male  MRN [id number]  Date of Birth: 08-11-1946  PCP: Ria Bush, MD  This service was provided via telemedicine. Phone Visit performed on 01/28/2020    Rationale for phone visit along with limitations reviewed. Patient consented to telephone encounter.    Location of patient: at home Location of provider: in office, [place] @ [facility] Name of referring provider: N/A   Names of persons and role in encounter: Provider: Ria Bush, MD  Patient: Devin Huffman  Other: Wife Hassan Rowan also present   Time on call: 3:55pm - 4:18pm   Subjective: Chief Complaint  Patient presents with  . Hospitalization Follow-up     HPI:  Recent 5d hospitalization for Covid-19 pneumonia and acute hypoxic respiratory failure. Treated with suplemental oxygen, remdesivir and decadron with quick improvement. Discharged to complete 4 more days of dexamethasone.   During hospitalization was found to have worsened sugar control with A1c up to 9% in setting of corticosteroid use. He was sent home with linagliptin (tradjenta), novolog 70/30 50u bid, and metformin 500mg  bid (all new). Lady Kapil was unaffordable ($420). They did not Huffman up metformin either (apparently pharmacy did not have this). Prior A1c 7.2% (06/2019). They did not Huffman up glucometer (was also unavailable at pharmacy) - planning to return for these.   Ongoing weakness but overall feeling better.  HH involved, has oxygen at home. Patient is unsure if he needs home oxygen.  Pulse ox on room air is staying 91% but notes drop to 88% with exertion.  He is doing well with regular incentive spirometry use.    Admit date: 01/22/2020 Discharge date: 01/27/2020 TCM hosp f/u phone call completed: 01/28/2020 Admitted From: Home  Disposition: Home  Recommendations for Outpatient Follow-up:  1. Follow up with PCP in 1-2 weeks 2. Please obtain BMP/CBC in one week  Home Health: Yes Equipment/Devices:  Oxygen  Discharge Condition: Stable CODE STATUS: Full Diet recommendation: Diabetic diet  Discharge Diagnoses:  Principal Problem:   Pneumonia due to COVID-19 virus Active Problems:   Dyslipidemia   Essential hypertension   Type 2 diabetes mellitus with other specified complication (HCC)   Obesity, Class I, BMI 30-34.9   PVD (peripheral vascular disease) ([place])   Respiratory failure with hypoxia (HCC)   Objective/Observations:  No physical exam or vital signs collected unless specifically identified below.   BP 135/73   Pulse 83   Temp (!) 97.4 F (36.3 C)   Ht 6' (1.829 m)   Wt 227 lb (103 kg)   SpO2 91%   BMI 30.79 kg/m    Respiratory status: speaks in complete sentences without evident shortness of breath.   DG Chest Port 1 View CLINICAL DATA:  Worsening shortness of breath. COVID diagnosis 01/15/2020.  EXAM: PORTABLE CHEST 1 VIEW  COMPARISON:  Radiograph 03/26/2017  FINDINGS: Patchy and streaky opacities in both lung bases and right midlung zone. Overall lung volumes are low. Unchanged heart size and mediastinal contours. No pulmonary edema or large pleural effusion. No pneumothorax. No acute osseous abnormalities are seen.  IMPRESSION: Heterogeneous bibasilar and right midlung opacities consistent with multifocal pneumonia, pattern typical of COVID-19.  Electronically Signed   By: Keith Rake M.D.   On: 01/22/2020 16:29    Assessment/Plan:  Pneumonia due to COVID-19 virus Reviewed recent hospitalization with patient and wife, questions answered. Fortunately he is significantly improved since hospitalization. Discussed O2 use - rec place when ambulatory, may stay off at rest esp if O2 sats  staying >92%. Will ask to return in 1.5 wks for re assessment and ambulatory pulse ox at that time. See above re diabetes plan.   Dyslipidemia associated with type 2 diabetes mellitus ([place]) He was unsure if he was to continue crestor - advised continue this.    Type 2 diabetes mellitus with other specified complication ([place]) Again deteriorated, but in setting of covid illness and decadron use.  He was started on novolog 70/30 50u BID, tradjenta, and metformin during recent hospitalization. Anticipate he will not need all of this for glycemic control. tradjenta is unaffordable so advised not start this. He will Huffman up metformin and continue novolog 70/30 50u bid, but advised of need to closely monitor sugars especially coming off decadron as anticipate will be able to quickly decrease dosing to avoid hypoglycemia. I asked him to start keeping cbg log and bring in to appt next week. Hypoglycemia symptoms reviewed.    I discussed the assessment and treatment plan with the patient. The patient was provided an opportunity to ask questions and all were answered. The patient agreed with the plan and demonstrated an understanding of the instructions.  Lab Orders  No laboratory test(s) ordered today    No orders of the defined types were placed in this encounter.   The patient was advised to call back or seek an in-person evaluation if the symptoms worsen or if the condition fails to improve as anticipated.  Ria Bush, MD

## 2020-01-28 NOTE — Telephone Encounter (Signed)
See today's note. Will hold tradjenta for now, await sugar readings.

## 2020-01-28 NOTE — Telephone Encounter (Signed)
Hassan Rowan (spouse) called stating the medication  linagliptin tradjenta 5mg  tab.  She stated this going to be $420.  [facility] does not have coupon for this.  She wanted to know if there is something else he can take  [facility] [address]  Pt has appointment today at 3:15  Her daughter is going to pick up rx today Please advise when called in

## 2020-01-28 NOTE — Telephone Encounter (Signed)
Called home and cell number and no one answered. Voicemail box full, can't leave message. Need to complete TCM and schedule follow up visit.

## 2020-01-28 NOTE — Telephone Encounter (Signed)
Transition Care Management Follow-up Telephone Call  Date of discharge and from where: 01/27/2020, [facility]  How have you been since you were released from the hospital? Patient is doing very well. States he feels great.  Any questions or concerns? No   Items Reviewed:  Did the pt receive and understand the discharge instructions provided? Yes   Medications obtained and verified? Yes   Any new allergies since your discharge? No   Dietary orders reviewed? Yes  Do you have support at home? Yes   Functional Questionnaire: (I = Independent and D = Dependent) ADLs: I  Bathing/Dressing- I  Meal Prep- i  Eating- I  Maintaining continence- I  Transferring/Ambulation- I  Managing Meds- I  Follow up appointments reviewed:   PCP Hospital f/u appt confirmed? Yes  Scheduled to see Dr. Danise Mina on 01/28/2020 @ 3:15 pm via phone.  [facility] f/u appt confirmed? N/A  Are transportation arrangements needed? No   If their condition worsens, is the pt aware to call PCP or go to the Emergency Dept.? Yes  Was the patient provided with contact information for the PCP's office or ED? Yes  Was to pt encouraged to call back with questions or concerns? Yes

## 2020-01-29 NOTE — Assessment & Plan Note (Signed)
Reviewed recent hospitalization with patient and wife, questions answered. Fortunately he is significantly improved since hospitalization. Discussed O2 use - rec place when ambulatory, may stay off at rest esp if O2 sats staying >92%. Will ask to return in 1.5 wks for re assessment and ambulatory pulse ox at that time. See above re diabetes plan.

## 2020-01-29 NOTE — Assessment & Plan Note (Signed)
He was unsure if he was to continue crestor - advised continue this.

## 2020-01-29 NOTE — Assessment & Plan Note (Signed)
Again deteriorated, but in setting of covid illness and decadron use.  He was started on novolog 70/30 50u BID, tradjenta, and metformin during recent hospitalization. Anticipate he will not need all of this for glycemic control. tradjenta is unaffordable so advised not start this. He will pick up metformin and continue novolog 70/30 50u bid, but advised of need to closely monitor sugars especially coming off decadron as anticipate will be able to quickly decrease dosing to avoid hypoglycemia. I asked him to start keeping cbg log and bring in to appt next week. Hypoglycemia symptoms reviewed.

## 2020-02-02 ENCOUNTER — Ambulatory Visit: Payer: Medicare HMO | Admitting: Family Medicine

## 2020-02-06 ENCOUNTER — Ambulatory Visit (INDEPENDENT_AMBULATORY_CARE_PROVIDER_SITE_OTHER): Payer: Medicare HMO

## 2020-02-06 ENCOUNTER — Other Ambulatory Visit: Payer: Self-pay

## 2020-02-06 ENCOUNTER — Ambulatory Visit (INDEPENDENT_AMBULATORY_CARE_PROVIDER_SITE_OTHER): Payer: Medicare HMO | Admitting: Family Medicine

## 2020-02-06 ENCOUNTER — Telehealth: Payer: Self-pay | Admitting: Family Medicine

## 2020-02-06 ENCOUNTER — Encounter: Payer: Self-pay | Admitting: Family Medicine

## 2020-02-06 VITALS — BP 168/84 | HR 97 | Temp 98.7°F | Resp 16 | Wt 228.0 lb

## 2020-02-06 DIAGNOSIS — J1282 Pneumonia due to coronavirus disease 2019: Secondary | ICD-10-CM

## 2020-02-06 DIAGNOSIS — U071 COVID-19: Secondary | ICD-10-CM

## 2020-02-06 DIAGNOSIS — R0602 Shortness of breath: Secondary | ICD-10-CM | POA: Diagnosis not present

## 2020-02-06 DIAGNOSIS — R918 Other nonspecific abnormal finding of lung field: Secondary | ICD-10-CM | POA: Diagnosis not present

## 2020-02-06 MED ORDER — ALBUTEROL SULFATE HFA 108 (90 BASE) MCG/ACT IN AERS: 2.0000 | INHALATION_SPRAY | RESPIRATORY_TRACT | Status: DC

## 2020-02-06 NOTE — Progress Notes (Signed)
Patient ID: Devin Huffman, male    DOB: Dec 31, 1945, 74 y.o.   MRN: [id number]  PCP: Ria Bush, MD  Chief Complaint  Patient presents with  . Cough  . Shortness of Breath    Subjective:  HPI Devin Huffman is a 74 y.o. male presents to [facility] for evaluation of symptoms related COVID-19 infection.   Patient diagnosed with COVID-19 01/17/20.Received monoclonial antibodies on 01/18/20.Patient subsequently developed respiratory distress on 01/22/20 and was admitted to [facility] medical center for complications related to COVID-19. He was discharged on 01/27/20 with 4 days of Decadron and developed persistently worsening shortness of breath following completion of treatment. Due to high dose steroid therapy, patient subsequently developed hyperglycemia. Checking pulse ox daily 93-94%. He is also checking his blood sugar and readings and he has had readings up to 300, although recently most less than 200. He is prescribed Humalog 70/30 but chooses to only use if blood sugar is greater than 200.  Blood pressure is elevated today. He doesn't check his blood pressure at home. Denies HA, dizziness, or chest pain.  Review of Systems Pertinent negatives listed in HPI  Patient Active Problem List   Diagnosis Date Noted  . Pneumonia due to COVID-19 virus 01/21/2020  . PVD (peripheral vascular disease) ([place]) 09/10/2019  . Hematuria 11/29/2018  . Right wrist pain 10/13/2018  . Encounter for routine adult medical exam with abnormal findings 05/28/2017  . Foot pain, bilateral 05/28/2017  . Increased prostate specific antigen (PSA) velocity 05/28/2017  . Skin rash 05/28/2017  . Tachycardia 03/26/2017  . History of right hip replacement 03/26/2017  . OA (osteoarthritis) of hip 03/07/2017  . 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 Type 2 diabetes mellitus with diabetic neuropathy, unspecified ([place]) 08/31/2016  . Obesity, Class I, BMI 30-34.9 05/14/2015  . Advanced care  planning/counseling discussion 11/11/2014  . Vitamin B12 deficiency 10/15/2012  . Medicare annual wellness visit, subsequent 10/02/2011  . GERD (gastroesophageal reflux disease) 10/02/2011  . Dyslipidemia associated with type 2 diabetes mellitus ([place]) 07/15/2009  . Gout 07/15/2009  . Type 2 diabetes mellitus with other specified complication ([place]) 84/53/6468  . Essential hypertension 06/23/2008      Prior to Admission medications   Medication Sig Start Date End Date Taking? Authorizing Provider  amLODipine (NORVASC) 5 MG tablet Take 1 tablet (5 mg total) by mouth at bedtime. 07/10/19  Yes Ria Bush, MD  aspirin EC 81 MG tablet Take 81 mg by mouth every evening.  04/03/17  Yes Ria Bush, MD  B Complex-Folic Acid (B COMPLEX-VITAMIN B12 PO) Take 1 tablet by mouth every evening.    Yes [provider]  blood glucose meter kit and supplies KIT Dispense based on patient and insurance preference. Use up to four times daily as directed. (FOR ICD-9 250.00, 250.01). 01/27/20  Yes Little Ishikawa, MD  Cholecalciferol (VITAMIN D3) 20 MCG TABS Take 25 mcg by mouth every evening.    Yes [provider]  Coenzyme Q10 50 MG CAPS Take 1 capsule (50 mg total) by mouth daily. Patient taking differently: Take 1 capsule by mouth every evening.  06/03/18  Yes Ria Bush, MD  dexamethasone (DECADRON) 6 MG tablet Take 1 tablet (6 mg total) by mouth daily. 01/27/20  Yes Little Ishikawa, MD  indomethacin (INDOCIN) 50 MG capsule TAKE 1 CAPSULE BY MOUTH THREE TIMES DAILY AS NEEDED Patient taking differently: Take 50 mg by mouth 3 (three) times daily as needed for moderate pain. TAKE 1  CAPSULE BY MOUTH THREE TIMES DAILY AS NEEDED. 05/20/18  Yes Ria Bush, MD  Insulin Isophane & Regular Human (NOVOLIN 70/30 FLEXPEN RELION) (70-30) 100 UNIT/ML PEN Inject 50 Units into the skin 2 (two) times daily. 01/27/20  Yes Little Ishikawa, MD  Insulin Pen Needle (PEN NEEDLES  3/16") 31G X 5 MM MISC 1 Container by Does not apply route 2 (two) times daily. 01/27/20  Yes Little Ishikawa, MD  metFORMIN (GLUCOPHAGE) 500 MG tablet Take 1 tablet (500 mg total) by mouth 2 (two) times daily with a meal. 01/27/20  Yes Little Ishikawa, MD  metoprolol succinate (TOPROL-XL) 25 MG 24 hr tablet Take 1 tablet (25 mg total) by mouth at bedtime. 07/10/19  Yes Ria Bush, MD  Omega-3 Fatty Acids (FISH OIL) 1000 MG CAPS Take 1 capsule (1,000 mg total) by mouth 2 (two) times a day. Patient taking differently: Take 1,000 mg by mouth every evening.  07/10/19  Yes Ria Bush, MD  omeprazole (PRILOSEC) 20 MG capsule TAKE ONE CAPSULE BY MOUTH ONCE DAILY AS NEEDED Patient taking differently: Take 20 mg by mouth daily as needed (indigestion).  10/18/17  Yes Ria Bush, MD  rosuvastatin (CRESTOR) 5 MG tablet Take 1 tablet (5 mg total) by mouth daily. 07/10/19  Yes Ria Bush, MD  vitamin C (ASCORBIC ACID) 500 MG tablet Take 500 mg by mouth every evening.    Yes [provider]  lisinopril (PRINIVIL,ZESTRIL) 5 MG tablet Take 1 tablet (5 mg total) by mouth daily. 10/02/11 10/11/18  Ria Bush, MD    Past Medical, Surgical Family and Social History reviewed and updated.    Objective:   Today's Vitals   02/06/20 1843  BP: (!) 168/84  Pulse: 97  Resp: 16  Temp: 98.7 F (37.1 C)  TempSrc: Oral  SpO2: 94%  Weight: 228 lb (103.4 kg)    Wt Readings from Last 3 Encounters:  02/06/20 228 lb (103.4 kg)  01/28/20 227 lb (103 kg)  01/23/20 228 lb 9.9 oz (103.7 kg)     Physical Exam Constitutional:      Appearance: He is ill-appearing.  Cardiovascular:     Rate and Rhythm: Normal rate and regular rhythm.     Comments: ECG: NSR, negative ST depression or elevation  Pulmonary:     Breath sounds: Examination of the right-upper field reveals rhonchi. Examination of the left-upper field reveals rhonchi. Examination of the right-middle field  reveals rhonchi. Examination of the left-middle field reveals rhonchi. Wheezing and rhonchi present.  Skin:    General: Skin is warm and dry.  Neurological:     General: No focal deficit present.     Mental Status: He is alert and oriented to person, place, and time.  Psychiatric:        Attention and Perception: Attention normal.        Mood and Affect: Mood normal.     Assessment & Plan:  1. COVID-19, symptomatic, afebrile, likely post-COVID symptoms  - DG Chest Portable 2 Views; Future -Pt follows up with PCP on Tuesday, did not obtain labs due to upcoming PCP appointment  2. Pneumonia due to COVID-19 virus -Prior imaging confirmed PNA related to COVID. Repeating CXR, results pending. Patient opted to leave clinic and not wait for results.  Start albuterol (VENTOLIN HFA) 108 (90 Base) MCG/ACT inhaler 2 puff every 4 hours PRN for shortness of breath Will address need for antibiotics and or additional prednisone once imaging results   3. Shortness of breath  See #2 - DG Chest Portable 2 Views; Future - albuterol (VENTOLIN HFA) 108 (90 Base) MCG/ACT inhaler 2 puff    ADDENDUM: 02/07/20 Patient notifed of PNA which has worsened compared to prior study. Will start Omnicef 600 mg once daily x 10 days Prednisone 20 mg with breakfast daily x 5 days -lower dose prescribed given diabetes. Encouraged to check sugar. Pt is scheduled to see PCP on 02/10/20   -The patient was given clear instructions to go to ER or return to medical center if symptoms do not improve, worsen or new problems develop. The patient verbalized understanding.     Molli Barrows, FNP-C [facility], PRN Provider  [facility]. [place], [facility] Phone: [phone] Clinic Fax: [phone] Clinic Hours: 5:30 pm -7:30 pm (Monday-Friday)

## 2020-02-06 NOTE — Patient Instructions (Signed)
Shortness of Breath, Adult Shortness of breath means you have trouble breathing. Shortness of breath could be a sign of a medical problem. Follow these instructions at home:   Watch for any changes in your symptoms.  Do not use any products that contain nicotine or tobacco, such as cigarettes, e-cigarettes, and chewing tobacco.  Do not smoke. Smoking can cause shortness of breath. If you need help to quit smoking, ask your doctor.  Avoid things that can make it harder to breathe, such as: ? Mold. ? Dust. ? Air pollution. ? Chemical smells. ? Things that can cause allergy symptoms (allergens), if you have allergies.  Keep your living space clean. Use products that help remove mold and dust.  Rest as needed. Slowly return to your normal activities.  Take over-the-counter and prescription medicines only as told by your doctor. This includes oxygen therapy and inhaled medicines.  Keep all follow-up visits as told by your doctor. This is important. Contact a doctor if:  Your condition does not get better as soon as expected.  You have a hard time doing your normal activities, even after you rest.  You have new symptoms. Get help right away if:  Your shortness of breath gets worse.  You have trouble breathing when you are resting.  You feel light-headed or you pass out (faint).  You have a cough that is not helped by medicines.  You cough up blood.  You have pain with breathing.  You have pain in your chest, arms, shoulders, or belly (abdomen).  You have a fever.  You cannot walk up stairs.  You cannot exercise the way you normally do. These symptoms may represent a serious problem that is an emergency. Do not wait to see if the symptoms will go away. Get medical help right away. Call your local emergency services (911 in the [place] Do not drive yourself to the hospital. Summary  Shortness of breath is when you have trouble breathing enough air. It can be a sign of a  medical problem.  Avoid things that make it hard for you to breathe, such as smoking, pollution, mold, and dust.  Watch for any changes in your symptoms. Contact your doctor if you do not get better or you get worse. This information is not intended to replace advice given to you by your health care provider. Make sure you discuss any questions you have with your health care provider. Document Revised: 04/29/2018 Document Reviewed: 04/29/2018 Elsevier Patient Education  2020 Elsevier Inc.  

## 2020-02-06 NOTE — Telephone Encounter (Signed)
[place] [facility] Day - Client TELEPHONE ADVICE RECORD [facility] Patient Name: Devin Huffman Gender: Male DOB: March 11, 1946 Age: 74 Y 70 M 16 D Return Phone Number: [phone] (Primary), [phone] (Secondary) Address: [place] [address] Client Devin Huffman Day - Client Client Site [facility]ician Devin Huffman - MD Contact Type Call Who Is Calling Patient / Member / Family / Caregiver Call Type Triage / Clinical Caller Name Devin Huffman Relationship To Patient Spouse Return Phone Number [phone] (Primary) Chief Complaint Fever (non urgent symptom) (> THREE MONTHS) Reason for Call Symptomatic / Request for [name] states her husband was discharged from the hospital on February 16 with Covid. He is not feeling well and has a current temperature of 99.2. His eyes look glassy. He has just taken ibupofen. Translation No Nurse Assessment Nurse: Devin Retort, RN, Devin Huffman Date/Time Devin Huffman Time): 02/06/2020 12:48:01 PM Confirm and document reason for call. If symptomatic, describe symptoms. ---Caller states her husband was discharged from the hospital on February 16 with Covid19. He is not feeling well today and has a current temperature of 99.2, oral. Ibuprofen administered around thirty minutes ago. Temp retaken at this time and it reads 98.7, orally. No other s/s noted at this time. Drinking and eating well. Has the patient had close contact with a person known or suspected to have the novel coronavirus illness OR traveled / lives in area with major community spread (including international travel) in the last 14 days from the onset of symptoms? * If Asymptomatic, screen for exposure and travel within the last 14 days. ---Yes Does the patient have any new or worsening symptoms? ---Yes Will a triage be completed? ---Yes Related visit to physician  within the last 2 weeks? ---Yes Does the PT have any chronic conditions? (i.e. diabetes, asthma, this includes High risk factors for pregnancy, etc.) ---Yes List chronic conditions. ---Diabetes, HTN Is this a behavioral health or substance abuse call? ---No PLEASE NOTE: All timestamps contained within this report are represented as [place] Standard Time. CONFIDENTIALTY NOTICE: This fax transmission is intended only for the addressee. It contains information that is legally privileged, confidential or otherwise protected from use or disclosure. If you are not the intended recipient, you are strictly prohibited from reviewing, disclosing, copying using or disseminating any of this information or taking any action in reliance on or regarding this information. If you have received this fax in error, please notify us immediately by telephone so that we can arrange for its return to [place]. Phone: [phone], Toll-Free: [phone], Fax: [phone] Page: 2 of 2 Call Id: [id number] Guidelines Guideline Title Affirmed Question Affirmed Notes Nurse Date/Time Devin Huffman Time) Coronavirus (COVID-19) - Diagnosed or Suspected [1] HIGH RISK patient (e.g., age > 82 years, diabetes, heart or lung disease, weak immune system) AND [2] new or worsening symptoms Devin Huffman 02/06/2020 12:53:19 PM Disp. Time Devin Huffman Time) Disposition Final User 02/06/2020 1:01:18 PM Call PCP Now Yes Devin Retort, RN, Renne Musca Disagree/Comply Comply Caller Understands Yes PreDisposition Did not know what to do Care Advice Given Per Guideline CALL PCP NOW: * You need to discuss this with your doctor (or NP/PA). HOW TO PROTECT OTHERS - WHEN YOU ARE SICK WITH COVID-19: * COVER THE COUGH: Cough and sneeze into your shirt sleeve or inner elbow. Don't cough into your hand or the air. If available, cough into a tissue and throw it into a trash can. [facility] HANDS OFTEN: Wash  hands often with soap and water. After coughing or  sneezing are important times. If soap and water are not available, use an alcohol-based hand sanitizer with at least 60% alcohol, covering all surfaces of your hands and rubbing them together until they feel dry. Avoid touching your eyes, nose, and mouth with unwashed hands. * WEAR A MASK: Wear a facemask when around others. Always wear a facemask (if available) if you have to leave your home (such as going to a medical facility). * STAY HOME A MINIMUM OF 10 DAYS: Home isolation is needed for at least 10 days after the symptoms started. Stay home from school or work if you are sick. Do NOT go to religious services, child care centers, shopping, or other public places. Do NOT use public transportation (e.g., bus, taxis, ride-sharing). Do NOT allow any visitors to your home. Leave the house only if you need to seek urgent medical care. * CALL FIRST IF MEDICAL CARE NEEDED: Call ahead to get approval and careful directions. FEVER MEDICINES: * For fevers above 101 F (38.3 C) take either acetaminophen or ibuprofen. CALL BACK IF: * You become worse. CARE ADVICE given per CORONAVIRUS (COVID-19) - DIAGNOSED OR SUSPECTED (Adult) guideline. Referrals Warm transfer to backline

## 2020-02-06 NOTE — Telephone Encounter (Signed)
Spoke with wife and patient after receiving team health not about pt "struggling to breathe" recommending office visit.   Covid PNA hospitalized 2/11-16/2021 despite bamlanivimab infusion, completed decadron and remdesivir course.  Now with low grade temperature 99.2 last 2 nights.  Not feeling well the last few days.  Discomfort with deep breaths as well as mild increasing dyspnea noted. Notes more trouble trying to complete incentive spirometry at home.  O2 sat staying at 93-94%.  Has not used supplemental oxygen available at home - doesn't feel he needs this.  Denies leg pain/swelling/redness.  Over the last few days, sugars have been better controlled.  Positive covid test 01/15/2020.   With change in breathing status post covid, I do think he merits in-person evaluation - will see if we can get him scheduled for respiratory clinic today. If not, will need UCC eval.

## 2020-02-06 NOTE — Telephone Encounter (Signed)
Mrs Duggal said would prefer to go to [facility] because she knows where that is.Mrs Gholar notified that pt has appt tonight 02/06/20 at 7 PM at [facility]. UC & ED precautions given and Mrs Amon voiced understanding. FYI to Dr Darnell Level.

## 2020-02-07 MED ORDER — PREDNISONE 10 MG PO TABS: 20.0000 mg | ORAL_TABLET | Freq: Every day | ORAL | 0 refills | Status: DC

## 2020-02-07 MED ORDER — CEFDINIR 300 MG PO CAPS: 600.0000 mg | ORAL_CAPSULE | Freq: Every day | ORAL | 0 refills | Status: DC

## 2020-02-09 ENCOUNTER — Telehealth: Payer: Self-pay | Admitting: Family Medicine

## 2020-02-09 NOTE — Telephone Encounter (Signed)
Unable to leave message  Need to advise patient of Dr Synthia Innocent message about labs.

## 2020-02-09 NOTE — Telephone Encounter (Signed)
Spoke with pt relaying Dr. G's message.  Verbalizes understanding.  

## 2020-02-09 NOTE — Telephone Encounter (Signed)
Patient called to confirm appt for tomorrow. He stated that when he was seen at the resp clinic they were requesting blood work but he knew he had this appointment tomorrow and asked if he could get those done here with [place]. He wanted to know if this is okay? And if he needs to fast? I am not sure what labs they are needing. Are you able to look into this? And are we allowed to do those here for him?    Also, with patient having pneumonia recently is he okay to come in?  When asked the covid screening questions he stated there was no new symptoms.

## 2020-02-09 NOTE — Telephone Encounter (Signed)
Ok to keep in office appt.  We can do labs here Would have him fast 4 hours if able, if not then ok too.

## 2020-02-10 ENCOUNTER — Other Ambulatory Visit: Payer: Self-pay | Admitting: Family Medicine

## 2020-02-10 ENCOUNTER — Other Ambulatory Visit: Payer: Self-pay

## 2020-02-10 ENCOUNTER — Ambulatory Visit (INDEPENDENT_AMBULATORY_CARE_PROVIDER_SITE_OTHER): Payer: Medicare HMO | Admitting: Family Medicine

## 2020-02-10 ENCOUNTER — Encounter: Payer: Self-pay | Admitting: Family Medicine

## 2020-02-10 VITALS — BP 120/70 | HR 71 | Temp 97.8°F | Ht 70.0 in | Wt 224.4 lb

## 2020-02-10 DIAGNOSIS — E1140 Type 2 diabetes mellitus with diabetic neuropathy, unspecified: Secondary | ICD-10-CM

## 2020-02-10 DIAGNOSIS — U071 COVID-19: Secondary | ICD-10-CM

## 2020-02-10 DIAGNOSIS — E1169 Type 2 diabetes mellitus with other specified complication: Secondary | ICD-10-CM

## 2020-02-10 DIAGNOSIS — J1282 Pneumonia due to coronavirus disease 2019: Secondary | ICD-10-CM

## 2020-02-10 DIAGNOSIS — Z794 Long term (current) use of insulin: Secondary | ICD-10-CM

## 2020-02-10 MED ORDER — NOVOLIN 70/30 FLEXPEN RELION (70-30) 100 UNIT/ML ~~LOC~~ SUPN: 50.0000 [IU] | PEN_INJECTOR | Freq: Every evening | SUBCUTANEOUS | 3 refills | Status: DC

## 2020-02-10 NOTE — Patient Instructions (Addendum)
No labs today. You are doing well today.  Continue novolog 70/30 50 units only at night time, hold if sugar <150. Continue metformin.  Schedule lab visit only in 2 weeks  We will want updated chest xray but I would wait 6-8 weeks prior - assuming improving each day.  Return in 2 months for follow up visit.

## 2020-02-10 NOTE — Telephone Encounter (Signed)
Note from pharmacy:  Product Backordered/Unavailable: RELION IS Daniels. [place] 70/30 FLEXPEN. IT MAY COST MORE

## 2020-02-10 NOTE — Progress Notes (Signed)
 This visit was conducted in person.  BP 120/70 (BP Location: Left Arm, Patient Position: Sitting, Cuff Size: Large)   Pulse 71   Temp 97.8 F (36.6 C) (Temporal)   Ht 5' 10" (1.778 m)   Wt 224 lb 7 oz (101.8 kg)   SpO2 92%   BMI 32.20 kg/m    CC: COVID-19 PNA f/u visit Subjective:    Patient ID: Devin Huffman, male    DOB: 01/05/1946, 73 y.o.   MRN: [id number]  HPI: Devin Huffman is a 73 y.o. male presenting on 02/10/2020 for Follow-up (Here for 6 mo f/u.  This mornining BS 119, and 226 last night at 5 PM. )   Covid-19 infection 01/17/2020 s/p monoclonal ab infusion then hospitalization 01/22/2020 with covid pneumonia treated with decadron and remdesivir. 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 Treated with omnicef 600mg 10d course and prednisone 20mg daily x 5 days (lower dose due to diabetes) and albuterol inhaler. Persistent loss of taste.   No significant cough or dyspnea at this time. Energy remains low.   DM - worse control with decadron use, started on novolog 70/30 50u bid, metformin 500mg bid. tradjenta was unaffordable. Checking sugars BID and brings log - 87-130 fasting in am, 150-280s in PM. Holds novolog if cbg <200. Has not been taking AM novolog.   He cancelled home health. Not using oxygen at home. No desaturations even with activity. Requests oxygen removed from home.    DG Chest Portable 2 Views CLINICAL DATA:  Shortness of breath.  COVID-19 pneumonia.  Diabetes.  EXAM: CHEST  2 VIEW PORTABLE  COMPARISON:  01/22/2020  FINDINGS: Patient rotated minimally right on the frontal. Midline trachea. Normal heart size and mediastinal contours. No pleural effusion or pneumothorax. Basilar and peripheral predominant interstitial opacities are again identified. These are felt to be slightly increased, especially in the left  lower lobe. Lung volumes are low.  IMPRESSION: Basilar and peripheral predominant interstitial opacities, felt to be slightly increased, especially on the left. Findings are most consistent with residual COVID-19 pneumonia or developing post infectious fibrosis.  Electronically Signed   By: Kyle  Talbot M.D.   On: 02/07/2020 18:09       Relevant past medical, surgical, family and social history reviewed and updated as indicated. Interim medical history since our last visit reviewed. Allergies and medications reviewed and updated. Outpatient Medications Prior to Visit  Medication Sig Dispense Refill  . amLODipine (NORVASC) 5 MG tablet Take 1 tablet (5 mg total) by mouth at bedtime. 90 tablet 3  . aspirin EC 81 MG tablet Take 81 mg by mouth every evening.     . B Complex-Folic Acid (B COMPLEX-VITAMIN B12 PO) Take 1 tablet by mouth every evening.     . blood glucose meter kit and supplies KIT Dispense based on patient and insurance preference. Use up to four times daily as directed. (FOR ICD-9 250.00, 250.01). 1 each 0  . cefdinir (OMNICEF) 300 MG capsule Take 2 capsules (600 mg total) by mouth daily. 20 capsule 0  . Cholecalciferol (VITAMIN D3) 20 MCG TABS Take 25 mcg by mouth every evening.     . Coenzyme Q10 50 MG CAPS Take 1 capsule (50 mg total) by mouth daily. (Patient taking differently: Take 1 capsule by mouth every evening. )  0  . Insulin Pen Needle (PEN NEEDLES 3/16") 31G X 5   MM MISC 1 Container by Does not apply route 2 (two) times daily. 1 each 0  . metoprolol succinate (TOPROL-XL) 25 MG 24 hr tablet Take 1 tablet (25 mg total) by mouth at bedtime. 90 tablet 3  . Omega-3 Fatty Acids (FISH OIL) 1000 MG CAPS Take 1 capsule (1,000 mg total) by mouth 2 (two) times a day. (Patient taking differently: Take 1,000 mg by mouth every evening. )  0  . omeprazole (PRILOSEC) 20 MG capsule TAKE ONE CAPSULE BY MOUTH ONCE DAILY AS NEEDED (Patient taking differently: Take 20 mg by mouth daily as  needed (indigestion). ) 90 capsule 0  . predniSONE (DELTASONE) 10 MG tablet Take 2 tablets (20 mg total) by mouth daily with breakfast for 5 days. 10 tablet 0  . rosuvastatin (CRESTOR) 5 MG tablet Take 1 tablet (5 mg total) by mouth daily. 90 tablet 3  . vitamin C (ASCORBIC ACID) 500 MG tablet Take 500 mg by mouth every evening.     . Insulin Isophane & Regular Human (NOVOLIN 70/30 FLEXPEN RELION) (70-30) 100 UNIT/ML PEN Inject 50 Units into the skin 2 (two) times daily. 15 mL 11  . Insulin Isophane & Regular Human (NOVOLIN 70/30 FLEXPEN RELION) (70-30) 100 UNIT/ML PEN Inject 50 Units into the skin every evening. With dinner, hold if cbg <150    . indomethacin (INDOCIN) 50 MG capsule TAKE 1 CAPSULE BY MOUTH THREE TIMES DAILY AS NEEDED (Patient taking differently: Take 50 mg by mouth 3 (three) times daily as needed for moderate pain. TAKE 1 CAPSULE BY MOUTH THREE TIMES DAILY AS NEEDED.) 30 capsule 0  . metFORMIN (GLUCOPHAGE) 500 MG tablet Take 1 tablet (500 mg total) by mouth 2 (two) times daily with a meal. (Patient not taking: Reported on 02/10/2020) 60 tablet 0  . dexamethasone (DECADRON) 6 MG tablet Take 1 tablet (6 mg total) by mouth daily. 4 tablet 0   Facility-Administered Medications Prior to Visit  Medication Dose Route Frequency Provider Last Rate Last Admin  . albuterol (VENTOLIN HFA) 108 (90 Base) MCG/ACT inhaler 2 puff  2 puff Inhalation Q4H Harris, Kimberly S, FNP         Per HPI unless specifically indicated in ROS section below Review of Systems Objective:    BP 120/70 (BP Location: Left Arm, Patient Position: Sitting, Cuff Size: Large)   Pulse 71   Temp 97.8 F (36.6 C) (Temporal)   Ht 5' 10" (1.778 m)   Wt 224 lb 7 oz (101.8 kg)   SpO2 92%   BMI 32.20 kg/m   Wt Readings from Last 3 Encounters:  02/10/20 224 lb 7 oz (101.8 kg)  02/06/20 228 lb (103.4 kg)  01/28/20 227 lb (103 kg)    Physical Exam Vitals and nursing note reviewed.  Constitutional:      Appearance:  Normal appearance. He is not ill-appearing.  Eyes:     Extraocular Movements: Extraocular movements intact.     Pupils: Pupils are equal, round, and reactive to light.  Cardiovascular:     Rate and Rhythm: Normal rate and regular rhythm.     Pulses: Normal pulses.     Heart sounds: Normal heart sounds. No murmur.  Pulmonary:     Effort: Pulmonary effort is normal. No respiratory distress.     Breath sounds: Normal breath sounds. No wheezing, rhonchi or rales.     Comments: Coarse crackles bibasilarly Neurological:     Mental Status: He is alert.  Psychiatric:          Mood and Affect: Mood normal.        Behavior: Behavior normal.       Lab Results  Component Value Date   CREATININE 0.86 01/27/2020   BUN 38 (H) 01/27/2020   NA 139 01/27/2020   K 4.3 01/27/2020   CL 104 01/27/2020   CO2 23 01/27/2020    Lab Results  Component Value Date   ALT 28 01/27/2020   AST 13 (L) 01/27/2020   ALKPHOS 66 01/27/2020   BILITOT 0.6 01/27/2020    Lab Results  Component Value Date   WBC 16.6 (H) 01/27/2020   HGB 16.2 01/27/2020   HCT 47.2 01/27/2020   MCV 94.4 01/27/2020   PLT 458 (H) 01/27/2020    Lab Results  Component Value Date   HGBA1C 9.0 (H) 01/25/2020    Assessment & Plan:  This visit occurred during the SARS-CoV-2 public health emergency.  Safety protocols were in place, including screening questions prior to the visit, additional usage of staff PPE, and extensive cleaning of exam room while observing appropriate contact time as indicated for disinfecting solutions.   Problem List Items Addressed This Visit    Type 2 diabetes mellitus with other specified complication (HCC)    Deteriorated with recent decadron and now prednisone courses for Covid illness.  Continue metformin 500mg bid.  Reviewed recall cbg's - am readings seem well controlled, but PM too high. Recommend changing novolog 70/30 to 50u daily with dinner, continue monitoring readings and update me with effect.  He's only been taking insulin once daily. Reviewed need to take this with food. rec start metformin - unsure if he's been taking.       Relevant Medications   Insulin Isophane & Regular Human (NOVOLIN 70/30 FLEXPEN RELION) (70-30) 100 UNIT/ML PEN   Type 2 diabetes mellitus with diabetic neuropathy, unspecified (HCC)   Relevant Medications   Insulin Isophane & Regular Human (NOVOLIN 70/30 FLEXPEN RELION) (70-30) 100 UNIT/ML PEN   Pneumonia due to COVID-19 virus - Primary    Due to worsening dyspnea, referred to respiratory clinic on Friday at which time xray showed worsening interstitial opacities - treated with omnicef course and prednisone burst with improvement. Discussed will need rpt CXR in 1-2 months to follow this. CXR ordered.  Largely improving with residual fatigue. He is not using supplemental oxygen at home, even at rest or at night. Will ask him to do ambulatory pulse ox then if passed will d/c home oxygen order - they use [facility].      Relevant Orders   DG Chest 2 View       Meds ordered this encounter  Medications  . Insulin Isophane & Regular Human (NOVOLIN 70/30 FLEXPEN RELION) (70-30) 100 UNIT/ML PEN    Sig: Inject 50 Units into the skin every evening. With dinner, hold if cbg <150    Dispense:  15 mL    Refill:  3   Orders Placed This Encounter  Procedures  . DG Chest 2 View    Standing Status:   Future    Standing Expiration Date:   04/15/2021    Order Specific Question:   Reason for Exam (SYMPTOM  OR DIAGNOSIS REQUIRED)    Answer:   follow up covid PNA ?fibrosis    Order Specific Question:   Preferred imaging location?    Answer:   [facility]    Order Specific Question:   Radiology Contrast Protocol - do NOT remove file path      Answer:   _[email or web address]    Patient Instructions  No labs today. You are doing well today.  Continue novolog 70/30 50 units only at night time, hold if sugar <150.  Continue metformin.  Schedule lab visit only in 2 weeks  We will want updated chest xray but I would wait 6-8 weeks prior - assuming improving each day.  Return in 2 months for follow up visit.    Follow up plan: Return in about 2 months (around 04/11/2020) for follow up visit.  Ria Bush, MD

## 2020-02-11 ENCOUNTER — Telehealth: Payer: Self-pay | Admitting: Family Medicine

## 2020-02-11 DIAGNOSIS — U071 COVID-19: Secondary | ICD-10-CM

## 2020-02-11 NOTE — Telephone Encounter (Signed)
Patient's wife, Hassan Rowan, called.  Patient saw Dr.G yesterday and Dr.G asked patient to call with information for his oxygen, so it can be returned.  The name of the company is [employer].

## 2020-02-12 NOTE — Telephone Encounter (Addendum)
I don't think we did ambulatory pulse ox when he came into office. If we didn't please have him come in for nurse visit for this prior to discontinuing home oxygen.

## 2020-02-12 NOTE — Telephone Encounter (Signed)
Scheduled pt on 02/19/20 at 8:45.

## 2020-02-14 NOTE — Assessment & Plan Note (Signed)
Deteriorated with recent decadron and now prednisone courses for Covid illness.  Continue metformin 500mg  bid.  Reviewed recall cbg's - am readings seem well controlled, but PM too high. Recommend changing novolog 70/30 to 50u daily with dinner, continue monitoring readings and update me with effect. He's only been taking insulin once daily. Reviewed need to take this with food. rec start metformin - unsure if he's been taking.

## 2020-02-14 NOTE — Assessment & Plan Note (Signed)
Due to worsening dyspnea, referred to respiratory clinic on Friday at which time xray showed worsening interstitial opacities - treated with omnicef course and prednisone burst with improvement. Discussed will need rpt CXR in 1-2 months to follow this. CXR ordered.  Largely improving with residual fatigue. He is not using supplemental oxygen at home, even at rest or at night. Will ask him to do ambulatory pulse ox then if passed will d/c home oxygen order - they use [facility].

## 2020-02-19 ENCOUNTER — Other Ambulatory Visit: Payer: Self-pay

## 2020-02-19 ENCOUNTER — Ambulatory Visit: Payer: Medicare HMO

## 2020-02-19 DIAGNOSIS — J1282 Pneumonia due to coronavirus disease 2019: Secondary | ICD-10-CM

## 2020-02-19 DIAGNOSIS — U071 COVID-19: Secondary | ICD-10-CM

## 2020-02-19 DIAGNOSIS — E1169 Type 2 diabetes mellitus with other specified complication: Secondary | ICD-10-CM

## 2020-02-19 DIAGNOSIS — Z794 Long term (current) use of insulin: Secondary | ICD-10-CM

## 2020-02-19 MED ORDER — RELION TRUE METRIX TEST STRIPS VI STRP: ORAL_STRIP | 0 refills | Status: DC

## 2020-02-19 NOTE — Progress Notes (Signed)
Pt was here for NV today to get ambulatory pulse ox on RA.  Info documented in Care Coordination Note under Problem List.

## 2020-02-19 NOTE — Telephone Encounter (Signed)
Pt was here for NV and requested rx for Relion test strips be sent to [facility].  Sent rx.

## 2020-02-23 ENCOUNTER — Telehealth: Payer: Self-pay

## 2020-02-23 DIAGNOSIS — R69 Illness, unspecified: Secondary | ICD-10-CM | POA: Diagnosis not present

## 2020-02-23 NOTE — Telephone Encounter (Signed)
Error

## 2020-02-24 ENCOUNTER — Other Ambulatory Visit (INDEPENDENT_AMBULATORY_CARE_PROVIDER_SITE_OTHER): Payer: Medicare HMO

## 2020-02-24 ENCOUNTER — Other Ambulatory Visit: Payer: Self-pay

## 2020-02-24 ENCOUNTER — Other Ambulatory Visit: Payer: Self-pay | Admitting: Family Medicine

## 2020-02-24 DIAGNOSIS — Z794 Long term (current) use of insulin: Secondary | ICD-10-CM | POA: Diagnosis not present

## 2020-02-24 DIAGNOSIS — U071 COVID-19: Secondary | ICD-10-CM

## 2020-02-24 DIAGNOSIS — J1282 Pneumonia due to coronavirus disease 2019: Secondary | ICD-10-CM

## 2020-02-24 DIAGNOSIS — E1169 Type 2 diabetes mellitus with other specified complication: Secondary | ICD-10-CM

## 2020-02-24 DIAGNOSIS — R972 Elevated prostate specific antigen [PSA]: Secondary | ICD-10-CM

## 2020-02-24 DIAGNOSIS — E538 Deficiency of other specified B group vitamins: Secondary | ICD-10-CM

## 2020-02-24 DIAGNOSIS — I10 Essential (primary) hypertension: Secondary | ICD-10-CM

## 2020-02-24 DIAGNOSIS — M1A0710 Idiopathic chronic gout, right ankle and foot, without tophus (tophi): Secondary | ICD-10-CM

## 2020-02-24 LAB — COMPREHENSIVE METABOLIC PANEL
ALT: 17 U/L (ref 0–53)
AST: 16 U/L (ref 0–37)
Albumin: 3.7 g/dL (ref 3.5–5.2)
Alkaline Phosphatase: 67 U/L (ref 39–117)
BUN: 10 mg/dL (ref 6–23)
CO2: 27 mEq/L (ref 19–32)
Calcium: 9 mg/dL (ref 8.4–10.5)
Chloride: 103 mEq/L (ref 96–112)
Creatinine, Ser: 0.88 mg/dL (ref 0.40–1.50)
GFR: 84.35 mL/min (ref >60.00)
Glucose, Bld: 150 mg/dL — ABNORMAL HIGH (ref 70–99)
Potassium: 4.5 mEq/L (ref 3.5–5.1)
Sodium: 140 mEq/L (ref 135–145)
Total Bilirubin: 0.8 mg/dL (ref 0.2–1.2)
Total Protein: 6.5 g/dL (ref 6.0–8.3)

## 2020-02-24 LAB — CBC WITH DIFFERENTIAL/PLATELET
Basophils Absolute: 0 10*3/uL (ref 0.0–0.1)
Basophils Relative: 0.2 % (ref 0.0–3.0)
Eosinophils Absolute: 0.4 10*3/uL (ref 0.0–0.7)
Eosinophils Relative: 3.9 % (ref 0.0–5.0)
HCT: 42.8 % (ref 39.0–52.0)
Hemoglobin: 14.4 g/dL (ref 13.0–17.0)
Lymphocytes Relative: 25.5 % (ref 12.0–46.0)
Lymphs Abs: 2.4 10*3/uL (ref 0.7–4.0)
MCHC: 33.7 g/dL (ref 30.0–36.0)
MCV: 99.3 fl (ref 78.0–100.0)
Monocytes Absolute: 1.1 10*3/uL — ABNORMAL HIGH (ref 0.1–1.0)
Monocytes Relative: 12.1 % — ABNORMAL HIGH (ref 3.0–12.0)
Neutro Abs: 5.4 10*3/uL (ref 1.4–7.7)
Neutrophils Relative %: 58.3 % (ref 43.0–77.0)
Platelets: 344 10*3/uL (ref 150.0–400.0)
RBC: 4.31 Mil/uL (ref 4.22–5.81)
RDW: 13.3 % (ref 11.5–15.5)
WBC: 9.3 10*3/uL (ref 4.0–10.5)

## 2020-02-24 LAB — VITAMIN B12: Vitamin B-12: 511 pg/mL (ref 211–911)

## 2020-02-26 NOTE — Telephone Encounter (Signed)
Dr. Darnell Level, not sure if I mentioned it to you, but the pt did come in for ambulatory pulse ox on 02/19/20.  I documented the results in Care Coordination Note under Problem List.

## 2020-02-26 NOTE — Telephone Encounter (Signed)
Hassan Rowan (spouse) called checking to see if order was sent to [facility] to discontinue o2  They are stating they have not received order for pick up  [phone]

## 2020-02-27 NOTE — Addendum Note (Signed)
Addended by: Ria Bush on: 02/27/2020 08:25 AM   Modules accepted: Orders

## 2020-02-27 NOTE — Telephone Encounter (Signed)
Spoke with [name] - they received the order via Epic and are contacting the patient.  Nothing further needed.

## 2020-02-27 NOTE — Telephone Encounter (Signed)
DME ordered to stop O2. Printed and in [employer].

## 2020-02-29 LAB — FRUCTOSAMINE: Fructosamine: 219 umol/L (ref 205–285)

## 2020-03-03 ENCOUNTER — Telehealth: Payer: Self-pay | Admitting: Family Medicine

## 2020-03-03 NOTE — Telephone Encounter (Signed)
Hassan Rowan (spouse) called checking on lab results.  Labs done 3/16

## 2020-03-04 NOTE — Telephone Encounter (Signed)
Spoke with Devin Huffman relaying Dr. Synthia Innocent message.  Devin Huffman verbalizes understanding.  Says he had some tremors but thinks it was COVID related since a friend had the same sxs.   Devin Huffman provided recent BS readings.  Says he has not used insulin since 02/24/20.   3/16 161- took insulin 3/18  127 3/19 130  3/20 131 3/21 140 3/22 147 3/23 139 3/24 135 3/25 138

## 2020-03-04 NOTE — Telephone Encounter (Signed)
Plz notify labs returned overall ok. b12 levels good, blood counts, kidneys and liver normal.  Sugar was better, fructosamine returned equivalent to an A1c of 5.3%!  How are sugars running? Is he having any low sugar symptoms? If so would need to decrease his insulin. Let me know.

## 2020-03-05 MED ORDER — METFORMIN HCL 500 MG PO TABS: ORAL_TABLET | ORAL | 1 refills | Status: DC

## 2020-03-05 NOTE — Addendum Note (Signed)
Addended by: Ria Bush on: 03/05/2020 07:12 AM   Modules accepted: Orders

## 2020-03-05 NOTE — Telephone Encounter (Signed)
Ok to stay off insulin at this time.  Would suggest increasing metformin to 1 in am and 2 in pm.

## 2020-03-05 NOTE — Telephone Encounter (Signed)
Patient advised, patient verbalized understanding. Patient was advised that Metformin was sent in to [facility].

## 2020-04-12 ENCOUNTER — Ambulatory Visit (INDEPENDENT_AMBULATORY_CARE_PROVIDER_SITE_OTHER): Payer: Medicare HMO | Admitting: Family Medicine

## 2020-04-12 ENCOUNTER — Encounter: Payer: Self-pay | Admitting: Family Medicine

## 2020-04-12 ENCOUNTER — Other Ambulatory Visit: Payer: Self-pay

## 2020-04-12 VITALS — BP 128/76 | HR 54 | Temp 97.6°F | Ht 70.0 in | Wt 228.0 lb

## 2020-04-12 DIAGNOSIS — I10 Essential (primary) hypertension: Secondary | ICD-10-CM | POA: Diagnosis not present

## 2020-04-12 DIAGNOSIS — E1169 Type 2 diabetes mellitus with other specified complication: Secondary | ICD-10-CM | POA: Diagnosis not present

## 2020-04-12 DIAGNOSIS — E1140 Type 2 diabetes mellitus with diabetic neuropathy, unspecified: Secondary | ICD-10-CM

## 2020-04-12 DIAGNOSIS — E538 Deficiency of other specified B group vitamins: Secondary | ICD-10-CM

## 2020-04-12 MED ORDER — GABAPENTIN 100 MG PO CAPS: 100.0000 mg | ORAL_CAPSULE | Freq: Two times a day (BID) | ORAL | 3 refills | Status: DC | PRN

## 2020-04-12 NOTE — Assessment & Plan Note (Signed)
Sounds improved based on cbg log he brings - will continue to monitor off insulin, only on metformin. Reassess control with A1c at physical.

## 2020-04-12 NOTE — Assessment & Plan Note (Signed)
Chronic, stable on amlodipine and toprol XL 

## 2020-04-12 NOTE — Patient Instructions (Addendum)
Ok to get covid vaccine - schedule at your convenience.  Start gabapentin for nerve pain - take 100mg  capsule 1-2 tablets up to twice daily, update [place] with effect.  Return as needed or in 3 months for physical.

## 2020-04-12 NOTE — Assessment & Plan Note (Addendum)
Levels repleted - continue oral replacement (b complex vitamin).

## 2020-04-12 NOTE — Progress Notes (Signed)
This visit was conducted in person.  BP 128/76 (BP Location: Left Arm, Patient Position: Sitting, Cuff Size: Large)   Pulse (!) 54   Temp 97.6 F (36.4 C)   Ht 5' 10" (1.778 m)   Wt 228 lb (103.4 kg)   SpO2 94%   BMI 32.71 kg/m    CC: COVI19 infection f/u visit  Subjective:    Patient ID: Devin Huffman, male    DOB: November 18, 1946, 74 y.o.   MRN: [id number]  HPI: Devin Huffman is a 74 y.o. male presenting on 04/12/2020 for 2 month Pneumonia/Covid F/U (Pt states he feels pretty good. Still fatigues easily.)   See prior notes for details.  COVID19 infection early 01/2020 s/p mAb treatment then hospitalization with COVID PNA s/p decardon/remdesivir. Due to worsening dyspnea and CXR with worsening interstitial opacities, treated with omnicef 10d course + prednisone and albuterol with benefit. Was able to wean off home oxygen.   Since covid - ongoing fatigue, lack of motivation.. Metallic taste has improved. Cough, dyspnea largely resolved. Wife also staying fatigued post COVID.   DM - insulin started during hospitalization due to hyperglycemia (steroid use) - now off steroids and off insulin. We increased metformin to 500/1000mg daily. Brings log of fasting sugars showing 130s. Endorses neuropathic pain with loss of sensation to soles for years. No alcohol use. Vit B12 normal. Eye exam - yearly Lab Results  Component Value Date   HGBA1C 9.0 (H) 01/25/2020    Diabetic Foot Exam - Simple   Simple Foot Form Diabetic Foot exam was performed with the following findings: Yes 04/12/2020  9:25 AM  Visual Inspection See comments: Yes Sensation Testing See comments: Yes Pulse Check Posterior Tibialis and Dorsalis pulse intact bilaterally: Yes Comments Dry skin to soles Diminished sensation to monofilament at soles      Asks about COVID vaccine.      Relevant past medical, surgical, family and social history reviewed and updated as indicated. Interim medical history since our last visit  reviewed. Allergies and medications reviewed and updated. Outpatient Medications Prior to Visit  Medication Sig Dispense Refill  . amLODipine (NORVASC) 5 MG tablet Take 1 tablet (5 mg total) by mouth at bedtime. 90 tablet 3  . aspirin EC 81 MG tablet Take 81 mg by mouth every evening.     . B Complex-Folic Acid (B COMPLEX-VITAMIN B12 PO) Take 1 tablet by mouth every evening.     . blood glucose meter kit and supplies KIT Dispense based on patient and insurance preference. Use up to four times daily as directed. (FOR ICD-9 250.00, 250.01). 1 each 0  . Cholecalciferol (VITAMIN D3) 20 MCG TABS Take 25 mcg by mouth every evening.     . Coenzyme Q10 50 MG CAPS Take 1 capsule (50 mg total) by mouth daily. (Patient taking differently: Take 1 capsule by mouth every evening. )  0  . glucose blood (RELION TRUE METRIX TEST STRIPS) test strip Use as instructed to check blood sugar 2 times a day 100 each 0  . indomethacin (INDOCIN) 50 MG capsule TAKE 1 CAPSULE BY MOUTH THREE TIMES DAILY AS NEEDED (Patient taking differently: Take 50 mg by mouth 3 (three) times daily as needed for moderate pain. TAKE 1 CAPSULE BY MOUTH THREE TIMES DAILY AS NEEDED.) 30 capsule 0  . Insulin Pen Needle (PEN NEEDLES 3/16") 31G X 5 MM MISC 1 Container by Does not apply route 2 (two) times daily. 1 each 0  . metFORMIN (GLUCOPHAGE)  500 MG tablet Take 1 tablet (500 mg total) by mouth daily with breakfast AND 2 tablets (1,000 mg total) daily with supper. 270 tablet 1  . metoprolol succinate (TOPROL-XL) 25 MG 24 hr tablet Take 1 tablet (25 mg total) by mouth at bedtime. 90 tablet 3  . Omega-3 Fatty Acids (FISH OIL) 1000 MG CAPS Take 1 capsule (1,000 mg total) by mouth 2 (two) times a day. (Patient taking differently: Take 1,000 mg by mouth every evening. )  0  . omeprazole (PRILOSEC) 20 MG capsule TAKE ONE CAPSULE BY MOUTH ONCE DAILY AS NEEDED (Patient taking differently: Take 20 mg by mouth daily as needed (indigestion). ) 90 capsule 0  .  rosuvastatin (CRESTOR) 5 MG tablet Take 1 tablet (5 mg total) by mouth daily. 90 tablet 3  . vitamin C (ASCORBIC ACID) 500 MG tablet Take 500 mg by mouth every evening.     . cefdinir (OMNICEF) 300 MG capsule Take 2 capsules (600 mg total) by mouth daily. 20 capsule 0  . albuterol (VENTOLIN HFA) 108 (90 Base) MCG/ACT inhaler 2 puff      No facility-administered medications prior to visit.     Per HPI unless specifically indicated in ROS section below Review of Systems Objective:  BP 128/76 (BP Location: Left Arm, Patient Position: Sitting, Cuff Size: Large)   Pulse (!) 54   Temp 97.6 F (36.4 C)   Ht 5' 10" (1.778 m)   Wt 228 lb (103.4 kg)   SpO2 94%   BMI 32.71 kg/m   Wt Readings from Last 3 Encounters:  04/12/20 228 lb (103.4 kg)  02/10/20 224 lb 7 oz (101.8 kg)  02/06/20 228 lb (103.4 kg)      Physical Exam Vitals and nursing note reviewed.  Constitutional:      General: He is not in acute distress.    Appearance: Normal appearance. He is well-developed. He is not ill-appearing.  HENT:     Head: Normocephalic and atraumatic.  Eyes:     General: No scleral icterus.    Extraocular Movements: Extraocular movements intact.     Conjunctiva/sclera: Conjunctivae normal.     Pupils: Pupils are equal, round, and reactive to light.     Comments: Cataracts present  Cardiovascular:     Rate and Rhythm: Normal rate and regular rhythm.     Pulses: Normal pulses.     Heart sounds: Normal heart sounds. No murmur.  Pulmonary:     Effort: Pulmonary effort is normal. No respiratory distress.     Breath sounds: Normal breath sounds. No wheezing, rhonchi or rales.  Musculoskeletal:     Cervical back: Normal range of motion and neck supple.     Right lower leg: No edema.     Left lower leg: No edema.     Comments: See HPI for foot exam if done  Lymphadenopathy:     Cervical: No cervical adenopathy.  Skin:    General: Skin is warm and dry.     Findings: No rash.  Neurological:      Mental Status: He is alert.       Results for orders placed or performed in visit on 02/24/20  CBC with Differential/Platelet  Result Value Ref Range   WBC 9.3 4.0 - 10.5 K/uL   RBC 4.31 4.22 - 5.81 Mil/uL   Hemoglobin 14.4 13.0 - 17.0 g/dL   HCT 42.8 39.0 - 52.0 %   MCV 99.3 78.0 - 100.0 fl   MCHC 33.7 30.0 -  36.0 g/dL   RDW 13.3 11.5 - 15.5 %   Platelets 344.0 150.0 - 400.0 K/uL   Neutrophils Relative % 58.3 43.0 - 77.0 %   Lymphocytes Relative 25.5 12.0 - 46.0 %   Monocytes Relative 12.1 (H) 3.0 - 12.0 %   Eosinophils Relative 3.9 0.0 - 5.0 %   Basophils Relative 0.2 0.0 - 3.0 %   Neutro Abs 5.4 1.4 - 7.7 K/uL   Lymphs Abs 2.4 0.7 - 4.0 K/uL   Monocytes Absolute 1.1 (H) 0.1 - 1.0 K/uL   Eosinophils Absolute 0.4 0.0 - 0.7 K/uL   Basophils Absolute 0.0 0.0 - 0.1 K/uL  Fructosamine  Result Value Ref Range   Fructosamine 219 205 - 285 umol/L  Vitamin B12  Result Value Ref Range   Vitamin B-12 511 211 - 911 pg/mL  Comprehensive metabolic panel  Result Value Ref Range   Sodium 140 135 - 145 mEq/L   Potassium 4.5 3.5 - 5.1 mEq/L   Chloride 103 96 - 112 mEq/L   CO2 27 19 - 32 mEq/L   Glucose, Bld 150 (H) 70 - 99 mg/dL   BUN 10 6 - 23 mg/dL   Creatinine, Ser 0.88 0.40 - 1.50 mg/dL   Total Bilirubin 0.8 0.2 - 1.2 mg/dL   Alkaline Phosphatase 67 39 - 117 U/L   AST 16 0 - 37 U/L   ALT 17 0 - 53 U/L   Total Protein 6.5 6.0 - 8.3 g/dL   Albumin 3.7 3.5 - 5.2 g/dL   GFR 84.35 >60.00 mL/min   Calcium 9.0 8.4 - 10.5 mg/dL   Assessment & Plan:  This visit occurred during the SARS-CoV-2 public health emergency.  Safety protocols were in place, including screening questions prior to the visit, additional usage of staff PPE, and extensive cleaning of exam room while observing appropriate contact time as indicated for disinfecting solutions.   Problem List Items Addressed This Visit    Vitamin B12 deficiency    Levels repleted - continue oral replacement (b complex vitamin).        Type 2 diabetes mellitus with other specified complication ([place]) - Primary    Sounds improved based on cbg log he brings - will continue to monitor off insulin, only on metformin. Reassess control with A1c at physical.       Type 2 diabetes mellitus with diabetic neuropathy, unspecified ([place])    Continues oral B12 replacement.  Anticipate symptoms diabetes related. Will start gabapentin 133m 1-2 cap BID prn neuropathic pain. Monitor for side effects. Update with effect       Essential hypertension    Chronic, stable on amlodipine and toprol XL.           Meds ordered this encounter  Medications  . gabapentin (NEURONTIN) 100 MG capsule    Sig: Take 1-2 capsules (100-200 mg total) by mouth 2 (two) times daily as needed (neuropathy).    Dispense:  90 capsule    Refill:  3   No orders of the defined types were placed in this encounter.   Patient Instructions  Ok to get covid vaccine - schedule at your convenience.  Start gabapentin for nerve pain - take 1020mcapsule 1-2 tablets up to twice daily, update [place] effect.  Return as needed or in 3 months for physical.    Follow up plan: Return in about 3 months (around 07/13/2020), or if symptoms worsen or fail to improve, for annual exam, prior fasting for blood work.  Ria Bush, MD

## 2020-04-12 NOTE — Assessment & Plan Note (Signed)
Continues oral B12 replacement.  Anticipate symptoms diabetes related. Will start gabapentin 100mg  1-2 cap BID prn neuropathic pain. Monitor for side effects. Update with effect

## 2020-04-16 ENCOUNTER — Ambulatory Visit: Payer: Self-pay

## 2020-04-19 ENCOUNTER — Ambulatory Visit: Payer: Medicare HMO | Attending: Internal Medicine

## 2020-04-19 DIAGNOSIS — Z23 Encounter for immunization: Secondary | ICD-10-CM

## 2020-04-19 NOTE — Progress Notes (Signed)
   Covid-19 Vaccination Clinic  Name:  Devin Huffman    MRN: [id number] DOB: December 22, 1945  04/19/2020  [name] was observed post Covid-19 immunization for 15 minutes without incident. He was provided with Vaccine Information Sheet and instruction to access the V-Safe system.   [name] was instructed to call 911 with any severe reactions post vaccine: Marland Kitchen Difficulty breathing  . Swelling of face and throat  . A fast heartbeat  . A bad rash all over body  . Dizziness and weakness   Immunizations Administered    Name Date Dose VIS Date Route   Pfizer COVID-19 Vaccine 04/19/2020 11:03 AM 0.3 mL 02/04/2019 Intramuscular   Manufacturer: [place]   Lot: [id number]   [place]: [id number]

## 2020-04-26 ENCOUNTER — Telehealth: Payer: Self-pay | Admitting: Family Medicine

## 2020-04-26 NOTE — Progress Notes (Signed)
  Chronic Care Management   Outreach Note  04/26/2020 Name: KATON MARALDO MRN: [id number] DOB: 1946/07/21  Referred by: Ria Bush, MD Reason for referral : No chief complaint on file.   An unsuccessful telephone outreach was attempted today. The patient was referred to the pharmacist for assistance with care management and care coordination.   This note is not being shared with the patient for the following reason: To respect privacy (The patient or proxy has requested that the information not be shared).  Follow Up Plan:   Earney [place] Upstream Scheduler

## 2020-04-26 NOTE — Progress Notes (Signed)
  Chronic Care Management   Note  04/26/2020 Name: RAIF SARAFIN MRN: [id number] DOB: 11-17-1946  ONAJE ANGON is a 74 y.o. year old male who is a primary care patient of Ria Bush, MD. I reached out to [name] by phone today in response to a referral sent by Mr. DWAYNE KLEINERT PCP, Ria Bush, MD.   Mr. Combee was given information about Chronic Care Management services today including:  1. CCM service includes personalized support from designated clinical staff supervised by his physician, including individualized plan of care and coordination with other care providers 2. 24/7 contact phone numbers for assistance for urgent and routine care needs. 3. Service will only be billed when office clinical staff spend 20 minutes or more in a month to coordinate care. 4. Only one practitioner may furnish and bill the service in a calendar month. 5. The patient may stop CCM services at any time (effective at the end of the month) by phone call to the office staff.   Patient agreed to services and verbal consent obtained.   This note is not being shared with the patient for the following reason: To respect privacy (The patient or proxy has requested that the information not be shared). Follow up plan:   Earney Hamburg Upstream Scheduler

## 2020-04-29 DIAGNOSIS — L821 Other seborrheic keratosis: Secondary | ICD-10-CM | POA: Diagnosis not present

## 2020-04-29 DIAGNOSIS — L570 Actinic keratosis: Secondary | ICD-10-CM | POA: Diagnosis not present

## 2020-04-29 DIAGNOSIS — L820 Inflamed seborrheic keratosis: Secondary | ICD-10-CM | POA: Diagnosis not present

## 2020-04-29 DIAGNOSIS — L578 Other skin changes due to chronic exposure to nonionizing radiation: Secondary | ICD-10-CM | POA: Diagnosis not present

## 2020-05-11 ENCOUNTER — Ambulatory Visit: Payer: Medicare HMO | Attending: Internal Medicine

## 2020-05-11 DIAGNOSIS — Z23 Encounter for immunization: Secondary | ICD-10-CM

## 2020-05-11 NOTE — Progress Notes (Signed)
   Covid-19 Vaccination Clinic  Name:  PATTRICK RAMIRES    MRN: [id number] DOB: 09/04/1946  05/11/2020  Mr. Weingartz was observed post Covid-19 immunization for 30 minutes based on pre-vaccination screening without incident. He was provided with Vaccine Information Sheet and instruction to access the V-Safe system.   Mr. Vonasek was instructed to call 911 with any severe reactions post vaccine: Marland Kitchen Difficulty breathing  . Swelling of face and throat  . A fast heartbeat  . A bad rash all over body  . Dizziness and weakness   Immunizations Administered    Name Date Dose VIS Date Route   Pfizer COVID-19 Vaccine 05/11/2020  9:40 AM 0.3 mL 02/04/2019 Intramuscular   Manufacturer: [place]   Lot: [id number]   [place]: [facility]  Name:  KEANTHONY RUPP    MRN: [id number] DOB: [birth year]  05/11/2020  Mr. Mcmullen was observed post Covid-19 immunization for 30 minutes based on pre-vaccination screening without incident. He was provided with Vaccine Information Sheet and instruction to access the V-Safe system.   Mr. Going was instructed to call 911 with any severe reactions post vaccine: Marland Kitchen Difficulty breathing  . Swelling of face and throat  . A fast heartbeat  . A bad rash all over body  . Dizziness and weakness   Immunizations Administered    Name Date Dose VIS Date Route   Pfizer COVID-19 Vaccine 05/11/2020  9:40 AM 0.3 mL 02/04/2019 Intramuscular   Manufacturer: [employer]   Lot: [id number]   [place]: [id number]

## 2020-05-31 ENCOUNTER — Telehealth: Payer: Self-pay

## 2020-05-31 NOTE — Telephone Encounter (Signed)
Patient's wife contacted the office and states that the patient was prescribed Gabapentin for his neuropathy. She states this is not helping the patient at all. They are wondering if this is a medication he can just stop all together? And if Dr. Darnell Level has any other recommendations?

## 2020-05-31 NOTE — Telephone Encounter (Signed)
Spoke with pt relaying Dr. Synthia Innocent message.  Pt verbalizes understanding and states as of yet he has not noticed any change in the burning in his feet.

## 2020-05-31 NOTE — Telephone Encounter (Addendum)
We are on a very low dose gabapentin and have room to increase - it may not be effective yet due to low dose. Would suggest increase to 300mg  at night time. Has he noticed any difference to burning foot pain?   Alternatively we're on low enough dose should be ok to just stop. But I would suggest titrating up. Is it sedating? If not could increase to twice daily dosing, assuming tolerates 300mg  at night time first.

## 2020-06-17 ENCOUNTER — Telehealth: Payer: Self-pay

## 2020-06-17 ENCOUNTER — Ambulatory Visit: Payer: Medicare HMO

## 2020-06-17 ENCOUNTER — Other Ambulatory Visit: Payer: Self-pay

## 2020-06-17 DIAGNOSIS — E1140 Type 2 diabetes mellitus with diabetic neuropathy, unspecified: Secondary | ICD-10-CM

## 2020-06-17 DIAGNOSIS — E1169 Type 2 diabetes mellitus with other specified complication: Secondary | ICD-10-CM

## 2020-06-17 NOTE — Chronic Care Management (AMB) (Signed)
Chronic Care Management Pharmacy  Name: Devin Huffman  MRN: [id number] DOB: 1946/09/10  Chief Complaint/ HPI  Devin Huffman,  74 y.o. , male presents for their Initial CCM visit with the clinical pharmacist via telephone.  PCP : Ria Bush, MD  Their chronic conditions include: HTN, PVD, GERD, DLD, type 2 DM. OA, gout, vitamin B12 deficiency  Office Visits:  05/31/20: telephone advice - pt instructed to increase gabapentin to 300 mg qhs, then BID if tolerated without sedation  04/12/20: PCP visit - ongoing fatigue, lack of motivation since COVID; DM, insulin started while in hospital due to hyperglycemia with steroid use, no longer on steroids or insulin, increased metformin, fasting BG 130s; neuropathy,start gabapentin 174m 1-2 cap BID prn neuropathic pain, rtc 3 months  03/03/20: Lab results - increase metformin to 1 AM, 2 PM, hold insulin   Consult Visit:  01/22/20: ED to admit - COVID-19  Medications: Outpatient Encounter Medications as of 06/17/2020  Medication Sig  . amLODipine (NORVASC) 5 MG tablet Take 1 tablet (5 mg total) by mouth at bedtime.  . Ascorbic Acid (VITAMIN C) 1000 MG tablet Take 1,000 mg by mouth every evening.   .Marland Kitchenaspirin EC 81 MG tablet Take 81 mg by mouth every evening.   . blood glucose meter kit and supplies KIT Dispense based on patient and insurance preference. Use up to four times daily as directed. (FOR ICD-9 250.00, 250.01).  . Cholecalciferol (VITAMIN D3) 50 MCG (2000 UT) capsule Take 50 mcg by mouth every evening.   . Coenzyme Q10 50 MG CAPS Take 1 capsule (50 mg total) by mouth daily.  .Marland Kitchengabapentin (NEURONTIN) 100 MG capsule Take 1-2 capsules (100-200 mg total) by mouth 2 (two) times daily as needed (neuropathy). (Patient taking differently: Take 300 mg by mouth 2 (two) times daily. )  . glucose blood (RELION TRUE METRIX TEST STRIPS) test strip Use as instructed to check blood sugar 2 times a day  . indomethacin (INDOCIN) 50 MG capsule TAKE  1 CAPSULE BY MOUTH THREE TIMES DAILY AS NEEDED (Patient taking differently: Take 50 mg by mouth 3 (three) times daily as needed for moderate pain. TAKE 1 CAPSULE BY MOUTH THREE TIMES DAILY AS NEEDED.)  . metFORMIN (GLUCOPHAGE) 500 MG tablet Take 1 tablet (500 mg total) by mouth daily with breakfast AND 2 tablets (1,000 mg total) daily with supper.  . metoprolol succinate (TOPROL-XL) 25 MG 24 hr tablet Take 1 tablet (25 mg total) by mouth at bedtime.  . Omega-3 Fatty Acids (FISH OIL) 1000 MG CAPS Take 1 capsule (1,000 mg total) by mouth 2 (two) times a day.  .Marland Kitchenomeprazole (PRILOSEC) 20 MG capsule TAKE ONE CAPSULE BY MOUTH ONCE DAILY AS NEEDED  . rosuvastatin (CRESTOR) 5 MG tablet Take 1 tablet (5 mg total) by mouth daily.  . vitamin B-12 (CYANOCOBALAMIN) 1000 MCG tablet Take 1,000 mcg by mouth daily.  . B Complex-Folic Acid (B COMPLEX-VITAMIN B12 PO) Take 1 tablet by mouth every evening.   . Insulin Pen Needle (PEN NEEDLES 3/16") 31G X 5 MM MISC 1 Container by Does not apply route 2 (two) times daily.  . [DISCONTINUED] lisinopril (PRINIVIL,ZESTRIL) 5 MG tablet Take 1 tablet (5 mg total) by mouth daily.   No facility-administered encounter medications on file as of 06/17/2020.   Current Diagnosis/Assessment:  SDOH Interventions     Most Recent Value  SDOH Interventions  Financial Strain Interventions Intervention Not Indicated     Goals Addressed  This Visit's Progress   . Pharmacy Care Plan       CARE PLAN ENTRY (see longitudinal plan of care for additional care plan information)  Current Barriers:  . Chronic Disease Management support, education, and care coordination needs related to Diabetes and Neuropathy  Diabetes Lab Results  Component Value Date/Time   HGBA1C 9.0 (H) 01/25/2020 08:37 AM   HGBA1C 7.3 (H) 07/04/2019 09:07 AM  A1c per fructosamine 02/24/20: 5.3% . Pharmacist Clinical Goal(s): o Over the next 6 months, patient will work with PharmD and providers to  maintain A1c goal <7% . Current regimen:  o Metformin 500 mg - 1 tablet with breakfast and 2 tablets with supper . Interventions: o Re-evaluate need for metformin with labs in August  . Patient self care activities - Over the next 6 months, patient will: o Check blood sugar with symptoms of hypoglycemia and daily for 7 days prior to appointment with Dr. Danise Mina in August, document, and provide at future appointments o Contact provider with any episodes of hypoglycemia (shakiness, sweatiness, hunger, fatigue)  Nerve Pain . Pharmacist Clinical Goal(s) o Over the next 6 months, patient will work with PharmD and providers to improve nerve pain . Current regimen:  o Gabapentin 100 mg - 3 capsules twice daily . Interventions: o Consult PCP to increase gabapentin dosing  . Patient self care activities - Over the next 6 months, patient will: o Continue current medications until further contact from clinic   Medication management . Pharmacist Clinical Goal(s): o Over the next 6 months, patient will work with PharmD and providers to maintain optimal medication adherence . [facility] . Interventions o Comprehensive medication review performed. o Utilize [facility] for medication synchronization, packaging and delivery . Patient self care activities - Over the next 6 months, patient will: o Proofreader with any medication changes, questions, or refills needed  Initial goal documentation      Hypertension   CMP Latest Ref Rng & Units 02/24/2020 01/27/2020 01/26/2020  Glucose 70 - 99 mg/dL 150(H) 201(H) 259(H)  BUN 6 - 23 mg/dL 10 38(H) 39(H)  Creatinine 0.40 - 1.50 mg/dL 0.88 0.86 0.93  Sodium 135 - 145 mEq/L 140 139 141  Potassium 3.5 - 5.1 mEq/L 4.5 4.3 4.4  Chloride 96 - 112 mEq/L 103 104 106  CO2 19 - 32 mEq/L _0 Calcium 8.4 - 10.5 mg/dL 9.0 9.2 9.3  Total Protein 6.0 - 8.3 g/dL 6.5 6.5 6.8  Total Bilirubin 0.2 - 1.2 mg/dL 0.8 0.6 0.6    Alkaline Phos 39 - 117 U/L 67 66 75  AST 0 - 37 U/L 16 13(L) 20  ALT 0 - 53 U/L 17 28 32   Office blood pressures are: BP Readings from Last 3 Encounters:  04/12/20 128/76  02/10/20 120/70  02/06/20 (!) 168/84   Patient has failed these meds in the past: none  Patient checks BP at home when feeling symptomatic (has home monitor) Patient home BP readings are ranging: none reported   BP goal < 130/80 mmHg Patient is currently controlled on the following medications:   Amlodipine 5 mg - 1 tablet daily  Metoprolol succinate 25 mg - 1 tablet qhs  Adherence: uses pillbox each week, denies missed doses 9 days out of 10 Takes all meds at night, except 1 fish oil, 3 gabapentin, and 1 metformin in the morning   Plan: Continue current medications  Hyperlipidemia   Lipid Panel  Component Value Date/Time   CHOL 129 07/04/2019 0907   TRIG 231 (H) 01/22/2020 1602   HDL 41.80 07/04/2019 0907   LDLDIRECT 63.0 07/04/2019 0907     LDL goal < 100  Patient has failed these meds in past: none reported Patient is currently controlled on the following medications:  . Aspirin 81 mg - 1 tablet daily . Omega 3 fatty acids 1000 mg - 1 capsule BID . Rosuvastatin 5 mg - 1 tablet daily  We discussed: TG remain mildly elevated despite increase in fish oil to BID fall 2020  Plan: Continue current medications  Diabetes   Recent Relevant Labs: Lab Results  Component Value Date/Time   HGBA1C 9.0 (H) 01/25/2020 08:37 AM   HGBA1C 7.3 (H) 07/04/2019 09:07 AM   MICROALBUR 2.3 (H) 07/04/2019 09:07 AM   MICROALBUR <0.7 05/27/2018 08:47 AM   Fructosamine 02/24/20 equivalent to A1c of 5.3%  Checking BG: Weekly Recent FBG Readings: 150 or less in the morning   Patient has failed these meds in past: Tradjenta, Insulin  Patient is currently controlled on the following medications:   Metformin 500 mg - 1 tablet with breakfast and 2 tablets with supper  Last diabetic eye exam:  Lab Results   Component Value Date/Time   HMDIABEYEEXA No Retinopathy 09/03/2019 12:00 AM    Last diabetic foot exam:  PCP completed 04/12/20, diminished sensation to monofilament on soles  We discussed: Pt did not have elevated glucose until COVID hospitalization 01/2019, on steroids and insulin. Insulin d/c at discharge and started on metformin and Tradjenta. Pt did not Huffman up Tradjenta due to cost. Metformin was increased to current dose 03/05/20. Pt was checking BG daily at first but is now checking infrequently as he reports BG was always in range in the morning (< 150). He would like to come off of metformin, but we do not have adequate readings to recommend d/c at this time. Evaluate with next A1c.   Plan: Continue current medications  GERD   Patient has failed these meds in past: none Patient is currently controlled on the following medications:   Omeprazole 20 mg OTC - 1 tablet daily before breakfast as needed  Adherence: Has not taken omeprazole recently, usually takes Tums first and has relief.  Plan: Continue current medications  Diabetic Neuropathy   Patient has failed these meds in past: none  Patient is currently uncontrolled on the following medications:   Gabapentin 100 mg - 3 capsules BID    We discussed: Started 04/12/20, dose increased to 300 mg qhs up to BID 05/31/20. Denies noticeable change in tingling in feet, described as hot/burning sensation. Denies sedation or drowsiness with gabapentin.  Plan: Continue current medications. Consult with Dr. Darnell Level for dose increase.   Gout   Kidney Function Lab Results  Component Value Date/Time   CREATININE 0.88 02/24/2020 08:10 AM   CREATININE 0.86 01/27/2020 03:55 AM   GFR 84.35 02/24/2020 08:10 AM   GFRNONAA >60 01/27/2020 03:55 AM   GFRAA >60 01/27/2020 03:55 AM   K 4.5 02/24/2020 08:10 AM   K 4.3 01/27/2020 03:55 AM   Patient has failed these meds in past: none  Patient is currently controlled on the following medications:    Indomethacin 50 mg - 1 capsule TID PRN   We discussed: denies use in past few months, denies recent gout flares   Plan: Continue current medications   Misc/OTCs   Patient is currently on the following OTC/misc. medications:   Vitamin B-12  1000 mcg - 1 tablet daily  Vitamin D3 50 mcg - 1 daily   CoQ10 50 mg - 1 daily  Vitamin C 1000 mg - 1 daily   We discussed: B12 WNL 02/24/20, no vitamin D level available  Plan: Continue current medications  Medication Management   [facility] but has had some difficulty getting certain refills Cost concerns: none, uses [facility] with new medications; orders OTCs through [facility] preferred, vials   We discussed: offered medication coordination, sync, delivery and packaging and patient would like to switch to [facility] for pharmacy coordination through CCM pharmacist   Verbal consent obtained for [facility] enhanced pharmacy services (medication synchronization, adherence packaging, delivery coordination). A medication sync plan was created to allow patient to get all medications delivered once every 30 to 90 days per patient preference. Patient understands they have freedom to choose pharmacy and clinical pharmacist will coordinate care between all prescribers and [facility].  Plan: Begin pharmacy coordination services through [facility]  CCM Follow Up: 6 months, telephone   Debbora Dus, PharmD Clinical Pharmacist [facility] at [facility] [phone]

## 2020-06-17 NOTE — Patient Instructions (Addendum)
Dear Devin Huffman,  It was a pleasure meeting you during our initial appointment on June 17, 2020. Below is a summary of the goals we discussed and components of chronic care management. Please contact me anytime with questions or concerns.   Visit Information  Goals Addressed            This Visit's Progress   . Pharmacy Care Plan       CARE PLAN ENTRY (see longitudinal plan of care for additional care plan information)  Current Barriers:  . Chronic Disease Management support, education, and care coordination needs related to Diabetes and Neuropathy  Diabetes Lab Results  Component Value Date/Time   HGBA1C 9.0 (H) 01/25/2020 08:37 AM   HGBA1C 7.3 (H) 07/04/2019 09:07 AM  A1c per fructosamine 02/24/20: 5.3% . Pharmacist Clinical Goal(s): o Over the next 6 months, patient will work with PharmD and providers to maintain A1c goal <7% . Current regimen:  o Metformin 500 mg - 1 tablet with breakfast and 2 tablets with supper . Interventions: o Re-evaluate need for metformin with labs in August  . Patient self care activities - Over the next 6 months, patient will: o Check blood sugar with symptoms of hypoglycemia and daily for 7 days prior to appointment with Dr. Danise Mina in August, document, and provide at future appointments o Contact provider with any episodes of hypoglycemia (shakiness, sweatiness, hunger, fatigue)  Nerve Pain . Pharmacist Clinical Goal(s) o Over the next 6 months, patient will work with PharmD and providers to improve nerve pain . Current regimen:  o Gabapentin 100 mg - 3 capsules twice daily . Interventions: o Consult PCP to increase gabapentin dosing  . Patient self care activities - Over the next 6 months, patient will: o Continue current medications until further contact from clinic   Medication management . Pharmacist Clinical Goal(s): o Over the next 6 months, patient will work with PharmD and providers to maintain optimal medication  adherence . [facility] . Interventions o Comprehensive medication review performed. o Utilize [facility] for medication synchronization, packaging and delivery . Patient self care activities - Over the next 6 months, patient will: o Proofreader with any medication changes, questions, or refills needed  Initial goal documentation      Devin Huffman was given information about Chronic Care Management services today including:  1. CCM service includes personalized support from designated clinical staff supervised by his physician, including individualized plan of care and coordination with other care providers 2. 24/7 contact phone numbers for assistance for urgent and routine care needs. 3. Standard insurance, coinsurance, copays and deductibles apply for chronic care management only during months in which we provide at least 20 minutes of these services. Most insurances cover these services at 100%, however patients may be responsible for any copay, coinsurance and/or deductible if applicable. This service may help you avoid the need for more expensive face-to-face services. 4. Only one practitioner may furnish and bill the service in a calendar month. 5. The patient may stop CCM services at any time (effective at the end of the month) by phone call to the office staff.  Patient agreed to services and verbal consent obtained.   Verbal consent obtained for [facility] enhanced pharmacy services (medication synchronization, adherence packaging, delivery coordination). A medication sync plan was created to allow patient to get all medications delivered once every 30 to 90 days per patient preference. Patient understands they have freedom to choose pharmacy and clinical  pharmacist will coordinate care between all prescribers and [facility].  The patient verbalized understanding of instructions provided today and agreed to receive a mailed copy of  patient instruction and/or educational materials. Telephone follow up appointment with pharmacy team member scheduled for: January, 6, 2022 at 9 AM (telephone)  Debbora Dus, PharmD Clinical Pharmacist [facility] at [facility] [phone]  Neuropathic Pain Neuropathic pain is pain caused by damage to the nerves that are responsible for certain sensations in your body (sensory nerves). The pain can be caused by: Damage to the sensory nerves that send signals to your spinal cord and brain (peripheral nervous system). Damage to the sensory nerves in your brain or spinal cord (central nervous system). Neuropathic pain can make you more sensitive to pain. Even a minor sensation can feel very painful. This is usually a long-term condition that can be difficult to treat. The type of pain differs from person to person. It may: Start suddenly (acute), or it may develop slowly and last for a long time (chronic). Come and go as damaged nerves heal, or it may stay at the same level for years. Cause emotional distress, loss of sleep, and a lower quality of life. What are the causes? The most common cause of this condition is diabetes. Many other diseases and conditions can also cause neuropathic pain. Causes of neuropathic pain can be classified as: Toxic. This is caused by medicines and chemicals. The most common cause of toxic neuropathic pain is damage from cancer treatments (chemotherapy). Metabolic. This can be caused by: Diabetes. This is the most common disease that damages the nerves. Lack of vitamin B from long-term alcohol abuse. Traumatic. Any injury that cuts, crushes, or stretches a nerve can cause damage and pain. A common example is feeling pain after losing an arm or leg (phantom limb pain). Compression-related. If a sensory nerve gets trapped or compressed for a long period of time, the blood supply to the nerve can be cut off. Vascular. Many blood vessel diseases can cause  neuropathic pain by decreasing blood supply and oxygen to nerves. Autoimmune. This type of pain results from diseases in which the body's defense system (immune system) mistakenly attacks sensory nerves. Examples of autoimmune diseases that can cause neuropathic pain include lupus and multiple sclerosis. Infectious. Many types of viral infections can damage sensory nerves and cause pain. Shingles infection is a common cause of this type of pain. Inherited. Neuropathic pain can be a symptom of many diseases that are passed down through families (genetic). What increases the risk? You are more likely to develop this condition if: You have diabetes. You smoke. You drink too much alcohol. You are taking certain medicines, including medicines that kill cancer cells (chemotherapy) or that treat immune system disorders. What are the signs or symptoms? The main symptom is pain. Neuropathic pain is often described as: Burning. Shock-like. Stinging. Hot or cold. Itching. How is this diagnosed? No single test can diagnose neuropathic pain. It is diagnosed based on: Physical exam and your symptoms. Your health care provider will ask you about your pain. You may be asked to use a pain scale to describe how bad your pain is. Tests. These may be done to see if you have a high sensitivity to pain and to help find the cause and location of any sensory nerve damage. They include: Nerve conduction studies to test how well nerve signals travel through your sensory nerves (electrodiagnostic testing). Stimulating your sensory nerves through electrodes on your  skin and measuring the response in your spinal cord and brain (somatosensory evoked potential). Imaging studies, such as: X-rays. CT scan. MRI. How is this treated? Treatment for neuropathic pain may change over time. You may need to try different treatment options or a combination of treatments. Some options include: Treating the underlying cause of the  neuropathy, such as diabetes, kidney disease, or vitamin deficiencies. Stopping medicines that can cause neuropathy, such as chemotherapy. Medicine to relieve pain. Medicines may include: Prescription or over-the-counter pain medicine. Anti-seizure medicine. Antidepressant medicines. Pain-relieving patches that are applied to painful areas of skin. A medicine to numb the area (local anesthetic), which can be injected as a nerve block. Transcutaneous nerve stimulation. This uses electrical currents to block painful nerve signals. The treatment is painless. Alternative treatments, such as: Acupuncture. Meditation. Massage. Physical therapy. Pain management programs. Counseling. Follow these instructions at home: Medicines  Take over-the-counter and prescription medicines only as told by your health care provider. Do not drive or use heavy machinery while taking prescription pain medicine. If you are taking prescription pain medicine, take actions to prevent or treat constipation. Your health care provider may recommend that you: Drink enough fluid to keep your urine pale yellow. Eat foods that are high in fiber, such as fresh fruits and vegetables, whole grains, and beans. Limit foods that are high in fat and processed sugars, such as fried or sweet foods. Take an over-the-counter or prescription medicine for constipation. Lifestyle  Have a good support system at home. Consider joining a chronic pain support group. Do not use any products that contain nicotine or tobacco, such as cigarettes and e-cigarettes. If you need help quitting, ask your health care provider. Do not drink alcohol. General instructions Learn as much as you can about your condition. Work closely with all your health care providers to find the treatment plan that works best for you. Ask your health care provider what activities are safe for you. Keep all follow-up visits as told by your health care provider. This  is important. Contact a health care provider if: Your pain treatments are not working. You are having side effects from your medicines. You are struggling with tiredness (fatigue), mood changes, depression, or anxiety. Summary Neuropathic pain is pain caused by damage to the nerves that are responsible for certain sensations in your body (sensory nerves). Neuropathic pain may come and go as damaged nerves heal, or it may stay at the same level for years. Neuropathic pain is usually a long-term condition that can be difficult to treat. Consider joining a chronic pain support group. This information is not intended to replace advice given to you by your health care provider. Make sure you discuss any questions you have with your health care provider. Document Revised: 03/20/2019 Document Reviewed: 12/14/2017 Elsevier Patient Education  [place].

## 2020-06-17 NOTE — Telephone Encounter (Signed)
PCP consult regarding CCM visit 06/17/20:  Patient reports increasing gabapentin to 300 mg twice daily (morning and bedtime) as instructed on 05/31/20. Denies drowsiness or sedation. Reports no relief or improvement in burning/tingling of feet.  We discussed trial of higher dose prior to changing therapy. I usually recommend increasing to 300 mg TID with titration by 300 mg every 3-5 days as needed up to 600 mg TID. Patient has f/u PCP visit 07/12/20.  Debbora Dus, PharmD Clinical Pharmacist [facility] at [facility] [phone]

## 2020-06-18 MED ORDER — GABAPENTIN 300 MG PO CAPS: 300.0000 mg | ORAL_CAPSULE | Freq: Three times a day (TID) | ORAL | 1 refills | Status: DC | PRN

## 2020-06-18 NOTE — Telephone Encounter (Signed)
Agree. Let's bump him up to 300mg  BID for 4 days then if tolerated and needed, increase to TID.  300mg  dose sent to local pharmacy

## 2020-06-25 ENCOUNTER — Telehealth: Payer: Self-pay

## 2020-06-25 MED ORDER — ROSUVASTATIN CALCIUM 5 MG PO TABS: 5.0000 mg | ORAL_TABLET | Freq: Every day | ORAL | 0 refills | Status: DC

## 2020-06-25 MED ORDER — AMLODIPINE BESYLATE 5 MG PO TABS: 5.0000 mg | ORAL_TABLET | Freq: Every day | ORAL | 0 refills | Status: DC

## 2020-06-25 MED ORDER — METOPROLOL SUCCINATE ER 25 MG PO TB24: 25.0000 mg | ORAL_TABLET | Freq: Every day | ORAL | 0 refills | Status: DC

## 2020-06-25 MED ORDER — METFORMIN HCL 500 MG PO TABS: ORAL_TABLET | ORAL | 0 refills | Status: DC

## 2020-06-25 NOTE — Telephone Encounter (Signed)
Noted.  All requested refills sent to [facility].

## 2020-06-25 NOTE — Telephone Encounter (Signed)
Devin Huffman has requested to transfer his preferred pharmacy to [facility] for medication coordination, sync, and packaging. The following medication refills are needed: metoprolol succinate 25 mg, rosuvastatin 5 mg, amlodipine 5 mg, metformin 500 mg  Please send refills to [facility] in [place].  Thank you,  Debbora Dus, PharmD Clinical Pharmacist [facility] at [facility] [phone]

## 2020-06-29 NOTE — Progress Notes (Signed)
I have collaborated with the care management provider regarding care management and care coordination activities outlined in this encounter and have reviewed this encounter including documentation in the note and care plan. I am certifying that I agree with the content of this note and encounter as supervising physician.  

## 2020-07-04 ENCOUNTER — Other Ambulatory Visit: Payer: Self-pay | Admitting: Family Medicine

## 2020-07-04 DIAGNOSIS — E538 Deficiency of other specified B group vitamins: Secondary | ICD-10-CM

## 2020-07-04 DIAGNOSIS — E1169 Type 2 diabetes mellitus with other specified complication: Secondary | ICD-10-CM

## 2020-07-04 DIAGNOSIS — R972 Elevated prostate specific antigen [PSA]: Secondary | ICD-10-CM

## 2020-07-04 DIAGNOSIS — M1A0710 Idiopathic chronic gout, right ankle and foot, without tophus (tophi): Secondary | ICD-10-CM

## 2020-07-05 ENCOUNTER — Other Ambulatory Visit (INDEPENDENT_AMBULATORY_CARE_PROVIDER_SITE_OTHER): Payer: Medicare HMO

## 2020-07-05 ENCOUNTER — Other Ambulatory Visit: Payer: Self-pay

## 2020-07-05 DIAGNOSIS — M1A0710 Idiopathic chronic gout, right ankle and foot, without tophus (tophi): Secondary | ICD-10-CM | POA: Diagnosis not present

## 2020-07-05 DIAGNOSIS — E785 Hyperlipidemia, unspecified: Secondary | ICD-10-CM

## 2020-07-05 DIAGNOSIS — E538 Deficiency of other specified B group vitamins: Secondary | ICD-10-CM | POA: Diagnosis not present

## 2020-07-05 DIAGNOSIS — R972 Elevated prostate specific antigen [PSA]: Secondary | ICD-10-CM

## 2020-07-05 DIAGNOSIS — E1169 Type 2 diabetes mellitus with other specified complication: Secondary | ICD-10-CM | POA: Diagnosis not present

## 2020-07-05 LAB — LIPID PANEL
Cholesterol: 124 mg/dL (ref 0–200)
HDL: 43 mg/dL (ref >39.00)
LDL Cholesterol: 47 mg/dL (ref 0–99)
NonHDL: 81.17
Total CHOL/HDL Ratio: 3
Triglycerides: 172 mg/dL — ABNORMAL HIGH (ref 0.0–149.0)
VLDL: 34.4 mg/dL (ref 0.0–40.0)

## 2020-07-05 LAB — VITAMIN B12: Vitamin B-12: 415 pg/mL (ref 211–911)

## 2020-07-05 LAB — COMPREHENSIVE METABOLIC PANEL
ALT: 25 U/L (ref 0–53)
AST: 19 U/L (ref 0–37)
Albumin: 4.2 g/dL (ref 3.5–5.2)
Alkaline Phosphatase: 73 U/L (ref 39–117)
BUN: 17 mg/dL (ref 6–23)
CO2: 30 mEq/L (ref 19–32)
Calcium: 9.4 mg/dL (ref 8.4–10.5)
Chloride: 104 mEq/L (ref 96–112)
Creatinine, Ser: 0.97 mg/dL (ref 0.40–1.50)
GFR: 75.31 mL/min (ref >60.00)
Glucose, Bld: 155 mg/dL — ABNORMAL HIGH (ref 70–99)
Potassium: 4.9 mEq/L (ref 3.5–5.1)
Sodium: 141 mEq/L (ref 135–145)
Total Bilirubin: 0.6 mg/dL (ref 0.2–1.2)
Total Protein: 6.6 g/dL (ref 6.0–8.3)

## 2020-07-05 LAB — HEMOGLOBIN A1C: Hgb A1c MFr Bld: 7.2 % — ABNORMAL HIGH (ref 4.6–6.5)

## 2020-07-05 LAB — URIC ACID: Uric Acid, Serum: 8.5 mg/dL — ABNORMAL HIGH (ref 4.0–7.8)

## 2020-07-05 LAB — PSA: PSA: 1.12 ng/mL (ref 0.10–4.00)

## 2020-07-12 ENCOUNTER — Ambulatory Visit (INDEPENDENT_AMBULATORY_CARE_PROVIDER_SITE_OTHER): Payer: Medicare HMO | Admitting: Family Medicine

## 2020-07-12 ENCOUNTER — Encounter: Payer: Self-pay | Admitting: Family Medicine

## 2020-07-12 ENCOUNTER — Ambulatory Visit (INDEPENDENT_AMBULATORY_CARE_PROVIDER_SITE_OTHER): Payer: Medicare HMO

## 2020-07-12 VITALS — BP 126/76 | HR 67 | Temp 97.5°F | Ht 70.0 in | Wt 232.4 lb

## 2020-07-12 DIAGNOSIS — E1169 Type 2 diabetes mellitus with other specified complication: Secondary | ICD-10-CM | POA: Diagnosis not present

## 2020-07-12 DIAGNOSIS — Z0000 Encounter for general adult medical examination without abnormal findings: Secondary | ICD-10-CM

## 2020-07-12 DIAGNOSIS — E669 Obesity, unspecified: Secondary | ICD-10-CM | POA: Diagnosis not present

## 2020-07-12 DIAGNOSIS — E1136 Type 2 diabetes mellitus with diabetic cataract: Secondary | ICD-10-CM

## 2020-07-12 DIAGNOSIS — Z96641 Presence of right artificial hip joint: Secondary | ICD-10-CM

## 2020-07-12 DIAGNOSIS — E785 Hyperlipidemia, unspecified: Secondary | ICD-10-CM

## 2020-07-12 DIAGNOSIS — Z794 Long term (current) use of insulin: Secondary | ICD-10-CM | POA: Diagnosis not present

## 2020-07-12 DIAGNOSIS — Z7189 Other specified counseling: Secondary | ICD-10-CM

## 2020-07-12 DIAGNOSIS — I10 Essential (primary) hypertension: Secondary | ICD-10-CM

## 2020-07-12 DIAGNOSIS — E1140 Type 2 diabetes mellitus with diabetic neuropathy, unspecified: Secondary | ICD-10-CM | POA: Diagnosis not present

## 2020-07-12 DIAGNOSIS — M1A0710 Idiopathic chronic gout, right ankle and foot, without tophus (tophi): Secondary | ICD-10-CM | POA: Diagnosis not present

## 2020-07-12 DIAGNOSIS — I77810 Thoracic aortic ectasia: Secondary | ICD-10-CM | POA: Diagnosis not present

## 2020-07-12 DIAGNOSIS — E538 Deficiency of other specified B group vitamins: Secondary | ICD-10-CM

## 2020-07-12 DIAGNOSIS — Z23 Encounter for immunization: Secondary | ICD-10-CM

## 2020-07-12 HISTORY — DX: Type 2 diabetes mellitus with diabetic cataract: E11.36

## 2020-07-12 LAB — MICROALBUMIN / CREATININE URINE RATIO
Creatinine,U: 153.8 mg/dL
Microalb Creat Ratio: 0.7 mg/g (ref 0.0–30.0)
Microalb, Ur: 1 mg/dL (ref 0.0–1.9)

## 2020-07-12 MED ORDER — RELION TRUE METRIX TEST STRIPS VI STRP: ORAL_STRIP | 3 refills | Status: DC

## 2020-07-12 MED ORDER — RELION PEN NEEDLES 31G X 6 MM MISC: 3 refills | Status: DC

## 2020-07-12 NOTE — Assessment & Plan Note (Signed)
Chronic, stable on amlodipine and toprol XL

## 2020-07-12 NOTE — Assessment & Plan Note (Signed)
No recent gout flares. Will continue to monitor off urate lowering medication.

## 2020-07-12 NOTE — Assessment & Plan Note (Signed)
Levels repleted - continue oral replacement.

## 2020-07-12 NOTE — Progress Notes (Signed)
PCP notes:  Health Maintenance: Tdap- due   Abnormal Screenings: none   Patient concerns: Patient has a laceration on his leg. Wants to know if he can get a tetanus shot due to this today.    Nurse concerns: none   Next PCP appt.: Today @ 10:30 am

## 2020-07-12 NOTE — Assessment & Plan Note (Signed)
Improving however slightly above goal. He has been taking novolog 70/30 PRN intermittently for elevated sugars - advised to change to 10u daily with breakfast - anticipate this will help glycemic control.

## 2020-07-12 NOTE — Progress Notes (Signed)
Subjective:   Devin Huffman is a 74 y.o. male who presents for Medicare Annual/Subsequent preventive examination.  Review of Systems: N/A      I connected with the patient today by telephone and verified that I am speaking with the correct person using two identifiers. Location patient: home Location nurse: work Persons participating in the virtual visit: patient, Marine scientist.   I discussed the limitations, risks, security and privacy concerns of performing an evaluation and management service by telephone and the availability of in person appointments. I also discussed with the patient that there may be a patient responsible charge related to this service. The patient expressed understanding and verbally consented to this telephonic visit.    Interactive audio and video telecommunications were attempted between this nurse and patient, however failed, due to patient having technical difficulties OR patient did not have access to video capability.  We continued and completed visit with audio only.     Cardiac Risk Factors include: advanced age (>45mn, >>72women);diabetes mellitus;male gender     Objective:    Today's Vitals   There is no height or weight on file to calculate BMI.  Advanced Directives 07/12/2020 01/23/2020 01/22/2020 05/27/2018 05/22/2017 03/26/2017 03/07/2017  Does Patient Have a Medical Advance Directive? No - No No (No Data) No No  Does patient want to make changes to medical advance directive? - - - No - Patient declined - - -  Would patient like information on creating a medical advance directive? No - Patient declined No - Patient declined - - - No - Patient declined No - Patient declined  Pre-existing out of facility DNR order (yellow form or pink MOST form) - - - - - - -    Current Medications (verified) Outpatient Encounter Medications as of 07/12/2020  Medication Sig  . amLODipine (NORVASC) 5 MG tablet Take 1 tablet (5 mg total) by mouth at bedtime.  . Ascorbic Acid  (VITAMIN C) 1000 MG tablet Take 1,000 mg by mouth every evening.   .Marland Kitchenaspirin EC 81 MG tablet Take 81 mg by mouth every evening.   . B Complex-Folic Acid (B COMPLEX-VITAMIN B12 PO) Take 1 tablet by mouth every evening.   . blood glucose meter kit and supplies KIT Dispense based on patient and insurance preference. Use up to four times daily as directed. (FOR ICD-9 250.00, 250.01).  . Cholecalciferol (VITAMIN D3) 50 MCG (2000 UT) capsule Take 50 mcg by mouth every evening.   . Coenzyme Q10 50 MG CAPS Take 1 capsule (50 mg total) by mouth daily.  .Marland Kitchengabapentin (NEURONTIN) 300 MG capsule Take 1 capsule (300 mg total) by mouth 3 (three) times daily as needed (neuropathy).  .Marland Kitchenglucose blood (RELION TRUE METRIX TEST STRIPS) test strip Use as instructed to check blood sugar 2 times a day  . indomethacin (INDOCIN) 50 MG capsule TAKE 1 CAPSULE BY MOUTH THREE TIMES DAILY AS NEEDED (Patient taking differently: Take 50 mg by mouth 3 (three) times daily as needed for moderate pain. TAKE 1 CAPSULE BY MOUTH THREE TIMES DAILY AS NEEDED.)  . Insulin Pen Needle (PEN NEEDLES 3/16") 31G X 5 MM MISC 1 Container by Does not apply route 2 (two) times daily.  . metFORMIN (GLUCOPHAGE) 500 MG tablet Take 1 tablet (500 mg total) by mouth daily with breakfast AND 2 tablets (1,000 mg total) daily with supper.  . metoprolol succinate (TOPROL-XL) 25 MG 24 hr tablet Take 1 tablet (25 mg total) by mouth at bedtime.  .Marland Kitchen  Omega-3 Fatty Acids (FISH OIL) 1000 MG CAPS Take 1 capsule (1,000 mg total) by mouth 2 (two) times a day.  Marland Kitchen omeprazole (PRILOSEC) 20 MG capsule TAKE ONE CAPSULE BY MOUTH ONCE DAILY AS NEEDED  . rosuvastatin (CRESTOR) 5 MG tablet Take 1 tablet (5 mg total) by mouth daily.  . vitamin B-12 (CYANOCOBALAMIN) 1000 MCG tablet Take 1,000 mcg by mouth daily.  . [DISCONTINUED] lisinopril (PRINIVIL,ZESTRIL) 5 MG tablet Take 1 tablet (5 mg total) by mouth daily.   No facility-administered encounter medications on file as of  07/12/2020.    Allergies (verified) Lisinopril   History: Past Medical History:  Diagnosis Date  . Cancer ([place])    skin cancer removed from nose  . Cerumen impaction 2016   bilateral with otitis externa s/p ENT removal - [facility]  . GERD (gastroesophageal reflux disease)    occasional  . Gout    ?due to L great toe pain, saw Dr Gladstone Lighter  . History of chicken pox   . Hyperlipidemia   . Hypertension   . Legg-Perthes disease    Right hip  . Nasal septal deviation    monitoring - [facility]  . Prediabetes 03/22/2009  . Sepsis ([place])    In March 2018   Past Surgical History:  Procedure Laterality Date  . COLONOSCOPY  03/2011   2 tubular adenomas, rec rpt 5 yrs  . COLONOSCOPY  01/2018   diverticulosis, rpt 5 yrs [facility])  . HERNIA REPAIR     double hernia  . 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 TONSILLECTOMY    . TOTAL HIP ARTHROPLASTY Right 03/07/2017   Procedure: RIGHT TOTAL HIP ARTHROPLASTY ANTERIOR APPROACH;  Surgeon: Gaynelle Arabian, MD;  Location: [facility];  Service: Orthopedics;  Laterality: Right;  Marland Kitchen VASECTOMY    . WISDOM TOOTH EXTRACTION     Family History  Problem Relation Age of Onset  . Breast cancer Mother        with recurrence  . Alzheimer's disease Father   . Cervical cancer Sister   . Esophageal cancer Sister   . Lung cancer Sister 46  . Cancer Maternal Aunt        ?  Marland Kitchen Hypertension Maternal Uncle   . Stroke Maternal Uncle   . Coronary artery disease Paternal Uncle        MI  . Cancer Other        niece-2 types  . Stroke Maternal Grandfather    Social History   Socioeconomic History  . Marital status: Married    Spouse name: Not on file  . Number of children: Not on file  . Years of education: Not on file  . Highest education level: Not on file  Occupational History  . Occupation: part-time  Tobacco Use  . Smoking  status: Never Smoker  . Smokeless tobacco: Never Used  Vaping Use  . Vaping Use: Never used  Substance and Sexual Activity  . Alcohol use: No  . Drug use: No  . Sexual activity: Yes  Other Topics Concern  . 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  No regular exercise.   Diet: "I love my ice cream. I can't live forever", some water  Social Determinants of Health   Financial Resource Strain: Low Risk   . Difficulty of Paying Living Expenses: Not hard at all  Food Insecurity: No Food Insecurity  . Worried About Charity fundraiser in the Last Year: Never true  . Ran Out of Food in the Last Year: Never true  Transportation Needs: No Transportation Needs  . Lack of Transportation (Medical): No  . Lack of Transportation (Non-Medical): No  Physical Activity: Inactive  . Days of Exercise per Week: 0 days  . Minutes of Exercise per Session: 0 min  Stress: No Stress Concern Present  . Feeling of Stress : Not at all  Social Connections:   . Frequency of Communication with Friends and Family:   . Frequency of Social Gatherings with Friends and Family:   . Attends Religious Services:   . Active Member of Clubs or Organizations:   . Attends Archivist Meetings:   Marland Kitchen Marital Status:     Tobacco Counseling Counseling given: Not Answered   Clinical Intake:  Pre-visit preparation completed: Yes  Pain : No/denies pain     Nutritional Risks: None Diabetes: Yes CBG done?: No Did pt. bring in CBG monitor from home?: No  How often do you need to have someone help you when you read instructions, pamphlets, or other written materials from your doctor or pharmacy?: 1 - Never What is the last grade level you completed in school?: 12th  Diabetic: Yes Nutrition Risk Assessment:  Has the patient  had any N/V/D within the last 2 months?  No  Does the patient have any non-healing wounds?  No  Has the patient had any unintentional weight loss or weight gain?  No   Diabetes:  Is the patient diabetic?  Yes  If diabetic, was a CBG obtained today?  No  Did the patient bring in their glucometer from home?  Yes  How often do you monitor your CBG's? When needed.   Financial Strains and Diabetes Management:  Are you having any financial strains with the device, your supplies or your medication? No .  Does the patient want to be seen by Chronic Care Management for management of their diabetes?  No  Would the patient like to be referred to a Nutritionist or for Diabetic Management?  No   Diabetic Exams:  Diabetic Eye Exam: Completed 09/03/2019 Diabetic Foot Exam: Completed 04/12/2020   Interpreter Needed?: No  Information entered by :: CJohnson, LPN   Activities of Daily Living In your present state of health, do you have any difficulty performing the following activities: 07/12/2020 01/22/2020  Hearing? N N  Vision? N N  Difficulty concentrating or making decisions? N N  Walking or climbing stairs? N Y  Comment - No difficulties prior to contracting Covid  Dressing or bathing? N N  Doing errands, shopping? N N  Preparing Food and eating ? N -  Using the Toilet? N -  In the past six months, have you accidently leaked urine? N -  Do you have problems with loss of bowel control? N -  Managing your Medications? N -  Managing your Finances? N -  Housekeeping or managing your Housekeeping? N -  Some recent data might be hidden    Patient Care Team: Ria Bush, MD as PCP - General (Family Medicine) Debbora Dus, [facility] as Pharmacist (Pharmacist)  Indicate any recent Medical Services you may have received from other than [facility] providers in the past year (date may be approximate).  Assessment:   This is a routine wellness examination for Julies.  Hearing/Vision screen   Hearing Screening   _0  _1  _2  _3  _4  _5  _6  _7  _8   Right ear:           Left ear:           Vision Screening Comments: Patient gets annual eye exams   Dietary issues and exercise activities discussed: Current Exercise Habits: The patient does not participate in regular exercise at present, Exercise limited by: None identified  Goals    . healthy food choices     Starting 05/22/2017, I will continue to eat 3 desserts per week.     . Patient Stated     Starting 05/27/2018, I will continue to take medications as prescribed.      . Patient Stated     07/12/2020, I will maintain and continue medications as prescribed.     . Pharmacy Care Plan     CARE PLAN ENTRY (see longitudinal plan of care for additional care plan information)  Current Barriers:  . Chronic Disease Management support, education, and care coordination needs related to Diabetes and Neuropathy  Diabetes Lab Results  Component Value Date/Time   HGBA1C 9.0 (H) 01/25/2020 08:37 AM   HGBA1C 7.3 (H) 07/04/2019 09:07 AM  A1c per fructosamine 02/24/20: 5.3% . Pharmacist Clinical Goal(s): o Over the next 6 months, patient will work with PharmD and providers to maintain A1c goal <7% . Current regimen:  o Metformin 500 mg - 1 tablet with breakfast and 2 tablets with supper . Interventions: o Re-evaluate need for metformin with labs in August  . Patient self care activities - Over the next 6 months, patient will: o Check blood sugar with symptoms of hypoglycemia and daily for 7 days prior to appointment with Dr. Danise Mina in August, document, and provide at future appointments o Contact provider with any episodes of hypoglycemia (shakiness, sweatiness, hunger, fatigue)  Nerve Pain . Pharmacist Clinical Goal(s) o Over the next 6 months, patient will work with PharmD and providers to improve nerve pain . Current regimen:  o Gabapentin 100 mg - 3 capsules twice daily . Interventions: o Consult  PCP to increase gabapentin dosing  . Patient self care activities - Over the next 6 months, patient will: o Continue current medications until further contact from clinic   Medication management . Pharmacist Clinical Goal(s): o Over the next 6 months, patient will work with PharmD and providers to maintain optimal medication adherence . [facility] . Interventions o Comprehensive medication review performed. o Utilize [facility] pharmacy for medication synchronization, packaging and delivery . Patient self care activities - Over the next 6 months, patient will: o Proofreader with any medication changes, questions, or refills needed  Initial goal documentation      Depression Screen PHQ 2/9 Scores 07/12/2020 07/10/2019 05/27/2018 05/22/2017 11/17/2015 11/11/2014 11/04/2013  PHQ - 2 Score 0 0 0 0 0 0 0  PHQ- 9 Score 0 - 0 - - - -    Fall Risk Fall Risk  07/12/2020 07/10/2019 05/27/2018 05/22/2017 11/17/2015  Falls in the past year? 0 0 No No No  Number falls in past yr: 0 - - - -  Injury with Fall? 0 - - - -  Risk for fall due to : Medication side effect - - - -  Follow up Falls evaluation completed;Falls prevention discussed - - - -    Any stairs in or around the home? Yes  If so, are there any without handrails? No  Home free of loose throw rugs in walkways, pet beds, electrical cords, etc? Yes  Adequate lighting in your home to reduce risk of falls? Yes   ASSISTIVE DEVICES UTILIZED TO PREVENT FALLS:  Life alert? No  Use of a cane, walker or w/c? No  Grab bars in the bathroom? No  Shower chair or bench in shower? No  Elevated toilet seat or a handicapped toilet? No   TIMED UP AND GO:  Was the test performed? N/A, telephonic visit .    Cognitive Function: MMSE - Mini Mental State Exam 07/12/2020 05/27/2018 05/22/2017  Orientation to time _0 Orientation to Place _1 Registration _2 Attention/ Calculation 5 0 0  Recall _3 Language- name 2  objects - 0 0  Language- repeat _4 Language- follow 3 step command - 3 3  Language- read & follow direction - 0 0  Write a sentence - 0 0  Copy design - 0 0  Total score - 20 20  Mini Cog  Mini-Cog screen was completed. Maximum score is 22. A value of 0 denotes this part of the MMSE was not completed or the patient failed this part of the Mini-Cog screening.       Immunizations Immunization History  Administered Date(s) Administered  . Influenza Split 10/15/2012  . Influenza,inj,Quad PF,6+ Mos 11/11/2014, 11/17/2015, 08/31/2016, 11/28/2017, 10/21/2018  . PFIZER SARS-COV-2 Vaccination 04/19/2020, 05/11/2020  . Pneumococcal Conjugate-13 11/11/2014  . Pneumococcal Polysaccharide-23 10/15/2012  . Td 12/12/1996, 03/22/2009  . Zoster 10/13/2013    TDAP status: Due, Education has been provided regarding the importance of this vaccine. Advised may receive this vaccine at local pharmacy or Health Dept. Aware to provide a copy of the vaccination record if obtained from local pharmacy or Health Dept. Verbalized acceptance and understanding. Flu Vaccine status: available in the office at the end of August  Pneumococcal vaccine status: Up to date Covid-19 vaccine status: Completed vaccines  Qualifies for Shingles Vaccine? Yes   Zostavax completed Yes   Shingrix Completed?: No.    Education has been provided regarding the importance of this vaccine. Patient has been advised to call insurance company to determine out of pocket expense if they have not yet received this vaccine. Advised may also receive vaccine at local pharmacy or Health Dept. Verbalized acceptance and understanding.  Screening Tests Health Maintenance  Topic Date Due  . DTAP VACCINES (1) 12/20/1946  . URINE MICROALBUMIN  07/03/2020  . INFLUENZA VACCINE  07/11/2020  . TETANUS/TDAP  07/12/2021 (Originally 03/23/2019)  . OPHTHALMOLOGY EXAM  09/02/2020  . HEMOGLOBIN A1C  01/05/2021  . FOOT EXAM  04/12/2021  .  COLONOSCOPY  01/22/2023  . COVID-19 Vaccine  Completed  . Hepatitis C Screening  Completed  . PNA vac Low Risk Adult  Completed  . DTaP/Tdap/Td  Discontinued    Health Maintenance  Health Maintenance Due  Topic Date Due  . DTAP VACCINES (1) 12/20/1946  . URINE MICROALBUMIN  07/03/2020  . INFLUENZA VACCINE  07/11/2020    Colorectal cancer screening: Completed 01/22/2018. Repeat every 5 years  Lung Cancer Screening: (Low Dose CT Chest recommended if Age 66-80 years, 30 pack-year currently smoking OR have quit w/in 15years.) does not qualify.    Additional Screening:  Hepatitis C Screening: does qualify; Completed 08/30/2016  Vision Screening: Recommended annual ophthalmology exams for early detection of glaucoma and other disorders of  the eye. Is the patient up to date with their annual eye exam?  Yes  Who is the provider or what is the name of the office in which the patient attends annual eye exams? Dr. Parke Simmers If pt is not established with a provider, would they like to be referred to a provider to establish care? No .   Dental Screening: Recommended annual dental exams for proper oral hygiene  Community Resource Referral / Chronic Care Management: CRR required this visit?  No   CCM required this visit?  No      Plan:     I have personally reviewed and noted the following in the patient's chart:   . Medical and social history . Use of alcohol, tobacco or illicit drugs  . Current medications and supplements . Functional ability and status . Nutritional status . Physical activity . Advanced directives . List of other physicians . Hospitalizations, surgeries, and ER visits in previous 12 months . Vitals . Screenings to include cognitive, depression, and falls . Referrals and appointments  In addition, I have reviewed and discussed with patient certain preventive protocols, quality metrics, and best practice recommendations. A written personalized care plan for  preventive services as well as general preventive health recommendations were provided to patient.   Due to this being a telephonic visit, the after visit summary with patients personalized plan was offered to patient via mail or my-chart. Patient preferred to pick up at office at next visit.   Andrez Grime, LPN   07/18/1835

## 2020-07-12 NOTE — Assessment & Plan Note (Signed)
Advanced directives: does not want prolonged life support.Okfor reversible cause. Wife would be HCPOA.

## 2020-07-12 NOTE — Progress Notes (Signed)
This visit was conducted in person.  BP 126/76 (BP Location: Left Arm, Patient Position: Sitting, Cuff Size: Large)    Pulse 67    Temp (!) 97.5 F (36.4 C) (Temporal)    Ht 5' 10" (1.778 m)    Wt (!) 232 lb 7 oz (105.4 kg)    SpO2 95%    BMI 33.35 kg/m    CC: CPE Subjective:    Patient ID: Devin Huffman, male    DOB: March 18, 1946, 74 y.o.   MRN: [id number]  HPI: Devin Huffman is a 74 y.o. male presenting on 07/12/2020 for Annual Exam (Prt 2. )   Saw health advisor earlier today for medicare wellness visit. Note reviewed.    No exam data present    Clinical Support from 07/12/2020 in [place] [facility] Total Score 0      Fall Risk  07/12/2020 07/10/2019 05/27/2018 05/22/2017 11/17/2015  Falls in the past year? 0 0 No No No  Number falls in past yr: 0 - - - -  Injury with Fall? 0 - - - -  Risk for fall due to : Medication side effect - - - -  Follow up Falls evaluation completed;Falls prevention discussed - - - -   Peripheral neuropathy - feet stay "hot' - we have slowly tapered up on gabapentin dosing to 344m TID. Unsure if gabapentin is helping. Continues vit B12 daily.   2 wks ago had leg abrasion hip R anterior leg against trainer with small hematoma - healing well. Will update Td today.   DM - he's been using some form of insulin - 50 units when cbg's are >160.   COVID19 infection 01/2020 s/p mAb treatment then hospitalization with COVID PNA s/p decadron/remdesivir, then omnicef and prednisone/albuterol with recovery. Fatigue largely resolved.   Slow weight gain noted.   Preventative: Colon cancer screening - Dr. SFuller Huffman 03/2011;2 polyps - tubular adenoma, rpt 5 yrs. COLONOSCOPY 01/2018 diverticulosis, rpt 5 yrs (Fuller Huffman Prostate cancer screening - PSA yearly, DRE every few years. Mild nocturia, strong stream. Discussed stopping screening.  Flu shotyearly Tetanus 2010, update Td today Pneumovax 2013, prevnar 2015 [facility]  05/[facility]  zostavax11/02/2013   Shingrix - discussed, will check at pharmacy  Advanced directives: does not want prolonged life support.Okfor reversible cause. Wife would be HCPOA.  Seat belt use discussed Sunscreen use discussed. No changing moles on skin. 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 No regular exercise.  Diet: "I love my ice cream. I can't live forever", some water     Relevant past medical, surgical, family and social history reviewed and updated as indicated. Interim medical history since our last visit reviewed. Allergies and medications reviewed and updated. Outpatient Medications Prior to Visit  Medication Sig Dispense Refill   amLODipine (NORVASC) 5 MG tablet Take 1 tablet (5 mg total) by mouth at bedtime. 90 tablet 0   Ascorbic Acid (VITAMIN C) 1000 MG tablet Take 1,000 mg by mouth every evening.      aspirin EC 81 MG tablet Take 81 mg by mouth every evening.      B Complex-Folic Acid (B COMPLEX-VITAMIN B12 PO) Take 1 tablet  by mouth every evening.      blood glucose meter kit and supplies KIT Dispense based on patient and insurance preference. Use up to four times daily as directed. (FOR ICD-9 250.00, 250.01). 1 each 0   Cholecalciferol (VITAMIN D3) 50 MCG (2000 UT) capsule Take 50 mcg by mouth every evening.      Coenzyme Q10 50 MG CAPS Take 1 capsule (50 mg total) by mouth daily.  0   gabapentin (NEURONTIN) 300 MG capsule Take 1 capsule (300 mg total) by mouth 3 (three) times daily as needed (neuropathy). 90 capsule 1   indomethacin (INDOCIN) 50 MG capsule TAKE 1 CAPSULE BY MOUTH THREE TIMES DAILY AS NEEDED (Patient taking differently: Take 50 mg by mouth 3 (three) times daily as  needed for moderate pain. TAKE 1 CAPSULE BY MOUTH THREE TIMES DAILY AS NEEDED.) 30 capsule 0   metFORMIN (GLUCOPHAGE) 500 MG tablet Take 1 tablet (500 mg total) by mouth daily with breakfast AND 2 tablets (1,000 mg total) daily with supper. 270 tablet 0   metoprolol succinate (TOPROL-XL) 25 MG 24 hr tablet Take 1 tablet (25 mg total) by mouth at bedtime. 90 tablet 0   Omega-3 Fatty Acids (FISH OIL) 1000 MG CAPS Take 1 capsule (1,000 mg total) by mouth 2 (two) times a day.  0   omeprazole (PRILOSEC) 20 MG capsule TAKE ONE CAPSULE BY MOUTH ONCE DAILY AS NEEDED 90 capsule 0   rosuvastatin (CRESTOR) 5 MG tablet Take 1 tablet (5 mg total) by mouth daily. 90 tablet 0   vitamin B-12 (CYANOCOBALAMIN) 1000 MCG tablet Take 1,000 mcg by mouth daily.     glucose blood (RELION TRUE METRIX TEST STRIPS) test strip Use as instructed to check blood sugar 2 times a day 100 each 0   Insulin Pen Needle (PEN NEEDLES 3/16") 31G X 5 MM MISC 1 Container by Does not apply route 2 (two) times daily. 1 each 0   insulin isophane & regular human (NOVOLIN 70/30 FLEXPEN) (70-30) 100 UNIT/ML KwikPen Inject 10 Units into the skin daily. With breakfast     No facility-administered medications prior to visit.     Per HPI unless specifically indicated in ROS section below Review of Systems  Constitutional: Negative for activity change, appetite change, chills, fatigue, fever and unexpected weight change.  HENT: Negative for hearing loss.   Eyes: Negative for visual disturbance.  Respiratory: Negative for cough, chest tightness, shortness of breath and wheezing.   Cardiovascular: Negative for chest pain, palpitations and leg swelling.  Gastrointestinal: Negative for abdominal distention, abdominal pain, blood in stool, constipation, diarrhea, nausea and vomiting.  Genitourinary: Negative for difficulty urinating and hematuria.  Musculoskeletal: Negative for arthralgias, myalgias and neck pain.  Skin: Negative for rash.    Neurological: Negative for dizziness, seizures, syncope and headaches.  Hematological: Negative for adenopathy. Does not bruise/bleed easily.  Psychiatric/Behavioral: Negative for dysphoric mood. The patient is not nervous/anxious.    Objective:  BP 126/76 (BP Location: Left Arm, Patient Position: Sitting, Cuff Size: Large)    Pulse 67    Temp (!) 97.5 F (36.4 C) (Temporal)    Ht 5' 10" (1.778 m)    Wt (!) 232 lb 7 oz (105.4 kg)    SpO2 95%    BMI 33.35 kg/m   Wt Readings from Last 3 Encounters:  07/12/20 (!) 232 lb 7 oz (105.4 kg)  04/12/20 228 lb (103.4 kg)  02/10/20 224 lb 7 oz (101.8 kg)  Exam °Vitals and nursing note reviewed.  °Constitutional:   °   General: He is not in acute distress. °   Appearance: Normal appearance. He is well-developed. He is not ill-appearing.  °HENT:  °   Head: Normocephalic and atraumatic.  °   Right Ear: Hearing, tympanic membrane, ear canal and external ear normal.  °   Left Ear: Hearing, tympanic membrane, ear canal and external ear normal.  °   Mouth/Throat:  °   Pharynx: Uvula midline.  °Eyes:  °   General: No scleral icterus. °   Extraocular Movements: Extraocular movements intact.  °   Conjunctiva/sclera: Conjunctivae normal.  °   Pupils: Pupils are equal, round, and reactive to light.  °   Comments: Cataracts present bilaterally  °Cardiovascular:  °   Rate and Rhythm: Regular rhythm. Bradycardia present.  °   Pulses: Normal pulses.     °     Radial pulses are 2+ on the right side and 2+ on the left side.  °   Heart sounds: Normal heart sounds. No murmur heard.  ° °Pulmonary:  °   Effort: Pulmonary effort is normal. No respiratory distress.  °   Breath sounds: Normal breath sounds. No wheezing, rhonchi or rales.  °Abdominal:  °   General: Abdomen is flat. Bowel sounds are normal. There is no distension.  °   Palpations: Abdomen is soft. There is no mass.  °   Tenderness: There is no abdominal tenderness. There is no guarding or rebound.  °    Hernia: No hernia is present.  °Musculoskeletal:     °   General: Normal range of motion.  °   Cervical back: Normal range of motion and neck supple.  °   Right lower leg: No edema.  °   Left lower leg: No edema.  °Lymphadenopathy:  °   Cervical: No cervical adenopathy.  °Skin: °   General: Skin is warm and dry.  °   Findings: Abrasion present. No rash.  ° °    °   Comments: RLE anterior shin with abrasion with underlying hematoma  °Neurological:  °   General: No focal deficit present.  °   Mental Status: He is alert and oriented to person, place, and time.  °   Comments: CN grossly intact, station and gait intact  °Psychiatric:     °   Mood and Affect: Mood normal.     °   Behavior: Behavior normal.     °   Thought Content: Thought content normal.     °   Judgment: Judgment normal.  ° ° °   °Results for orders placed or performed in visit on 07/05/20  °Vitamin B12  °Result Value Ref Range  ° Vitamin B-12 415 211 - 911 pg/mL  °Uric acid  °Result Value Ref Range  ° Uric Acid, Serum 8.5 (H) 4.0 - 7.8 mg/dL  °PSA  °Result Value Ref Range  ° PSA 1.12 0.10 - 4.00 ng/mL  °Hemoglobin A1c  °Result Value Ref Range  ° Hgb A1c MFr Bld 7.2 (H) 4.6 - 6.5 %  °Lipid panel  °Result Value Ref Range  ° Cholesterol 124 0 - 200 mg/dL  ° Triglycerides 172.0 (H) 0 - 149 mg/dL  ° HDL 43.00 >39.00 mg/dL  ° VLDL 34.4 0.0 - 40.0 mg/dL  ° LDL Cholesterol 47 0 - 99 mg/dL  ° Total CHOL/HDL Ratio 3   ° NonHDL 81.17   °Comprehensive metabolic panel  °  Result Value Ref Range  ° Sodium 141 135 - 145 mEq/L  ° Potassium 4.9 3.5 - 5.1 mEq/L  ° Chloride 104 96 - 112 mEq/L  ° CO2 30 19 - 32 mEq/L  ° Glucose, Bld 155 (H) 70 - 99 mg/dL  ° BUN 17 6 - 23 mg/dL  ° Creatinine, Ser 0.97 0.40 - 1.50 mg/dL  ° Total Bilirubin 0.6 0.2 - 1.2 mg/dL  ° Alkaline Phosphatase 73 39 - 117 U/L  ° AST 19 0 - 37 U/L  ° ALT 25 0 - 53 U/L  ° Total Protein 6.6 6.0 - 8.3 g/dL  ° Albumin 4.2 3.5 - 5.2 g/dL  ° GFR 75.31 >60.00 mL/min  ° Calcium 9.4 8.4 - 10.5 mg/dL  ° °Assessment  & Huffman:  °This visit occurred during the SARS-CoV-2 public health emergency.  Safety protocols were in place, including screening questions prior to the visit, additional usage of staff PPE, and extensive cleaning of exam room while observing appropriate contact time as indicated for disinfecting solutions.  ° °Problem List Items Addressed This Visit   ° Vitamin B12 deficiency  °  Levels repleted - continue oral replacement.  °  °  ° Type 2 diabetes mellitus with other specified complication (HCC)  °  Improving however slightly above goal. He has been taking novolog 70/30 PRN intermittently for elevated sugars - advised to change to 10u daily with breakfast - anticipate this will help glycemic control.  °  °  ° Relevant Medications  ° insulin isophane & regular human (NOVOLIN 70/30 FLEXPEN) (70-30) 100 UNIT/ML KwikPen  ° glucose blood (RELION TRUE METRIX TEST STRIPS) test strip  ° Other Relevant Orders  ° Microalbumin / creatinine urine ratio  ° Type 2 diabetes mellitus with diabetic neuropathy, unspecified (HCC)  °  Continue gabapentin 300mg TID - stable period °  °  ° Relevant Medications  ° insulin isophane & regular human (NOVOLIN 70/30 FLEXPEN) (70-30) 100 UNIT/ML KwikPen  ° Thoracic aortic ectasia (HCC)  °  Continue to monitor.  °  °  ° Obesity, Class I, BMI 30-34.9  °  Discussed weight gain noted. Encouraged healthy diet and lifestyle changes to affect sustainable weight loss.  °  °  ° History of right hip replacement  ° Health maintenance examination - Primary  °  Preventative protocols reviewed and updated unless pt declined. °Discussed healthy diet and lifestyle.  °  °  ° Gout  °  No recent gout flares. Will continue to monitor off urate lowering medication.  °  °  ° Essential hypertension  °  Chronic, stable on amlodipine and toprol XL °  °  ° Dyslipidemia associated with type 2 diabetes mellitus (HCC)  °  Chronic, stable on crestor - continue °The ASCVD Risk score (Goff DC Jr., et al., 2013) failed to  calculate for the following reasons: °  The valid total cholesterol range is 130 to 320 mg/dL  °  °  ° Relevant Medications  ° insulin isophane & regular human (NOVOLIN 70/30 FLEXPEN) (70-30) 100 UNIT/ML KwikPen  ° Diabetic cataract of both eyes (HCC)  °  Seeing [facility] .  °  °  ° Relevant Medications  ° insulin isophane & regular human (NOVOLIN 70/30 FLEXPEN) (70-30) 100 UNIT/ML KwikPen  ° Advanced care planning/counseling discussion  °  Advanced directives: does not want prolonged life support. Ok for reversible cause. Wife would be HCPOA.  °  °  °  °Other Visit Diagnoses   °   Need for Td vaccine       Relevant Orders   Td vaccine greater than or equal to 7yo preservative free IM (Completed)       Meds ordered this encounter  Medications   glucose blood (RELION TRUE METRIX TEST STRIPS) test strip    Sig: Use as instructed to check blood sugar 2 times a day    Dispense:  100 each    Refill:  3   Insulin Pen Needle (RELION PEN NEEDLES) 31G X 6 MM MISC    Sig: Use as directed to check sugars twice daily    Dispense:  100 each    Refill:  3   Orders Placed This Encounter  Procedures   Td vaccine greater than or equal to 7yo preservative free IM   Microalbumin / creatinine urine ratio    Patient instructions: Tetanus (Td) shot today  Urine test (microalbumin) today  If interested, check with pharmacy about new 2 shot shingles series (shingrix).  Start novolog 70/30 10 units daily with breakfast.  Return as needed or in 6 months for follow up visit.   Follow up Huffman: Return in about 6 months (around 01/12/2021) for follow up visit.  Ria Bush, MD

## 2020-07-12 NOTE — Assessment & Plan Note (Signed)
Seeing [facility] .

## 2020-07-12 NOTE — Patient Instructions (Addendum)
Tetanus (Td) shot today  Urine test (microalbumin) today  If interested, check with pharmacy about new 2 shot shingles series (shingrix).  Start novolog 70/30 10 units daily with breakfast.  Return as needed or in 6 months for follow up visit.   Health Maintenance After Age 76 After age 1, you are at a higher risk for certain long-term diseases and infections as well as injuries from falls. Falls are a major cause of broken bones and head injuries in people who are older than age 63. Getting regular preventive care can help to keep you healthy and well. Preventive care includes getting regular testing and making lifestyle changes as recommended by your health care provider. Talk with your health care provider about:  Which screenings and tests you should have. A screening is a test that checks for a disease when you have no symptoms.  A diet and exercise plan that is right for you. What should I know about screenings and tests to prevent falls? Screening and testing are the best ways to find a health problem early. Early diagnosis and treatment give you the best chance of managing medical conditions that are common after age 32. Certain conditions and lifestyle choices may make you more likely to have a fall. Your health care provider may recommend:  Regular vision checks. Poor vision and conditions such as cataracts can make you more likely to have a fall. If you wear glasses, make sure to get your prescription updated if your vision changes.  Medicine review. Work with your health care provider to regularly review all of the medicines you are taking, including over-the-counter medicines. Ask your health care provider about any side effects that may make you more likely to have a fall. Tell your health care provider if any medicines that you take make you feel dizzy or sleepy.  Osteoporosis screening. Osteoporosis is a condition that causes the bones to get weaker. This can make the bones weak  and cause them to break more easily.  Blood pressure screening. Blood pressure changes and medicines to control blood pressure can make you feel dizzy.  Strength and balance checks. Your health care provider may recommend certain tests to check your strength and balance while standing, walking, or changing positions.  Foot health exam. Foot pain and numbness, as well as not wearing proper footwear, can make you more likely to have a fall.  Depression screening. You may be more likely to have a fall if you have a fear of falling, feel emotionally low, or feel unable to do activities that you used to do.  Alcohol use screening. Using too much alcohol can affect your balance and may make you more likely to have a fall. What actions can I take to lower my risk of falls? General instructions  Talk with your health care provider about your risks for falling. Tell your health care provider if: ? You fall. Be sure to tell your health care provider about all falls, even ones that seem minor. ? You feel dizzy, sleepy, or off-balance.  Take over-the-counter and prescription medicines only as told by your health care provider. These include any supplements.  Eat a healthy diet and maintain a healthy weight. A healthy diet includes low-fat dairy products, low-fat (lean) meats, and fiber from whole grains, beans, and lots of fruits and vegetables. Home safety  Remove any tripping hazards, such as rugs, cords, and clutter.  Install safety equipment such as grab bars in bathrooms and safety rails on  stairs.  Keep rooms and walkways well-lit. Activity   Follow a regular exercise program to stay fit. This will help you maintain your balance. Ask your health care provider what types of exercise are appropriate for you.  If you need a cane or walker, use it as recommended by your health care provider.  Wear supportive shoes that have nonskid soles. Lifestyle  Do not drink alcohol if your health  care provider tells you not to drink.  If you drink alcohol, limit how much you have: ? 0-1 drink a day for women. ? 0-2 drinks a day for men.  Be aware of how much alcohol is in your drink. In the [place] one drink equals one typical bottle of beer (12 oz), one-half glass of wine (5 oz), or one shot of hard liquor (1 oz).  Do not use any products that contain nicotine or tobacco, such as cigarettes and e-cigarettes. If you need help quitting, ask your health care provider. Summary  Having a healthy lifestyle and getting preventive care can help to protect your health and wellness after age 58.  Screening and testing are the best way to find a health problem early and help you avoid having a fall. Early diagnosis and treatment give you the best chance for managing medical conditions that are more common for people who are older than age 67.  Falls are a major cause of broken bones and head injuries in people who are older than age 31. Take precautions to prevent a fall at home.  Work with your health care provider to learn what changes you can make to improve your health and wellness and to prevent falls. This information is not intended to replace advice given to you by your health care provider. Make sure you discuss any questions you have with your health care provider. Document Revised: 03/20/2019 Document Reviewed: 10/10/2017 Elsevier Patient Education  2020 [employer].

## 2020-07-12 NOTE — Patient Instructions (Signed)
Devin Huffman , Thank you for taking time to come for your Medicare Wellness Visit. I appreciate your ongoing commitment to your health goals. Please review the following plan we discussed and let me know if I can assist you in the future.   Screening recommendations/referrals: Colonoscopy: Up to date, completed 01/22/2018, due 01/2023 Recommended yearly ophthalmology/optometry visit for glaucoma screening and checkup Recommended yearly dental visit for hygiene and checkup  Vaccinations: Influenza vaccine: due Fall 2021 Pneumococcal vaccine: Completed series Tdap vaccine: decline-insurance/financial Shingles vaccine: due, check with your insurance regarding coverage   Covid-19: Completed series  Advanced directives: Advance directive discussed with you today. Even though you declined this today please call our office should you change your mind and we can give you the proper paperwork for you to fill out.   Conditions/risks identified: Diabetes  Next appointment: Follow up in one year for your annual wellness visit.   Preventive Care 47 Years and Older, Male Preventive care refers to lifestyle choices and visits with your health care provider that can promote health and wellness. What does preventive care include?  A yearly physical exam. This is also called an annual well check.  Dental exams once or twice a year.  Routine eye exams. Ask your health care provider how often you should have your eyes checked.  Personal lifestyle choices, including:  Daily care of your teeth and gums.  Regular physical activity.  Eating a healthy diet.  Avoiding tobacco and drug use.  Limiting alcohol use.  Practicing safe sex.  Taking low doses of aspirin every day.  Taking vitamin and mineral supplements as recommended by your health care provider. What happens during an annual well check? The services and screenings done by your health care provider during your annual well check will  depend on your age, overall health, lifestyle risk factors, and family history of disease. Counseling  Your health care provider may ask you questions about your:  Alcohol use.  Tobacco use.  Drug use.  Emotional well-being.  Home and relationship well-being.  Sexual activity.  Eating habits.  History of falls.  Memory and ability to understand (cognition).  Work and work Statistician. Screening  You may have the following tests or measurements:  Height, weight, and BMI.  Blood pressure.  Lipid and cholesterol levels. These may be checked every 5 years, or more frequently if you are over 70 years old.  Skin check.  Lung cancer screening. You may have this screening every year starting at age 42 if you have a 30-pack-year history of smoking and currently smoke or have quit within the past 15 years.  Fecal occult blood test (FOBT) of the stool. You may have this test every year starting at age 68.  Flexible sigmoidoscopy or colonoscopy. You may have a sigmoidoscopy every 5 years or a colonoscopy every 10 years starting at age 2.  Prostate cancer screening. Recommendations will vary depending on your family history and other risks.  Hepatitis C blood test.  Hepatitis B blood test.  Sexually transmitted disease (STD) testing.  Diabetes screening. This is done by checking your blood sugar (glucose) after you have not eaten for a while (fasting). You may have this done every 1-3 years.  Abdominal aortic aneurysm (AAA) screening. You may need this if you are a current or former smoker.  Osteoporosis. You may be screened starting at age 27 if you are at high risk. Talk with your health care provider about your test results, treatment options, and if  necessary, the need for more tests. Vaccines  Your health care provider may recommend certain vaccines, such as:  Influenza vaccine. This is recommended every year.  Tetanus, diphtheria, and acellular pertussis (Tdap,  Td) vaccine. You may need a Td booster every 10 years.  Zoster vaccine. You may need this after age 52.  Pneumococcal 13-valent conjugate (PCV13) vaccine. One dose is recommended after age 20.  Pneumococcal polysaccharide (PPSV23) vaccine. One dose is recommended after age 31. Talk to your health care provider about which screenings and vaccines you need and how often you need them. This information is not intended to replace advice given to you by your health care provider. Make sure you discuss any questions you have with your health care provider. Document Released: 12/24/2015 Document Revised: 08/16/2016 Document Reviewed: 09/28/2015 Elsevier Interactive Patient Education  2017 [place] Prevention in the Home Falls can cause injuries. They can happen to people of all ages. There are many things you can do to make your home safe and to help prevent falls. What can I do on the outside of my home?  Regularly fix the edges of walkways and driveways and fix any cracks.  Remove anything that might make you trip as you walk through a door, such as a raised step or threshold.  Trim any bushes or trees on the path to your home.  Use bright outdoor lighting.  Clear any walking paths of anything that might make someone trip, such as rocks or tools.  Regularly check to see if handrails are loose or broken. Make sure that both sides of any steps have handrails.  Any raised decks and porches should have guardrails on the edges.  Have any leaves, snow, or ice cleared regularly.  Use sand or salt on walking paths during winter.  Clean up any spills in your garage right away. This includes oil or grease spills. What can I do in the bathroom?  Use night lights.  Install grab bars by the toilet and in the tub and shower. Do not use towel bars as grab bars.  Use non-skid mats or decals in the tub or shower.  If you need to sit down in the shower, use a plastic, non-slip  stool.  Keep the floor dry. Clean up any water that spills on the floor as soon as it happens.  Remove soap buildup in the tub or shower regularly.  Attach bath mats securely with double-sided non-slip rug tape.  Do not have throw rugs and other things on the floor that can make you trip. What can I do in the bedroom?  Use night lights.  Make sure that you have a light by your bed that is easy to reach.  Do not use any sheets or blankets that are too big for your bed. They should not hang down onto the floor.  Have a firm chair that has side arms. You can use this for support while you get dressed.  Do not have throw rugs and other things on the floor that can make you trip. What can I do in the kitchen?  Clean up any spills right away.  Avoid walking on wet floors.  Keep items that you use a lot in easy-to-reach places.  If you need to reach something above you, use a strong step stool that has a grab bar.  Keep electrical cords out of the way.  Do not use floor polish or wax that makes floors slippery. If you must  use wax, use non-skid floor wax.  Do not have throw rugs and other things on the floor that can make you trip. What can I do with my stairs?  Do not leave any items on the stairs.  Make sure that there are handrails on both sides of the stairs and use them. Fix handrails that are broken or loose. Make sure that handrails are as long as the stairways.  Check any carpeting to make sure that it is firmly attached to the stairs. Fix any carpet that is loose or worn.  Avoid having throw rugs at the top or bottom of the stairs. If you do have throw rugs, attach them to the floor with carpet tape.  Make sure that you have a light switch at the top of the stairs and the bottom of the stairs. If you do not have them, ask someone to add them for you. What else can I do to help prevent falls?  Wear shoes that:  Do not have high heels.  Have rubber bottoms.  Are  comfortable and fit you well.  Are closed at the toe. Do not wear sandals.  If you use a stepladder:  Make sure that it is fully opened. Do not climb a closed stepladder.  Make sure that both sides of the stepladder are locked into place.  Ask someone to hold it for you, if possible.  Clearly mark and make sure that you can see:  Any grab bars or handrails.  First and last steps.  Where the edge of each step is.  Use tools that help you move around (mobility aids) if they are needed. These include:  Canes.  Walkers.  Scooters.  Crutches.  Turn on the lights when you go into a dark area. Replace any light bulbs as soon as they burn out.  Set up your furniture so you have a clear path. Avoid moving your furniture around.  If any of your floors are uneven, fix them.  If there are any pets around you, be aware of where they are.  Review your medicines with your doctor. Some medicines can make you feel dizzy. This can increase your chance of falling. Ask your doctor what other things that you can do to help prevent falls. This information is not intended to replace advice given to you by your health care provider. Make sure you discuss any questions you have with your health care provider. Document Released: 09/23/2009 Document Revised: 05/04/2016 Document Reviewed: 01/01/2015 Elsevier Interactive Patient Education  2017 [employer].

## 2020-07-12 NOTE — Assessment & Plan Note (Signed)
Continue gabapentin 300mg  TID - stable period

## 2020-07-12 NOTE — Assessment & Plan Note (Signed)
Preventative protocols reviewed and updated unless pt declined. Discussed healthy diet and lifestyle.  

## 2020-07-12 NOTE — Assessment & Plan Note (Signed)
Chronic, stable on crestor - continue. The ASCVD Risk score (Goff DC Jr., et al., 2013) failed to calculate for the following reasons:   The valid total cholesterol range is 130 to 320 mg/dL  

## 2020-07-12 NOTE — Assessment & Plan Note (Signed)
Continue to monitor

## 2020-07-12 NOTE — Assessment & Plan Note (Signed)
Discussed weight gain noted. Encouraged healthy diet and lifestyle changes to affect sustainable weight loss.

## 2020-07-13 ENCOUNTER — Telehealth: Payer: Self-pay

## 2020-07-13 NOTE — Telephone Encounter (Signed)
Lvm asking pt to call back.  Need to relay lab results.  Per Dr. Darnell Level: Your urine protein test returned ok.

## 2020-07-13 NOTE — Telephone Encounter (Signed)
Pt called back and I notified him of urine test results and Dr. Synthia Innocent comments

## 2020-08-19 ENCOUNTER — Telehealth: Payer: Self-pay

## 2020-08-19 MED ORDER — ROSUVASTATIN CALCIUM 5 MG PO TABS: 5.0000 mg | ORAL_TABLET | Freq: Every day | ORAL | 3 refills | Status: DC

## 2020-08-19 NOTE — Addendum Note (Signed)
Addended by: Ria Bush on: 08/19/2020 01:01 PM   Modules accepted: Orders

## 2020-08-19 NOTE — Chronic Care Management (AMB) (Signed)
Patient is using [facility] for delivery and coordination.   The following medications need a refill:   Crestor  Please send refill to [facility] in [place].   Thank you,  Emeterio Reeve

## 2020-08-24 ENCOUNTER — Other Ambulatory Visit: Payer: Self-pay | Admitting: Family Medicine

## 2020-08-25 ENCOUNTER — Other Ambulatory Visit: Payer: Self-pay | Admitting: *Deleted

## 2020-08-25 ENCOUNTER — Other Ambulatory Visit: Payer: Self-pay

## 2020-08-25 MED ORDER — METOPROLOL SUCCINATE ER 25 MG PO TB24: 25.0000 mg | ORAL_TABLET | Freq: Every day | ORAL | 3 refills | Status: DC

## 2020-08-25 MED ORDER — AMLODIPINE BESYLATE 5 MG PO TABS: 5.0000 mg | ORAL_TABLET | Freq: Every day | ORAL | 3 refills | Status: DC

## 2020-08-25 NOTE — Telephone Encounter (Signed)
Amlodipine and metoprolol already refilled.   Gabapentin Last rx:  06/18/20, #90/1 Last OV:  07/12/20, AWV prt 2 Next OV:  01/12/21, 6 mo f/u

## 2020-08-25 NOTE — Telephone Encounter (Addendum)
-----   Message from Burnice Logan, [facility] sent at 08/25/2020 10:59 AM EDT ----- Regarding: Refills to [facility] -   Mr. Londo is due for a delivery this week from [facility] in [place] and needs 90 day supplies of the following medications submitted if possible.   Gabapentin 300 mg Metoprolol ER 25 mg Amlodipine 5 mg  Feel free to let me know if you have any questions/concerns.   Thanks so much! Sherre Poot, PharmD, [facility] Clinical Pharmacist [facility] [phone] (office) [phone] (mobile)

## 2020-08-26 MED ORDER — GABAPENTIN 300 MG PO CAPS
300.0000 mg | ORAL_CAPSULE | Freq: Three times a day (TID) | ORAL | 1 refills | Status: DC | PRN
Start: 2020-08-26 — End: 2021-01-12

## 2020-08-26 NOTE — Telephone Encounter (Signed)
ERx 

## 2020-08-27 ENCOUNTER — Telehealth: Payer: Self-pay

## 2020-08-27 NOTE — Progress Notes (Signed)
Chronic Care Management Pharmacy Assistant   Name: Devin Huffman  MRN: [id number] DOB: [birth year]  Reason for Encounter: Medication Review  Patient Questions:  1.  Have you seen any other providers since your last visit? No  2.  Any changes in your medicines or health? Yes, see note below    PCP : Ria Bush, MD  Allergies:   Allergies  Allergen Reactions  . Lisinopril Hives and Swelling    ?angioedema - lip swelling, hives    Medications: Outpatient Encounter Medications as of 08/27/2020  Medication Sig  . amLODipine (NORVASC) 5 MG tablet Take 1 tablet (5 mg total) by mouth at bedtime.  . Ascorbic Acid (VITAMIN C) 1000 MG tablet Take 1,000 mg by mouth every evening.   Marland Kitchen aspirin EC 81 MG tablet Take 81 mg by mouth every evening.   . B Complex-Folic Acid (B COMPLEX-VITAMIN B12 PO) Take 1 tablet by mouth every evening.   . blood glucose meter kit and supplies KIT Dispense based on patient and insurance preference. Use up to four times daily as directed. (FOR ICD-9 250.00, 250.01).  . Cholecalciferol (VITAMIN D3) 50 MCG (2000 UT) capsule Take 50 mcg by mouth every evening.   . Coenzyme Q10 50 MG CAPS Take 1 capsule (50 mg total) by mouth daily.  Marland Kitchen gabapentin (NEURONTIN) 300 MG capsule Take 1 capsule (300 mg total) by mouth 3 (three) times daily as needed (neuropathy).  Marland Kitchen glucose blood (RELION TRUE METRIX TEST STRIPS) test strip Use as instructed to check blood sugar 2 times a day  . indomethacin (INDOCIN) 50 MG capsule TAKE 1 CAPSULE BY MOUTH THREE TIMES DAILY AS NEEDED (Patient taking differently: Take 50 mg by mouth 3 (three) times daily as needed for moderate pain. TAKE 1 CAPSULE BY MOUTH THREE TIMES DAILY AS NEEDED.)  . insulin isophane & regular human (NOVOLIN 70/30 FLEXPEN) (70-30) 100 UNIT/ML KwikPen Inject 10 Units into the skin daily. With breakfast  . Insulin Pen Needle (RELION PEN NEEDLES) 31G X 6 MM MISC Use as directed to check sugars twice daily  . metFORMIN  (GLUCOPHAGE) 500 MG tablet TAKE 1 TABLET (500 MG TOTAL) BY MOUTH DAILY WITH BREAKFAST AND 2 TABLETS (1,000 MG TOTAL) DAILY WITH SUPPER.  . metoprolol succinate (TOPROL-XL) 25 MG 24 hr tablet Take 1 tablet (25 mg total) by mouth at bedtime.  . Omega-3 Fatty Acids (FISH OIL) 1000 MG CAPS Take 1 capsule (1,000 mg total) by mouth 2 (two) times a day.  Marland Kitchen omeprazole (PRILOSEC) 20 MG capsule TAKE ONE CAPSULE BY MOUTH ONCE DAILY AS NEEDED  . rosuvastatin (CRESTOR) 5 MG tablet Take 1 tablet (5 mg total) by mouth daily.  . vitamin B-12 (CYANOCOBALAMIN) 1000 MCG tablet Take 1,000 mcg by mouth daily.  . [DISCONTINUED] lisinopril (PRINIVIL,ZESTRIL) 5 MG tablet Take 1 tablet (5 mg total) by mouth daily.   No facility-administered encounter medications on file as of 08/27/2020.    Current Diagnosis: Patient Active Problem List   Diagnosis Date Noted  . Diabetic cataract of both eyes ([place]) 07/12/2020  . PVD (peripheral vascular disease) ([place]) 09/10/2019  . Right wrist pain 10/13/2018  . Health maintenance examination 05/28/2017  . Skin rash 05/28/2017  . History of right hip replacement 03/26/2017  . OA (osteoarthritis) of hip 03/07/2017  . Thoracic aortic ectasia ([place]) 02/22/2017  . Type 2 diabetes mellitus with diabetic neuropathy, unspecified ([place]) 08/31/2016  . Obesity, Class I, BMI 30-34.9 05/14/2015  . Advanced care planning/counseling discussion 11/11/2014  .  Vitamin B12 deficiency 10/15/2012  . Medicare annual wellness visit, subsequent 10/02/2011  . GERD (gastroesophageal reflux disease) 10/02/2011  . Dyslipidemia associated with type 2 diabetes mellitus ([place]) 07/15/2009  . Gout 07/15/2009  . Type 2 diabetes mellitus with other specified complication ([place]) 85/01/7740  . Essential hypertension 06/23/2008    Goals Addressed   None     Follow-Up:  Coordination of Enhanced Pharmacy Services   Spoke with patient today regarding medication adherence. Patient has decided to stop taking  gabapentin. It is not helping and he doesn't want to stay on a medication like that if not needed. He also let me know that he has plenty of vitamins. His next adherence delivery will be mid December. We will revisit quantities of Vitamins at that time. He will likely not need these on next fill. I have communicated decision to stop gabapentin with Pharmacist.   Martinique Uselman, [place] Pharmacist Assistant  [phone]

## 2020-08-30 DIAGNOSIS — R69 Illness, unspecified: Secondary | ICD-10-CM | POA: Diagnosis not present

## 2020-09-03 ENCOUNTER — Encounter: Payer: Self-pay | Admitting: Family Medicine

## 2020-09-03 DIAGNOSIS — H2513 Age-related nuclear cataract, bilateral: Secondary | ICD-10-CM | POA: Diagnosis not present

## 2020-09-03 DIAGNOSIS — H43813 Vitreous degeneration, bilateral: Secondary | ICD-10-CM | POA: Diagnosis not present

## 2020-09-03 DIAGNOSIS — H04123 Dry eye syndrome of bilateral lacrimal glands: Secondary | ICD-10-CM | POA: Diagnosis not present

## 2020-09-03 DIAGNOSIS — H35033 Hypertensive retinopathy, bilateral: Secondary | ICD-10-CM | POA: Diagnosis not present

## 2020-09-03 LAB — HM DIABETES EYE EXAM

## 2020-09-16 ENCOUNTER — Telehealth: Payer: Self-pay

## 2020-09-16 NOTE — Chronic Care Management (AMB) (Signed)
Chronic Care Management Pharmacy Assistant   Name: Devin Huffman  MRN: [id number] DOB: August 15, 1946  Reason for Encounter: Medication Review/ Monthly Dispensing Call  Patient Questions:  1.  Have you seen any other providers since your last visit? No  2.  Any changes in your medicines or health? No    PCP : Ria Bush, MD  Allergies:   Allergies  Allergen Reactions  . Lisinopril Hives and Swelling    ?angioedema - lip swelling, hives    Medications: Outpatient Encounter Medications as of 09/16/2020  Medication Sig  . amLODipine (NORVASC) 5 MG tablet Take 1 tablet (5 mg total) by mouth at bedtime.  . Ascorbic Acid (VITAMIN C) 1000 MG tablet Take 1,000 mg by mouth every evening.   Marland Kitchen aspirin EC 81 MG tablet Take 81 mg by mouth every evening.   . B Complex-Folic Acid (B COMPLEX-VITAMIN B12 PO) Take 1 tablet by mouth every evening.   . blood glucose meter kit and supplies KIT Dispense based on patient and insurance preference. Use up to four times daily as directed. (FOR ICD-9 250.00, 250.01).  . Cholecalciferol (VITAMIN D3) 50 MCG (2000 UT) capsule Take 50 mcg by mouth every evening.   . Coenzyme Q10 50 MG CAPS Take 1 capsule (50 mg total) by mouth daily.  Marland Kitchen gabapentin (NEURONTIN) 300 MG capsule Take 1 capsule (300 mg total) by mouth 3 (three) times daily as needed (neuropathy).  Marland Kitchen glucose blood (RELION TRUE METRIX TEST STRIPS) test strip Use as instructed to check blood sugar 2 times a day  . indomethacin (INDOCIN) 50 MG capsule TAKE 1 CAPSULE BY MOUTH THREE TIMES DAILY AS NEEDED (Patient taking differently: Take 50 mg by mouth 3 (three) times daily as needed for moderate pain. TAKE 1 CAPSULE BY MOUTH THREE TIMES DAILY AS NEEDED.)  . insulin isophane & regular human (NOVOLIN 70/30 FLEXPEN) (70-30) 100 UNIT/ML KwikPen Inject 10 Units into the skin daily. With breakfast  . Insulin Pen Needle (RELION PEN NEEDLES) 31G X 6 MM MISC Use as directed to check sugars twice daily  .  metFORMIN (GLUCOPHAGE) 500 MG tablet TAKE 1 TABLET (500 MG TOTAL) BY MOUTH DAILY WITH BREAKFAST AND 2 TABLETS (1,000 MG TOTAL) DAILY WITH SUPPER.  . metoprolol succinate (TOPROL-XL) 25 MG 24 hr tablet Take 1 tablet (25 mg total) by mouth at bedtime.  . Omega-3 Fatty Acids (FISH OIL) 1000 MG CAPS Take 1 capsule (1,000 mg total) by mouth 2 (two) times a day.  Marland Kitchen omeprazole (PRILOSEC) 20 MG capsule TAKE ONE CAPSULE BY MOUTH ONCE DAILY AS NEEDED  . rosuvastatin (CRESTOR) 5 MG tablet Take 1 tablet (5 mg total) by mouth daily.  . vitamin B-12 (CYANOCOBALAMIN) 1000 MCG tablet Take 1,000 mcg by mouth daily.  . [DISCONTINUED] lisinopril (PRINIVIL,ZESTRIL) 5 MG tablet Take 1 tablet (5 mg total) by mouth daily.   No facility-administered encounter medications on file as of 09/16/2020.    Current Diagnosis: Patient Active Problem List   Diagnosis Date Noted  . Diabetic cataract of both eyes ([place]) 07/12/2020  . PVD (peripheral vascular disease) ([place]) 09/10/2019  . Right wrist pain 10/13/2018  . Health maintenance examination 05/28/2017  . Skin rash 05/28/2017  . History of right hip replacement 03/26/2017  . OA (osteoarthritis) of hip 03/07/2017  . Thoracic aortic ectasia ([place]) 02/22/2017  . Type 2 diabetes mellitus with diabetic neuropathy, unspecified ([place]) 08/31/2016  . Obesity, Class I, BMI 30-34.9 05/14/2015  . Advanced care planning/counseling discussion 11/11/2014  .  Vitamin B12 deficiency 10/15/2012  . Medicare annual wellness visit, subsequent 10/02/2011  . GERD (gastroesophageal reflux disease) 10/02/2011  . Dyslipidemia associated with type 2 diabetes mellitus ([place]) 07/15/2009  . Gout 07/15/2009  . Type 2 diabetes mellitus with other specified complication ([place]) 62/70/3500  . Essential hypertension 06/23/2008    Goals Addressed   None    Reviewed chart for medication changes ahead of medication coordination call.  No OVs, Consults, or hospital visits since last care coordination  call/Pharmacist visit.  No medication changes indicated in chart.  BP Readings from Last 3 Encounters:  07/12/20 126/76  04/12/20 128/76  02/10/20 120/70    Lab Results  Component Value Date   HGBA1C 7.2 (H) 07/05/2020     Patient obtains medications through Adherence Packaging  90 Days   Last adherence delivery included: Gabapentin 300 mg- 1 capsule three times a day as needed, Amlodipine 5 mg- 1 tablet daily, Metoprolol 25 mg- 1 tablet daily, Fish Oil 1000 mg- 1 capsule twice daily, Vitamin D3 50 mcg- 1 tablet daily, Vitamin C 1,000 mg - 1 tablet daily, Co-Q 10 100 mg- 1 capsule daily, Vitamin B 12  1000 mg- 1 tablet daily, Rosuvastatin 5 mg - 1 tablet daily, Metformin 500 mg- 1 tablet three times a day.  Patient declined True Metric Glucose test strips and Relion needles last month due to getting them OTC.   Patient is due for next adherence delivery on: 09/25/2020. Called patient and reviewed medications and coordinated delivery. Patient does not need any refills at this time.   This delivery to include: None- Patient does not need any medications at this time, received a 90 day supply adherence packaging system on 08/24/2020.   Patient declined the following medications: Gabapentin 300 mg- 1 capsule three times a day as needed, Amlodipine 5 mg- 1 tablet daily, Metoprolol 25 mg- 1 tablet daily, Fish Oil 1000 mg- 1 capsule twice daily, Vitamin D3 71mg- 1 tablet daily, Vitamin C 1,000 mg - 1 tablet daily, Co-Q 10 100 mg- 1 capsule daily, Vitamin B 12  1000 mg- 1 tablet daily, Rosuvastatin 5 mg - 1 tablet daily, Metformin 500 mg- 1 tablet three times a day due to receiving a 90 day supply on 08/24/20. True Metric Glucose test strips and Relion needles due to getting them OTC.    Patient aware that we will follow up with him regarding his medication needs next month.   Follow-Up:  Coordination of Enhanced Pharmacy Services and Pharmacist Review  Patient does not need any refills at this  time. SDonette Larry CPP notified.  TPattricia Boss CConversePharmacist Assistant [phone]

## 2020-10-13 ENCOUNTER — Telehealth: Payer: Self-pay

## 2020-10-13 NOTE — Progress Notes (Addendum)
Chronic Care Management Pharmacy Assistant   Name: Devin Huffman  MRN: [id number] DOB: 1946-05-05  Reason for Encounter: Medication Review  Patient Questions:  1.  Have you seen any other providers since your last visit? No  2.  Any changes in your medicines or health? No   Devin Huffman,  74 y.o. , male presents for their Follow-Up CCM visit with the clinical pharmacist via telephone.  PCP : Ria Bush, MD  Allergies:   Allergies  Allergen Reactions   Lisinopril Hives and Swelling    ?angioedema - lip swelling, hives    Medications: Outpatient Encounter Medications as of 10/13/2020  Medication Sig   amLODipine (NORVASC) 5 MG tablet Take 1 tablet (5 mg total) by mouth at bedtime.   Ascorbic Acid (VITAMIN C) 1000 MG tablet Take 1,000 mg by mouth every evening.    aspirin EC 81 MG tablet Take 81 mg by mouth every evening.    B Complex-Folic Acid (B COMPLEX-VITAMIN B12 PO) Take 1 tablet by mouth every evening.    blood glucose meter kit and supplies KIT Dispense based on patient and insurance preference. Use up to four times daily as directed. (FOR ICD-9 250.00, 250.01).   Cholecalciferol (VITAMIN D3) 50 MCG (2000 UT) capsule Take 50 mcg by mouth every evening.    Coenzyme Q10 50 MG CAPS Take 1 capsule (50 mg total) by mouth daily.   gabapentin (NEURONTIN) 300 MG capsule Take 1 capsule (300 mg total) by mouth 3 (three) times daily as needed (neuropathy).   glucose blood (RELION TRUE METRIX TEST STRIPS) test strip Use as instructed to check blood sugar 2 times a day   indomethacin (INDOCIN) 50 MG capsule TAKE 1 CAPSULE BY MOUTH THREE TIMES DAILY AS NEEDED (Patient taking differently: Take 50 mg by mouth 3 (three) times daily as needed for moderate pain. TAKE 1 CAPSULE BY MOUTH THREE TIMES DAILY AS NEEDED.)   insulin isophane & regular human (NOVOLIN 70/30 FLEXPEN) (70-30) 100 UNIT/ML KwikPen Inject 10 Units into the skin daily. With breakfast   Insulin Pen Needle (RELION  PEN NEEDLES) 31G X 6 MM MISC Use as directed to check sugars twice daily   metFORMIN (GLUCOPHAGE) 500 MG tablet TAKE 1 TABLET (500 MG TOTAL) BY MOUTH DAILY WITH BREAKFAST AND 2 TABLETS (1,000 MG TOTAL) DAILY WITH SUPPER.   metoprolol succinate (TOPROL-XL) 25 MG 24 hr tablet Take 1 tablet (25 mg total) by mouth at bedtime.   Omega-3 Fatty Acids (FISH OIL) 1000 MG CAPS Take 1 capsule (1,000 mg total) by mouth 2 (two) times a day.   omeprazole (PRILOSEC) 20 MG capsule TAKE ONE CAPSULE BY MOUTH ONCE DAILY AS NEEDED   rosuvastatin (CRESTOR) 5 MG tablet Take 1 tablet (5 mg total) by mouth daily.   vitamin B-12 (CYANOCOBALAMIN) 1000 MCG tablet Take 1,000 mcg by mouth daily.   [DISCONTINUED] lisinopril (PRINIVIL,ZESTRIL) 5 MG tablet Take 1 tablet (5 mg total) by mouth daily.   No facility-administered encounter medications on file as of 10/13/2020.    Current Diagnosis: Patient Active Problem List   Diagnosis Date Noted   Diabetic cataract of both eyes ([place]) 07/12/2020   PVD (peripheral vascular disease) ([place]) 09/10/2019   Right wrist pain 10/13/2018   Health maintenance examination 05/28/2017   Skin rash 05/28/2017   History of right hip replacement 03/26/2017   OA (osteoarthritis) of hip 03/07/2017   Thoracic aortic ectasia ([place]) 02/22/2017   Type 2 diabetes mellitus with diabetic neuropathy, unspecified (  [place]) 08/31/2016   Obesity, Class I, BMI 30-34.9 05/14/2015   Advanced care planning/counseling discussion 11/11/2014   Vitamin B12 deficiency 10/15/2012   Medicare annual wellness visit, subsequent 10/02/2011   GERD (gastroesophageal reflux disease) 10/02/2011   Dyslipidemia associated with type 2 diabetes mellitus ([place]) 07/15/2009   Gout 07/15/2009   Type 2 diabetes mellitus with other specified complication ([place]) 32/95/1884   Essential hypertension 06/23/2008    Follow-Up:  Coordination of Enhanced Pharmacy Services  .Marland Kitchen Reviewed chart for medication changes ahead of medication  coordination call.  No OVs, Consults, or hospital visits since last care coordination call/Pharmacist visit.   No medication changes indicated.  BP Readings from Last 3 Encounters:  07/12/20 126/76  04/12/20 128/76  02/10/20 120/70    Lab Results  Component Value Date   HGBA1C 7.2 (H) 07/05/2020    Patient obtains medications through Adherence Packaging  90 Days   Last adherence delivery included: 90 day supply on 08/24/2020 Amlodipine 5 mg- 1 tablet daily Fish Oil 1000 mg- 1 capsule twice daily Vitamin D3 62mg- 1 tablet daily Vitamin C 1,000 mg - 1 tablet daily Co-Q 10 100 mg- 1 capsule daily Vitamin B 12  1000 mg- 1 tablet daily Metformin 500 mg- 1 tablet three times  Metoprolol 25 mg- 1 tablet daily Rosuvastatin 5 mg - 1 tablet daily  Patient is due for next adherence delivery on: 11/23/2020  Called patient and reviewed medications. Patient does not need any refills at this time.  This delivery to include: None  Patient declined the following medications: Maintenance meds received on 08/24/20 90 DS Gabapentin 300 mg- 1 capsule three times a day as needed True Metric Glucose test strips and Relion needles due to getting them OTC  Patient aware that we will follow up with him regarding his medication next month.    I have reviewed the care management and care coordination activities outlined in this encounter and I am certifying that I agree with the content of this note. No further action required.  MDebbora Dus PharmD Clinical Pharmacist [phone]

## 2020-11-09 ENCOUNTER — Telehealth: Payer: Self-pay

## 2020-11-09 NOTE — Progress Notes (Signed)
Chronic Care Management Pharmacy Assistant   Name: EDI GORNIAK  MRN: [id number] DOB: 08-06-1946  Reason for Encounter: Medication Review  Patient Questions:  1.  Have you seen any other providers since your last visit? No  2.  Any changes in your medicines or health? No   Devin Huffman,  74 y.o. , male .  PCP : Ria Bush, MD  Allergies:   Allergies  Allergen Reactions   Lisinopril Hives and Swelling    ?angioedema - lip swelling, hives    Medications: Outpatient Encounter Medications as of 11/09/2020  Medication Sig   amLODipine (NORVASC) 5 MG tablet Take 1 tablet (5 mg total) by mouth at bedtime.   Ascorbic Acid (VITAMIN C) 1000 MG tablet Take 1,000 mg by mouth every evening.    aspirin EC 81 MG tablet Take 81 mg by mouth every evening.    B Complex-Folic Acid (B COMPLEX-VITAMIN B12 PO) Take 1 tablet by mouth every evening.    blood glucose meter kit and supplies KIT Dispense based on patient and insurance preference. Use up to four times daily as directed. (FOR ICD-9 250.00, 250.01).   Cholecalciferol (VITAMIN D3) 50 MCG (2000 UT) capsule Take 50 mcg by mouth every evening.    Coenzyme Q10 50 MG CAPS Take 1 capsule (50 mg total) by mouth daily.   gabapentin (NEURONTIN) 300 MG capsule Take 1 capsule (300 mg total) by mouth 3 (three) times daily as needed (neuropathy).   glucose blood (RELION TRUE METRIX TEST STRIPS) test strip Use as instructed to check blood sugar 2 times a day   indomethacin (INDOCIN) 50 MG capsule TAKE 1 CAPSULE BY MOUTH THREE TIMES DAILY AS NEEDED (Patient taking differently: Take 50 mg by mouth 3 (three) times daily as needed for moderate pain. TAKE 1 CAPSULE BY MOUTH THREE TIMES DAILY AS NEEDED.)   insulin isophane & regular human (NOVOLIN 70/30 FLEXPEN) (70-30) 100 UNIT/ML KwikPen Inject 10 Units into the skin daily. With breakfast   Insulin Pen Needle (RELION PEN NEEDLES) 31G X 6 MM MISC Use as directed to check sugars twice  daily   metFORMIN (GLUCOPHAGE) 500 MG tablet TAKE 1 TABLET (500 MG TOTAL) BY MOUTH DAILY WITH BREAKFAST AND 2 TABLETS (1,000 MG TOTAL) DAILY WITH SUPPER.   metoprolol succinate (TOPROL-XL) 25 MG 24 hr tablet Take 1 tablet (25 mg total) by mouth at bedtime.   Omega-3 Fatty Acids (FISH OIL) 1000 MG CAPS Take 1 capsule (1,000 mg total) by mouth 2 (two) times a day.   omeprazole (PRILOSEC) 20 MG capsule TAKE ONE CAPSULE BY MOUTH ONCE DAILY AS NEEDED   rosuvastatin (CRESTOR) 5 MG tablet Take 1 tablet (5 mg total) by mouth daily.   vitamin B-12 (CYANOCOBALAMIN) 1000 MCG tablet Take 1,000 mcg by mouth daily.   [DISCONTINUED] lisinopril (PRINIVIL,ZESTRIL) 5 MG tablet Take 1 tablet (5 mg total) by mouth daily.   No facility-administered encounter medications on file as of 11/09/2020.    Current Diagnosis: Patient Active Problem List   Diagnosis Date Noted   Diabetic cataract of both eyes ([place]) 07/12/2020   PVD (peripheral vascular disease) ([place]) 09/10/2019   Right wrist pain 10/13/2018   Health maintenance examination 05/28/2017   Skin rash 05/28/2017   History of right hip replacement 03/26/2017   OA (osteoarthritis) of hip 03/07/2017   Thoracic aortic ectasia ([place]) 02/22/2017   Type 2 diabetes mellitus with diabetic neuropathy, unspecified ([place]) 08/31/2016   Obesity, Class I, BMI 30-34.9 05/14/2015  Advanced care planning/counseling discussion 11/11/2014   Vitamin B12 deficiency 10/15/2012   Medicare annual wellness visit, subsequent 10/02/2011   GERD (gastroesophageal reflux disease) 10/02/2011   Dyslipidemia associated with type 2 diabetes mellitus ([place]) 07/15/2009   Gout 07/15/2009   Type 2 diabetes mellitus with other specified complication ([place]) 62/37/6283   Essential hypertension 06/23/2008    Goals Addressed   None     Follow-Up:  Pharmacist Review   Reviewed chart and adherence measures. Per insurance data, pt is 100% compliant on Rosuvastatin Tab  5 mg.  Percival Spanish, PharmD, notified.  [employer] CMA, [place]

## 2020-11-15 ENCOUNTER — Telehealth: Payer: Self-pay

## 2020-11-15 MED ORDER — METFORMIN HCL 500 MG PO TABS: ORAL_TABLET | ORAL | 0 refills | Status: DC

## 2020-11-15 NOTE — Telephone Encounter (Signed)
E-scribed refill 

## 2020-11-15 NOTE — Chronic Care Management (AMB) (Signed)
Chronic Care Management Pharmacy Assistant   Name: Devin Huffman  MRN: [id number] DOB: [birth year]  Reason for Encounter: Medication Review  Patient Questions:  1.  Have you seen any other providers since your last visit? No  2.  Any changes in your medicines or health? No     PCP : Ria Bush, MD  Allergies:   Allergies  Allergen Reactions  . Lisinopril Hives and Swelling    ?angioedema - lip swelling, hives    Medications: Outpatient Encounter Medications as of 11/15/2020  Medication Sig  . amLODipine (NORVASC) 5 MG tablet Take 1 tablet (5 mg total) by mouth at bedtime.  . Ascorbic Acid (VITAMIN C) 1000 MG tablet Take 1,000 mg by mouth every evening.   Marland Kitchen aspirin EC 81 MG tablet Take 81 mg by mouth every evening.   . B Complex-Folic Acid (B COMPLEX-VITAMIN B12 PO) Take 1 tablet by mouth every evening.   . blood glucose meter kit and supplies KIT Dispense based on patient and insurance preference. Use up to four times daily as directed. (FOR ICD-9 250.00, 250.01).  . Cholecalciferol (VITAMIN D3) 50 MCG (2000 UT) capsule Take 50 mcg by mouth every evening.   . Coenzyme Q10 50 MG CAPS Take 1 capsule (50 mg total) by mouth daily.  Marland Kitchen gabapentin (NEURONTIN) 300 MG capsule Take 1 capsule (300 mg total) by mouth 3 (three) times daily as needed (neuropathy).  Marland Kitchen glucose blood (RELION TRUE METRIX TEST STRIPS) test strip Use as instructed to check blood sugar 2 times a day  . indomethacin (INDOCIN) 50 MG capsule TAKE 1 CAPSULE BY MOUTH THREE TIMES DAILY AS NEEDED (Patient taking differently: Take 50 mg by mouth 3 (three) times daily as needed for moderate pain. TAKE 1 CAPSULE BY MOUTH THREE TIMES DAILY AS NEEDED.)  . insulin isophane & regular human (NOVOLIN 70/30 FLEXPEN) (70-30) 100 UNIT/ML KwikPen Inject 10 Units into the skin daily. With breakfast  . Insulin Pen Needle (RELION PEN NEEDLES) 31G X 6 MM MISC Use as directed to check sugars twice daily  . metFORMIN (GLUCOPHAGE)  500 MG tablet TAKE 1 TABLET (500 MG TOTAL) BY MOUTH DAILY WITH BREAKFAST AND 2 TABLETS (1,000 MG TOTAL) DAILY WITH SUPPER.  . metoprolol succinate (TOPROL-XL) 25 MG 24 hr tablet Take 1 tablet (25 mg total) by mouth at bedtime.  . Omega-3 Fatty Acids (FISH OIL) 1000 MG CAPS Take 1 capsule (1,000 mg total) by mouth 2 (two) times a day.  Marland Kitchen omeprazole (PRILOSEC) 20 MG capsule TAKE ONE CAPSULE BY MOUTH ONCE DAILY AS NEEDED  . rosuvastatin (CRESTOR) 5 MG tablet Take 1 tablet (5 mg total) by mouth daily.  . vitamin B-12 (CYANOCOBALAMIN) 1000 MCG tablet Take 1,000 mcg by mouth daily.  . [DISCONTINUED] lisinopril (PRINIVIL,ZESTRIL) 5 MG tablet Take 1 tablet (5 mg total) by mouth daily.   No facility-administered encounter medications on file as of 11/15/2020.    Current Diagnosis: Patient Active Problem List   Diagnosis Date Noted  . Diabetic cataract of both eyes ([place]) 07/12/2020  . PVD (peripheral vascular disease) ([place]) 09/10/2019  . Right wrist pain 10/13/2018  . Health maintenance examination 05/28/2017  . Skin rash 05/28/2017  . History of right hip replacement 03/26/2017  . OA (osteoarthritis) of hip 03/07/2017  . Thoracic aortic ectasia ([place]) 02/22/2017  . Type 2 diabetes mellitus with diabetic neuropathy, unspecified ([place]) 08/31/2016  . Obesity, Class I, BMI 30-34.9 05/14/2015  . Advanced care planning/counseling discussion 11/11/2014  .  Vitamin B12 deficiency 10/15/2012  . Medicare annual wellness visit, subsequent 10/02/2011  . GERD (gastroesophageal reflux disease) 10/02/2011  . Dyslipidemia associated with type 2 diabetes mellitus ([place]) 07/15/2009  . Gout 07/15/2009  . Type 2 diabetes mellitus with other specified complication ([place]) 87/68/1157  . Essential hypertension 06/23/2008   Reviewed chart for medication changes ahead of medication coordination call.  No OVs, Consults, or hospital visits since last care coordination call/Pharmacist visit.   No medication changes  indicated.  BP Readings from Last 3 Encounters:  07/12/20 126/76  04/12/20 128/76  02/10/20 120/70    Lab Results  Component Value Date   HGBA1C 7.2 (H) 07/05/2020     Patient obtains medications through Vials  90 Days   Last adherence delivery included: NONE due to 90 ds  Patient declined the following medications last month: Gabapentin 300 mg- 1 capsule three times a day as needed Amlodipine 5 mg- 1 tablet daily Metoprolol 25 mg- 1 tablet daily Fish Oil 1000 mg- 1 capsule twice daily Vitamin D3 50 mcg- 1 tablet daily Vitamin C 1,000 mg - 1 tablet daily Co-Q 10 100 mg- 1 capsule daily Vitamin B 12 1000 mg- 1 tablet daily Rosuvastatin 5 mg - 1 tablet daily Metformin 500 mg- 1 tablet three times a day due to receiving a 90 day supply on 08/24/20 True Metric Glucose test strips Relion needlesdue to getting them OTC.   Patient is due for next adherence delivery on: 11/19/20  Called patient and reviewed medications and coordinated delivery.  This delivery to include: Amlodipine 5 mg- 1 tablet daily Metoprolol 25 mg- 1 tablet daily Vitamin C 1000 mg- 1 tablet every evening Rosuvastatin 5 mg - 1 tablet daily   Patient does not need short fill or acute fill of any medications.    Patient declined the following medications  Metformin 500 mg- 1 tablet three times a day - States he has full bottle left. Fish Oil 1000 mg - gets OTC Vitamin d3 50 mcg- gets OTC CoQ 10 100 mg- gets OTC Vitamin B 12 1000 mcg- gets OTC Gabapentin 300 mg- 1 capsule three times a day as needed True Metric Glucose test strips "has plenty" Uses sparingly Relion needlesdue to getting them OTC.  Gabapentin 100 mg - no longer taking dosage Novolin 70-30 flex pen - [facility]  Patient does not need refills for any medications.  Confirmed delivery date of 11/19/20, advised patient that pharmacy will contact them the morning of delivery.  Follow-Up:  Equities trader and Pharmacist Review   Debbora Dus, CPP notified  Margaretmary Dys, [place] Pharmacy Assistant [phone]

## 2020-11-15 NOTE — Telephone Encounter (Signed)
-----   Message from [place], [facility] sent at 11/15/2020  9:36 AM EST ----- Regarding: Refill The following medication refills are needed: metformin  Please send refills to [facility].  Thank you,  Debbora Dus, PharmD Clinical Pharmacist [place] [facility] at [facility] [phone]

## 2020-12-09 ENCOUNTER — Telehealth: Payer: Self-pay

## 2020-12-09 NOTE — Chronic Care Management (AMB) (Addendum)
Chronic Care Management Pharmacy Assistant   Name: Devin Huffman  MRN: [id number] DOB: 01-Jan-1946  Reason for Encounter: Medication Review- adherence review  Reviewed chart and attempted to review adherence measures. No insurance data available on cholesterol, hypertension or diabetes medication adherence.  PCP : Ria Bush, MD  Allergies:   Allergies  Allergen Reactions   Lisinopril Hives and Swelling    ?angioedema - lip swelling, hives    Medications: Outpatient Encounter Medications as of 12/09/2020  Medication Sig   amLODipine (NORVASC) 5 MG tablet Take 1 tablet (5 mg total) by mouth at bedtime.   Ascorbic Acid (VITAMIN C) 1000 MG tablet Take 1,000 mg by mouth every evening.    aspirin EC 81 MG tablet Take 81 mg by mouth every evening.    B Complex-Folic Acid (B COMPLEX-VITAMIN B12 PO) Take 1 tablet by mouth every evening.    blood glucose meter kit and supplies KIT Dispense based on patient and insurance preference. Use up to four times daily as directed. (FOR ICD-9 250.00, 250.01).   Cholecalciferol (VITAMIN D3) 50 MCG (2000 UT) capsule Take 50 mcg by mouth every evening.    Coenzyme Q10 50 MG CAPS Take 1 capsule (50 mg total) by mouth daily.   gabapentin (NEURONTIN) 300 MG capsule Take 1 capsule (300 mg total) by mouth 3 (three) times daily as needed (neuropathy).   glucose blood (RELION TRUE METRIX TEST STRIPS) test strip Use as instructed to check blood sugar 2 times a day   indomethacin (INDOCIN) 50 MG capsule TAKE 1 CAPSULE BY MOUTH THREE TIMES DAILY AS NEEDED (Patient taking differently: Take 50 mg by mouth 3 (three) times daily as needed for moderate pain. TAKE 1 CAPSULE BY MOUTH THREE TIMES DAILY AS NEEDED.)   insulin isophane & regular human (NOVOLIN 70/30 FLEXPEN) (70-30) 100 UNIT/ML KwikPen Inject 10 Units into the skin daily. With breakfast   Insulin Pen Needle (RELION PEN NEEDLES) 31G X 6 MM MISC Use as directed to check sugars twice daily   metFORMIN  (GLUCOPHAGE) 500 MG tablet Take 1 tablet (500 mg total) by mouth daily with breakfast AND 2 tablets (1,000 mg total) daily with supper.   metoprolol succinate (TOPROL-XL) 25 MG 24 hr tablet Take 1 tablet (25 mg total) by mouth at bedtime.   Omega-3 Fatty Acids (FISH OIL) 1000 MG CAPS Take 1 capsule (1,000 mg total) by mouth 2 (two) times a day.   omeprazole (PRILOSEC) 20 MG capsule TAKE ONE CAPSULE BY MOUTH ONCE DAILY AS NEEDED   rosuvastatin (CRESTOR) 5 MG tablet Take 1 tablet (5 mg total) by mouth daily.   vitamin B-12 (CYANOCOBALAMIN) 1000 MCG tablet Take 1,000 mcg by mouth daily.   [DISCONTINUED] lisinopril (PRINIVIL,ZESTRIL) 5 MG tablet Take 1 tablet (5 mg total) by mouth daily.   No facility-administered encounter medications on file as of 12/09/2020.    Current Diagnosis: Patient Active Problem List   Diagnosis Date Noted   Diabetic cataract of both eyes ([place]) 07/12/2020   PVD (peripheral vascular disease) ([place]) 09/10/2019   Right wrist pain 10/13/2018   Health maintenance examination 05/28/2017   Skin rash 05/28/2017   History of right hip replacement 03/26/2017   OA (osteoarthritis) of hip 03/07/2017   Thoracic aortic ectasia ([place]) 02/22/2017   Type 2 diabetes mellitus with diabetic neuropathy, unspecified ([place]) 08/31/2016   Obesity, Class I, BMI 30-34.9 05/14/2015   Advanced care planning/counseling discussion 11/11/2014   Vitamin B12 deficiency 10/15/2012   Medicare annual wellness  visit, subsequent 10/02/2011   GERD (gastroesophageal reflux disease) 10/02/2011   Dyslipidemia associated with type 2 diabetes mellitus ([place]) 07/15/2009   Gout 07/15/2009   Type 2 diabetes mellitus with other specified complication ([place]) 58/59/2924   Essential hypertension 06/23/2008    Follow-Up:  Pharmacist Review   Margaretmary Dys, [place] Assistant [phone]  Reviewed chart and insurance data for medication adherence. Statin refills timely, < 5 day gap. Pt is > 5  day past due for metformin. Reported he has a full bottle of metformin at last adherence call. Patient confirms taking as prescribed. Will check to see if bottle is expired. Re-sync meds.   Debbora Dus, PharmD Clinical Pharmacist [place] [facility] at [facility] [phone]

## 2020-12-16 ENCOUNTER — Telehealth: Payer: Medicare HMO

## 2020-12-22 ENCOUNTER — Telehealth: Payer: Self-pay

## 2020-12-22 NOTE — Chronic Care Management (AMB) (Addendum)
Chronic Care Management Pharmacy Assistant   Name: Devin Huffman  MRN: [id number] DOB: 1946-11-28  Reason for Encounter: Medication Review  Patient Questions:  1.  Have you seen any other providers since your last visit? No  2.  Any changes in your medicines or health? No   PCP : Ria Bush, MD  Allergies:   Allergies  Allergen Reactions   Lisinopril Hives and Swelling    ?angioedema - lip swelling, hives    Medications: Outpatient Encounter Medications as of 12/22/2020  Medication Sig   amLODipine (NORVASC) 5 MG tablet Take 1 tablet (5 mg total) by mouth at bedtime.   Ascorbic Acid (VITAMIN C) 1000 MG tablet Take 1,000 mg by mouth every evening.    aspirin EC 81 MG tablet Take 81 mg by mouth every evening.    B Complex-Folic Acid (B COMPLEX-VITAMIN B12 PO) Take 1 tablet by mouth every evening.    blood glucose meter kit and supplies KIT Dispense based on patient and insurance preference. Use up to four times daily as directed. (FOR ICD-9 250.00, 250.01).   Cholecalciferol (VITAMIN D3) 50 MCG (2000 UT) capsule Take 50 mcg by mouth every evening.    Coenzyme Q10 50 MG CAPS Take 1 capsule (50 mg total) by mouth daily.   gabapentin (NEURONTIN) 300 MG capsule Take 1 capsule (300 mg total) by mouth 3 (three) times daily as needed (neuropathy).   glucose blood (RELION TRUE METRIX TEST STRIPS) test strip Use as instructed to check blood sugar 2 times a day   indomethacin (INDOCIN) 50 MG capsule TAKE 1 CAPSULE BY MOUTH THREE TIMES DAILY AS NEEDED (Patient taking differently: Take 50 mg by mouth 3 (three) times daily as needed for moderate pain. TAKE 1 CAPSULE BY MOUTH THREE TIMES DAILY AS NEEDED.)   insulin isophane & regular human (NOVOLIN 70/30 FLEXPEN) (70-30) 100 UNIT/ML KwikPen Inject 10 Units into the skin daily. With breakfast   Insulin Pen Needle (RELION PEN NEEDLES) 31G X 6 MM MISC Use as directed to check sugars twice daily   metFORMIN (GLUCOPHAGE) 500 MG tablet Take  1 tablet (500 mg total) by mouth daily with breakfast AND 2 tablets (1,000 mg total) daily with supper.   metoprolol succinate (TOPROL-XL) 25 MG 24 hr tablet Take 1 tablet (25 mg total) by mouth at bedtime.   Omega-3 Fatty Acids (FISH OIL) 1000 MG CAPS Take 1 capsule (1,000 mg total) by mouth 2 (two) times a day.   omeprazole (PRILOSEC) 20 MG capsule TAKE ONE CAPSULE BY MOUTH ONCE DAILY AS NEEDED   rosuvastatin (CRESTOR) 5 MG tablet Take 1 tablet (5 mg total) by mouth daily.   vitamin B-12 (CYANOCOBALAMIN) 1000 MCG tablet Take 1,000 mcg by mouth daily.   [DISCONTINUED] lisinopril (PRINIVIL,ZESTRIL) 5 MG tablet Take 1 tablet (5 mg total) by mouth daily.   No facility-administered encounter medications on file as of 12/22/2020.    Current Diagnosis: Patient Active Problem List   Diagnosis Date Noted   Diabetic cataract of both eyes ([place]) 07/12/2020   PVD (peripheral vascular disease) ([place]) 09/10/2019   Right wrist pain 10/13/2018   Health maintenance examination 05/28/2017   Skin rash 05/28/2017   History of right hip replacement 03/26/2017   OA (osteoarthritis) of hip 03/07/2017   Thoracic aortic ectasia ([place]) 02/22/2017   Type 2 diabetes mellitus with diabetic neuropathy, unspecified ([place]) 08/31/2016   Obesity, Class I, BMI 30-34.9 05/14/2015   Advanced care planning/counseling discussion 11/11/2014   Vitamin B12  deficiency 10/15/2012   Medicare annual wellness visit, subsequent 10/02/2011   GERD (gastroesophageal reflux disease) 10/02/2011   Dyslipidemia associated with type 2 diabetes mellitus ([place]) 07/15/2009   Gout 07/15/2009   Type 2 diabetes mellitus with other specified complication ([place]) 17/91/5056   Essential hypertension 06/23/2008   Reviewed chart for medication changes ahead of medication coordination call.  No OVs, Consults, or hospital visits since last care coordination call/Pharmacist visit.  No medication changes indicated.  BP Readings from Last 3 Encounters:   07/12/20 126/76  04/12/20 128/76  02/10/20 120/70    Lab Results  Component Value Date   HGBA1C 7.2 (H) 07/05/2020      Patient obtains medications through Vials  90 Days   Last adherence delivery included:  11/18/20 90 DS Amlodipine 5 mg- 1 tablet daily Metoprolol 25 mg- 1 tablet daily Vitamin C 1000 mg- 1 tablet every evening Rosuvastatin 5 mg - 1 tablet daily  Patient declined the following medications last month: Metformin 500 mg- 1 tablet three times a day - States he has full bottle left. Fish Oil 1000 mg - gets OTC Vitamin d3 50 mcg- gets OTC CoQ 10 100 mg- gets OTC Vitamin B 12 1000 mcg- gets OTC Gabapentin 300 mg- 1 capsule three times a day as needed True Metric Glucose test strips "has plenty" Uses sparingly Relion needles due to getting them OTC.  Gabapentin 100 mg - no longer taking dosage Novolin 70-30 flex pen - Never filled through [facility]  Called patient and reviewed medications and coordinated delivery.  This delivery to include: 12/22/2020, sync to other medications  Vitamin B12 1000 mcg - 1 tablet daily  Metformin 500 mg - 1 tablet three times daily  Patient declined the following medications: Amlodipine 5 mg- 1 tablet daily - 90 DS on 11/18/20 CoQ 10 100 mg- gets OTC Fish Oil 1000 mg - gets OTC Metoprolol 25 mg- 1 tablet daily- 90 DS 11/18/20 Novolin 70-30 flex pen - Never filled through [facility]- patient states "he has not needed to use this in a while" Relion needles due to getting them OTC.  Rosuvastatin 5 mg - 1 tablet daily- 90 DS on 11/18/20 True Metric Glucose test strips "has plenty" Uses sparingly Vitamin C 1000 mg- 1 tablet every evening - 90 DS 11/18/20 Vitamin D3 50 mcg- gets OTC  Patient needs refills for no medications.  States he has not taken gabapentin nor Novolin in quite a while and has not needed to. Patient is not home to review detailed readings but notes that blood sugar was 160 this morning 12/22/20 - fasting. He states blood  sugars usually range from 140-165 fasting.  Follow-Up:  Coordination of Enhanced Pharmacy Services and Pharmacist Review   Debbora Dus, CPP notified  Margaretmary Dys, [place] [phone]  Pt lost to follow up for CCM visits. Scheduled visit with me for March 2022 as he has a PCP visit on 01/12/21. Would like to review BG log and Novolog adherence. CMA will assess adherence and BG log at February adherence call.   Debbora Dus, PharmD Clinical Pharmacist [facility] [phone]

## 2021-01-12 ENCOUNTER — Ambulatory Visit (INDEPENDENT_AMBULATORY_CARE_PROVIDER_SITE_OTHER): Payer: Medicare HMO | Admitting: Family Medicine

## 2021-01-12 ENCOUNTER — Telehealth: Payer: Self-pay

## 2021-01-12 ENCOUNTER — Encounter: Payer: Self-pay | Admitting: Family Medicine

## 2021-01-12 ENCOUNTER — Other Ambulatory Visit: Payer: Self-pay

## 2021-01-12 VITALS — BP 162/80 | HR 70 | Temp 97.6°F | Ht 70.0 in | Wt 236.0 lb

## 2021-01-12 DIAGNOSIS — E785 Hyperlipidemia, unspecified: Secondary | ICD-10-CM | POA: Diagnosis not present

## 2021-01-12 DIAGNOSIS — E1140 Type 2 diabetes mellitus with diabetic neuropathy, unspecified: Secondary | ICD-10-CM

## 2021-01-12 DIAGNOSIS — I10 Essential (primary) hypertension: Secondary | ICD-10-CM | POA: Diagnosis not present

## 2021-01-12 DIAGNOSIS — Z23 Encounter for immunization: Secondary | ICD-10-CM

## 2021-01-12 DIAGNOSIS — E1169 Type 2 diabetes mellitus with other specified complication: Secondary | ICD-10-CM | POA: Diagnosis not present

## 2021-01-12 DIAGNOSIS — Z794 Long term (current) use of insulin: Secondary | ICD-10-CM | POA: Diagnosis not present

## 2021-01-12 LAB — POCT GLYCOSYLATED HEMOGLOBIN (HGB A1C): Hemoglobin A1C: 6.8 % — AB (ref 4.0–5.6)

## 2021-01-12 MED ORDER — AMLODIPINE BESYLATE 10 MG PO TABS
10.0000 mg | ORAL_TABLET | Freq: Every day | ORAL | 3 refills | Status: DC
Start: 2021-01-12 — End: 2022-01-25

## 2021-01-12 MED ORDER — INDOMETHACIN 50 MG PO CAPS
50.0000 mg | ORAL_CAPSULE | Freq: Three times a day (TID) | ORAL | 0 refills | Status: DC | PRN
Start: 2021-01-12 — End: 2022-05-17

## 2021-01-12 NOTE — Progress Notes (Signed)
Patient ID: MCGUIRE GASPARYAN, male    DOB: 05/22/1946, 75 y.o.   MRN: [id number]  This visit was conducted in person.  BP (!) 162/80 (BP Location: Right Arm, Patient Position: Sitting, Cuff Size: Large)   Pulse 70   Temp 97.6 F (36.4 C) (Temporal)   Ht 5' 10" (1.778 m)   Wt 236 lb (107 kg)   SpO2 95%   BMI 33.86 kg/m   BP 180/86 on repeat CC: 6 mo f/u visit  Subjective:   HPI: Devin Huffman is a 75 y.o. male presenting on 01/12/2021 for Follow-up (Here for 6 mo f/u.)   Wants to get COVID booster. 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   HTN - Compliant with current antihypertensive regimen of amlodipine 96m daily, toprol XL 278m Lisinopril may have previously caused angioedema.  Does not check blood pressures at home. No low blood pressure readings or symptoms of dizziness/syncope.  Denies HA, vision changes, CP/tightness, SOB, leg swelling. No increased salt in diet.   DM - does intermittently check sugars fasting 160. He did recently start eating vanilla puddings, jello (sugared). Compliant with antihyperglycemic regimen which includes: metformin 500/1000mg daily. He's not been taking novolin 70/30 (this was started after COVID infection last year). Denies low sugars or hypoglycemic symptoms. Denies paresthesias. Does have burning discomfort to toes worse at night - didn't feel gabapentin 300 TID was effective. Last diabetic eye exam 08/2020. Pneumovax: 2013. Prevnar: 2015. Glucometer brand: relion. DSME: declines. No alcohol use. Continues b12 supplementation.  Lab Results  Component Value Date   HGBA1C 6.8 (A) 01/12/2021   Diabetic Foot Exam - Simple   Simple Foot Form Diabetic Foot exam was performed with the following findings: Yes 01/12/2021  9:56 AM  Visual Inspection See comments: Yes Sensation Testing Intact to touch and monofilament testing bilaterally: Yes Pulse Check Posterior Tibialis and Dorsalis pulse intact bilaterally: Yes Comments Thickened onychomycotic nails  R1/3, L1/4 Dry skin to soles, callus formation medial 1st MTPJ    Lab Results  Component Value Date   MICROALBUR 1.0 07/12/2020    Lab Results  Component Value Date   [id number] 7677/26/2021        Relevant past medical, surgical, family and social history reviewed and updated as indicated. Interim medical history since our last visit reviewed. Allergies and medications reviewed and updated. Outpatient Medications Prior to Visit  Medication Sig Dispense Refill  . Ascorbic Acid (VITAMIN C) 1000 MG tablet Take 1,000 mg by mouth every evening.     . Marland Kitchenspirin EC 81 MG tablet Take 81 mg by mouth every evening.     . B Complex-Folic Acid (B COMPLEX-VITAMIN B12 PO) Take 1 tablet by mouth every evening.     . blood glucose meter kit and supplies KIT Dispense based on patient and insurance preference. Use up to four times daily as directed. (FOR ICD-9 250.00, 250.01). 1 each 0  . Cholecalciferol (VITAMIN D3) 50 MCG (2000 UT) capsule Take 50 mcg by mouth every evening.     . Coenzyme Q10 50 MG CAPS Take 1 capsule (50 mg total) by mouth daily.  0  . glucose blood (RELION TRUE METRIX TEST STRIPS) test strip Use as instructed to check blood sugar 2 times a day 100 each 3  . Insulin Pen Needle (RELION PEN NEEDLES) 31G X 6 MM MISC Use as directed to check sugars twice daily 100 each 3  . metFORMIN (GLUCOPHAGE) 500 MG tablet Take 1 tablet (500 mg  total) by mouth daily with breakfast AND 2 tablets (1,000 mg total) daily with supper. 270 tablet 0  . metoprolol succinate (TOPROL-XL) 25 MG 24 hr tablet Take 1 tablet (25 mg total) by mouth at bedtime. 90 tablet 3  . Omega-3 Fatty Acids (FISH OIL) 1000 MG CAPS Take 1 capsule (1,000 mg total) by mouth 2 (two) times a day.  0  . omeprazole (PRILOSEC) 20 MG capsule TAKE ONE CAPSULE BY MOUTH ONCE DAILY AS NEEDED 90 capsule 0  . rosuvastatin (CRESTOR) 5 MG tablet Take 1 tablet (5 mg total) by mouth daily. 90 tablet 3  . vitamin B-12 (CYANOCOBALAMIN) 1000 MCG  tablet Take 1,000 mcg by mouth daily.    Marland Kitchen amLODipine (NORVASC) 5 MG tablet Take 1 tablet (5 mg total) by mouth at bedtime. 90 tablet 3  . indomethacin (INDOCIN) 50 MG capsule TAKE 1 CAPSULE BY MOUTH THREE TIMES DAILY AS NEEDED (Patient taking differently: Take 50 mg by mouth 3 (three) times daily as needed for moderate pain. TAKE 1 CAPSULE BY MOUTH THREE TIMES DAILY AS NEEDED.) 30 capsule 0  . insulin isophane & regular human (NOVOLIN 70/30 FLEXPEN) (70-30) 100 UNIT/ML KwikPen Inject 10 Units into the skin daily. With breakfast    . gabapentin (NEURONTIN) 300 MG capsule Take 1 capsule (300 mg total) by mouth 3 (three) times daily as needed (neuropathy). (Patient not taking: Reported on 01/12/2021) 90 capsule 1   No facility-administered medications prior to visit.     Per HPI unless specifically indicated in ROS section below Review of Systems Objective:  BP (!) 162/80 (BP Location: Right Arm, Patient Position: Sitting, Cuff Size: Large)   Pulse 70   Temp 97.6 F (36.4 C) (Temporal)   Ht 5' 10" (1.778 m)   Wt 236 lb (107 kg)   SpO2 95%   BMI 33.86 kg/m   Wt Readings from Last 3 Encounters:  01/12/21 236 lb (107 kg)  07/12/20 (!) 232 lb 7 oz (105.4 kg)  04/12/20 228 lb (103.4 kg)      Physical Exam Vitals and nursing note reviewed.  Constitutional:      General: He is not in acute distress.    Appearance: Normal appearance. He is well-developed and well-nourished. He is not ill-appearing.  HENT:     Head: Normocephalic and atraumatic.     Mouth/Throat:     Mouth: Oropharynx is clear and moist.  Eyes:     General: No scleral icterus.    Extraocular Movements: Extraocular movements intact and EOM normal.     Conjunctiva/sclera: Conjunctivae normal.     Pupils: Pupils are equal, round, and reactive to light.  Cardiovascular:     Rate and Rhythm: Normal rate and regular rhythm.     Pulses: Normal pulses and intact distal pulses.     Heart sounds: Normal heart sounds. No murmur  heard.   Pulmonary:     Effort: Pulmonary effort is normal. No respiratory distress.     Breath sounds: Normal breath sounds. No wheezing, rhonchi or rales.  Musculoskeletal:        General: No edema.     Cervical back: Normal range of motion and neck supple.     Right lower leg: No edema.     Left lower leg: No edema.     Comments: See HPI for foot exam if done  Lymphadenopathy:     Cervical: No cervical adenopathy.  Skin:    General: Skin is warm and dry.  Findings: No rash.  Neurological:     Mental Status: He is alert.  Psychiatric:        Mood and Affect: Mood and affect and mood normal.        Behavior: Behavior normal.       Results for orders placed or performed in visit on 01/12/21  POCT glycosylated hemoglobin (Hb A1C)  Result Value Ref Range   Hemoglobin A1C 6.8 (A) 4.0 - 5.6 %   HbA1c POC (<> result, manual entry)     HbA1c, POC (prediabetic range)     HbA1c, POC (controlled diabetic range)     Assessment & Plan:  This visit occurred during the SARS-CoV-2 public health emergency.  Safety protocols were in place, including screening questions prior to the visit, additional usage of staff PPE, and extensive cleaning of exam room while observing appropriate contact time as indicated for disinfecting solutions.   Problem List Items Addressed This Visit    Type 2 diabetes mellitus with other specified complication ([place]) - Primary    Chronic, stable. Continue current regimen.  Encouraged compliance with diabetic diet.  Anticipate recently elevated cbg's at home due to dietary indiscretions.       Type 2 diabetes mellitus with diabetic neuropathy, unspecified (H[facility])    Gabapentin was ineffective. Continue b12 replacement Consider trial nightly TCA.      Relevant Orders   POCT glycosylated hemoglobin (Hb A1C) (Completed)   Essential hypertension    Chronic, elevated despite current regimen. Increase amlodipine to 73m daily, limit salt/sodium in diet, RTC 6  wks f/u visit. I have also asked him to start monitoring BP at home.       Relevant Medications   amLODipine (NORVASC) 10 MG tablet   Dyslipidemia associated with type 2 diabetes mellitus ([place]    Other Visit Diagnoses    Need for influenza vaccination       Relevant Orders   Flu Vaccine QUAD High Dose(Fluad) (Completed)       Meds ordered this encounter  Medications  . indomethacin (INDOCIN) 50 MG capsule    Sig: Take 1 capsule (50 mg total) by mouth 3 (three) times daily as needed (gout flare).    Dispense:  30 capsule    Refill:  0    Please consider 90 day supplies to promote better adherence  . amLODipine (NORVASC) 10 MG tablet    Sig: Take 1 tablet (10 mg total) by mouth at bedtime.    Dispense:  90 tablet    Refill:  3   Orders Placed This Encounter  Procedures  . Flu Vaccine QUAD High Dose(Fluad)  . POCT glycosylated hemoglobin (Hb A1C)    Patient instructions: Flu shot today  Goal BP <140/90.  Ok to stay off insulin at this time - continue regular metformin. Work on low sugar low carb diabetic diet.  Good to see you today Return as needed or in 6 months for physical.   Follow up plan: Return in about 6 weeks (around 02/23/2021) for follow up visit.  JRia Bush MD

## 2021-01-12 NOTE — Assessment & Plan Note (Signed)
Chronic, stable. Continue current regimen.  Encouraged compliance with diabetic diet.  Anticipate recently elevated cbg's at home due to dietary indiscretions.

## 2021-01-12 NOTE — Patient Instructions (Addendum)
Flu shot today  Blood pressure staying too high - increase amlodipine to 10mg  daily.  Goal BP <140/90.  Ok to stay off insulin at this time - continue regular metformin. Work on low sugar low carb diabetic diet.  Good to see you today Return as needed or in 1-2 months for blood pressure check  Diabetes Mellitus and Nutrition, Adult When you have diabetes, or diabetes mellitus, it is very important to have healthy eating habits because your blood sugar (glucose) levels are greatly affected by what you eat and drink. Eating healthy foods in the right amounts, at about the same times every day, can help you:  Control your blood glucose.  Lower your risk of heart disease.  Improve your blood pressure.  Reach or maintain a healthy weight. What can affect my meal plan? Every person with diabetes is different, and each person has different needs for a meal plan. Your health care provider may recommend that you work with a dietitian to make a meal plan that is best for you. Your meal plan may vary depending on factors such as:  The calories you need.  The medicines you take.  Your weight.  Your blood glucose, blood pressure, and cholesterol levels.  Your activity level.  Other health conditions you have, such as heart or kidney disease. How do carbohydrates affect me? Carbohydrates, also called carbs, affect your blood glucose level more than any other type of food. Eating carbs naturally raises the amount of glucose in your blood. Carb counting is a method for keeping track of how many carbs you eat. Counting carbs is important to keep your blood glucose at a healthy level, especially if you use insulin or take certain oral diabetes medicines. It is important to know how many carbs you can safely have in each meal. This is different for every person. Your dietitian can help you calculate how many carbs you should have at each meal and for each snack. How does alcohol affect me? Alcohol  can cause a sudden decrease in blood glucose (hypoglycemia), especially if you use insulin or take certain oral diabetes medicines. Hypoglycemia can be a life-threatening condition. Symptoms of hypoglycemia, such as sleepiness, dizziness, and confusion, are similar to symptoms of having too much alcohol.  Do not drink alcohol if: ? Your health care provider tells you not to drink. ? You are pregnant, may be pregnant, or are planning to become pregnant.  If you drink alcohol: ? Do not drink on an empty stomach. ? Limit how much you use to:  0-1 drink a day for women.  0-2 drinks a day for men. ? Be aware of how much alcohol is in your drink. In the [place] one drink equals one 12 oz bottle of beer (355 mL), one 5 oz glass of wine (148 mL), or one 1 oz glass of hard liquor (44 mL). ? Keep yourself hydrated with water, diet soda, or unsweetened iced tea.  Keep in mind that regular soda, juice, and other mixers may contain a lot of sugar and must be counted as carbs. What are tips for following this plan? Reading food labels  Start by checking the serving size on the "Nutrition Facts" label of packaged foods and drinks. The amount of calories, carbs, fats, and other nutrients listed on the label is based on one serving of the item. Many items contain more than one serving per package.  Check the total grams (g) of carbs in one serving. You can calculate  the number of servings of carbs in one serving by dividing the total carbs by 15. For example, if a food has 30 g of total carbs per serving, it would be equal to 2 servings of carbs.  Check the number of grams (g) of saturated fats and trans fats in one serving. Choose foods that have a low amount or none of these fats.  Check the number of milligrams (mg) of salt (sodium) in one serving. Most people should limit total sodium intake to less than 2,300 mg per day.  Always check the nutrition information of foods labeled as "low-fat" or  "nonfat." These foods may be higher in added sugar or refined carbs and should be avoided.  Talk to your dietitian to identify your daily goals for nutrients listed on the label. Shopping  Avoid buying canned, pre-made, or processed foods. These foods tend to be high in fat, sodium, and added sugar.  Shop around the outside edge of the grocery store. This is where you will most often find fresh fruits and vegetables, bulk grains, fresh meats, and fresh dairy. Cooking  Use low-heat cooking methods, such as baking, instead of high-heat cooking methods like deep frying.  Cook using healthy oils, such as olive, canola, or sunflower oil.  Avoid cooking with butter, cream, or high-fat meats. Meal planning  Eat meals and snacks regularly, preferably at the same times every day. Avoid going long periods of time without eating.  Eat foods that are high in fiber, such as fresh fruits, vegetables, beans, and whole grains. Talk with your dietitian about how many servings of carbs you can eat at each meal.  Eat 4-6 oz (112-168 g) of lean protein each day, such as lean meat, chicken, fish, eggs, or tofu. One ounce (oz) of lean protein is equal to: ? 1 oz (28 g) of meat, chicken, or fish. ? 1 egg. ?  cup (62 g) of tofu.  Eat some foods each day that contain healthy fats, such as avocado, nuts, seeds, and fish.   What foods should I eat? Fruits Berries. Apples. Oranges. Peaches. Apricots. Plums. Grapes. Mango. Papaya. Pomegranate. Kiwi. Cherries. Vegetables Lettuce. Spinach. Leafy greens, including kale, chard, collard greens, and mustard greens. Beets. Cauliflower. Cabbage. Broccoli. Carrots. Green beans. Tomatoes. Peppers. Onions. Cucumbers. Brussels sprouts. Grains Whole grains, such as whole-wheat or whole-grain bread, crackers, tortillas, cereal, and pasta. Unsweetened oatmeal. Quinoa. Brown or wild rice. Meats and other proteins Seafood. Poultry without skin. Lean cuts of poultry and beef.  Tofu. Nuts. Seeds. Dairy Low-fat or fat-free dairy products such as milk, yogurt, and cheese. The items listed above may not be a complete list of foods and beverages you can eat. Contact a dietitian for more information. What foods should I avoid? Fruits Fruits canned with syrup. Vegetables Canned vegetables. Frozen vegetables with butter or cream sauce. Grains Refined white flour and flour products such as bread, pasta, snack foods, and cereals. Avoid all processed foods. Meats and other proteins Fatty cuts of meat. Poultry with skin. Breaded or fried meats. Processed meat. Avoid saturated fats. Dairy Full-fat yogurt, cheese, or milk. Beverages Sweetened drinks, such as soda or iced tea. The items listed above may not be a complete list of foods and beverages you should avoid. Contact a dietitian for more information. Questions to ask a health care provider  Do I need to meet with a diabetes educator?  Do I need to meet with a dietitian?  What number can I call if I have  questions?  When are the best times to check my blood glucose? Where to find more information:  American Diabetes Association: diabetes.org  Academy of Nutrition and Dietetics: www.eatright.[employer] of Diabetes and Digestive and Kidney Diseases: [employer]  Association of Diabetes Care and Education Specialists: www.diabeteseducator.org Summary  It is important to have healthy eating habits because your blood sugar (glucose) levels are greatly affected by what you eat and drink.  A healthy meal plan will help you control your blood glucose and maintain a healthy lifestyle.  Your health care provider may recommend that you work with a dietitian to make a meal plan that is best for you.  Keep in mind that carbohydrates (carbs) and alcohol have immediate effects on your blood glucose levels. It is important to count carbs and to use alcohol carefully. This information is not intended  to replace advice given to you by your health care provider. Make sure you discuss any questions you have with your health care provider. Document Revised: 11/04/2019 Document Reviewed: 11/04/2019 Elsevier Patient Education  2021 [employer].

## 2021-01-12 NOTE — Chronic Care Management (AMB) (Addendum)
Chronic Care Management Pharmacy Assistant   Name: Devin Huffman  MRN: [id number] DOB: 1946/08/29  Reason for Encounter: Medication Review- Medication Adherence and Delivery Coordination  Patient Question:  1.  Have you seen any other providers since your last visit? Yes 01/12/21- Dr. Ria Bush- PCP- Increased amlodipine from 5 mg to 10 mg.  Ok to stay off insulin at this time - continue regular metformin.  Work on low sugar low carb diabetic diet. Continue B12 supplement for neuropathy  PCP : Ria Bush, MD  Allergies:   Allergies  Allergen Reactions   Lisinopril Hives and Swelling    ?angioedema - lip swelling, hives    Medications: Outpatient Encounter Medications as of 01/12/2021  Medication Sig   amLODipine (NORVASC) 10 MG tablet Take 1 tablet (10 mg total) by mouth at bedtime.   Ascorbic Acid (VITAMIN C) 1000 MG tablet Take 1,000 mg by mouth every evening.    aspirin EC 81 MG tablet Take 81 mg by mouth every evening.    B Complex-Folic Acid (B COMPLEX-VITAMIN B12 PO) Take 1 tablet by mouth every evening.    blood glucose meter kit and supplies KIT Dispense based on patient and insurance preference. Use up to four times daily as directed. (FOR ICD-9 250.00, 250.01).   Cholecalciferol (VITAMIN D3) 50 MCG (2000 UT) capsule Take 50 mcg by mouth every evening.    Coenzyme Q10 50 MG CAPS Take 1 capsule (50 mg total) by mouth daily.   glucose blood (RELION TRUE METRIX TEST STRIPS) test strip Use as instructed to check blood sugar 2 times a day   indomethacin (INDOCIN) 50 MG capsule Take 1 capsule (50 mg total) by mouth 3 (three) times daily as needed (gout flare).   Insulin Pen Needle (RELION PEN NEEDLES) 31G X 6 MM MISC Use as directed to check sugars twice daily   metFORMIN (GLUCOPHAGE) 500 MG tablet Take 1 tablet (500 mg total) by mouth daily with breakfast AND 2 tablets (1,000 mg total) daily with supper.   metoprolol succinate (TOPROL-XL) 25 MG 24 hr tablet Take  1 tablet (25 mg total) by mouth at bedtime.   Omega-3 Fatty Acids (FISH OIL) 1000 MG CAPS Take 1 capsule (1,000 mg total) by mouth 2 (two) times a day.   omeprazole (PRILOSEC) 20 MG capsule TAKE ONE CAPSULE BY MOUTH ONCE DAILY AS NEEDED   rosuvastatin (CRESTOR) 5 MG tablet Take 1 tablet (5 mg total) by mouth daily.   vitamin B-12 (CYANOCOBALAMIN) 1000 MCG tablet Take 1,000 mcg by mouth daily.   [DISCONTINUED] lisinopril (PRINIVIL,ZESTRIL) 5 MG tablet Take 1 tablet (5 mg total) by mouth daily.   No facility-administered encounter medications on file as of 01/12/2021.    Current Diagnosis: Patient Active Problem List   Diagnosis Date Noted   Diabetic cataract of both eyes ([place]) 07/12/2020   PVD (peripheral vascular disease) ([place]) 09/10/2019   Right wrist pain 10/13/2018   Health maintenance examination 05/28/2017   Skin rash 05/28/2017   History of right hip replacement 03/26/2017   OA (osteoarthritis) of hip 03/07/2017   Thoracic aortic ectasia ([place]) 02/22/2017   Type 2 diabetes mellitus with diabetic neuropathy, unspecified ([place]) 08/31/2016   Obesity, Class I, BMI 30-34.9 05/14/2015   Advanced care planning/counseling discussion 11/11/2014   Vitamin B12 deficiency 10/15/2012   Medicare annual wellness visit, subsequent 10/02/2011   GERD (gastroesophageal reflux disease) 10/02/2011   Dyslipidemia associated with type 2 diabetes mellitus ([place]) 07/15/2009   Gout 07/15/2009  Type 2 diabetes mellitus with other specified complication ([place]) 52/84/1324   Essential hypertension 06/23/2008    BP Readings from Last 3 Encounters:  01/12/21 (!) 162/80  07/12/20 126/76  04/12/20 128/76    Lab Results  Component Value Date   HGBA1C 6.8 (A) 01/12/2021    Recent blood glucose readings are as follows: 01/10/21- 160 Fasting 01/05/21- 180 Fasting  Patient obtains medications through Vials  90 Days   Last adherence delivery date:  11/18/20 90 DS, Vials Amlodipine 5 mg- 1 tablet daily   Metoprolol 25 mg- 1 tablet daily Rosuvastatin 5 mg - 1 tablet daily Vitamin C 1000 mg- 1 tablet every evening  Resynced - delivered 12/22/20 Metformin 500 mg- 1 tablet three times a day -  filled 12/22/20 56 DS (RESYNCED) Vitamin B 12 1000 mcg-  filled- 12/22/20 56 DS (RESYNCED)  Patient declined the following medications last delivery: Gabapentin 100 mg - DISCONTINUED Gabapentin 300 mg- 1 capsule three times a day as needed- States he is not taking this.  Novolin 70-30 flex pen - Not taking  True Metric Glucose test strips "has plenty" Uses sparingly  PRN/OTC CoQ 10 100 mg- gets OTC Fish Oil 1000 mg - gets OTC Relion needles due to getting them OTC.  Vitamin d3 50 mcg- gets OTC  Patient is due for next scheduled adherence delivery on: 02/16/21  Called patient and reviewed medications - patient will need new dose amlodipine and reviewed any PRN med needs.  This delivery to include: 01/24/21 Amlodipine 10 mg - 1 tablet at bedtime - requested short fill to align with other synced medications (NEW DOSE) Indomethacin 50 mg- Take 1 as needed 3 times a day for gout flare up - patient requesting due to to current gout flare, reports flare is improving.  Patient declined the following medications this month: Maintenance meds not due until 02/16/21  True Metric Glucose test strips "has plenty" Uses sparingly Novolin - still hasnt needed this, per PCP okay to stay off 01/12/21  PRN/OTC CoQ 10 100 mg- gets OTC Fish Oil 1000 mg - gets OTC Relion needles due to getting them OTC.  Vitamin B 12 1000 mcg- Last filled- 12/22/20 Vitamin C 1000 mg- 1 tablet every evening - 90 DS 11/18/20 Vitamin d3 50 mcg- gets OTC  Patient does not need refills for any medications  Confirmed delivery date of 01/24/21, advised patient that pharmacy will contact him the morning of delivery.  Patient states he has not been checking blood pressure or blood glucose like he should. He saw Dr. Danise Mina today and his BP was  elevated. He is going to check his BP and BG more frequently in the next several weeks so he can report back to Dr. Danise Mina at his follow up in 6 weeks. Patient states Dr. Danise Mina increased his amlodipine to 10 mg. He would like to continue to take what he has at home but double up until he is out. He states he has about 30 pills left. He has some indomethacin on hand but would like refill delivered with his new dose of amlodipine.   Follow-Up:  Coordination of Enhanced Pharmacy Services and Pharmacist Review  Debbora Dus, CPP notified  Margaretmary Dys, [place] Pharmacy Assistant [phone]  I have reviewed the care management and care coordination activities outlined in this encounter and I am certifying that I agree with the content of this note. No further action required.  Patient to bring BG log and BP log to PCP follow  up in 6 weeks.  Debbora Dus, PharmD Clinical Pharmacist [facility] at [facility] [phone]

## 2021-01-12 NOTE — Assessment & Plan Note (Signed)
Gabapentin was ineffective. Continue b12 replacement Consider trial nightly TCA.

## 2021-01-12 NOTE — Assessment & Plan Note (Addendum)
Chronic, elevated despite current regimen. Increase amlodipine to 10mg  daily, limit salt/sodium in diet, RTC 6 wks f/u visit. I have also asked him to start monitoring BP at home.

## 2021-02-04 ENCOUNTER — Telehealth: Payer: Self-pay

## 2021-02-04 NOTE — Chronic Care Management (AMB) (Addendum)
Chronic Care Management Pharmacy Assistant   Name: Devin Huffman  MRN: [id number] DOB: 12-08-1946  Reason for Encounter: Medication Review- Medication Adherence and Delivery Coordination  Patient Question:  1.  Have you seen any other providers since your last visit? No  PCP : Ria Bush, MD  Allergies:   Allergies  Allergen Reactions   Lisinopril Hives and Swelling    ?angioedema - lip swelling, hives    Medications: Outpatient Encounter Medications as of 02/04/2021  Medication Sig   amLODipine (NORVASC) 10 MG tablet Take 1 tablet (10 mg total) by mouth at bedtime.   Ascorbic Acid (VITAMIN C) 1000 MG tablet Take 1,000 mg by mouth every evening.    aspirin EC 81 MG tablet Take 81 mg by mouth every evening.    B Complex-Folic Acid (B COMPLEX-VITAMIN B12 PO) Take 1 tablet by mouth every evening.    blood glucose meter kit and supplies KIT Dispense based on patient and insurance preference. Use up to four times daily as directed. (FOR ICD-9 250.00, 250.01).   Cholecalciferol (VITAMIN D3) 50 MCG (2000 UT) capsule Take 50 mcg by mouth every evening.    Coenzyme Q10 50 MG CAPS Take 1 capsule (50 mg total) by mouth daily.   glucose blood (RELION TRUE METRIX TEST STRIPS) test strip Use as instructed to check blood sugar 2 times a day   indomethacin (INDOCIN) 50 MG capsule Take 1 capsule (50 mg total) by mouth 3 (three) times daily as needed (gout flare).   Insulin Pen Needle (RELION PEN NEEDLES) 31G X 6 MM MISC Use as directed to check sugars twice daily   metFORMIN (GLUCOPHAGE) 500 MG tablet Take 1 tablet (500 mg total) by mouth daily with breakfast AND 2 tablets (1,000 mg total) daily with supper.   metoprolol succinate (TOPROL-XL) 25 MG 24 hr tablet Take 1 tablet (25 mg total) by mouth at bedtime.   Omega-3 Fatty Acids (FISH OIL) 1000 MG CAPS Take 1 capsule (1,000 mg total) by mouth 2 (two) times a day.   omeprazole (PRILOSEC) 20 MG capsule TAKE ONE CAPSULE BY MOUTH ONCE DAILY  AS NEEDED   rosuvastatin (CRESTOR) 5 MG tablet Take 1 tablet (5 mg total) by mouth daily.   vitamin B-12 (CYANOCOBALAMIN) 1000 MCG tablet Take 1,000 mcg by mouth daily.   [DISCONTINUED] lisinopril (PRINIVIL,ZESTRIL) 5 MG tablet Take 1 tablet (5 mg total) by mouth daily.   No facility-administered encounter medications on file as of 02/04/2021.    Current Diagnosis: Patient Active Problem List   Diagnosis Date Noted   Diabetic cataract of both eyes ([place]) 07/12/2020   PVD (peripheral vascular disease) ([place]) 09/10/2019   Right wrist pain 10/13/2018   Health maintenance examination 05/28/2017   Skin rash 05/28/2017   History of right hip replacement 03/26/2017   OA (osteoarthritis) of hip 03/07/2017   Thoracic aortic ectasia ([place]) 02/22/2017   Type 2 diabetes mellitus with diabetic neuropathy, unspecified ([place]) 08/31/2016   Obesity, Class I, BMI 30-34.9 05/14/2015   Advanced care planning/counseling discussion 11/11/2014   Vitamin B12 deficiency 10/15/2012   Medicare annual wellness visit, subsequent 10/02/2011   GERD (gastroesophageal reflux disease) 10/02/2011   Dyslipidemia associated with type 2 diabetes mellitus ([place]) 07/15/2009   Gout 07/15/2009   Type 2 diabetes mellitus with other specified complication ([place]) 63/84/5364   Essential hypertension 06/23/2008    Reviewed chart for medication changes ahead of medication coordination call.  No OVs, Consults, or hospital visits since last care coordination call /  Pharmacist visit. No medication changes indicated  BP Readings from Last 3 Encounters:  01/12/21 (!) 162/80  07/12/20 126/76  04/12/20 128/76    Lab Results  Component Value Date   HGBA1C 6.8 (A) 01/12/2021     Patient obtains medications through Vials  90 Days   Last adherence delivery date: 01/29/21  Amlodipine 10 mg - 1 tablet at bedtime - Short filled Indomethacin 50 mg- Take 1 as needed 3 times a day for gout flare up  Patient declined the following  medications last delivery: Amlodipine 5 mg- 1 tablet daily - 90 DS on 11/18/20 - DISCONTINUED 01/12/21- dose increase Metformin 500 mg- 1 tablet three times a day - last filled 12/22/20 56 DS Metoprolol 25 mg- 1 tablet daily- 90 DS 11/18/20 Novolin 70-30 flex pen - DISCONTINUED Rosuvastatin 5 mg - 1 tablet daily- 90 DS on 11/18/20 True Metric Glucose test strips "has plenty" Uses sparingly  PRN/OTC CoQ 10 100 mg- gets OTC Fish Oil 1000 mg - gets OTC  Patient is due for next adherence delivery on: 02/11/21  Contacted patient and reviewed medications and coordinated delivery.  This delivery to include: Vials  90 Days  Metformin 500 mg- 1 tablet three times a day  Rosuvastatin 5 mg - 1 tablet daily Vitamin B12 1000 mcg - Take 1 tablet by mouth every morning Vitamin C 1000 mg- 1 Tablet every evening  Patient declined the following medications this month: Amlodipine 10 mg - 1 tablet at bedtime- states he has a full bottle of 90 (still has 4 pills left of 5 mg) Metoprolol 25 mg- 1 tablet daily- (Patient states he has enough on hand for 1 month- 90 DS 11/18/20) True Metric Glucose test strips "has plenty" Uses sparingly  PRN/OTC CoQ 10 100 mg- gets OTC Fish Oil 1000 mg - gets OTC Vitamin D3 50 Mcg- 1 capsule every evening bought OTC  Patient does not need refills on any medications  Confirmed delivery date of 02/11/21, advised patient that pharmacy will contact him the morning of delivery.  Blood pressure readings as follows: 01/13/21- 154/74 01/15/21- 154/83 01/16/21- 144/88 01/19/21- 149/71  Patient states he only checks his blood sugar about once a week and for the past couple of weeks has forgotten to check. Patient States that he still has 4 pills of Amlodipine 5 mg left and will start his new bottle of Amlodipine 10 mg when the 5 mg's run out. Patient states he has a full bottle of metoprolol (enough that he can not quickly count them). Denies missing any doses. He thinks he had some on hand  from previous months. Set up time to call patient in about 3 weeks to review BG and BP readings and to check on status of metoprolol.   Follow-Up:  Coordination of Enhanced Pharmacy Services and Pharmacist Review  Debbora Dus, CPP notified  Margaretmary Dys, [place] Assistant [phone]  Need to confirm patient has been taking 2 tablets of amlodipine 5 mg daily or just 1 tablet daily. BP elevated. Will have Ria Comment update me.  Debbora Dus, PharmD Clinical Pharmacist [facility] at [facility] [phone]

## 2021-02-07 NOTE — Chronic Care Management (AMB) (Addendum)
Patient is taking two 5 mg tabs of amlodipine daily. Coordinated Metoprolol delivery for 02/25/21 based on patient report of 30 days remaining.  Follow-Up:  Comptroller and Pharmacist Review  Debbora Dus, CPP notified  Margaretmary Dys, [place] Pharmacy Assistant [phone]

## 2021-02-07 NOTE — Telephone Encounter (Signed)
Okay good, BP trending downward although last BP was from 2 weeks ago. He has an appt with PCP in 3 weeks, 3/16. Encourage him to bring home BP log to PCP appointment during your follow up call.  Debbora Dus, PharmD Clinical Pharmacist [facility] at [facility] [phone]

## 2021-02-07 NOTE — Telephone Encounter (Signed)
Has patient been taking two amlodipine 5 mg tabs daily or just 5 mg daily?

## 2021-02-15 ENCOUNTER — Telehealth: Payer: Self-pay

## 2021-02-15 MED ORDER — METFORMIN HCL 500 MG PO TABS: ORAL_TABLET | ORAL | 1 refills | Status: DC

## 2021-02-15 NOTE — Telephone Encounter (Signed)
-----   Message from [place], [facility] sent at 02/15/2021  9:19 AM EST ----- Regarding: Refill Refill request for metformin 90 DS to upstream.  Thanks!  Debbora Dus, PharmD Clinical Pharmacist [facility] [facility] at [facility] [phone]

## 2021-02-15 NOTE — Telephone Encounter (Signed)
E-scribed refill 

## 2021-02-22 ENCOUNTER — Telehealth: Payer: Self-pay

## 2021-02-22 NOTE — Chronic Care Management (AMB) (Addendum)
Chronic Care Management Pharmacy Assistant   Name: Devin Huffman  MRN: [id number] DOB: 1946/10/30  Reason for Encounter: Disease State- Diabetes and Hypertension   Conditions to be addressed/monitored: HTN and DMII   Recent office visits: No office visits since last CCM contact  Recent consult visits:  No recent consult visits  Hospital visits:  None in previous 6 months  Medications: Outpatient Encounter Medications as of 02/22/2021  Medication Sig   amLODipine (NORVASC) 10 MG tablet Take 1 tablet (10 mg total) by mouth at bedtime.   Ascorbic Acid (VITAMIN C) 1000 MG tablet Take 1,000 mg by mouth every evening.    aspirin EC 81 MG tablet Take 81 mg by mouth every evening.    B Complex-Folic Acid (B COMPLEX-VITAMIN B12 PO) Take 1 tablet by mouth every evening.    blood glucose meter kit and supplies KIT Dispense based on patient and insurance preference. Use up to four times daily as directed. (FOR ICD-9 250.00, 250.01).   Cholecalciferol (VITAMIN D3) 50 MCG (2000 UT) capsule Take 50 mcg by mouth every evening.    Coenzyme Q10 50 MG CAPS Take 1 capsule (50 mg total) by mouth daily.   glucose blood (RELION TRUE METRIX TEST STRIPS) test strip Use as instructed to check blood sugar 2 times a day   indomethacin (INDOCIN) 50 MG capsule Take 1 capsule (50 mg total) by mouth 3 (three) times daily as needed (gout flare).   Insulin Pen Needle (RELION PEN NEEDLES) 31G X 6 MM MISC Use as directed to check sugars twice daily   metFORMIN (GLUCOPHAGE) 500 MG tablet Take 1 tablet (500 mg total) by mouth daily with breakfast AND 2 tablets (1,000 mg total) daily with supper.   metoprolol succinate (TOPROL-XL) 25 MG 24 hr tablet Take 1 tablet (25 mg total) by mouth at bedtime.   Omega-3 Fatty Acids (FISH OIL) 1000 MG CAPS Take 1 capsule (1,000 mg total) by mouth 2 (two) times a day.   omeprazole (PRILOSEC) 20 MG capsule TAKE ONE CAPSULE BY MOUTH ONCE DAILY AS NEEDED   rosuvastatin (CRESTOR) 5  MG tablet Take 1 tablet (5 mg total) by mouth daily.   vitamin B-12 (CYANOCOBALAMIN) 1000 MCG tablet Take 1,000 mcg by mouth daily.   [DISCONTINUED] lisinopril (PRINIVIL,ZESTRIL) 5 MG tablet Take 1 tablet (5 mg total) by mouth daily.   No facility-administered encounter medications on file as of 02/22/2021.    Recent Office Vitals: BP Readings from Last 3 Encounters:  01/12/21 (!) 162/80  07/12/20 126/76  04/12/20 128/76   Pulse Readings from Last 3 Encounters:  01/12/21 70  07/12/20 67  04/12/20 (!) 54    Wt Readings from Last 3 Encounters:  01/12/21 236 lb (107 kg)  07/12/20 (!) 232 lb 7 oz (105.4 kg)  04/12/20 228 lb (103.4 kg)     Kidney Function Lab Results  Component Value Date/Time   CREATININE 0.97 07/05/2020 07:59 AM   CREATININE 0.88 02/24/2020 08:10 AM   GFR 75.31 07/05/2020 07:59 AM   GFRNONAA >60 01/27/2020 03:55 AM   GFRAA >60 01/27/2020 03:55 AM    BMP Latest Ref Rng & Units 07/05/2020 02/24/2020 01/27/2020  Glucose 70 - 99 mg/dL 155(H) 150(H) 201(H)  BUN 6 - 23 mg/dL 17 10 38(H)  Creatinine 0.40 - 1.50 mg/dL 0.97 0.88 0.86  Sodium 135 - 145 mEq/L 141 140 139  Potassium 3.5 - 5.1 mEq/L 4.9 4.5 4.3  Chloride 96 - 112 mEq/L 104 103 104  CO2 19 - 32 mEq/L 30 27 23   Calcium 8.4 - 10.5 mg/dL 9.4 9.0 9.2    Hypertension  Current antihypertensive regimen:  Amlodipine 10 mg - 1 tablet at bedtime (recently increased to 10 mg by PCP) Metoprolol succinate 25 mg- 1 tablet at bedtime  Verbally confirms adherence to above medications? Yes  How often are you checking your Blood Pressure? infrequently  he checks his blood pressure in the morning before taking his medication.  Current home BP readings: States he has not been monitoring his blood pressure as he should.   DATE:             BP               PULSE --  168/70  -  Wrist or arm cuff: Arm Caffeine intake: Does not drink a lot of caffeine Salt intake: Does not add salt to food OTC medications  including pseudoephedrine or NSAIDs? No  Adherence Review: Is the patient currently on ACE/ARB medication? No  Diabetes  Recent Relevant Labs: Lab Results  Component Value Date/Time   HGBA1C 6.8 (A) 01/12/2021 09:43 AM   HGBA1C 7.2 (H) 07/05/2020 07:59 AM   HGBA1C 9.0 (H) 01/25/2020 08:37 AM   MICROALBUR 1.0 07/12/2020 11:41 AM   MICROALBUR 2.3 (H) 07/04/2019 09:07 AM    Kidney Function Lab Results  Component Value Date/Time   CREATININE 0.97 07/05/2020 07:59 AM   CREATININE 0.88 02/24/2020 08:10 AM   GFR 75.31 07/05/2020 07:59 AM   GFRNONAA >60 01/27/2020 03:55 AM   GFRAA >60 01/27/2020 03:55 AM    Reviewed chart prior to disease state call. Spoke with patient regarding BG.  Current antihyperglycemic regimen:  Metformin 500 mg 1 tablet at breakfast, 2 tablets with supper  Verbally confirms adherence to above medications? Yes  How often are you checking your blood sugar? Checks rarely. States he often forgets to check.   What are your blood sugars ranging?   Fasting: 158 Before meals: N/A After meals: N/A Bedtime: N/A  Any blood sugars below 70? No   Adherence Review: Is the patient currently on a STATIN medication? Yes Is the patient currently on ACE/ARB medication? No Does the patient have >5 day gap between last estimated fill dates? No gaps in adherence  Star Rating Drugs:  Medication:  Last Fill: Day Supply Metformin 500 mg 02/15/21  85 Rosuvastatin 5 mg  02/10/21  90    Advised patient to check his blood pressure and blood sugar today so that he has something to take to his appointment tomorrow. Patient agreed.   Follow-Up:  Pharmacist Review  Debbora Dus, CPP notified  Margaretmary Dys, [place] Assistant [phone]  I have reviewed the care management and care coordination activities outlined in this encounter and I am certifying that I agree with the content of this note. No further action required. With A1c < 7% only on  metformin, he does not have to check BG routinely. Check with symptoms of hypoglycemia.   Debbora Dus, PharmD Clinical Pharmacist [place] [facility] at [facility] [phone]

## 2021-02-23 ENCOUNTER — Ambulatory Visit (INDEPENDENT_AMBULATORY_CARE_PROVIDER_SITE_OTHER): Payer: Medicare HMO | Admitting: Family Medicine

## 2021-02-23 ENCOUNTER — Other Ambulatory Visit: Payer: Self-pay

## 2021-02-23 ENCOUNTER — Encounter: Payer: Self-pay | Admitting: Family Medicine

## 2021-02-23 ENCOUNTER — Telehealth: Payer: Medicare HMO

## 2021-02-23 DIAGNOSIS — I10 Essential (primary) hypertension: Secondary | ICD-10-CM

## 2021-02-23 NOTE — Progress Notes (Signed)
Patient ID: Devin Huffman, male    DOB: 1946-08-19, 75 y.o.   MRN: [id number]  This visit was conducted in person.  BP 140/70   Pulse 70   Temp (!) 97.5 F (36.4 C) (Temporal)   Ht 5' 10"  (1.778 m)   Wt 235 lb 4 oz (106.7 kg)   SpO2 95%   BMI 33.75 kg/m    CC: HTN f/u visit  Subjective:   HPI: Devin Huffman is a 75 y.o. male presenting on 02/23/2021 for Hypertension (Here for a 6 wk f/u.)   Undergoing some stressful DIY home renovations.   See prior note for details.  Seen here last visit with elevated blood pressures despite amlodipine 69m and toprol XL 216mdaily. Lisinopril previously caused angioedema. We increased amlodipine to 108maily.   Brings home BP readings this week: 142-160/74-82  No HA, vision changes, CP/tightness, SOB, leg swelling. No low symptoms of dizziness or lightheadedness.  cbg 158 last few days.  Lab Results  Component Value Date   HGBA1C 6.8 (A) 01/12/2021        Relevant past medical, surgical, family and social history reviewed and updated as indicated. Interim medical history since our last visit reviewed. Allergies and medications reviewed and updated. Outpatient Medications Prior to Visit  Medication Sig Dispense Refill  . amLODipine (NORVASC) 10 MG tablet Take 1 tablet (10 mg total) by mouth at bedtime. 90 tablet 3  . Ascorbic Acid (VITAMIN C) 1000 MG tablet Take 1,000 mg by mouth every evening.     . aMarland Kitchenpirin EC 81 MG tablet Take 81 mg by mouth every evening.     . B Complex-Folic Acid (B COMPLEX-VITAMIN B12 PO) Take 1 tablet by mouth every evening.     . blood glucose meter kit and supplies KIT Dispense based on patient and insurance preference. Use up to four times daily as directed. (FOR ICD-9 250.00, 250.01). 1 each 0  . Cholecalciferol (VITAMIN D3) 50 MCG (2000 UT) capsule Take 50 mcg by mouth every evening.     . Coenzyme Q10 50 MG CAPS Take 1 capsule (50 mg total) by mouth daily.  0  . glucose blood (RELION TRUE METRIX TEST  STRIPS) test strip Use as instructed to check blood sugar 2 times a day 100 each 3  . indomethacin (INDOCIN) 50 MG capsule Take 1 capsule (50 mg total) by mouth 3 (three) times daily as needed (gout flare). 30 capsule 0  . Insulin Pen Needle (RELION PEN NEEDLES) 31G X 6 MM MISC Use as directed to check sugars twice daily 100 each 3  . metFORMIN (GLUCOPHAGE) 500 MG tablet Take 1 tablet (500 mg total) by mouth daily with breakfast AND 2 tablets (1,000 mg total) daily with supper. 270 tablet 1  . metoprolol succinate (TOPROL-XL) 25 MG 24 hr tablet Take 1 tablet (25 mg total) by mouth at bedtime. 90 tablet 3  . Omega-3 Fatty Acids (FISH OIL) 1000 MG CAPS Take 1 capsule (1,000 mg total) by mouth 2 (two) times a day.  0  . omeprazole (PRILOSEC) 20 MG capsule TAKE ONE CAPSULE BY MOUTH ONCE DAILY AS NEEDED 90 capsule 0  . rosuvastatin (CRESTOR) 5 MG tablet Take 1 tablet (5 mg total) by mouth daily. 90 tablet 3  . vitamin B-12 (CYANOCOBALAMIN) 1000 MCG tablet Take 1,000 mcg by mouth daily.     No facility-administered medications prior to visit.     Per HPI unless specifically indicated in ROS section  below Review of Systems Objective:  BP 140/70   Pulse 70   Temp (!) 97.5 F (36.4 C) (Temporal)   Ht 5' 10"  (1.778 m)   Wt 235 lb 4 oz (106.7 kg)   SpO2 95%   BMI 33.75 kg/m   Wt Readings from Last 3 Encounters:  02/23/21 235 lb 4 oz (106.7 kg)  01/12/21 236 lb (107 kg)  07/12/20 (!) 232 lb 7 oz (105.4 kg)      Physical Exam Vitals and nursing note reviewed.  Constitutional:      Appearance: Normal appearance. He is not ill-appearing.  Cardiovascular:     Rate and Rhythm: Normal rate.     Pulses: Normal pulses.     Heart sounds: Normal heart sounds. No murmur heard.   Pulmonary:     Effort: Pulmonary effort is normal. No respiratory distress.     Breath sounds: Normal breath sounds. No wheezing, rhonchi or rales.  Musculoskeletal:     Right lower leg: No edema.     Left lower leg:  No edema.  Neurological:     Mental Status: He is alert.  Psychiatric:        Mood and Affect: Mood normal.        Behavior: Behavior normal.       Results for orders placed or performed in visit on 01/12/21  POCT glycosylated hemoglobin (Hb A1C)  Result Value Ref Range   Hemoglobin A1C 6.8 (A) 4.0 - 5.6 %   HbA1c POC (<> result, manual entry)     HbA1c, POC (prediabetic range)     HbA1c, POC (controlled diabetic range)     Assessment & Plan:  This visit occurred during the SARS-CoV-2 public health emergency.  Safety protocols were in place, including screening questions prior to the visit, additional usage of staff PPE, and extensive cleaning of exam room while observing appropriate contact time as indicated for disinfecting solutions.   Problem List Items Addressed This Visit    Essential hypertension    Chronic, improving on higher amlodipine dose.  Home readings remaining elevated.  Advised to monitor daily for 2 wks and bring me log to review.  Will reassess need for med titration at that time.           No orders of the defined types were placed in this encounter.  No orders of the defined types were placed in this encounter.   Patient Instructions  You are doing well today  Blood pressures are better in office, but staying high at home. Continue daily monitoring blood pressures for the next 2 weeks and drop off a log to review.  Work on increase water intake.  Schedule physical after 07/12/2021    Follow up plan: Return in about 5 months (around 07/26/2021), or if symptoms worsen or fail to improve, for annual exam, prior fasting for blood work, medicare wellness visit.  Ria Bush, MD

## 2021-02-23 NOTE — Assessment & Plan Note (Signed)
Chronic, improving on higher amlodipine dose.  Home readings remaining elevated.  Advised to monitor daily for 2 wks and bring me log to review.  Will reassess need for med titration at that time.

## 2021-02-23 NOTE — Patient Instructions (Addendum)
You are doing well today  Blood pressures are better in office, but staying high at home. Continue daily monitoring blood pressures for the next 2 weeks and drop off a log to review.  Work on increase water intake.  Schedule physical after 07/12/2021

## 2021-03-17 ENCOUNTER — Other Ambulatory Visit: Payer: Self-pay

## 2021-03-17 ENCOUNTER — Ambulatory Visit: Payer: Medicare HMO

## 2021-03-17 DIAGNOSIS — I10 Essential (primary) hypertension: Secondary | ICD-10-CM

## 2021-03-17 NOTE — Progress Notes (Signed)
Pt has been getting elevated blood pressure numbers at home in the 170s-180s/80s-100s. He wanted to check his machine with what we get here in the office.  I checked manually and got 138/76. We checked with his and got 174/96. We then checked with an automatic machine we had in the office and it was 146/68 on the same arm.   Pt will get a new machine for home and give [place] an update on his numbers as the ones he had written down are most likely inacurate.

## 2021-03-22 ENCOUNTER — Telehealth: Payer: Medicare HMO

## 2021-03-22 ENCOUNTER — Telehealth: Payer: Self-pay

## 2021-03-22 NOTE — Progress Notes (Deleted)
Chronic Care Management Pharmacy Note  03/22/2021 Name:  MYKELL RAWL MRN:  [id number] DOB:  02-24-1946  Subjective: ROMEY MATHIESON is an 75 y.o. year old male who is a primary patient of Ria Bush, MD.  The CCM team was consulted for assistance with disease management and care coordination needs.    Engaged with patient by telephone for follow up visit in response to provider referral for pharmacy case management and/or care coordination services.   Consent to Services:  The patient was given information about Chronic Care Management services, agreed to services, and gave verbal consent prior to initiation of services.  Please see initial visit note for detailed documentation.   Patient Care Team: Ria Bush, MD as PCP - General (Family Medicine) Debbora Dus, [facility] as Pharmacist (Pharmacist)  Recent office visits: 03/17/21 - Pt brought in BP machine to check accuracy. Found inaccurate. Pt will get a new machine and provide update on his numbers.  02/23/21 - Home BP improving on amlodipine 10 mg, monitor for 2 weeks and drop off log with readings. Increase water intake. Home BP 142-160/74-82. Schedule follow up after 07/12/21.  Recent consult visits: None in previous 6 months  Hospital visits: None in previous 6 months  Objective:  Lab Results  Component Value Date   CREATININE 0.97 07/05/2020   BUN 17 07/05/2020   GFR 75.31 07/05/2020   GFRNONAA >60 01/27/2020   GFRAA >60 01/27/2020   NA 141 07/05/2020   K 4.9 07/05/2020   CALCIUM 9.4 07/05/2020   CO2 30 07/05/2020   GLUCOSE 155 (H) 07/05/2020    Lab Results  Component Value Date/Time   HGBA1C 6.8 (A) 01/12/2021 09:43 AM   HGBA1C 7.2 (H) 07/05/2020 07:59 AM   HGBA1C 9.0 (H) 01/25/2020 08:37 AM   FRUCTOSAMINE 219 02/24/2020 08:10 AM   GFR 75.31 07/05/2020 07:59 AM   GFR 84.35 02/24/2020 08:10 AM   MICROALBUR 1.0 07/12/2020 11:41 AM   MICROALBUR 2.3 (H) 07/04/2019 09:07 AM    Last diabetic Eye  exam:  Lab Results  Component Value Date/Time   HMDIABEYEEXA No Retinopathy 09/03/2019 12:00 AM    Last diabetic Foot exam: 01/12/21 completed by PCP   Lab Results  Component Value Date   CHOL 124 07/05/2020   HDL 43.00 07/05/2020   LDLCALC 47 07/05/2020   LDLDIRECT 63.0 07/04/2019   TRIG 172.0 (H) 07/05/2020   CHOLHDL 3 07/05/2020    Hepatic Function Latest Ref Rng & Units 07/05/2020 02/24/2020 01/27/2020  Total Protein 6.0 - 8.3 g/dL 6.6 6.5 6.5  Albumin 3.5 - 5.2 g/dL 4.2 3.7 3.2(L)  AST 0 - 37 U/L 19 16 13(L)  ALT 0 - 53 U/L 25 17 28   Alk Phosphatase 39 - 117 U/L 73 67 66  Total Bilirubin 0.2 - 1.2 mg/dL 0.6 0.8 0.6  Bilirubin, Direct 0.0 - 0.3 mg/dL - - -    Lab Results  Component Value Date/Time   TSH 2.16 07/10/2019 10:18 AM   TSH 0.91 08/31/2016 03:39 PM    CBC Latest Ref Rng & Units 02/24/2020 01/27/2020 01/26/2020  WBC 4.0 - 10.5 K/uL 9.3 16.6(H) 17.0(H)  Hemoglobin 13.0 - 17.0 g/dL 14.4 16.2 16.4  Hematocrit 39.0 - 52.0 % 42.8 47.2 47.8  Platelets 150.0 - 400.0 K/uL 344.0 458(H) 444(H)    No results found for: VD25OH  Clinical ASCVD: Yes  The ASCVD Risk score Mikey Bussing DC Jr., et al., 2013) failed to calculate for the following reasons:   The valid  total cholesterol range is 130 to 320 mg/dL    Depression screen [facility] 2/9 07/12/2020 07/10/2019 05/27/2018  Decreased Interest 0 0 0  Down, Depressed, Hopeless 0 0 0  PHQ - 2 Score 0 0 0  Altered sleeping 0 - 0  Tired, decreased energy 0 - 0  Change in appetite 0 - 0  Feeling bad or failure about yourself  0 - 0  Trouble concentrating 0 - 0  Moving slowly or fidgety/restless 0 - 0  Suicidal thoughts 0 - 0  PHQ-9 Score 0 - 0  Difficult doing work/chores Not difficult at all - Not difficult at all    Social History   Tobacco Use  Smoking Status Never Smoker  Smokeless Tobacco Never Used   BP Readings from Last 3 Encounters:  02/23/21 140/70  01/12/21 (!) 162/80  07/12/20 126/76   Pulse Readings from Last 3  Encounters:  02/23/21 70  01/12/21 70  07/12/20 67   Wt Readings from Last 3 Encounters:  02/23/21 235 lb 4 oz (106.7 kg)  01/12/21 236 lb (107 kg)  07/12/20 (!) 232 lb 7 oz (105.4 kg)   BMI Readings from Last 3 Encounters:  02/23/21 33.75 kg/m  01/12/21 33.86 kg/m  07/12/20 33.35 kg/m    Assessment/Interventions: Review of patient past medical history, allergies, medications, health status, including review of consultants reports, laboratory and other test data, was performed as part of comprehensive evaluation and provision of chronic care management services.   SDOH:  (Social Determinants of Health) assessments and interventions performed: Yes  SDOH Screenings   Alcohol Screen: Low Risk   . Last Alcohol Screening Score (AUDIT): 0  Depression (PHQ2-9): Low Risk   . PHQ-2 Score: 0  Financial Resource Strain: Low Risk   . Difficulty of Paying Living Expenses: Not hard at all  Food Insecurity: No Food Insecurity  . Worried About Charity fundraiser in the Last Year: Never true  . Ran Out of Food in the Last Year: Never true  Housing: Low Risk   . Last Housing Risk Score: 0  Physical Activity: Inactive  . Days of Exercise per Week: 0 days  . Minutes of Exercise per Session: 0 min  Social Connections: Not on file  Stress: No Stress Concern Present  . Feeling of Stress : Not at all  Tobacco Use: Low Risk   . Smoking Tobacco Use: Never Smoker  . Smokeless Tobacco Use: Never Used  Transportation Needs: No Transportation Needs  . Lack of Transportation (Medical): No  . Lack of Transportation (Non-Medical): No    CCM Care Plan  Allergies  Allergen Reactions  . Lisinopril Hives and Swelling    ?angioedema - lip swelling, hives    Medications Reviewed Today    Reviewed by Ria Bush, MD (Physician) on 02/23/21 at 0904  Med List Status: <None>  Medication Order Taking? Sig Documenting Provider Last Dose Status Informant  amLODipine (NORVASC) 10 MG tablet  [id number] Yes Take 1 tablet (10 mg total) by mouth at bedtime. Ria Bush, MD Taking Active   Ascorbic Acid (VITAMIN C) 1000 MG tablet [id number] Yes Take 1,000 mg by mouth every evening.  [provider] Taking Active Spouse/Significant Other  aspirin EC 81 MG tablet [id number] Yes Take 81 mg by mouth every evening.  Ria Bush, MD Taking Active Spouse/Significant Other  B Complex-Folic Acid (B COMPLEX-VITAMIN B12 PO) [id number] Yes Take 1 tablet by mouth every evening.  [provider] Taking Active Spouse/Significant Other  blood  glucose meter kit and supplies KIT [id number] Yes Dispense based on patient and insurance preference. Use up to four times daily as directed. (FOR ICD-9 250.00, 250.01). Little Ishikawa, MD Taking Active   Cholecalciferol (VITAMIN D3) 50 MCG (2000 UT) capsule [id number] Yes Take 50 mcg by mouth every evening.  [provider] Taking Active Spouse/Significant Other  Coenzyme Q10 50 MG CAPS [id number] Yes Take 1 capsule (50 mg total) by mouth daily. Ria Bush, MD Taking Active Spouse/Significant Other  glucose blood (RELION TRUE METRIX TEST STRIPS) test strip [id number] Yes Use as instructed to check blood sugar 2 times a day Ria Bush, MD Taking Active   indomethacin (INDOCIN) 50 MG capsule [id number] Yes Take 1 capsule (50 mg total) by mouth 3 (three) times daily as needed (gout flare). Ria Bush, MD Taking Active   Insulin Pen Needle (RELION PEN NEEDLES) 31G X 6 MM MISC [id number] Yes Use as directed to check sugars twice daily Ria Bush, MD Taking Active         Discontinued 02/22/12 1140 (Error)   metFORMIN (GLUCOPHAGE) 500 MG tablet [id number] Yes Take 1 tablet (500 mg total) by mouth daily with breakfast AND 2 tablets (1,000 mg total) daily with supper. Ria Bush, MD Taking Active   metoprolol succinate (TOPROL-XL) 25 MG 24 hr tablet [id number] Yes Take 1 tablet (25 mg total) by mouth at bedtime.  Ria Bush, MD Taking Active   Omega-3 Fatty Acids (FISH OIL) 1000 MG CAPS [id number] Yes Take 1 capsule (1,000 mg total) by mouth 2 (two) times a day. Ria Bush, MD Taking Active Spouse/Significant Other  omeprazole (PRILOSEC) 20 MG capsule [id number] Yes TAKE ONE CAPSULE BY MOUTH ONCE DAILY AS NEEDED Ria Bush, MD Taking Active Spouse/Significant Other  rosuvastatin (CRESTOR) 5 MG tablet [id number] Yes Take 1 tablet (5 mg total) by mouth daily. Ria Bush, MD Taking Active   vitamin B-12 (CYANOCOBALAMIN) 1000 MCG tablet [id number] Yes Take 1,000 mcg by mouth daily. [provider] Taking Active Spouse/Significant Other          Patient Active Problem List   Diagnosis Date Noted  . Diabetic cataract of both eyes ([place]) 07/12/2020  . PVD (peripheral vascular disease) ([place]) 09/10/2019  . Right wrist pain 10/13/2018  . Health maintenance examination 05/28/2017  . Skin rash 05/28/2017  . History of right hip replacement 03/26/2017  . OA (osteoarthritis) of hip 03/07/2017  . Thoracic aortic ectasia ([place]) 02/22/2017  . Type 2 diabetes mellitus with diabetic neuropathy, unspecified ([place]) 08/31/2016  . Obesity, Class I, BMI 30-34.9 05/14/2015  . Advanced care planning/counseling discussion 11/11/2014  . Vitamin B12 deficiency 10/15/2012  . Medicare annual wellness visit, subsequent 10/02/2011  . GERD (gastroesophageal reflux disease) 10/02/2011  . Dyslipidemia associated with type 2 diabetes mellitus ([place]) 07/15/2009  . Gout 07/15/2009  . Type 2 diabetes mellitus with other specified complication ([place]) 58/25/1898  . Essential hypertension 06/23/2008    Immunization History  Administered Date(s) Administered  . Fluad Quad(high Dose 65+) 01/12/2021  . Influenza Split 10/15/2012  . Influenza,inj,Quad PF,6+ Mos 11/11/2014, 11/17/2015, 08/31/2016, 11/28/2017, 10/21/2018  . PFIZER(Purple Top)SARS-COV-2 Vaccination 04/19/2020, 05/11/2020  . Pneumococcal  Conjugate-13 11/11/2014  . Pneumococcal Polysaccharide-23 10/15/2012  . Td 12/12/1996, 03/22/2009, 07/12/2020  . Zoster 10/13/2013    Conditions to be addressed/monitored:  Hypertension, Hyperlipidemia and Diabetes  There are no care plans that you recently modified to display for this patient.   Current Barriers:  . {pharmacybarriers:24917} . ***  Pharmacist Clinical Goal(s):  .  Patient will {PHARMACYGOALCHOICES:24921} through collaboration with PharmD and provider.  . ***  Interventions: . 1:1 collaboration with Ria Bush, MD regarding development and update of comprehensive plan of care as evidenced by provider attestation and co-signature . Inter-disciplinary care team collaboration (see longitudinal plan of care) . Comprehensive medication review performed; medication list updated in electronic medical record  Hypertension (BP goal <140/90) -{US controlled/uncontrolled:25276} -Current treatment: Amlodipine 10 mg - 1 tablet at bedtime Metoprolol 25 mg- 1 tablet daily -Medications previously tried: ***  -Current home readings: *** -Current dietary habits: *** -Current exercise habits: *** -{ACTIONS;DENIES/REPORTS:21021675::"Denies"} hypotensive/hypertensive symptoms -Educated on {CCM BP Counseling:25124} -Counseled to monitor BP at home ***, document, and provide log at future appointments -{CCMPHARMDINTERVENTION:25122}  Hyperlipidemia: (LDL goal < 70) -{US controlled/uncontrolled:25276} -Current treatment: Rosuvastatin 5 mg - 1 tablet daily CoQ 10 100 mg Fish Oil 1000 mg  -Medications previously tried: ***  -Current dietary patterns: *** -Current exercise habits: *** -Educated on {CCM HLD Counseling:25126} -{CCMPHARMDINTERVENTION:25122}  Diabetes (A1c goal <7%) -{US controlled/uncontrolled:25276} -Current medications: Metformin 500 mg- 1 tablet three times a day  -Medications previously tried: ***  -Current home glucose readings . fasting glucose:  *** . post prandial glucose: *** -{ACTIONS;DENIES/REPORTS:21021675::"Denies"} hypoglycemic/hyperglycemic symptoms -Current meal patterns:  . breakfast: ***  . lunch: ***  . dinner: *** . snacks: *** . drinks: *** -Current exercise: *** -Educated on {CCM DM COUNSELING:25123} -Counseled to check feet daily and get yearly eye exams -{CCMPHARMDINTERVENTION:25122}  Patient Goals/Self-Care Activities . Patient will:  - {pharmacypatientgoals:24919}  Follow Up Plan: {CM FOLLOW UP IDUP:73578}  Medication Assistance: {MEDASSISTANCEINFO:25044}  Patient's preferred pharmacy is:  [facility] - [place], [place] - [address] [place] [address] Phone: [phone] Fax: [phone]  [id number] [place] [place]Phone: [phone]: [phone] Uses pill box? {Yes or If no, why not?:20788} Pt endorses ***% compliance  We discussed: {Pharmacy options:24294} Patient decided to: {US Pharmacy PYDSW:97915} Care Plan and Follow Up Patient Decision:  {FOLLOWUP:24991}  Plan: {CM FOLLOW UP PWCHJ:64383} MDebbora Dus PharmD Clinical Pharmacist [phone]

## 2021-03-31 ENCOUNTER — Telehealth: Payer: Self-pay

## 2021-03-31 NOTE — Chronic Care Management (AMB) (Addendum)
Chronic Care Management Pharmacy Assistant   Name: Devin Huffman  MRN: [id number] DOB: October 31, 1946   Reason for Encounter: Medication Adherence Review and Delivery Coordination   Recent office visits:  02/23/2021 - Dr.Gutierrez, PCP - Blood pressures are better in office, but staying high at home. Continue daily monitoring blood pressures for the next 2 weeks and drop off a log to review.     Recent consult visits:  None since last CCM contact  Hospital visits:  None in previous 6 months  Medications: Outpatient Encounter Medications as of 03/31/2021  Medication Sig   amLODipine (NORVASC) 10 MG tablet Take 1 tablet (10 mg total) by mouth at bedtime.   Ascorbic Acid (VITAMIN C) 1000 MG tablet Take 1,000 mg by mouth every evening.    aspirin EC 81 MG tablet Take 81 mg by mouth every evening.    B Complex-Folic Acid (B COMPLEX-VITAMIN B12 PO) Take 1 tablet by mouth every evening.    blood glucose meter kit and supplies KIT Dispense based on patient and insurance preference. Use up to four times daily as directed. (FOR ICD-9 250.00, 250.01).   Cholecalciferol (VITAMIN D3) 50 MCG (2000 UT) capsule Take 50 mcg by mouth every evening.    Coenzyme Q10 50 MG CAPS Take 1 capsule (50 mg total) by mouth daily.   glucose blood (RELION TRUE METRIX TEST STRIPS) test strip Use as instructed to check blood sugar 2 times a day   indomethacin (INDOCIN) 50 MG capsule Take 1 capsule (50 mg total) by mouth 3 (three) times daily as needed (gout flare).   Insulin Pen Needle (RELION PEN NEEDLES) 31G X 6 MM MISC Use as directed to check sugars twice daily   metFORMIN (GLUCOPHAGE) 500 MG tablet Take 1 tablet (500 mg total) by mouth daily with breakfast AND 2 tablets (1,000 mg total) daily with supper.   metoprolol succinate (TOPROL-XL) 25 MG 24 hr tablet Take 1 tablet (25 mg total) by mouth at bedtime.   Omega-3 Fatty Acids (FISH OIL) 1000 MG CAPS Take 1 capsule (1,000 mg total) by mouth 2 (two) times a day.    omeprazole (PRILOSEC) 20 MG capsule TAKE ONE CAPSULE BY MOUTH ONCE DAILY AS NEEDED   rosuvastatin (CRESTOR) 5 MG tablet Take 1 tablet (5 mg total) by mouth daily.   vitamin B-12 (CYANOCOBALAMIN) 1000 MCG tablet Take 1,000 mcg by mouth daily.   [DISCONTINUED] lisinopril (PRINIVIL,ZESTRIL) 5 MG tablet Take 1 tablet (5 mg total) by mouth daily.   No facility-administered encounter medications on file as of 03/31/2021.    BP Readings from Last 3 Encounters:  02/23/21 140/70  01/12/21 (!) 162/80  07/12/20 126/76    Lab Results  Component Value Date   HGBA1C 6.8 (A) 01/12/2021     No OVs, Consults, or hospital visits since last care coordination call / Pharmacist visit.   No medication changes indicated  Patient obtains medications through Vials  90 Days   Last adherence delivery date:02/16/2021, 90 DS Amlodipine 10 mg- 1 tablet daily  Metformin 500 mg- 1 tablet three times a day Metoprolol 25 mg- 1 tablet daily Rosuvastatin 5 mg - 1 tablet daily Vitamin B 12 1000 mcg- 1 tablet daily  Vitamin C 1000 mg- 1 tablet every evening   Patient declined the following medications last delivery: CoQ 10 100 mg daily - gets OTC Fish Oil 1000 mg daily - gets OTC Vitamin D3 50 mcg daily - gets OTC Indomethacin - PRN Novolin 70-30 -  discontinued True Metrix Test Strips - OTC Relion Needles - OTC  Patient is due for next adherence delivery on: 05/19/21  Spoke with patient on 03/31/2021 and reviewed medications to ensure patient does not need any medications at this time.  This delivery to include: Vials  90 Days  None - no medications needed  Patient does not need refills on any medications.  Recent blood pressure readings are as follows: None reported today  Recent blood glucose readings are as follows: None available as the patient was outside in his yard working.  Follow-Up:  Coordination of Enhanced Pharmacy Services and Pharmacist Review  Debbora Dus, CPP notified  Avel Sensor, [place] Assistant [phone]  I have reviewed the care management and care coordination activities outlined in this encounter and I am certifying that I agree with the content of this note. No further action required.  Debbora Dus, PharmD Clinical Pharmacist [place] [facility] [phone]

## 2021-04-01 NOTE — Telephone Encounter (Signed)
Chronic Care Management Pharmacy Note  04/01/2021 Name:  Devin Huffman MRN:  [id number] DOB:  04-May-1946  Subjective: Devin Huffman is an 75 y.o. year old male who is a primary patient of Devin Bush, MD.  The CCM team was consulted for assistance with disease management and care coordination needs.    Attempted to reach patient by telephone for follow up visit in response to provider referral for pharmacy case management and/or care coordination services. Unable to reach, left VM. Chart reviewed prior to visit.  Consent to Services:  The patient was given information about Chronic Care Management services, agreed to services, and gave verbal consent prior to initiation of services.  Please see initial visit note for detailed documentation.   Patient Care Team: Devin Bush, MD as PCP - General (Family Medicine) Debbora Dus, [facility] as Pharmacist (Pharmacist)  Recent office visits: 03/17/21 - Pt brought in BP machine to check accuracy. Found inaccurate. Pt will get a new machine and provide update on his numbers.  02/23/21 - Home BP improving on amlodipine 10 mg, monitor for 2 weeks and drop off log with readings. Increase water intake. Home BP 142-160/74-82. Schedule follow up after 07/12/21.  Recent consult visits: None in previous 6 months  Hospital visits: None in previous 6 months  Objective:  Lab Results  Component Value Date   CREATININE 0.97 07/05/2020   BUN 17 07/05/2020   GFR 75.31 07/05/2020   GFRNONAA >60 01/27/2020   GFRAA >60 01/27/2020   NA 141 07/05/2020   K 4.9 07/05/2020   CALCIUM 9.4 07/05/2020   CO2 30 07/05/2020   GLUCOSE 155 (H) 07/05/2020    Lab Results  Component Value Date/Time   HGBA1C 6.8 (A) 01/12/2021 09:43 AM   HGBA1C 7.2 (H) 07/05/2020 07:59 AM   HGBA1C 9.0 (H) 01/25/2020 08:37 AM   FRUCTOSAMINE 219 02/24/2020 08:10 AM   GFR 75.31 07/05/2020 07:59 AM   GFR 84.35 02/24/2020 08:10 AM   MICROALBUR 1.0 07/12/2020 11:41 AM    MICROALBUR 2.3 (H) 07/04/2019 09:07 AM    Last diabetic Eye exam:  Lab Results  Component Value Date/Time   HMDIABEYEEXA No Retinopathy 09/03/2019 12:00 AM    Last diabetic Foot exam: 01/12/21 completed by PCP   Lab Results  Component Value Date   CHOL 124 07/05/2020   HDL 43.00 07/05/2020   LDLCALC 47 07/05/2020   LDLDIRECT 63.0 07/04/2019   TRIG 172.0 (H) 07/05/2020   CHOLHDL 3 07/05/2020    Hepatic Function Latest Ref Rng & Units 07/05/2020 02/24/2020 01/27/2020  Total Protein 6.0 - 8.3 g/dL 6.6 6.5 6.5  Albumin 3.5 - 5.2 g/dL 4.2 3.7 3.2(L)  AST 0 - 37 U/L 19 16 13(L)  ALT 0 - 53 U/L _0 Alk Phosphatase 39 - 117 U/L 73 67 66  Total Bilirubin 0.2 - 1.2 mg/dL 0.6 0.8 0.6  Bilirubin, Direct 0.0 - 0.3 mg/dL - - -    Lab Results  Component Value Date/Time   TSH 2.16 07/10/2019 10:18 AM   TSH 0.91 08/31/2016 03:39 PM    CBC Latest Ref Rng & Units 02/24/2020 01/27/2020 01/26/2020  WBC 4.0 - 10.5 K/uL 9.3 16.6(H) 17.0(H)  Hemoglobin 13.0 - 17.0 g/dL 14.4 16.2 16.4  Hematocrit 39.0 - 52.0 % 42.8 47.2 47.8  Platelets 150.0 - 400.0 K/uL 344.0 458(H) 444(H)    No results found for: VD25OH  Clinical ASCVD: Yes  The ASCVD Risk score Mikey Bussing DC Jr., et al., 2013) failed  to calculate for the following reasons:   The valid total cholesterol range is 130 to 320 mg/dL    Depression screen [facility] 2/9 07/12/2020 07/10/2019 05/27/2018  Decreased Interest 0 0 0  Down, Depressed, Hopeless 0 0 0  PHQ - 2 Score 0 0 0  Altered sleeping 0 - 0  Tired, decreased energy 0 - 0  Change in appetite 0 - 0  Feeling bad or failure about yourself  0 - 0  Trouble concentrating 0 - 0  Moving slowly or fidgety/restless 0 - 0  Suicidal thoughts 0 - 0  PHQ-9 Score 0 - 0  Difficult doing work/chores Not difficult at all - Not difficult at all    Social History   Tobacco Use  Smoking Status Never Smoker  Smokeless Tobacco Never Used   BP Readings from Last 3 Encounters:  02/23/21 140/70  01/12/21  (!) 162/80  07/12/20 126/76   Pulse Readings from Last 3 Encounters:  02/23/21 70  01/12/21 70  07/12/20 67   Wt Readings from Last 3 Encounters:  02/23/21 235 lb 4 oz (106.7 kg)  01/12/21 236 lb (107 kg)  07/12/20 (!) 232 lb 7 oz (105.4 kg)   BMI Readings from Last 3 Encounters:  02/23/21 33.75 kg/m  01/12/21 33.86 kg/m  07/12/20 33.35 kg/m    Assessment/Interventions: Review of patient past medical history, allergies, medications, health status, including review of consultants reports, laboratory and other test data, was performed as part of comprehensive evaluation and provision of chronic care management services.   SDOH:  (Social Determinants of Health) assessments and interventions performed: No  SDOH Screenings   Alcohol Screen: Low Risk   . Last Alcohol Screening Score (AUDIT): 0  Depression (PHQ2-9): Low Risk   . PHQ-2 Score: 0  Financial Resource Strain: Low Risk   . Difficulty of Paying Living Expenses: Not hard at all  Food Insecurity: No Food Insecurity  . Worried About Charity fundraiser in the Last Year: Never true  . Ran Out of Food in the Last Year: Never true  Housing: Low Risk   . Last Housing Risk Score: 0  Physical Activity: Inactive  . Days of Exercise per Week: 0 days  . Minutes of Exercise per Session: 0 min  Social Connections: Not on file  Stress: No Stress Concern Present  . Feeling of Stress : Not at all  Tobacco Use: Low Risk   . Smoking Tobacco Use: Never Smoker  . Smokeless Tobacco Use: Never Used  Transportation Needs: No Transportation Needs  . Lack of Transportation (Medical): No  . Lack of Transportation (Non-Medical): No    CCM Care Plan  Allergies  Allergen Reactions  . Lisinopril Hives and Swelling    ?angioedema - lip swelling, hives    Medications Reviewed Today    Reviewed by Devin Bush, MD (Physician) on 02/23/21 at 0904  Med List Status: <None>  Medication Order Taking? Sig Documenting Provider Last Dose  Status Informant  amLODipine (NORVASC) 10 MG tablet [id number] Yes Take 1 tablet (10 mg total) by mouth at bedtime. Devin Bush, MD Taking Active   Ascorbic Acid (VITAMIN C) 1000 MG tablet [id number] Yes Take 1,000 mg by mouth every evening.  [provider] Taking Active Spouse/Significant Other  aspirin EC 81 MG tablet [id number] Yes Take 81 mg by mouth every evening.  Devin Bush, MD Taking Active Spouse/Significant Other  B Complex-Folic Acid (B COMPLEX-VITAMIN B12 PO) [id number] Yes Take 1 tablet by mouth every evening.  [provider] Taking Active Spouse/Significant Other  blood glucose meter kit and supplies KIT [id number] Yes Dispense based on patient and insurance preference. Use up to four times daily as directed. (FOR ICD-9 250.00, 250.01). Little Ishikawa, MD Taking Active   Cholecalciferol (VITAMIN D3) 50 MCG (2000 UT) capsule [id number] Yes Take 50 mcg by mouth every evening.  [provider] Taking Active Spouse/Significant Other  Coenzyme Q10 50 MG CAPS [id number] Yes Take 1 capsule (50 mg total) by mouth daily. Devin Bush, MD Taking Active Spouse/Significant Other  glucose blood (RELION TRUE METRIX TEST STRIPS) test strip [id number] Yes Use as instructed to check blood sugar 2 times a day Devin Bush, MD Taking Active   indomethacin (INDOCIN) 50 MG capsule [id number] Yes Take 1 capsule (50 mg total) by mouth 3 (three) times daily as needed (gout flare). Devin Bush, MD Taking Active   Insulin Pen Needle (RELION PEN NEEDLES) 31G X 6 MM MISC [id number] Yes Use as directed to check sugars twice daily Devin Bush, MD Taking Active         Discontinued 02/22/12 1140 (Error)   metFORMIN (GLUCOPHAGE) 500 MG tablet [id number] Yes Take 1 tablet (500 mg total) by mouth daily with breakfast AND 2 tablets (1,000 mg total) daily with supper. Devin Bush, MD Taking Active   metoprolol succinate (TOPROL-XL) 25 MG 24 hr tablet [id number]  Yes Take 1 tablet (25 mg total) by mouth at bedtime. Devin Bush, MD Taking Active   Omega-3 Fatty Acids (FISH OIL) 1000 MG CAPS [id number] Yes Take 1 capsule (1,000 mg total) by mouth 2 (two) times a day. Devin Bush, MD Taking Active Spouse/Significant Other  omeprazole (PRILOSEC) 20 MG capsule [id number] Yes TAKE ONE CAPSULE BY MOUTH ONCE DAILY AS NEEDED Devin Bush, MD Taking Active Spouse/Significant Other  rosuvastatin (CRESTOR) 5 MG tablet [id number] Yes Take 1 tablet (5 mg total) by mouth daily. Devin Bush, MD Taking Active   vitamin B-12 (CYANOCOBALAMIN) 1000 MCG tablet [id number] Yes Take 1,000 mcg by mouth daily. [provider] Taking Active Spouse/Significant Other          Patient Active Problem List   Diagnosis Date Noted  . Diabetic cataract of both eyes ([place]) 07/12/2020  . PVD (peripheral vascular disease) ([place]) 09/10/2019  . Right wrist pain 10/13/2018  . Health maintenance examination 05/28/2017  . Skin rash 05/28/2017  . History of right hip replacement 03/26/2017  . OA (osteoarthritis) of hip 03/07/2017  . Thoracic aortic ectasia ([place]) 02/22/2017  . Type 2 diabetes mellitus with diabetic neuropathy, unspecified ([place]) 08/31/2016  . Obesity, Class I, BMI 30-34.9 05/14/2015  . Advanced care planning/counseling discussion 11/11/2014  . Vitamin B12 deficiency 10/15/2012  . Medicare annual wellness visit, subsequent 10/02/2011  . GERD (gastroesophageal reflux disease) 10/02/2011  . Dyslipidemia associated with type 2 diabetes mellitus ([place]) 07/15/2009  . Gout 07/15/2009  . Type 2 diabetes mellitus with other specified complication ([place]) 75/17/0017  . Essential hypertension 06/23/2008    Immunization History  Administered Date(s) Administered  . Fluad Quad(high Dose 65+) 01/12/2021  . Influenza Split 10/15/2012  . Influenza,inj,Quad PF,6+ Mos 11/11/2014, 11/17/2015, 08/31/2016, 11/28/2017, 10/21/2018  . PFIZER(Purple Top)SARS-COV-2  Vaccination 04/19/2020, 05/11/2020  . Pneumococcal Conjugate-13 11/11/2014  . Pneumococcal Polysaccharide-23 10/15/2012  . Td 12/12/1996, 03/22/2009, 07/12/2020  . Zoster 10/13/2013    Conditions to be addressed/monitored:  Hypertension, Hyperlipidemia and Diabetes   Hypertension (BP goal <140/90) -Unable to assess -Current treatment: Amlodipine 10 mg - 1 tablet at bedtime Metoprolol succinate  25 mg- 1 tablet daily  Hyperlipidemia: (LDL goal < 70) -Controlled - LDL 47 -Current treatment: Rosuvastatin 5 mg - 1 tablet daily CoQ 10 100 mg Fish Oil 1000 mg   Diabetes (A1c goal <7%) -Controlled - A1c 6.8% -Current treatment: Metformin 500 mg- 1 tablet at breakfast and 2 tablets at supper  Follow Up Plan:  -Contact patient to reschedule CCM visit for May 2022, remind him to keep log of BP at least twice weekly prior to visit. Make sure he replaced his home monitor as it was found inaccurate on 03/16/21.  Patient's preferred pharmacy is:  [facility] - [place], [place] - [address] [place] [address] Phone: [phone] Fax: [phone]  [id number] [place]Phone: [phone]: [phone] MDebbora Dus PharmD Clinical Pharmacist [phone]

## 2021-04-15 ENCOUNTER — Telehealth: Payer: Self-pay

## 2021-04-15 NOTE — Chronic Care Management (AMB) (Addendum)
Chronic Care Management Pharmacy Assistant   Name: Devin Huffman  MRN: [id number] DOB: [birth year]  Reason for Encounter: Reschedule Missed CCM Appointment  Medications: Outpatient Encounter Medications as of 04/15/2021  Medication Sig   amLODipine (NORVASC) 10 MG tablet Take 1 tablet (10 mg total) by mouth at bedtime.   Ascorbic Acid (VITAMIN C) 1000 MG tablet Take 1,000 mg by mouth every evening.    aspirin EC 81 MG tablet Take 81 mg by mouth every evening.    B Complex-Folic Acid (B COMPLEX-VITAMIN B12 PO) Take 1 tablet by mouth every evening.    blood glucose meter kit and supplies KIT Dispense based on patient and insurance preference. Use up to four times daily as directed. (FOR ICD-9 250.00, 250.01).   Cholecalciferol (VITAMIN D3) 50 MCG (2000 UT) capsule Take 50 mcg by mouth every evening.    Coenzyme Q10 50 MG CAPS Take 1 capsule (50 mg total) by mouth daily.   glucose blood (RELION TRUE METRIX TEST STRIPS) test strip Use as instructed to check blood sugar 2 times a day   indomethacin (INDOCIN) 50 MG capsule Take 1 capsule (50 mg total) by mouth 3 (three) times daily as needed (gout flare).   Insulin Pen Needle (RELION PEN NEEDLES) 31G X 6 MM MISC Use as directed to check sugars twice daily   metFORMIN (GLUCOPHAGE) 500 MG tablet Take 1 tablet (500 mg total) by mouth daily with breakfast AND 2 tablets (1,000 mg total) daily with supper.   metoprolol succinate (TOPROL-XL) 25 MG 24 hr tablet Take 1 tablet (25 mg total) by mouth at bedtime.   Omega-3 Fatty Acids (FISH OIL) 1000 MG CAPS Take 1 capsule (1,000 mg total) by mouth 2 (two) times a day.   omeprazole (PRILOSEC) 20 MG capsule TAKE ONE CAPSULE BY MOUTH ONCE DAILY AS NEEDED   rosuvastatin (CRESTOR) 5 MG tablet Take 1 tablet (5 mg total) by mouth daily.   vitamin B-12 (CYANOCOBALAMIN) 1000 MCG tablet Take 1,000 mcg by mouth daily.   [DISCONTINUED] lisinopril (PRINIVIL,ZESTRIL) 5 MG tablet Take 1 tablet (5 mg total) by mouth  daily.   No facility-administered encounter medications on file as of 04/15/2021.    Devin Huffman was contacted to reschedule his missed telephone visit with Devin Huffman. Appointment was made for  05/11/21 at 1:45 PM.  He was reminded to have all medications, supplements and any blood glucose and blood pressure readings available for review at appointment. Advised patient to keep log of his blood pressure he does note that he got a new monitor but he does not feel that this monitor is accurate either. He provided the following readings with mentioning that he had been resting for 5 mins, feet flat on the floor and arm on the table. The new monitor is an arm cuff monitor.  04/05/21: 6:25 am 137/87  04/07/21  7:50 am 160/87  04/08/21  6:25 am  153/91   Are you having any problems with your medications? No issues with medications.  What concerns would you like to discuss with the pharmacist? No concerns at this time    Star Rating Drugs: Medication:  Last Fill: Day Supply Rosuvastatin 5 mg 02/10/21  [place], CPP notified  Margaretmary Dys, [place] Assistant [phone]  I have reviewed the care management and care coordination activities outlined in this encounter and I am certifying that I agree with the content of this note. No further action required.  Sharyn Lull  Andree Elk, PharmD Clinical Pharmacist [place] [facility] [phone]

## 2021-04-26 DIAGNOSIS — Z20822 Contact with and (suspected) exposure to covid-19: Secondary | ICD-10-CM | POA: Diagnosis not present

## 2021-04-28 ENCOUNTER — Telehealth: Payer: Self-pay

## 2021-04-28 ENCOUNTER — Telehealth: Payer: Medicare HMO | Admitting: Primary Care

## 2021-04-28 DIAGNOSIS — J069 Acute upper respiratory infection, unspecified: Secondary | ICD-10-CM | POA: Diagnosis not present

## 2021-04-28 NOTE — Telephone Encounter (Signed)
Noted. plz call tomorrow for update on symptoms and UCC visit.

## 2021-04-28 NOTE — Telephone Encounter (Signed)
Noted  

## 2021-04-28 NOTE — Telephone Encounter (Signed)
pts wife calling;(DPR signed) starting on 04/23/21 pt has primarily dry cough but when productive pt swallows phlegm and does not know color of phlegm; a lot of head congestion; hurts in chest including arms when coughs. Pt has hx of pneumonia. Fever > 100 on and off; pt taking ibuprofen. Pt said feels like a lot of pressure like his head is going to bust when he coughs. No diarrhea or vomiting; no other covid symptoms. Pt has had 2 pfizer vaccines in 2021. Pt had covid test done on 04/26/21 at [facility] which was neg but [facility] could be testing too early or false neg. Pt is going to [facility] in [place]; pt is feeling bad now but not in any distress with breathing and does agree wants someone to listen to pts chest and have CXR if necessary. Sending note to Dr Darnell Level.

## 2021-04-29 NOTE — Telephone Encounter (Signed)
Attempted to contact pt.  No answer.  Need to get update on sxs.

## 2021-05-02 ENCOUNTER — Telehealth: Payer: Self-pay

## 2021-05-02 NOTE — Chronic Care Management (AMB) (Addendum)
Chronic Care Management Pharmacy Assistant   Name: Devin Huffman  MRN: [id number] DOB: 01/09/1946 .  Reason for Encounter: Medication Adherence and Delivery Coordination   Recent office visits:  None since last CCM contact  Recent consult visits:  04/28/2021 Urgent Care - Viral URI with cough, start benzonatate 100 mg - take 1 capsule 3 times daily PRN for 7 days   Hospital visits:  None in previous 6 months  Medications: Outpatient Encounter Medications as of 05/02/2021  Medication Sig   amLODipine (NORVASC) 10 MG tablet Take 1 tablet (10 mg total) by mouth at bedtime.   Ascorbic Acid (VITAMIN C) 1000 MG tablet Take 1,000 mg by mouth every evening.    aspirin EC 81 MG tablet Take 81 mg by mouth every evening.    B Complex-Folic Acid (B COMPLEX-VITAMIN B12 PO) Take 1 tablet by mouth every evening.    blood glucose meter kit and supplies KIT Dispense based on patient and insurance preference. Use up to four times daily as directed. (FOR ICD-9 250.00, 250.01).   Cholecalciferol (VITAMIN D3) 50 MCG (2000 UT) capsule Take 50 mcg by mouth every evening.    Coenzyme Q10 50 MG CAPS Take 1 capsule (50 mg total) by mouth daily.   glucose blood (RELION TRUE METRIX TEST STRIPS) test strip Use as instructed to check blood sugar 2 times a day   indomethacin (INDOCIN) 50 MG capsule Take 1 capsule (50 mg total) by mouth 3 (three) times daily as needed (gout flare).   Insulin Pen Needle (RELION PEN NEEDLES) 31G X 6 MM MISC Use as directed to check sugars twice daily   metFORMIN (GLUCOPHAGE) 500 MG tablet Take 1 tablet (500 mg total) by mouth daily with breakfast AND 2 tablets (1,000 mg total) daily with supper.   metoprolol succinate (TOPROL-XL) 25 MG 24 hr tablet Take 1 tablet (25 mg total) by mouth at bedtime.   Omega-3 Fatty Acids (FISH OIL) 1000 MG CAPS Take 1 capsule (1,000 mg total) by mouth 2 (two) times a day.   omeprazole (PRILOSEC) 20 MG capsule TAKE ONE CAPSULE BY MOUTH ONCE DAILY AS  NEEDED   rosuvastatin (CRESTOR) 5 MG tablet Take 1 tablet (5 mg total) by mouth daily.   vitamin B-12 (CYANOCOBALAMIN) 1000 MCG tablet Take 1,000 mcg by mouth daily.   [DISCONTINUED] lisinopril (PRINIVIL,ZESTRIL) 5 MG tablet Take 1 tablet (5 mg total) by mouth daily.   No facility-administered encounter medications on file as of 05/02/2021.    BP Readings from Last 3 Encounters:  02/23/21 140/70  01/12/21 (!) 162/80  07/12/20 126/76    Lab Results  Component Value Date   HGBA1C 6.8 (A) 01/12/2021     Patient obtains medications through Vials  90 Days   Last adherence delivery date:  02/11/21     Patient is due for next adherence delivery on: 05/09/2021  Spoke with patient on 05/02/2021 and reviewed medications and coordinated delivery.  This delivery to include: Vials  90 Days  VIAL medications: Amlodipine 10 mg - 1 tablet at bedtime  Metformin 500 mg- 1 tablet three times a day  Metoprolol 25 mg- 1 tablet daily Rosuvastatin 5 mg - 1 tablet daily Vitamin B12 1000 mcg - Take 1 tablet by mouth every morning Vitamin C 1000 mg- 1 Tablet every evening CoQ 10 100 mg-  1 a day morning Fish Oil 1000 mg -  1 a day morning Vitamin D3 50 Mcg- 1 capsule every evening   Patient  declined the following medications this month: Indomethacin 50 mg- Take 1 as needed 3 times a day for gout flare up Novolin 70-30 flex pen - DISCONTINUED  Patient does not need refills on any medications.  Confirmed delivery date of 05/09/2021  advised patient that pharmacy will contact him the morning of delivery.  Recent blood pressure readings are as follows:  None available  Recent blood glucose readings are as follows: None available  Follow-Up:  Coordination of Enhanced Pharmacy Services and Pharmacist Review  Debbora Dus, CPP notified  Avel Sensor, [place] Pharmacy Assistant [phone]  I have reviewed the care management and care coordination activities outlined in this encounter and  I am certifying that I agree with the content of this note. No further action required.  Debbora Dus, PharmD Clinical Pharmacist [place] [facility] [phone]

## 2021-05-02 NOTE — Telephone Encounter (Signed)
Attempted to contact pt.  No answer.  Need to get update on sxs.  

## 2021-05-03 NOTE — Telephone Encounter (Signed)
Attempted to contact pt.  No answer.  Need to get update on sxs.  Mailing a letter.

## 2021-05-10 ENCOUNTER — Telehealth: Payer: Self-pay | Admitting: Family Medicine

## 2021-05-10 DIAGNOSIS — R9389 Abnormal findings on diagnostic imaging of other specified body structures: Secondary | ICD-10-CM

## 2021-05-10 NOTE — Telephone Encounter (Signed)
See TE, 04/28/21.

## 2021-05-10 NOTE — Telephone Encounter (Signed)
Pt's wife calling in states that pt and wife has received a letter stating that to call back in with an update on how the patient was feeling. Pt is feeling a little bit better- feels tired 2 covid test which came back negative.

## 2021-05-11 ENCOUNTER — Telehealth: Payer: Medicare HMO

## 2021-05-11 NOTE — Telephone Encounter (Addendum)
Noted. He had abnormal CXR at [facility]. Let us know if recurrent cough, fever or shortness of breath. Recommend rpt CXR in 3-4 wks to ensure back to normal. I have ordered.

## 2021-05-11 NOTE — Addendum Note (Signed)
Addended by: Ria Bush on: 05/11/2021 08:06 AM   Modules accepted: Orders

## 2021-05-12 NOTE — Telephone Encounter (Signed)
Spoke with pt relaying Dr. G's message. Pt verbalizes understanding.  

## 2021-05-18 DIAGNOSIS — H6123 Impacted cerumen, bilateral: Secondary | ICD-10-CM | POA: Diagnosis not present

## 2021-05-18 DIAGNOSIS — H902 Conductive hearing loss, unspecified: Secondary | ICD-10-CM | POA: Diagnosis not present

## 2021-05-30 ENCOUNTER — Ambulatory Visit (INDEPENDENT_AMBULATORY_CARE_PROVIDER_SITE_OTHER)
Admission: RE | Admit: 2021-05-30 | Discharge: 2021-05-30 | Disposition: A | Discharge status: Home / Self Care | Payer: Medicare HMO | Source: Ambulatory Visit | Attending: Family Medicine | Admitting: Family Medicine

## 2021-05-30 DIAGNOSIS — R9389 Abnormal findings on diagnostic imaging of other specified body structures: Secondary | ICD-10-CM | POA: Diagnosis not present

## 2021-05-30 DIAGNOSIS — R918 Other nonspecific abnormal finding of lung field: Secondary | ICD-10-CM | POA: Diagnosis not present

## 2021-06-27 ENCOUNTER — Telehealth: Payer: Self-pay

## 2021-06-27 NOTE — Telephone Encounter (Signed)
Patient's wife called and would like Novolin 70-30 refilled to [facility]. This is no longer on med list as he was not taking at last PCP visit. Reports he is not watching diet and has started back using Novolin 70/30 on occasion for BG > 150 in the evening, 50 units as previously prescribed. Also requesting new prescription for diabetic glucometer and testing supplies. Please send a general prescription so we can determine which brand is covered by the insurance, thanks!  Debbora Dus, PharmD Clinical Pharmacist [place] [facility] at [facility] [phone]

## 2021-06-28 MED ORDER — NOVOLOG MIX 70/30 FLEXPEN (70-30) 100 UNIT/ML ~~LOC~~ SUPN: 10.0000 [IU] | PEN_INJECTOR | Freq: Every day | SUBCUTANEOUS | 1 refills | Status: DC

## 2021-06-28 MED ORDER — BLOOD GLUCOSE MONITOR KIT: PACK | 0 refills | Status: AC

## 2021-06-28 MED ORDER — GLUCOSE METER TEST VI STRP: ORAL_STRIP | 12 refills | Status: AC

## 2021-06-28 NOTE — Telephone Encounter (Signed)
Novolog 70/30 was started during [place] hospitalization 01/2020 while on decadron, however he hasn't taken consistently.  If he's going to restart, would recommend start at novolog 70/30 10u daily with breakfast - taking it every day will be more effective than PRN. I've sent in generic glucometer and test strips - recommend he keep log and drop off to review and further titrate based on results.  Regarding insulins - looks like levemir may be better covered by insurance

## 2021-06-28 NOTE — Telephone Encounter (Deleted)
Thank you very much! The novolog is not covered, could you send in Levemir?

## 2021-06-28 NOTE — Telephone Encounter (Signed)
Contacted patient's wife, patient has already picked up Novolin 70/30 (not Novolog) from [facility] over the counter. Instructed to take 10 units every morning. He will bring in log of BG readings in the next 2 weeks.  They want to use this until it runs out, then consider switch to insurance preferred if needed.  Debbora Dus, PharmD Clinical Pharmacist [place] [facility] at [facility] [phone]

## 2021-07-14 ENCOUNTER — Ambulatory Visit (INDEPENDENT_AMBULATORY_CARE_PROVIDER_SITE_OTHER): Payer: Medicare HMO

## 2021-07-14 DIAGNOSIS — Z0000 Encounter for general adult medical examination without abnormal findings: Secondary | ICD-10-CM

## 2021-07-14 NOTE — Patient Instructions (Signed)
[name] , Thank you for taking time to come for your Medicare Wellness Visit. I appreciate your ongoing commitment to your health goals. Please review the following plan we discussed and let me know if I can assist you in the future.   Screening recommendations/referrals: Colonoscopy: Up to date, completed 01/22/2018, due 01/2023 Recommended yearly ophthalmology/optometry visit for glaucoma screening and checkup Recommended yearly dental visit for hygiene and checkup  Vaccinations: Influenza vaccine: Up to date, completed 01/12/2021, due Fall 2022 Pneumococcal vaccine: Completed series Tdap vaccine: Up to date, completed 07/12/2020, due 07/2030 Shingles vaccine: due, check with your insurance regarding coverage if interested    Covid-19: completed 2 vaccines  Advanced directives: Advance directive discussed with you today. Even though you declined this today please call our office should you change your mind and we can give you the proper paperwork for you to fill out.   Conditions/risks identified: diabetes, hypertension, hyperlipidemia   Next appointment: Follow up in one year for your annual wellness visit.   Preventive Care 37 Years and Older, Male Preventive care refers to lifestyle choices and visits with your health care provider that can promote health and wellness. What does preventive care include? A yearly physical exam. This is also called an annual well check. Dental exams once or twice a year. Routine eye exams. Ask your health care provider how often you should have your eyes checked. Personal lifestyle choices, including: Daily care of your teeth and gums. Regular physical activity. Eating a healthy diet. Avoiding tobacco and drug use. Limiting alcohol use. Practicing safe sex. Taking low doses of aspirin every day. Taking vitamin and mineral supplements as recommended by your health care provider. What happens during an annual well check? The services and  screenings done by your health care provider during your annual well check will depend on your age, overall health, lifestyle risk factors, and family history of disease. Counseling  Your health care provider may ask you questions about your: Alcohol use. Tobacco use. Drug use. Emotional well-being. Home and relationship well-being. Sexual activity. Eating habits. History of falls. Memory and ability to understand (cognition). Work and work Statistician. Screening  You may have the following tests or measurements: Height, weight, and BMI. Blood pressure. Lipid and cholesterol levels. These may be checked every 5 years, or more frequently if you are over 4 years old. Skin check. Lung cancer screening. You may have this screening every year starting at age 55 if you have a 30-pack-year history of smoking and currently smoke or have quit within the past 15 years. Fecal occult blood test (FOBT) of the stool. You may have this test every year starting at age 77. Flexible sigmoidoscopy or colonoscopy. You may have a sigmoidoscopy every 5 years or a colonoscopy every 10 years starting at age 99. Prostate cancer screening. Recommendations will vary depending on your family history and other risks. Hepatitis C blood test. Hepatitis B blood test. Sexually transmitted disease (STD) testing. Diabetes screening. This is done by checking your blood sugar (glucose) after you have not eaten for a while (fasting). You may have this done every 1-3 years. Abdominal aortic aneurysm (AAA) screening. You may need this if you are a current or former smoker. Osteoporosis. You may be screened starting at age 51 if you are at high risk. Talk with your health care provider about your test results, treatment options, and if necessary, the need for more tests. Vaccines  Your health care provider may recommend certain vaccines, such as:  Influenza vaccine. This is recommended every year. Tetanus, diphtheria, and  acellular pertussis (Tdap, Td) vaccine. You may need a Td booster every 10 years. Zoster vaccine. You may need this after age 71. Pneumococcal 13-valent conjugate (PCV13) vaccine. One dose is recommended after age 68. Pneumococcal polysaccharide (PPSV23) vaccine. One dose is recommended after age 34. Talk to your health care provider about which screenings and vaccines you need and how often you need them. This information is not intended to replace advice given to you by your health care provider. Make sure you discuss any questions you have with your health care provider. Document Released: 12/24/2015 Document Revised: 08/16/2016 Document Reviewed: 09/28/2015 Elsevier Interactive Patient Education  2017 [place] Prevention in the Home Falls can cause injuries. They can happen to people of all ages. There are many things you can do to make your home safe and to help prevent falls. What can I do on the outside of my home? Regularly fix the edges of walkways and driveways and fix any cracks. Remove anything that might make you trip as you walk through a door, such as a raised step or threshold. Trim any bushes or trees on the path to your home. Use bright outdoor lighting. Clear any walking paths of anything that might make someone trip, such as rocks or tools. Regularly check to see if handrails are loose or broken. Make sure that both sides of any steps have handrails. Any raised decks and porches should have guardrails on the edges. Have any leaves, snow, or ice cleared regularly. Use sand or salt on walking paths during winter. Clean up any spills in your garage right away. This includes oil or grease spills. What can I do in the bathroom? Use night lights. Install grab bars by the toilet and in the tub and shower. Do not use towel bars as grab bars. Use non-skid mats or decals in the tub or shower. If you need to sit down in the shower, use a plastic, non-slip stool. Keep  the floor dry. Clean up any water that spills on the floor as soon as it happens. Remove soap buildup in the tub or shower regularly. Attach bath mats securely with double-sided non-slip rug tape. Do not have throw rugs and other things on the floor that can make you trip. What can I do in the bedroom? Use night lights. Make sure that you have a light by your bed that is easy to reach. Do not use any sheets or blankets that are too big for your bed. They should not hang down onto the floor. Have a firm chair that has side arms. You can use this for support while you get dressed. Do not have throw rugs and other things on the floor that can make you trip. What can I do in the kitchen? Clean up any spills right away. Avoid walking on wet floors. Keep items that you use a lot in easy-to-reach places. If you need to reach something above you, use a strong step stool that has a grab bar. Keep electrical cords out of the way. Do not use floor polish or wax that makes floors slippery. If you must use wax, use non-skid floor wax. Do not have throw rugs and other things on the floor that can make you trip. What can I do with my stairs? Do not leave any items on the stairs. Make sure that there are handrails on both sides of the stairs and use them.  Fix handrails that are broken or loose. Make sure that handrails are as long as the stairways. Check any carpeting to make sure that it is firmly attached to the stairs. Fix any carpet that is loose or worn. Avoid having throw rugs at the top or bottom of the stairs. If you do have throw rugs, attach them to the floor with carpet tape. Make sure that you have a light switch at the top of the stairs and the bottom of the stairs. If you do not have them, ask someone to add them for you. What else can I do to help prevent falls? Wear shoes that: Do not have high heels. Have rubber bottoms. Are comfortable and fit you well. Are closed at the toe. Do not  wear sandals. If you use a stepladder: Make sure that it is fully opened. Do not climb a closed stepladder. Make sure that both sides of the stepladder are locked into place. Ask someone to hold it for you, if possible. Clearly mark and make sure that you can see: Any grab bars or handrails. First and last steps. Where the edge of each step is. Use tools that help you move around (mobility aids) if they are needed. These include: Canes. Walkers. Scooters. Crutches. Turn on the lights when you go into a dark area. Replace any light bulbs as soon as they burn out. Set up your furniture so you have a clear path. Avoid moving your furniture around. If any of your floors are uneven, fix them. If there are any pets around you, be aware of where they are. Review your medicines with your doctor. Some medicines can make you feel dizzy. This can increase your chance of falling. Ask your doctor what other things that you can do to help prevent falls. This information is not intended to replace advice given to you by your health care provider. Make sure you discuss any questions you have with your health care provider. Document Released: 09/23/2009 Document Revised: 05/04/2016 Document Reviewed: 01/01/2015 Elsevier Interactive Patient Education  2017 [employer].

## 2021-07-14 NOTE — Progress Notes (Signed)
Subjective:   Devin Huffman is a 75 y.o. male who presents for Medicare Annual/Subsequent preventive examination.  Review of Systems: N/A      I connected with the patient today by telephone and verified that I am speaking with the correct person using two identifiers. Location patient: home Location nurse: work Persons participating in the telephone visit: patient, nurse.   I discussed the limitations, risks, security and privacy concerns of performing an evaluation and management service by telephone and the availability of in person appointments. I also discussed with the patient that there may be a patient responsible charge related to this service. The patient expressed understanding and verbally consented to this telephonic visit.        Cardiac Risk Factors include: advanced age (>61mn, >>40women);male gender;diabetes mellitus;hypertension;Other (see comment), Risk factor comments: hyperlipidemia     Objective:    Today's Vitals   There is no height or weight on file to calculate BMI.  Advanced Directives 07/14/2021 07/12/2020 01/23/2020 01/22/2020 05/27/2018 05/22/2017 03/26/2017  Does Patient Have a Medical Advance Directive? No No - No No (No Data) No  Does patient want to make changes to medical advance directive? - - - - No - Patient declined - -  Would patient like information on creating a medical advance directive? No - Patient declined No - Patient declined No - Patient declined - - - No - Patient declined  Pre-existing out of facility DNR order (yellow form or pink MOST form) - - - - - - -    Current Medications (verified) Outpatient Encounter Medications as of 07/14/2021  Medication Sig   amLODipine (NORVASC) 10 MG tablet Take 1 tablet (10 mg total) by mouth at bedtime.   Ascorbic Acid (VITAMIN C) 1000 MG tablet Take 1,000 mg by mouth every evening.    aspirin EC 81 MG tablet Take 81 mg by mouth every evening.    B Complex-Folic Acid (B COMPLEX-VITAMIN B12 PO) Take 1  tablet by mouth every evening.    blood glucose meter kit and supplies KIT Dispense based on patient and insurance preference. Use to check sugars up to twice daily as directed (E11.69).   Cholecalciferol (VITAMIN D3) 50 MCG (2000 UT) capsule Take 50 mcg by mouth every evening.    Coenzyme Q10 50 MG CAPS Take 1 capsule (50 mg total) by mouth daily.   glucose blood (GLUCOSE METER TEST) test strip Use as instructed to check sugars twice daily as needed E11.69   indomethacin (INDOCIN) 50 MG capsule Take 1 capsule (50 mg total) by mouth 3 (three) times daily as needed (gout flare).   insulin aspart protamine - aspart (NOVOLOG MIX 70/30 FLEXPEN) (70-30) 100 UNIT/ML FlexPen Inject 0.1 mLs (10 Units total) into the skin daily with breakfast.   Insulin Pen Needle (RELION PEN NEEDLES) 31G X 6 MM MISC Use as directed to check sugars twice daily   metFORMIN (GLUCOPHAGE) 500 MG tablet Take 1 tablet (500 mg total) by mouth daily with breakfast AND 2 tablets (1,000 mg total) daily with supper.   metoprolol succinate (TOPROL-XL) 25 MG 24 hr tablet Take 1 tablet (25 mg total) by mouth at bedtime.   Omega-3 Fatty Acids (FISH OIL) 1000 MG CAPS Take 1 capsule (1,000 mg total) by mouth 2 (two) times a day.   omeprazole (PRILOSEC) 20 MG capsule TAKE ONE CAPSULE BY MOUTH ONCE DAILY AS NEEDED   rosuvastatin (CRESTOR) 5 MG tablet Take 1 tablet (5 mg total) by mouth daily.  vitamin B-12 (CYANOCOBALAMIN) 1000 MCG tablet Take 1,000 mcg by mouth daily.   [DISCONTINUED] lisinopril (PRINIVIL,ZESTRIL) 5 MG tablet Take 1 tablet (5 mg total) by mouth daily.   No facility-administered encounter medications on file as of 07/14/2021.    Allergies (verified) Lisinopril   History: Past Medical History:  Diagnosis Date   Cancer ([place])    skin cancer removed from nose   Cerumen impaction 2016   bilateral with otitis externa s/p ENT removal - [facility]   GERD (gastroesophageal reflux disease)    occasional   Gout    ?due to L  great toe pain, saw Dr Gladstone Lighter   History of chicken pox    Hyperlipidemia    Hypertension    Legg-Perthes disease    Right hip   Nasal septal deviation    monitoring - [facility]   Pneumonia due to COVID-19 virus 01/21/2020   Prediabetes 03/22/2009   Sepsis ([place])    In March 2018   Past Surgical History:  Procedure Laterality Date   COLONOSCOPY  03/2011   2 tubular adenomas, rec rpt 5 yrs   COLONOSCOPY  01/2018   diverticulosis, rpt 5 yrs [facility])   HERNIA REPAIR     double hernia   LUMBAR FUSION  04/2013   transforaminal interbody fusion L4/5 ([place]onski) for R L5 radiculopathy, 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   Hypertension Maternal Uncle    Stroke Maternal Uncle    Coronary artery disease Paternal Uncle        MI   Cancer Other        niece-2 types   Stroke Maternal Grandfather    Social History   Socioeconomic History   Marital status: Married    Spouse name: Not on file   Number of children: Not on file   Years of education: Not on file   Highest education level: Not on file  Occupational History   Occupation: part-time  Tobacco Use   Smoking status: Never   Smokeless tobacco: Never  Vaping Use   Vaping Use: Never used  Substance and Sexual Activity   Alcohol use: No   Drug use: No   Sexual activity: Yes  Other Topics Concern   Not on file  Social History Narrative   Caffeine: few cups/day   Lives with wife, no pets    Occupation-Structural Museum/gallery conservator, "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  No regular exercise.   Diet: "I love my ice cream. I can't live forever", some water   Social Determinants of Health   Financial Resource Strain: Low Risk    Difficulty of Paying Living Expenses: Not hard at all  Food Insecurity: No Food Insecurity   Worried About Charity fundraiser in the Last Year: Never true   Ran Out of  Food in the Last Year: Never true  Transportation Needs: No Transportation Needs   Lack of Transportation (Medical): No   Lack of Transportation (Non-Medical): No  Physical Activity: Inactive   Days of Exercise per Week: 0 days   Minutes of Exercise per Session: 0 min  Stress: No Stress Concern Present   Feeling of Stress : Not at all  Social Connections: Not on file    Tobacco Counseling Counseling given: Not Answered   Clinical Intake:  Pre-visit preparation completed: Yes  Pain : No/denies pain     Nutritional Risks: None Diabetes: Yes CBG done?: No Did pt. bring in CBG monitor from home?: No  How often do you need to have someone help you when you read instructions, pamphlets, or other written materials from your doctor or pharmacy?: 1 - Never  Diabetic: Yes Nutrition Risk Assessment:  Has the patient had any N/V/D within the last 2 months?  No  Does the patient have any non-healing wounds?  No  Has the patient had any unintentional weight loss or weight gain?  No   Diabetes:  Is the patient diabetic?  Yes  If diabetic, was a CBG obtained today?  No  telephone visit  Did the patient bring in their glucometer from home?  No  telephone visit  How often do you monitor your CBG's? daily.   Financial Strains and Diabetes Management:  Are you having any financial strains with the device, your supplies or your medication? No .  Does the patient want to be seen by Chronic Care Management for management of their  diabetes?  No  Would the patient like to be referred to a Nutritionist or for Diabetic Management?  No   Diabetic Exams:  Diabetic Eye Exam: Completed 09/03/2020 Diabetic Foot Exam: Completed 01/12/2021   Interpreter Needed?: No  Information entered by :: CJohnson, RN   Activities of Daily Living In your present state of health, do you have any difficulty performing the following activities: 07/14/2021  Hearing? N  Vision? N  Difficulty concentrating or making decisions? N  Walking or climbing stairs? N  Dressing or bathing? N  Doing errands, shopping? N  Preparing Food and eating ? N  Using the Toilet? N  In the past six months, have you accidently leaked urine? N  Do you have problems with loss of bowel control? N  Managing your Medications? N  Managing your Finances? N  Housekeeping or managing your Housekeeping? N  Some recent data might be hidden    Patient Care Team: Ria Bush, MD as PCP - General (Family Medicine) Debbora Dus, [facility] as Pharmacist (Pharmacist)  Indicate any recent Medical Services you may have received from other than [facility] providers in the past year (date may be approximate).     Assessment:   This is a routine wellness examination for Rad.  Hearing/Vision screen Vision Screening - Comments:: Patient gets annual eye exams   Dietary issues and exercise activities discussed: Current Exercise Habits: The patient does not participate in regular exercise at present, Exercise limited by: None identified   Goals Addressed             This Visit's Progress    Patient Stated       07/14/2021, I will maintain and continue medications as prescribed.       Depression Screen PHQ 2/9 Scores 07/14/2021 07/12/2020 07/10/2019 05/27/2018 05/22/2017 11/17/2015 11/11/2014  PHQ - 2 Score 0 0 0 0 0 0 0  PHQ- 9  Score 0 0 - 0 - - -    Fall Risk Fall Risk  07/14/2021 07/12/2020 07/10/2019 05/27/2018 05/22/2017  Falls in the past year? 0 0 0 No No  Number falls  in past yr: 0 0 - - -  Injury with Fall? 0 0 - - -  Risk for fall due to : Medication side effect Medication side effect - - -  Follow up Falls evaluation completed;Falls prevention discussed Falls evaluation completed;Falls prevention discussed - - -    FALL RISK PREVENTION PERTAINING TO THE HOME:  Any stairs in or around the home? Yes  If so, are there any without handrails? No  Home free of loose throw rugs in walkways, pet beds, electrical cords, etc? Yes  Adequate lighting in your home to reduce risk of falls? Yes   ASSISTIVE DEVICES UTILIZED TO PREVENT FALLS:  Life alert? No  Use of a cane, walker or w/c? No  Grab bars in the bathroom? No  Shower chair or bench in shower? No  Elevated toilet seat or a handicapped toilet? No   TIMED UP AND GO:  Was the test performed?  N/A telephone visit  .   Cognitive Function: MMSE - Mini Mental State Exam 07/14/2021 07/12/2020 05/27/2018 05/22/2017  Not completed: Refused - - -  Orientation to time - _0 Orientation to Place - _1 Registration - _2 Attention/ Calculation - 5 0 0  Recall - _3 Language- name 2 objects - - 0 0  Language- repeat - _4 Language- follow 3 step command - - 3 3  Language- read & follow direction - - 0 0  Write a sentence - - 0 0  Copy design - - 0 0  Total score - - 20 20  Mini Cog  Mini-Cog screen was not completed. Wanted to skip this. Maximum score is 22. A value of 0 denotes this part of the MMSE was not completed or the patient failed this part of the Mini-Cog screening.       Immunizations Immunization History  Administered Date(s) Administered   Fluad Quad(high Dose 65+) 01/12/2021   Influenza Split 10/15/2012   Influenza,inj,Quad PF,6+ Mos 11/11/2014, 11/17/2015, 08/31/2016, 11/28/2017, 10/21/2018   PFIZER(Purple Top)SARS-COV-2 Vaccination 04/19/2020, 05/11/2020   Pneumococcal Conjugate-13 11/11/2014   Pneumococcal Polysaccharide-23 10/15/2012   Td 12/12/1996, 03/22/2009,  07/12/2020   Zoster, Live 10/13/2013    TDAP status: Up to date  Flu Vaccine status: Up to date  Pneumococcal vaccine status: Up to date  Covid-19 vaccine status: Completed 2 vaccines  Qualifies for Shingles Vaccine? Yes   Zostavax completed Yes   Shingrix Completed?: No.    Education has been provided regarding the importance of this vaccine. Patient has been advised to call insurance company to determine out of pocket expense if they have not yet received this vaccine. Advised may also receive vaccine at local pharmacy or Health Dept. Verbalized acceptance and understanding.  Screening Tests Health Maintenance  Topic Date Due   Zoster Vaccines- Shingrix (1 of 2) Never done   COVID-19 Vaccine (3 - Booster for Pfizer series) 10/11/2020   INFLUENZA VACCINE  07/11/2021   HEMOGLOBIN A1C  07/12/2021   URINE MICROALBUMIN  07/12/2021   OPHTHALMOLOGY EXAM  09/03/2021   FOOT EXAM  01/12/2022   COLONOSCOPY (Pts 45-20yr Insurance coverage will need to be confirmed)  01/22/2023   TETANUS/TDAP  07/12/2030   Hepatitis C Screening  Completed   PNA vac Low Risk Adult  Completed   HPV VACCINES  Aged Out    Health Maintenance  Health Maintenance Due  Topic Date Due   Zoster Vaccines- Shingrix (1 of 2) Never done   COVID-19 Vaccine (3 - Booster for Pfizer series) 10/11/2020   INFLUENZA VACCINE  07/11/2021   HEMOGLOBIN A1C  07/12/2021   URINE MICROALBUMIN  07/12/2021    Colorectal cancer screening: Type of screening: Colonoscopy. Completed 01/22/2018. Repeat every 5 years  Lung Cancer Screening: (Low Dose CT Chest recommended if Age 78-80 years, 30 pack-year currently smoking OR have quit w/in 15years.) does not qualify.   Additional Screening:  Hepatitis C Screening: does qualify; Completed 08/30/2016  Vision Screening: Recommended annual ophthalmology exams for early detection of glaucoma and other disorders of the eye. Is the patient up to date with their annual eye exam?  Yes   Who is the provider or what is the name of the office in which the patient attends annual eye exams? Eye doctor in [place], [place] If pt is not established with a provider, would they like to be referred to a provider to establish care? No .   Dental Screening: Recommended annual dental exams for proper oral hygiene  Community Resource Referral / Chronic Care Management: CRR required this visit?  No   CCM required this visit?  No      Plan:     I have personally reviewed and noted the following in the patient's chart:   Medical and social history Use of alcohol, tobacco or illicit drugs  Current medications and supplements including opioid prescriptions. Patient is not currently taking opioid prescriptions. Functional ability and status Nutritional status Physical activity Advanced directives List of other physicians Hospitalizations, surgeries, and ER visits in previous 12 months Vitals Screenings to include cognitive, depression, and falls Referrals and appointments  In addition, I have reviewed and discussed with patient certain preventive protocols, quality metrics, and best practice recommendations. A written personalized care plan for preventive services as well as general preventive health recommendations were provided to patient.   Due to this being a telephonic visit, the after visit summary with patients personalized plan was offered to patient via office or my-chart. Patient preferred to pick up at office at next visit or via mychart.   Andrez Grime, LPN   01/17/7411

## 2021-07-14 NOTE — Progress Notes (Signed)
PCP notes:  Health Maintenance: Shingrix- due    Abnormal Screenings: none   Patient concerns: Feet constantly burning/hurting    Nurse concerns: none   Next PCP appt.: 07/29/2021 @ 9 am

## 2021-07-18 ENCOUNTER — Other Ambulatory Visit: Payer: Self-pay | Admitting: Family Medicine

## 2021-07-18 DIAGNOSIS — E785 Hyperlipidemia, unspecified: Secondary | ICD-10-CM

## 2021-07-18 DIAGNOSIS — Z125 Encounter for screening for malignant neoplasm of prostate: Secondary | ICD-10-CM

## 2021-07-18 DIAGNOSIS — E1169 Type 2 diabetes mellitus with other specified complication: Secondary | ICD-10-CM

## 2021-07-18 DIAGNOSIS — E538 Deficiency of other specified B group vitamins: Secondary | ICD-10-CM

## 2021-07-18 DIAGNOSIS — M1A0710 Idiopathic chronic gout, right ankle and foot, without tophus (tophi): Secondary | ICD-10-CM

## 2021-07-21 ENCOUNTER — Other Ambulatory Visit (INDEPENDENT_AMBULATORY_CARE_PROVIDER_SITE_OTHER): Payer: Medicare HMO

## 2021-07-21 ENCOUNTER — Other Ambulatory Visit: Payer: Self-pay

## 2021-07-21 DIAGNOSIS — E538 Deficiency of other specified B group vitamins: Secondary | ICD-10-CM

## 2021-07-21 DIAGNOSIS — Z125 Encounter for screening for malignant neoplasm of prostate: Secondary | ICD-10-CM

## 2021-07-21 DIAGNOSIS — M1A0710 Idiopathic chronic gout, right ankle and foot, without tophus (tophi): Secondary | ICD-10-CM | POA: Diagnosis not present

## 2021-07-21 DIAGNOSIS — E1169 Type 2 diabetes mellitus with other specified complication: Secondary | ICD-10-CM

## 2021-07-21 DIAGNOSIS — E785 Hyperlipidemia, unspecified: Secondary | ICD-10-CM | POA: Diagnosis not present

## 2021-07-21 LAB — LIPID PANEL
Cholesterol: 112 mg/dL (ref 0–200)
HDL: 40.9 mg/dL (ref >39.00)
NonHDL: 70.73
Total CHOL/HDL Ratio: 3
Triglycerides: 236 mg/dL — ABNORMAL HIGH (ref 0.0–149.0)
VLDL: 47.2 mg/dL — ABNORMAL HIGH (ref 0.0–40.0)

## 2021-07-21 LAB — COMPREHENSIVE METABOLIC PANEL
ALT: 22 U/L (ref 0–53)
AST: 16 U/L (ref 0–37)
Albumin: 4.2 g/dL (ref 3.5–5.2)
Alkaline Phosphatase: 64 U/L (ref 39–117)
BUN: 15 mg/dL (ref 6–23)
CO2: 30 mEq/L (ref 19–32)
Calcium: 9.1 mg/dL (ref 8.4–10.5)
Chloride: 104 mEq/L (ref 96–112)
Creatinine, Ser: 0.91 mg/dL (ref 0.40–1.50)
GFR: 81.73 mL/min (ref >60.00)
Glucose, Bld: 129 mg/dL — ABNORMAL HIGH (ref 70–99)
Potassium: 5.1 mEq/L (ref 3.5–5.1)
Sodium: 140 mEq/L (ref 135–145)
Total Bilirubin: 0.7 mg/dL (ref 0.2–1.2)
Total Protein: 6.5 g/dL (ref 6.0–8.3)

## 2021-07-21 LAB — HEMOGLOBIN A1C: Hgb A1c MFr Bld: 6.9 % — ABNORMAL HIGH (ref 4.6–6.5)

## 2021-07-21 LAB — URIC ACID: Uric Acid, Serum: 6.8 mg/dL (ref 4.0–7.8)

## 2021-07-21 LAB — VITAMIN B12: Vitamin B-12: 511 pg/mL (ref 211–911)

## 2021-07-21 LAB — PSA: PSA: 1.19 ng/mL (ref 0.10–4.00)

## 2021-07-21 LAB — LDL CHOLESTEROL, DIRECT: Direct LDL: 45 mg/dL

## 2021-07-25 ENCOUNTER — Other Ambulatory Visit: Payer: Self-pay | Admitting: Family Medicine

## 2021-07-26 ENCOUNTER — Ambulatory Visit: Payer: Medicare HMO

## 2021-07-27 ENCOUNTER — Telehealth: Payer: Self-pay

## 2021-07-27 MED ORDER — METOPROLOL SUCCINATE ER 25 MG PO TB24: 25.0000 mg | ORAL_TABLET | Freq: Every day | ORAL | 0 refills | Status: DC

## 2021-07-27 MED ORDER — ROSUVASTATIN CALCIUM 5 MG PO TABS: 5.0000 mg | ORAL_TABLET | Freq: Every day | ORAL | 0 refills | Status: DC

## 2021-07-27 NOTE — Telephone Encounter (Signed)
E-scribed refills.  

## 2021-07-28 IMAGING — DX DG CHEST 1V PORT
1 series · 1 of 1 positions shown · non-contrast
Comparison: Radiograph 03/26/2017

CLINICAL DATA: Worsening shortness of breath. COVID diagnosis
01/15/2020.

EXAM:
PORTABLE CHEST 1 VIEW

[chest ap]
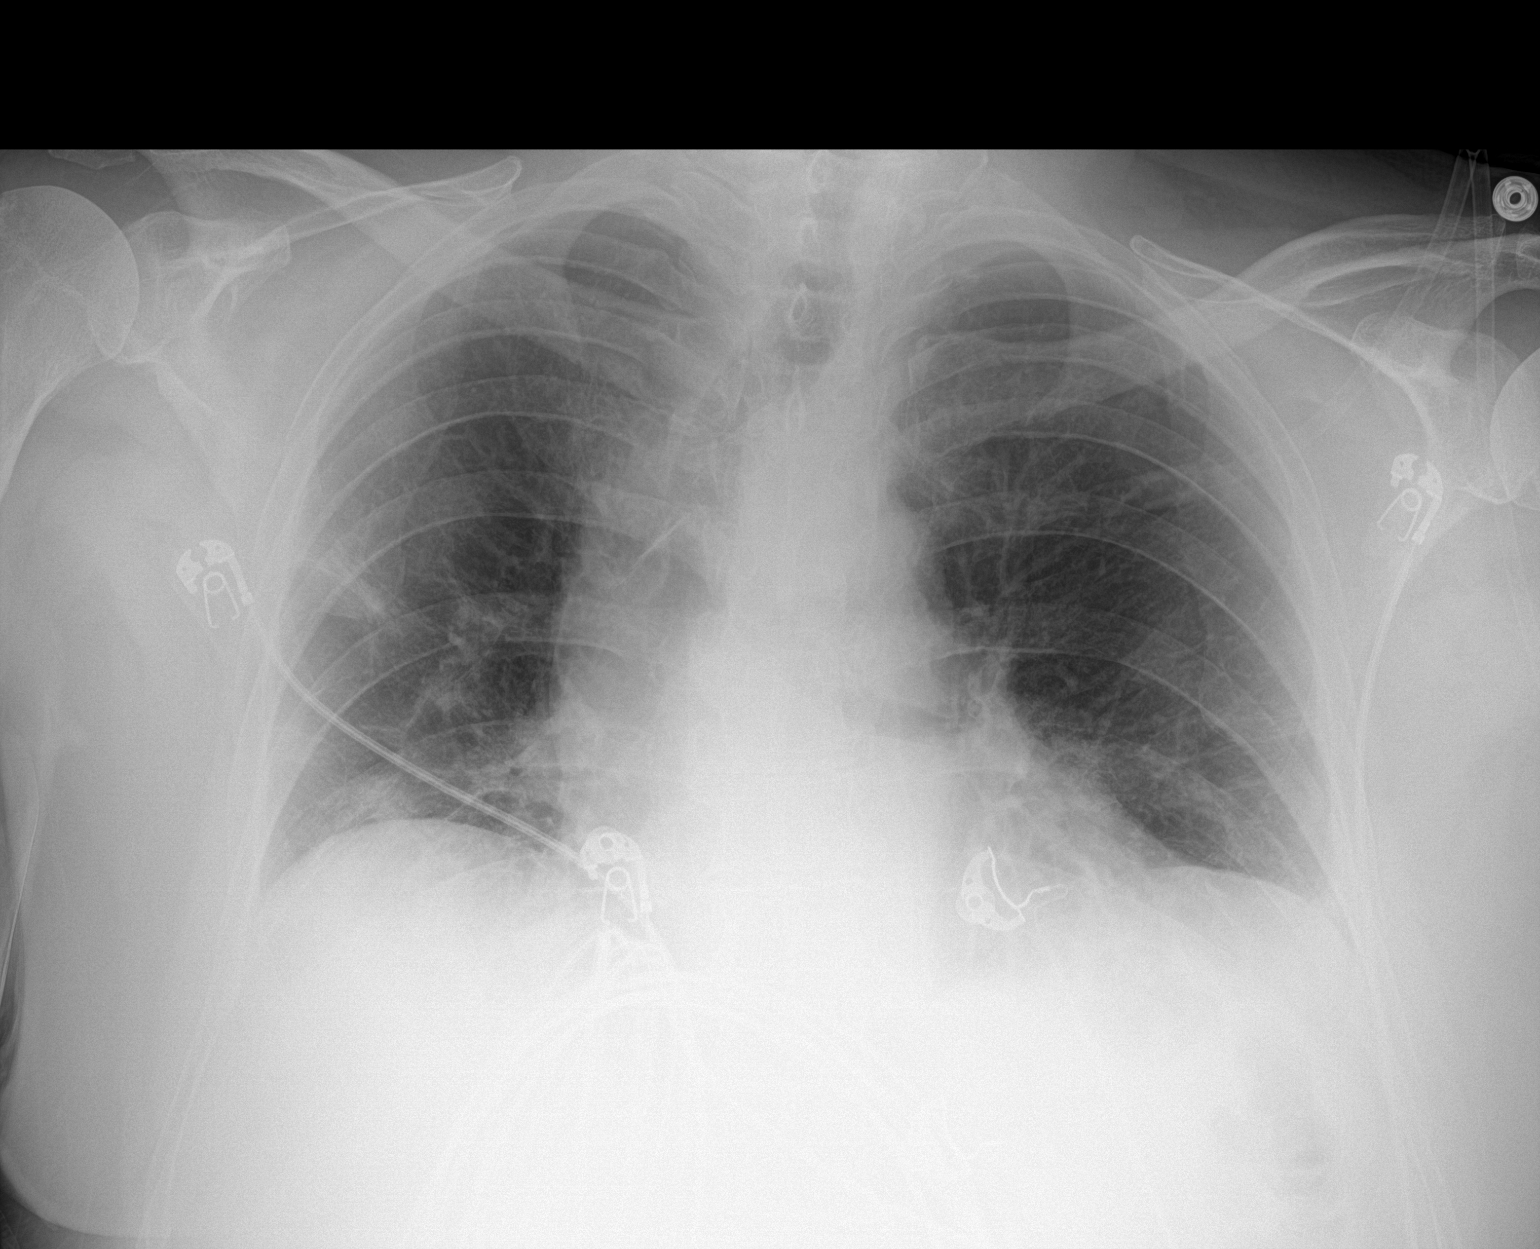

[1 of 1 positions shown; findings below may reference images not displayed]

FINDINGS: Patchy and streaky opacities in both lung bases and right midlung
zone. Overall lung volumes are low. Unchanged heart size and
mediastinal contours. No pulmonary edema or large pleural effusion.
No pneumothorax. No acute osseous abnormalities are seen.
IMPRESSION: Heterogeneous bibasilar and right midlung opacities consistent with
multifocal pneumonia, pattern typical of [id number].

## 2021-07-29 ENCOUNTER — Ambulatory Visit (INDEPENDENT_AMBULATORY_CARE_PROVIDER_SITE_OTHER): Payer: Medicare HMO | Admitting: Family Medicine

## 2021-07-29 ENCOUNTER — Telehealth: Payer: Self-pay

## 2021-07-29 ENCOUNTER — Other Ambulatory Visit: Payer: Self-pay

## 2021-07-29 ENCOUNTER — Encounter: Payer: Self-pay | Admitting: Family Medicine

## 2021-07-29 VITALS — BP 140/74 | HR 70 | Temp 97.5°F | Ht 70.0 in | Wt 231.5 lb

## 2021-07-29 DIAGNOSIS — E1140 Type 2 diabetes mellitus with diabetic neuropathy, unspecified: Secondary | ICD-10-CM

## 2021-07-29 DIAGNOSIS — Z0000 Encounter for general adult medical examination without abnormal findings: Secondary | ICD-10-CM | POA: Diagnosis not present

## 2021-07-29 DIAGNOSIS — E1169 Type 2 diabetes mellitus with other specified complication: Secondary | ICD-10-CM | POA: Diagnosis not present

## 2021-07-29 DIAGNOSIS — I10 Essential (primary) hypertension: Secondary | ICD-10-CM

## 2021-07-29 DIAGNOSIS — Z794 Long term (current) use of insulin: Secondary | ICD-10-CM

## 2021-07-29 DIAGNOSIS — E669 Obesity, unspecified: Secondary | ICD-10-CM

## 2021-07-29 DIAGNOSIS — M1A0710 Idiopathic chronic gout, right ankle and foot, without tophus (tophi): Secondary | ICD-10-CM

## 2021-07-29 DIAGNOSIS — I77810 Thoracic aortic ectasia: Secondary | ICD-10-CM

## 2021-07-29 DIAGNOSIS — E1136 Type 2 diabetes mellitus with diabetic cataract: Secondary | ICD-10-CM | POA: Diagnosis not present

## 2021-07-29 DIAGNOSIS — E538 Deficiency of other specified B group vitamins: Secondary | ICD-10-CM | POA: Diagnosis not present

## 2021-07-29 DIAGNOSIS — E785 Hyperlipidemia, unspecified: Secondary | ICD-10-CM | POA: Diagnosis not present

## 2021-07-29 MED ORDER — ALPHA-LIPOIC ACID 600 MG PO CAPS: 1.0000 | ORAL_CAPSULE | Freq: Every day | ORAL | 3 refills | Status: DC

## 2021-07-29 MED ORDER — AMITRIPTYLINE HCL 25 MG PO TABS: 25.0000 mg | ORAL_TABLET | Freq: Every day | ORAL | 3 refills | Status: DC

## 2021-07-29 NOTE — Assessment & Plan Note (Signed)
Encouraged healthy diet and lifestyle choices to affect sustainable weight loss.  ?

## 2021-07-29 NOTE — Progress Notes (Signed)
Patient ID: Devin Huffman, male    DOB: 1946/08/02, 75 y.o.   MRN: [id number]  This visit was conducted in person.  BP 140/74   Pulse 70   Temp (!) 97.5 F (36.4 C) (Temporal)   Ht 5' 10"  (1.778 m)   Wt 231 lb 8 oz (105 kg)   SpO2 96%   BMI 33.22 kg/m    CC: CPE Subjective:   HPI: Devin Huffman is a 75 y.o. male presenting on 07/29/2021 for Annual Exam (Prt 2. )   Saw health advisor last week for medicare wellness visit. Note reviewed.   No results found.  Flowsheet Row Clinical Support from 07/14/2021 in [place] at [facility] [place]  PHQ-2 Total Score 0       Fall Risk  07/14/2021 07/12/2020 07/10/2019 05/27/2018 05/22/2017  Falls in the past year? 0 0 0 No No  Number falls in past yr: 0 0 - - -  Injury with Fall? 0 0 - - -  Risk for fall due to : Medication side effect Medication side effect - - -  Follow up Falls evaluation completed;Falls prevention discussed Falls evaluation completed;Falls prevention discussed - - -    Brings cbg log from the past month - fasting sugars ranging 130-150. We recently restarted novolin 70/30 10u PRN if cbg >150, takes with breakfast. Continues metformin 500/1000mg daily. Sometimes misses am dose.  Lab Results  Component Value Date   HGBA1C 6.9 (H) 07/21/2021  Notes ongoing foot pain/burning sensation, paresthesias/numbness - describes burning sensation to soles of feet "like walking on bubble wrap". Gabapentin was ineffective. Bilateral, symmetrical.   Preventative: COLONOSCOPY 01/2018 diverticulosis, rpt 5 yrs [place] Plan) Prostate cancer screening - PSA yearly, DRE every few years. Mild nocturia, strong stream. Discussed stopping screening.  Lung cancer screening - not eligible  Flu shot yearly [place] 04/2020, 05/2020 Tetanus 2010, Td 07/2020 Pneumovax 2013, prevnar 2015  zostavax 10/13/2013   Shingrix - discussed, will check at pharmacy  Advanced directives: does not want prolonged life support. [place] for reversible  cause. Wife would be HCPOA.  Seat belt use discussed Sunscreen use discussed. No changing moles on skin. 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 No regular exercise.   Diet: some water, enjoys sweets      Relevant past medical, surgical, family and social history reviewed and updated as indicated. Interim medical history since our last visit reviewed. Allergies and medications reviewed and updated. Outpatient Medications Prior to Visit  Medication Sig Dispense Refill   amLODipine (NORVASC) 10 MG tablet Take 1 tablet (10 mg total) by mouth at bedtime. 90 tablet 3   Ascorbic Acid (VITAMIN C) 1000 MG tablet Take 1,000 mg by mouth every evening.      aspirin EC 81 MG tablet Take 81 mg by mouth every evening.      B Complex-Folic Acid (B COMPLEX-VITAMIN B12 PO) Take 1 tablet by mouth every evening.      blood glucose meter kit and supplies KIT Dispense based on patient and insurance preference. Use to check sugars up to twice daily as directed (E11.69). 1 each 0   Cholecalciferol (VITAMIN D3) 50  MCG (2000 UT) capsule Take 50 mcg by mouth every evening.      Coenzyme Q10 50 MG CAPS Take 1 capsule (50 mg total) by mouth daily.  0   glucose blood (GLUCOSE METER TEST) test strip Use as instructed to check sugars twice daily as needed E11.69 100 each 12   indomethacin (INDOCIN) 50 MG capsule Take 1 capsule (50 mg total) by mouth 3 (three) times daily as needed (gout flare). 30 capsule 0   insulin aspart protamine - aspart (NOVOLOG MIX 70/30 FLEXPEN) (70-30) 100 UNIT/ML FlexPen Inject 0.1 mLs (10 Units total) into the skin daily with breakfast. (Patient taking differently: Inject 10 Units into the  skin daily with breakfast. Sliding scale) 9 mL 1   Insulin Pen Needle (RELION PEN NEEDLES) 31G X 6 MM MISC Use as directed to check sugars twice daily 100 each 3   metFORMIN (GLUCOPHAGE) 500 MG tablet TAKE ONE TABLET BY MOUTH EVERY MORNING and TAKE TWO TABLETS BY MOUTH EVERY EVENING 270 tablet 0   metoprolol succinate (TOPROL-XL) 25 MG 24 hr tablet Take 1 tablet (25 mg total) by mouth at bedtime. 90 tablet 0   Omega-3 Fatty Acids (FISH OIL) 1000 MG CAPS Take 1 capsule (1,000 mg total) by mouth 2 (two) times a day.  0   omeprazole (PRILOSEC) 20 MG capsule TAKE ONE CAPSULE BY MOUTH ONCE DAILY AS NEEDED 90 capsule 0   rosuvastatin (CRESTOR) 5 MG tablet Take 1 tablet (5 mg total) by mouth daily. 90 tablet 0   vitamin B-12 (CYANOCOBALAMIN) 1000 MCG tablet Take 1,000 mcg by mouth daily.     No facility-administered medications prior to visit.     Per HPI unless specifically indicated in ROS section below Review of Systems  Constitutional:  Negative for activity change, appetite change, chills, fatigue, fever and unexpected weight change.  HENT:  Negative for hearing loss.   Eyes:  Negative for visual disturbance.  Respiratory:  Negative for cough, chest tightness, shortness of breath and wheezing.   Cardiovascular:  Negative for chest pain, palpitations and leg swelling.  Gastrointestinal:  Negative for abdominal distention, abdominal pain, blood in stool, constipation, diarrhea, nausea and vomiting.  Genitourinary:  Negative for difficulty urinating and hematuria.  Musculoskeletal:  Negative for arthralgias, myalgias and neck pain.  Skin:  Negative for rash.  Neurological:  Negative for dizziness, seizures, syncope and headaches.  Hematological:  Negative for adenopathy. Does not bruise/bleed easily.  Psychiatric/Behavioral:  Negative for dysphoric mood. The patient is not nervous/anxious.    Objective:  BP 140/74   Pulse 70   Temp (!) 97.5 F (36.4 C) (Temporal)   Ht 5' 10"  (1.778 m)    Wt 231 lb 8 oz (105 kg)   SpO2 96%   BMI 33.22 kg/m   Wt Readings from Last 3 Encounters:  07/29/21 231 lb 8 oz (105 kg)  02/23/21 235 lb 4 oz (106.7 kg)  01/12/21 236 lb (107 kg)      Physical Exam Vitals and nursing note reviewed.  Constitutional:      General: He is not in acute distress.    Appearance: Normal appearance. He is well-developed. He is not ill-appearing.  HENT:     Head: Normocephalic and atraumatic.     Right Ear: Hearing, tympanic membrane, ear canal and external ear normal.     Left Ear: Hearing, tympanic membrane, ear canal and external ear normal.  Eyes:     General: No scleral icterus.    Extraocular  Movements: Extraocular movements intact.     Conjunctiva/sclera: Conjunctivae normal.     Pupils: Pupils are equal, round, and reactive to light.  Neck:     Thyroid: No thyroid mass or thyromegaly.     Vascular: No carotid bruit.  Cardiovascular:     Rate and Rhythm: Normal rate and regular rhythm.     Pulses: Normal pulses.          Radial pulses are 2+ on the right side and 2+ on the left side.     Heart sounds: Normal heart sounds. No murmur heard. Pulmonary:     Effort: Pulmonary effort is normal. No respiratory distress.     Breath sounds: Normal breath sounds. No wheezing, rhonchi or rales.  Abdominal:     General: Bowel sounds are normal. There is no distension.     Palpations: Abdomen is soft. There is no mass.     Tenderness: There is no abdominal tenderness. There is no guarding or rebound.     Hernia: No hernia is present.  Musculoskeletal:        General: Normal range of motion.     Cervical back: Normal range of motion and neck supple.     Right lower leg: No edema.     Left lower leg: No edema.  Lymphadenopathy:     Cervical: No cervical adenopathy.  Skin:    General: Skin is warm and dry.     Findings: No rash.  Neurological:     General: No focal deficit present.     Mental Status: He is alert and oriented to person, place, and  time.  Psychiatric:        Mood and Affect: Mood normal.        Behavior: Behavior normal.        Thought Content: Thought content normal.        Judgment: Judgment normal.      Results for orders placed or performed in visit on 07/21/21  PSA  Result Value Ref Range   PSA 1.19 0.10 - 4.00 ng/mL  Vitamin B12  Result Value Ref Range   Vitamin B-12 511 211 - 911 pg/mL  Uric acid  Result Value Ref Range   Uric Acid, Serum 6.8 4.0 - 7.8 mg/dL  Hemoglobin A1c  Result Value Ref Range   Hgb A1c MFr Bld 6.9 (H) 4.6 - 6.5 %  Lipid panel  Result Value Ref Range   Cholesterol 112 0 - 200 mg/dL   Triglycerides 236.0 (H) 0.0 - 149.0 mg/dL   HDL 40.90 >39.00 mg/dL   VLDL 47.2 (H) 0.0 - 40.0 mg/dL   Total CHOL/HDL Ratio 3    NonHDL 70.73   Comprehensive metabolic panel  Result Value Ref Range   Sodium 140 135 - 145 mEq/L   Potassium 5.1 3.5 - 5.1 mEq/L   Chloride 104 96 - 112 mEq/L   CO2 30 19 - 32 mEq/L   Glucose, Bld 129 (H) 70 - 99 mg/dL   BUN 15 6 - 23 mg/dL   Creatinine, Ser 0.91 0.40 - 1.50 mg/dL   Total Bilirubin 0.7 0.2 - 1.2 mg/dL   Alkaline Phosphatase 64 39 - 117 U/L   AST 16 0 - 37 U/L   ALT 22 0 - 53 U/L   Total Protein 6.5 6.0 - 8.3 g/dL   Albumin 4.2 3.5 - 5.2 g/dL   GFR 81.73 >60.00 mL/min   Calcium 9.1 8.4 - 10.5 mg/dL  LDL cholesterol, direct  Result Value Ref Range   Direct LDL 45.0 mg/dL    Assessment & Plan:  This visit occurred during the SARS-CoV-2 public health emergency.  Safety protocols were in place, including screening questions prior to the visit, additional usage of staff PPE, and extensive cleaning of exam room while observing appropriate contact time as indicated for disinfecting solutions.   Problem List Items Addressed This Visit     Health maintenance examination - Primary (Chronic)    Preventative protocols reviewed and updated unless pt declined. Discussed healthy diet and lifestyle.       Dyslipidemia associated with type 2 diabetes  mellitus (HCC)    Chronic LDL at goal on crestor, triglycerides high - continue fish oil. The ASCVD Risk score Mikey Bussing DC Jr., et al., 2013) failed to calculate for the following reasons:   The valid total cholesterol range is 130 to 320 mg/dL       Gout    Stable period off gout lowering medication.       Essential hypertension    Chronic, adequate. Continue current regimen.       Type 2 diabetes mellitus with other specified complication (HCC)    Chronic, stable. Discussed metformin XR dosing as he forgets am dose - he will instead renew efforts to take am dose. Will continue novolin 70/30 10u PRN fasting cbg >150.       Vitamin B12 deficiency    Continue oral replacement.       Obesity, Class I, BMI 30-34.9    Encouraged healthy diet and lifestyle choices to affect sustainable weight loss.       Type 2 diabetes mellitus with diabetic neuropathy, unspecified ([place])    Presumed diabetic neuropathy, not responsive to gabapentin 342m tid dose.  Will trial amitriptyline 259mnightly + alpha lipoic acid 60053maily.  Monitor for sedation and dry mouth.       [facility] ectasia ([facility]  Consider updated thoracic imaging.       Diabetic cataract of both eyes ([facility]   Meds ordered this encounter  Medications   amitriptyline (ELAVIL) 25 MG tablet    Sig: Take 1 tablet (25 mg total) by mouth at bedtime.    Dispense:  30 tablet    Refill:  3   Alpha-Lipoic Acid 600 MG CAPS    Sig: Take 1 capsule (600 mg total) by mouth daily.    Dispense:  30 capsule    Refill:  3   No orders of the defined types were placed in this encounter.   Patient instructions: Consider switching to extended release metformin.  For burning of feet - try amitriptyline 61m17m night time as well as over the counter supplement alpha lipoic acid 600mg55mly. Try both for 1 month. Watch for dry mouth as a side effect of amitriptyline.  If interested, check with pharmacy about new 2 shot shingles  series (shingrix).  Return as needed or in 6 months for diabetes follow up visit.   Follow up plan: Return in about 6 months (around 01/29/2022), or if symptoms worsen or fail to improve, for follow up visit.  JavieRia Bush

## 2021-07-29 NOTE — Patient Instructions (Addendum)
Consider switching to extended release metformin.  For burning of feet - try amitriptyline '25mg'$  at night time as well as over the counter supplement alpha lipoic acid '600mg'$  daily. Try both for 1 month. Watch for dry mouth as a side effect of amitriptyline.  If interested, check with pharmacy about new 2 shot shingles series (shingrix).  Return as needed or in 6 months for diabetes follow up visit.   Health Maintenance After Age 77 After age 77, you are at a higher risk for certain long-term diseases and infections as well as injuries from falls. Falls are a major cause of broken bones and head injuries in people who are older than age 62. Getting regular preventive care can help to keep you healthy and well. Preventive care includes getting regular testing and making lifestyle changes as recommended by your health care provider. Talk with your health care provider about: Which screenings and tests you should have. A screening is a test that checks for a disease when you have no symptoms. A diet and exercise plan that is right for you. What should I know about screenings and tests to prevent falls? Screening and testing are the best ways to find a health problem early. Early diagnosis and treatment give you the best chance of managing medical conditions that are common after age 18. Certain conditions and lifestyle choices may make you more likely to have a fall. Your health care provider may recommend: Regular vision checks. Poor vision and conditions such as cataracts can make you more likely to have a fall. If you wear glasses, make sure to get your prescription updated if your vision changes. Medicine review. Work with your health care provider to regularly review all of the medicines you are taking, including over-the-counter medicines. Ask your health care provider about any side effects that may make you more likely to have a fall. Tell your health care provider if any medicines that you take make  you feel dizzy or sleepy. Osteoporosis screening. Osteoporosis is a condition that causes the bones to get weaker. This can make the bones weak and cause them to break more easily. Blood pressure screening. Blood pressure changes and medicines to control blood pressure can make you feel dizzy. Strength and balance checks. Your health care provider may recommend certain tests to check your strength and balance while standing, walking, or changing positions. Foot health exam. Foot pain and numbness, as well as not wearing proper footwear, can make you more likely to have a fall. Depression screening. You may be more likely to have a fall if you have a fear of falling, feel emotionally low, or feel unable to do activities that you used to do. Alcohol use screening. Using too much alcohol can affect your balance and may make you more likely to have a fall. What actions can I take to lower my risk of falls? General instructions Talk with your health care provider about your risks for falling. Tell your health care provider if: You fall. Be sure to tell your health care provider about all falls, even ones that seem minor. You feel dizzy, sleepy, or off-balance. Take over-the-counter and prescription medicines only as told by your health care provider. These include any supplements. Eat a healthy diet and maintain a healthy weight. A healthy diet includes low-fat dairy products, low-fat (lean) meats, and fiber from whole grains, beans, and lots of fruits and vegetables. Home safety Remove any tripping hazards, such as rugs, cords, and clutter. Install safety  equipment such as grab bars in bathrooms and safety rails on stairs. Keep rooms and walkways well-lit. Activity  Follow a regular exercise program to stay fit. This will help you maintain your balance. Ask your health care provider what types of exercise are appropriate for you. If you need a cane or walker, use it as recommended by your health  care provider. Wear supportive shoes that have nonskid soles.  Lifestyle Do not drink alcohol if your health care provider tells you not to drink. If you drink alcohol, limit how much you have: 0-1 drink a day for women. 0-2 drinks a day for men. Be aware of how much alcohol is in your drink. In the [place] one drink equals one typical bottle of beer (12 oz), one-half glass of wine (5 oz), or one shot of hard liquor (1 oz). Do not use any products that contain nicotine or tobacco, such as cigarettes and e-cigarettes. If you need help quitting, ask your health care provider. Summary Having a healthy lifestyle and getting preventive care can help to protect your health and wellness after age 9. Screening and testing are the best way to find a health problem early and help you avoid having a fall. Early diagnosis and treatment give you the best chance for managing medical conditions that are more common for people who are older than age 35. Falls are a major cause of broken bones and head injuries in people who are older than age 17. Take precautions to prevent a fall at home. Work with your health care provider to learn what changes you can make to improve your health and wellness and to prevent falls. This information is not intended to replace advice given to you by your health care provider. Make sure you discuss any questions you have with your healthcare provider. Document Revised: 11/12/2020 Document Reviewed: 11/12/2020 Elsevier Patient Education  2022 [employer].

## 2021-07-29 NOTE — Assessment & Plan Note (Signed)
Preventative protocols reviewed and updated unless pt declined. Discussed healthy diet and lifestyle.  

## 2021-07-29 NOTE — Assessment & Plan Note (Signed)
Presumed diabetic neuropathy, not responsive to gabapentin '300mg'$  tid dose.  Will trial amitriptyline '25mg'$  nightly + alpha lipoic acid '600mg'$  daily.  Monitor for sedation and dry mouth.

## 2021-07-29 NOTE — Assessment & Plan Note (Signed)
Stable period off gout lowering medication.

## 2021-07-29 NOTE — Assessment & Plan Note (Signed)
Chronic, stable. Discussed metformin XR dosing as he forgets am dose - he will instead renew efforts to take am dose. Will continue novolin 70/30 10u PRN fasting cbg >150.

## 2021-07-29 NOTE — Assessment & Plan Note (Signed)
Chronic LDL at goal on crestor, triglycerides high - continue fish oil. The ASCVD Risk score Devin Bussing DC Jr., et al., 2013) failed to calculate for the following reasons:   The valid total cholesterol range is 130 to 320 mg/dL

## 2021-07-29 NOTE — Assessment & Plan Note (Signed)
Continue oral replacement.

## 2021-07-29 NOTE — Chronic Care Management (AMB) (Addendum)
Chronic Care Management Pharmacy Assistant   Name: Devin Huffman  MRN: [id number] DOB: Jul 12, 1946  Reason for Encounter: Medication Adherence and Delivery Coordination   Recent office visits:  07/29/21- PCP- Patient presented for physical / annual wellness visit. Start 1 month trial of amitriptyline 25 mg nightly and alpha lipoic acid 600 mg daily for diabetic neuropathy. Diabetes chronic. Patient forgetting AM dose metformin and will attempt to remember to take all doses. Discussed switching to extended release metformin if still forgetful. Continue novolog 10 units PRN FBG > 150. Follow up in 6 months.  06/27/21- CCM- Restarted Novolin 70/30- 10 units every monring.   Recent consult visits:  05/18/21 - Otolaryngology- No additional data available  Hospital visits:  None in previous 6 months  Medications: Outpatient Encounter Medications as of 07/29/2021  Medication Sig   amLODipine (NORVASC) 10 MG tablet Take 1 tablet (10 mg total) by mouth at bedtime.   Ascorbic Acid (VITAMIN C) 1000 MG tablet Take 1,000 mg by mouth every evening.    aspirin EC 81 MG tablet Take 81 mg by mouth every evening.    B Complex-Folic Acid (B COMPLEX-VITAMIN B12 PO) Take 1 tablet by mouth every evening.    blood glucose meter kit and supplies KIT Dispense based on patient and insurance preference. Use to check sugars up to twice daily as directed (E11.69).   Cholecalciferol (VITAMIN D3) 50 MCG (2000 UT) capsule Take 50 mcg by mouth every evening.    Coenzyme Q10 50 MG CAPS Take 1 capsule (50 mg total) by mouth daily.   glucose blood (GLUCOSE METER TEST) test strip Use as instructed to check sugars twice daily as needed E11.69   indomethacin (INDOCIN) 50 MG capsule Take 1 capsule (50 mg total) by mouth 3 (three) times daily as needed (gout flare).   insulin aspart protamine - aspart (NOVOLOG MIX 70/30 FLEXPEN) (70-30) 100 UNIT/ML FlexPen Inject 0.1 mLs (10 Units total) into the skin daily with breakfast.    Insulin Pen Needle (RELION PEN NEEDLES) 31G X 6 MM MISC Use as directed to check sugars twice daily   metFORMIN (GLUCOPHAGE) 500 MG tablet TAKE ONE TABLET BY MOUTH EVERY MORNING and TAKE TWO TABLETS BY MOUTH EVERY EVENING   metoprolol succinate (TOPROL-XL) 25 MG 24 hr tablet Take 1 tablet (25 mg total) by mouth at bedtime.   Omega-3 Fatty Acids (FISH OIL) 1000 MG CAPS Take 1 capsule (1,000 mg total) by mouth 2 (two) times a day.   omeprazole (PRILOSEC) 20 MG capsule TAKE ONE CAPSULE BY MOUTH ONCE DAILY AS NEEDED   rosuvastatin (CRESTOR) 5 MG tablet Take 1 tablet (5 mg total) by mouth daily.   vitamin B-12 (CYANOCOBALAMIN) 1000 MCG tablet Take 1,000 mcg by mouth daily.   [DISCONTINUED] lisinopril (PRINIVIL,ZESTRIL) 5 MG tablet Take 1 tablet (5 mg total) by mouth daily.   No facility-administered encounter medications on file as of 07/29/2021.   BP Readings from Last 3 Encounters:  02/23/21 140/70  01/12/21 (!) 162/80  07/12/20 126/76    Lab Results  Component Value Date   HGBA1C 6.9 (H) 07/21/2021    Attempted contact with [name] 3 times. Unsuccessful outreach.   Recent OV, Consult or Hospital visit: 07/29/21- PCP- AWV Patient was started on a 1 month trial of amitriptyline 25 mg nightly and alpha lipoic acid 600 mg daily for diabetic neuropathy. 06/27/21 Restarted 10 units every morning Novolin 70/30 PRN FBG > 150. 05/18/21 - Otolaryngology  Last adherence delivery  date: 05/09/21      Patient is due for next adherence delivery on: 08/09/21  Multiple attempts made to reach patient on 07/29/21 and 08/02/21. Unsuccessful outreach. Will refill based off of last adherence fill.   This delivery to include: Vials  90 Days  CoQ 10 100 mg-  1  tablet daily Vitamin C 1000 mg- 1 Tablet every evening Vitamin B12 1000 mcg - Take 1 tablet by mouth every morning  Amlodipine 10 mg - 1 tablet at bedtime  Metformin 500 mg- 1 tablet every morning, 2 tablets every evening  Metoprolol 25 mg- 1 tablet  daily Fish Oil 1000 mg -  1 a day morning Vitamin D3 50 Mcg- 1 capsule every evening  Rosuvastatin 5 mg - 1 tablet daily Amitriptyline - 30 DS (New - patient trial) Alpha lipoic acid - 30 DS (New - patient trial)  Patient declined the following medications last month: Indomethacin 50 mg - take 1 tablet 3 times daily- uses PRN  Novolin - PRN (picked up at [facility])  Refills requested from PCP include: Rosuvastatin and Metoprolol. [employer] sent request.  Delivery scheduled for 08/09/21. Unable to speak with patient to confirm date.   Debbora Dus, CPP notified  Margaretmary Dys, [place] Pharmacy Assistant [phone]  I have reviewed the care management and care coordination activities outlined in this encounter and I am certifying that I agree with the content of this note. No further action required.  Debbora Dus, PharmD Clinical Pharmacist [facility] at [facility] [phone]

## 2021-07-29 NOTE — Assessment & Plan Note (Signed)
Chronic, adequate. Continue current regimen.  

## 2021-07-29 NOTE — Assessment & Plan Note (Signed)
Consider updated thoracic imaging.

## 2021-08-10 DIAGNOSIS — L820 Inflamed seborrheic keratosis: Secondary | ICD-10-CM | POA: Diagnosis not present

## 2021-08-10 DIAGNOSIS — D485 Neoplasm of uncertain behavior of skin: Secondary | ICD-10-CM | POA: Diagnosis not present

## 2021-08-10 DIAGNOSIS — L739 Follicular disorder, unspecified: Secondary | ICD-10-CM | POA: Diagnosis not present

## 2021-08-10 DIAGNOSIS — C44519 Basal cell carcinoma of skin of other part of trunk: Secondary | ICD-10-CM | POA: Diagnosis not present

## 2021-08-10 DIAGNOSIS — L821 Other seborrheic keratosis: Secondary | ICD-10-CM | POA: Diagnosis not present

## 2021-08-10 DIAGNOSIS — D1801 Hemangioma of skin and subcutaneous tissue: Secondary | ICD-10-CM | POA: Diagnosis not present

## 2021-08-10 DIAGNOSIS — L578 Other skin changes due to chronic exposure to nonionizing radiation: Secondary | ICD-10-CM | POA: Diagnosis not present

## 2021-08-10 DIAGNOSIS — Z86007 Personal history of in-situ neoplasm of skin: Secondary | ICD-10-CM | POA: Diagnosis not present

## 2021-08-10 DIAGNOSIS — D235 Other benign neoplasm of skin of trunk: Secondary | ICD-10-CM | POA: Diagnosis not present

## 2021-08-10 DIAGNOSIS — Z85828 Personal history of other malignant neoplasm of skin: Secondary | ICD-10-CM | POA: Diagnosis not present

## 2021-10-07 ENCOUNTER — Telehealth: Payer: Self-pay | Admitting: Family Medicine

## 2021-10-07 NOTE — Telephone Encounter (Signed)
Patients wife has called stating they got a call stating he needed to set up his A1C lab appointment. Appointment has been made for Tuesday 11.1.22, please place order.  They will be going to [facility] to complete these labs

## 2021-10-08 NOTE — Telephone Encounter (Signed)
I'm not sure why he got phone call.  He's not due for A1c as last checked 07/21/2021.  At CPE 07/2021 I asked him to return in 6 months for f/u visit.  May cancel upcoming appt for Tuesday.

## 2021-10-10 NOTE — Telephone Encounter (Signed)
Called patient and left vm of canceling labs for 11/1

## 2021-10-10 NOTE — Telephone Encounter (Signed)
Noted  

## 2021-10-11 ENCOUNTER — Other Ambulatory Visit: Payer: Medicare HMO

## 2021-10-26 ENCOUNTER — Other Ambulatory Visit: Payer: Self-pay | Admitting: Family Medicine

## 2021-10-27 ENCOUNTER — Telehealth: Payer: Self-pay

## 2021-10-27 NOTE — Progress Notes (Addendum)
Chronic Care Management Pharmacy Assistant   Name: Devin Huffman  MRN: [id number] DOB: 11-17-1946  Reason for Encounter: CCM (Medication Adherence and Delivery Coordination)   Recent office visits:  07/29/2021 - Ria Bush, MD - Patient presented for annual exam. Start: Alpha-Lipoic Acid 600 MG CAPS and amitriptyline (ELAVIL) 25 MG tablet.   Recent consult visits:  08/10/2021 - Dermatology - Patient presented for seborrheic keratosis. Procedures: Malignant Destruction TrunkArmsLegs 6-72m.  Hospital visits:  None in previous 6 months  Medications: Outpatient Encounter Medications as of 10/27/2021  Medication Sig   Alpha-Lipoic Acid 600 MG CAPS Take 1 capsule (600 mg total) by mouth daily.   amitriptyline (ELAVIL) 25 MG tablet Take 1 tablet (25 mg total) by mouth at bedtime.   amLODipine (NORVASC) 10 MG tablet Take 1 tablet (10 mg total) by mouth at bedtime.   Ascorbic Acid (VITAMIN C) 1000 MG tablet Take 1,000 mg by mouth every evening.    aspirin EC 81 MG tablet Take 81 mg by mouth every evening.    B Complex-Folic Acid (B COMPLEX-VITAMIN B12 PO) Take 1 tablet by mouth every evening.    blood glucose meter kit and supplies KIT Dispense based on patient and insurance preference. Use to check sugars up to twice daily as directed (E11.69).   Cholecalciferol (VITAMIN D3) 50 MCG (2000 UT) capsule Take 50 mcg by mouth every evening.    Coenzyme Q10 50 MG CAPS Take 1 capsule (50 mg total) by mouth daily.   glucose blood (GLUCOSE METER TEST) test strip Use as instructed to check sugars twice daily as needed E11.69   indomethacin (INDOCIN) 50 MG capsule Take 1 capsule (50 mg total) by mouth 3 (three) times daily as needed (gout flare).   insulin aspart protamine - aspart (NOVOLOG MIX 70/30 FLEXPEN) (70-30) 100 UNIT/ML FlexPen Inject 0.1 mLs (10 Units total) into the skin daily with breakfast. (Patient taking differently: Inject 10 Units into the skin daily with breakfast. Sliding  scale)   Insulin Pen Needle (RELION PEN NEEDLES) 31G X 6 MM MISC Use as directed to check sugars twice daily   metFORMIN (GLUCOPHAGE) 500 MG tablet TAKE ONE TABLET BY MOUTH EVERY MORNING and TAKE TWO TABLETS BY MOUTH EVERY EVENING   metoprolol succinate (TOPROL-XL) 25 MG 24 hr tablet TAKE ONE TABLET BY MOUTH EVERYDAY AT BEDTIME   Omega-3 Fatty Acids (FISH OIL) 1000 MG CAPS Take 1 capsule (1,000 mg total) by mouth 2 (two) times a day.   omeprazole (PRILOSEC) 20 MG capsule TAKE ONE CAPSULE BY MOUTH ONCE DAILY AS NEEDED   rosuvastatin (CRESTOR) 5 MG tablet TAKE ONE TABLET BY MOUTH ONCE DAILY   vitamin B-12 (CYANOCOBALAMIN) 1000 MCG tablet Take 1,000 mcg by mouth daily.   [DISCONTINUED] lisinopril (PRINIVIL,ZESTRIL) 5 MG tablet Take 1 tablet (5 mg total) by mouth daily.   No facility-administered encounter medications on file as of 10/27/2021.   BP Readings from Last 3 Encounters:  07/29/21 140/74  02/23/21 140/70  01/12/21 (!) 162/80    Lab Results  Component Value Date   HGBA1C 6.9 (H) 07/21/2021    Recent OV, Consult or Hospital visit:  Recent medication changes indicated:  Start: Alpha-Lipoic Acid 600 MG CAPS and amitriptyline (ELAVIL) 25 MG tablet.   Last adherence delivery date: 08/09/2021  Patient is due for next adherence delivery on: 11/08/2021  Spoke with patient on 10/27/2021 reviewed medications and coordinated delivery.   This delivery to include: Vials  90 Days   VIAL medications:  Vitamin C 1000 mg- 1 Tablet every evening Vitamin B12 1000 mcg - Take 1 tablet by mouth every morning  Amlodipine 10 mg - 1 tablet at bedtime  Fish Oil 1000 mg -  1 a day morning Metformin 500 mg- 1 tablet every morning, 2 tablets every evening  Rosuvastatin 5 mg - 1 tablet daily Metoprolol 25 mg- 1 tablet daily  Patient declined the following medications last month: CoQ 10 100 mg-  1  tablet daily - Has 90 remaining  Vitamin D3 50 Mcg- 1 capsule every evening - Two bottles  remaining  Any concerns about your medications? No  How often do you forget or accidentally miss a dose? Rarely  Do you use a pillbox? Yes  Is patient in packaging No  No refill request needed.  Confirmed delivery date of 11/08/2021, advised patient that pharmacy will contact them the morning of delivery.   Recent blood pressure readings are as follows: Patient states he does not take blood pressure readings.   Recent blood glucose readings are as follows:  In the morning before eating. 11/14 - 167 10/30 - 161  Annual wellness visit in last year? Yes 07/14/2021 Most Recent BP reading: 140/74 on 07/29/2021  If Diabetic: Most recent A1C reading: 6.9 on 07/21/2021 Last eye exam / retinopathy screening: 09/03/2020 Last diabetic foot exam: Up to date  Debbora Dus, CPP notified  Marijean Niemann, [place] Assistant [phone]  I have reviewed the care management and care coordination activities outlined in this encounter and I am certifying that I agree with the content of this note. No further action required.  Debbora Dus, PharmD Clinical Pharmacist [facility] at [facility] [phone]

## 2021-11-23 ENCOUNTER — Telehealth (INDEPENDENT_AMBULATORY_CARE_PROVIDER_SITE_OTHER): Payer: Medicare HMO | Admitting: Internal Medicine

## 2021-11-23 ENCOUNTER — Encounter: Payer: Self-pay | Admitting: Internal Medicine

## 2021-11-23 ENCOUNTER — Other Ambulatory Visit: Payer: Self-pay

## 2021-11-23 DIAGNOSIS — U071 COVID-19: Secondary | ICD-10-CM

## 2021-11-23 HISTORY — DX: COVID-19: U07.1

## 2021-11-23 MED ORDER — MOLNUPIRAVIR 200 MG PO CAPS: 4.0000 | ORAL_CAPSULE | Freq: Two times a day (BID) | ORAL | 0 refills | Status: DC

## 2021-11-23 NOTE — Assessment & Plan Note (Signed)
He seems to have fairly mild symptoms now Did have 5 day hospitalization at the beginning of the pandemic Only had 2 vaccines  Discussed EUA for medications--he would like to try this Given Rx for molnupiravir Discussed tylenol and anti tussives  ER if develops significant SOB

## 2021-11-23 NOTE — Progress Notes (Signed)
Subjective:    Patient ID: Devin Huffman, male    DOB: 30-Dec-1945, 75 y.o.   MRN: [id number]  HPI Video virtual visit for COVID infection Identification done Reviewed limitations and billing and he gave consent Participants--patient and wife in their home (she is also sick) and I am in my office  Started with symptoms 3 days ago Taste off some Some rhinorrhea and cough No chills or sweats---may feel a little hot Temp went to 99.5 at highest No headache, ear pain No SOB  Only had first 2 COVID vaccines---no booster  Current Outpatient Medications on File Prior to Visit  Medication Sig Dispense Refill   Alpha-Lipoic Acid 600 MG CAPS Take 1 capsule (600 mg total) by mouth daily. 30 capsule 3   amitriptyline (ELAVIL) 25 MG tablet Take 1 tablet (25 mg total) by mouth at bedtime. 30 tablet 3   amLODipine (NORVASC) 10 MG tablet Take 1 tablet (10 mg total) by mouth at bedtime. 90 tablet 3   Ascorbic Acid (VITAMIN C) 1000 MG tablet Take 1,000 mg by mouth every evening.      aspirin EC 81 MG tablet Take 81 mg by mouth every evening.      B Complex-Folic Acid (B COMPLEX-VITAMIN B12 PO) Take 1 tablet by mouth every evening.      blood glucose meter kit and supplies KIT Dispense based on patient and insurance preference. Use to check sugars up to twice daily as directed (E11.69). 1 each 0   Cholecalciferol (VITAMIN D3) 50 MCG (2000 UT) capsule Take 50 mcg by mouth every evening.      Coenzyme Q10 50 MG CAPS Take 1 capsule (50 mg total) by mouth daily.  0   glucose blood (GLUCOSE METER TEST) test strip Use as instructed to check sugars twice daily as needed E11.69 100 each 12   indomethacin (INDOCIN) 50 MG capsule Take 1 capsule (50 mg total) by mouth 3 (three) times daily as needed (gout flare). 30 capsule 0   insulin aspart protamine - aspart (NOVOLOG MIX 70/30 FLEXPEN) (70-30) 100 UNIT/ML FlexPen Inject 0.1 mLs (10 Units total) into the skin daily with breakfast. (Patient taking  differently: Inject 10 Units into the skin daily with breakfast. Sliding scale) 9 mL 1   Insulin Pen Needle (RELION PEN NEEDLES) 31G X 6 MM MISC Use as directed to check sugars twice daily 100 each 3   metFORMIN (GLUCOPHAGE) 500 MG tablet TAKE ONE TABLET BY MOUTH EVERY MORNING and TAKE TWO TABLETS BY MOUTH EVERY EVENING 270 tablet 0   metoprolol succinate (TOPROL-XL) 25 MG 24 hr tablet TAKE ONE TABLET BY MOUTH EVERYDAY AT BEDTIME 90 tablet 0   Omega-3 Fatty Acids (FISH OIL) 1000 MG CAPS Take 1 capsule (1,000 mg total) by mouth 2 (two) times a day.  0   omeprazole (PRILOSEC) 20 MG capsule TAKE ONE CAPSULE BY MOUTH ONCE DAILY AS NEEDED 90 capsule 0   rosuvastatin (CRESTOR) 5 MG tablet TAKE ONE TABLET BY MOUTH ONCE DAILY 90 tablet 0   vitamin B-12 (CYANOCOBALAMIN) 1000 MCG tablet Take 1,000 mcg by mouth daily.     [DISCONTINUED] lisinopril (PRINIVIL,ZESTRIL) 5 MG tablet Take 1 tablet (5 mg total) by mouth daily. 90 tablet 3   No current facility-administered medications on file prior to visit.    Allergies  Allergen Reactions   Lisinopril Hives and Swelling    ?angioedema - lip swelling, hives    Past Medical History:  Diagnosis Date   Cancer ([place])  skin cancer removed from nose   Cerumen impaction 2016   bilateral with otitis externa s/p ENT removal - [place]   GERD (gastroesophageal reflux disease)    occasional   Gout    ?due to L great toe pain, saw Dr Gladstone Lighter   History of chicken pox    Hyperlipidemia    Hypertension    Legg-Perthes disease    Right hip   Nasal septal deviation    monitoring - [place]   Pneumonia due to COVID-19 virus 01/21/2020   Prediabetes 03/22/2009   Sepsis ([place])    In March 2018    Past Surgical History:  Procedure Laterality Date   COLONOSCOPY  03/2011   2 tubular adenomas, rec rpt 5 yrs   COLONOSCOPY  01/2018   diverticulosis, rpt 5 yrs [facility])   HERNIA REPAIR     double hernia   LUMBAR FUSION  04/2013   transforaminal interbody fusion  L4/5 ([place]) for R L5 radiculopathy, 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   Hypertension Maternal Uncle    Stroke Maternal Uncle    Coronary artery disease Paternal Uncle        MI   Cancer Other        niece-2 types   Stroke Maternal Grandfather     Social History   Socioeconomic History   Marital status: Married    Spouse name: Not on file   Number of children: Not on file   Years of education: Not on file   Highest education level: Not on file  Occupational History   Occupation: part-time  Tobacco Use   Smoking status: Never   Smokeless tobacco: Never  Vaping Use   Vaping Use: Never used  Substance and Sexual Activity   Alcohol use: No   Drug use: No   Sexual activity: Yes  Other Topics Concern   Not on file  Social History Narrative   Caffeine: few cups/day   Lives with wife, no pets   Occupation-Structural Museum/gallery conservator, "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  No regular exercise.   Diet: "I love my ice cream. I can't live forever", some water   Social Determinants of Health   Financial Resource Strain: Low Risk    Difficulty of Paying Living Expenses: Not hard at all  Food Insecurity: No Food Insecurity   Worried About Charity fundraiser in the Last Year: Never true   [place] in the Last Year: Never true  Transportation Needs: No Transportation Needs   Lack of Transportation  (Medical): No   Lack of Transportation (Non-Medical): No  Physical Activity: Inactive   Days of Exercise per Week: 0 days   Minutes of Exercise per Session: 0 min  Stress: No Stress Concern Present   Feeling of  Stress : Not at all  Social Connections: Not on file  Intimate Partner Violence: Not At Risk   Fear of Current or Ex-Partner: No   Emotionally Abused: No   Physically Abused: No   Sexually Abused: No   Review of Systems Eating fine No N/V    Objective:   Physical Exam Constitutional:      Appearance: Normal appearance.  Pulmonary:     Effort: Pulmonary effort is normal. No respiratory distress.  Neurological:     Mental Status: He is alert.           Assessment & Plan:

## 2022-01-02 ENCOUNTER — Encounter: Payer: Self-pay | Admitting: Family Medicine

## 2022-01-23 ENCOUNTER — Other Ambulatory Visit: Payer: Self-pay | Admitting: Family Medicine

## 2022-01-24 ENCOUNTER — Telehealth: Payer: Self-pay

## 2022-01-24 NOTE — Progress Notes (Addendum)
Chronic Care Management Pharmacy Assistant   Name: Devin Huffman  MRN: [id number] DOB: 25-Jun-1946  Reason for Encounter: CCM (Medication Adherence and Delivery Coordination)   Recent office visits:  11/23/2021 - Viviana Simpler, MD - Video Visit - Patient presented for Covid positive. Start: molnupiravir EUA (LAGEVRIO) 200 MG CAPS capsule.  Recent consult visits:  None since last CCM contact  Hospital visits:  None in previous 6 months  Medications: Outpatient Encounter Medications as of 01/24/2022  Medication Sig   Alpha-Lipoic Acid 600 MG CAPS Take 1 capsule (600 mg total) by mouth daily.   amitriptyline (ELAVIL) 25 MG tablet Take 1 tablet (25 mg total) by mouth at bedtime.   amLODipine (NORVASC) 10 MG tablet Take 1 tablet (10 mg total) by mouth at bedtime.   Ascorbic Acid (VITAMIN C) 1000 MG tablet Take 1,000 mg by mouth every evening.    aspirin EC 81 MG tablet Take 81 mg by mouth every evening.    B Complex-Folic Acid (B COMPLEX-VITAMIN B12 PO) Take 1 tablet by mouth every evening.    blood glucose meter kit and supplies KIT Dispense based on patient and insurance preference. Use to check sugars up to twice daily as directed (E11.69).   Cholecalciferol (VITAMIN D3) 50 MCG (2000 UT) capsule Take 50 mcg by mouth every evening.    Coenzyme Q10 50 MG CAPS Take 1 capsule (50 mg total) by mouth daily.   glucose blood (GLUCOSE METER TEST) test strip Use as instructed to check sugars twice daily as needed E11.69   indomethacin (INDOCIN) 50 MG capsule Take 1 capsule (50 mg total) by mouth 3 (three) times daily as needed (gout flare).   insulin aspart protamine - aspart (NOVOLOG MIX 70/30 FLEXPEN) (70-30) 100 UNIT/ML FlexPen Inject 0.1 mLs (10 Units total) into the skin daily with breakfast. (Patient taking differently: Inject 10 Units into the skin daily with breakfast. Sliding scale)   Insulin Pen Needle (RELION PEN NEEDLES) 31G X 6 MM MISC Use as directed to check sugars twice daily    metFORMIN (GLUCOPHAGE) 500 MG tablet TAKE ONE TABLET BY MOUTH EVERY MORNING and TAKE TWO TABLETS BY MOUTH EVERY EVENING   metoprolol succinate (TOPROL-XL) 25 MG 24 hr tablet TAKE ONE TABLET BY MOUTH EVERYDAY AT BEDTIME   molnupiravir EUA (LAGEVRIO) 200 MG CAPS capsule Take 4 capsules (800 mg total) by mouth in the morning and at bedtime.   Omega-3 Fatty Acids (FISH OIL) 1000 MG CAPS Take 1 capsule (1,000 mg total) by mouth 2 (two) times a day.   omeprazole (PRILOSEC) 20 MG capsule TAKE ONE CAPSULE BY MOUTH ONCE DAILY AS NEEDED   rosuvastatin (CRESTOR) 5 MG tablet TAKE ONE TABLET BY MOUTH ONCE DAILY   vitamin B-12 (CYANOCOBALAMIN) 1000 MCG tablet Take 1,000 mcg by mouth daily.   [DISCONTINUED] lisinopril (PRINIVIL,ZESTRIL) 5 MG tablet Take 1 tablet (5 mg total) by mouth daily.   No facility-administered encounter medications on file as of 01/24/2022.   BP Readings from Last 3 Encounters:  07/29/21 140/74  02/23/21 140/70  01/12/21 (!) 162/80    Lab Results  Component Value Date   HGBA1C 6.9 (H) 07/21/2021    Recent OV, Consult or Hospital visit:  No medication changes indicated  Last adherence delivery date: 11/08/2021      Patient is due for next adherence delivery on: 02/06/2022  Spoke with patient on 01/24/2022 reviewed medications and coordinated delivery.  Patient declined the following medications this month:This delivery to include: Vials  90 Days    VIAL medications: Metoprolol 25 mg- 1 tablet daily Rosuvastatin 5 mg - 1 tablet daily Metformin 500 mg- 1 tablet every morning, 2 tablets every evening  Vitamin C 1000 mg- 1 Tablet every evening Vitamin B12 1000 mcg - Take 1 tablet by mouth every morning  Amlodipine 10 mg - 1 tablet at bedtime  Fish Oil 1000 mg -  1 a day morning CoQ 10 100 mg-  1  tablet daily   Patient declined the following medications last month: Vitamin D3 50 Mcg- 1 capsule every evening - 1.5 bottles remaining   Any concerns about your  medications? No  How often do you forget or accidentally miss a dose? Rarely  Do you use a pillbox? Yes  Is patient in packaging No   Refills requested from providers include: Metoprolol 25 mg- 1 tablet daily Rosuvastatin 5 mg - 1 tablet daily Metformin 500 mg- 1 tablet every morning, 2 tablets every evening  Amlodipine 10 mg - 1 tablet at bedtime   Confirmed delivery date of 02/06/2022, advised patient that pharmacy will contact them the morning of delivery.   Recent blood pressure readings are as follows: Patient states he does not take blood pressure readings.   Recent blood glucose readings are as follows: Patient does not have them with him at this time.   Annual wellness visit in last year? Yes 07/14/2021 Most Recent BP reading: 140/74 on 07/29/2021  If Diabetic: Most recent A1C reading: 6.9 on 07/21/2021 Last eye exam / retinopathy screening: 08/03/2020 Last diabetic foot exam: 01/12/2021  Debbora Dus, CPP notified  Marijean Niemann, [place] Assistant [phone]  Time Spent: 54 Minutes  I have reviewed the care management and care coordination activities outlined in this encounter and I am certifying that I agree with the content of this note. No further action required.  Debbora Dus, PharmD Clinical Pharmacist [place] [facility] at [facility] [phone]

## 2022-01-30 ENCOUNTER — Ambulatory Visit: Payer: Medicare HMO | Admitting: Family Medicine

## 2022-02-02 DIAGNOSIS — H2513 Age-related nuclear cataract, bilateral: Secondary | ICD-10-CM | POA: Diagnosis not present

## 2022-02-02 DIAGNOSIS — D23112 Other benign neoplasm of skin of right lower eyelid, including canthus: Secondary | ICD-10-CM | POA: Diagnosis not present

## 2022-02-02 DIAGNOSIS — H04123 Dry eye syndrome of bilateral lacrimal glands: Secondary | ICD-10-CM | POA: Diagnosis not present

## 2022-02-02 DIAGNOSIS — H35033 Hypertensive retinopathy, bilateral: Secondary | ICD-10-CM | POA: Diagnosis not present

## 2022-02-02 DIAGNOSIS — H524 Presbyopia: Secondary | ICD-10-CM | POA: Diagnosis not present

## 2022-02-02 LAB — HM DIABETES EYE EXAM

## 2022-02-07 ENCOUNTER — Encounter: Payer: Self-pay | Admitting: Family Medicine

## 2022-02-15 ENCOUNTER — Encounter: Payer: Self-pay | Admitting: Family Medicine

## 2022-02-15 ENCOUNTER — Other Ambulatory Visit: Payer: Self-pay

## 2022-02-15 ENCOUNTER — Ambulatory Visit (INDEPENDENT_AMBULATORY_CARE_PROVIDER_SITE_OTHER): Payer: Medicare HMO | Admitting: Family Medicine

## 2022-02-15 VITALS — BP 126/70 | HR 66 | Temp 97.5°F | Ht 70.0 in | Wt 235.4 lb

## 2022-02-15 DIAGNOSIS — E1169 Type 2 diabetes mellitus with other specified complication: Secondary | ICD-10-CM | POA: Diagnosis not present

## 2022-02-15 DIAGNOSIS — Z794 Long term (current) use of insulin: Secondary | ICD-10-CM

## 2022-02-15 DIAGNOSIS — E1140 Type 2 diabetes mellitus with diabetic neuropathy, unspecified: Secondary | ICD-10-CM | POA: Diagnosis not present

## 2022-02-15 DIAGNOSIS — E538 Deficiency of other specified B group vitamins: Secondary | ICD-10-CM

## 2022-02-15 LAB — POCT GLYCOSYLATED HEMOGLOBIN (HGB A1C): Hemoglobin A1C: 7.2 % — AB (ref 4.0–5.6)

## 2022-02-15 MED ORDER — TRULICITY 0.75 MG/0.5ML ~~LOC~~ SOAJ: 0.7500 mg | SUBCUTANEOUS | 0 refills | Status: DC

## 2022-02-15 MED ORDER — TRULICITY 1.5 MG/0.5ML ~~LOC~~ SOAJ: 1.5000 mg | SUBCUTANEOUS | 11 refills | Status: DC

## 2022-02-15 MED ORDER — METFORMIN HCL ER 750 MG PO TB24: 1500.0000 mg | ORAL_TABLET | Freq: Every day | ORAL | 1 refills | Status: DC

## 2022-02-15 NOTE — Patient Instructions (Addendum)
Once you run out of current metformin, start metformin XR '750mg'$  2 tablets at bedtime.  ?I have sent trulicity weekly injection to your pharmacy to price out. Take 0.'75mg'$  weekly for the first month and if doing well, may increase to 1.'5mg'$  weekly. This will be in place of novolog 70/30.  ?Return as needed or after 07/29/2022 for physical.  ?Let us know sooner if any trouble getting medicines or tolerating changes.  ?

## 2022-02-15 NOTE — Assessment & Plan Note (Addendum)
Chronic, mildly deteriorated.  ?He forgets AM metformin dose - will change metformin to XR '750mg'$  and take 2 tablets at night time, update with effect. ?He takes novolog 70/30 PRN when fasting sugars >160 - discussed this is not appropriate use of insulin. ?Discussed options - recommend trial of weekly GLP1RA reviewing mechanism of action and possible side effects to watch for - will send trulicity 0.'75mg'$  x 1 month then 1.'5mg'$  weekly afterwards, update [place] if difficulty filling or any concerns. No fmhx thyroid cancer.  ?RTC 4 mo f/u visit.  ?Declines DMSE.  ?

## 2022-02-15 NOTE — Assessment & Plan Note (Signed)
Ongoing difficulty with symptoms of diabetic neuropathy.  ?Did not respond to gabapentin or alpha lipoic acid.  ?Discussed trial TCA - he declines for now. ?Latest B12 normal (07/2021) ?Consider further B vitamin testing at f/u visit.  ?

## 2022-02-15 NOTE — Progress Notes (Signed)
Patient ID: Devin Huffman, male    DOB: 1946/08/17, 76 y.o.   MRN: [id number]  This visit was conducted in person.  BP 126/70    Pulse 66    Temp (!) 97.5 F (36.4 C) (Temporal)    Ht 5' 10"  (1.778 m)    Wt 235 lb 6 oz (106.8 kg)    SpO2 95%    BMI 33.77 kg/m    CC: 6 mo DM f/u visit  Subjective:   HPI: Devin Huffman is a 76 y.o. male presenting on 02/15/2022 for Diabetes (Here for 6 mo f/u.  Pt states he usually misses AM dose of metformin. )   COVID19 infection 11/2021 - saw Dr Silvio Pate, Rx molnupiravir with complete resolution of symptoms.  Recent GI illness a few weeks ago - symptoms also have resolved.   He drank a yoohoo this morning.   DM - does not regularly check sugars - fasting earlier this week 180s. Compliant with antihyperglycemic regimen which includes: metformin 552m/1000mg daily (often misses am dose) and novolog 70/30 with breakfast - uses 50 units of insulin PRN when sugars >160. Denies low sugars or hypoglycemic symptoms. Denies blurry vision. Last diabetic eye exam 01/2022. Glucometer brand: Relion. Last foot exam: 01/2021 - DUE. DSME: declines. ++ bilateral symmetric length dependent lower extremity neuropathy, feels better at night time - gabapentin was ineffective. Last visit we started alpha lipoic acid - no benefit. No fmhx thyroid cancer.  Lab Results  Component Value Date   HGBA1C 7.2 (A) 02/15/2022   Diabetic Foot Exam - Simple   Simple Foot Form Diabetic Foot exam was performed with the following findings: Yes 02/15/2022 10:37 AM  Visual Inspection No deformities, no ulcerations, no other skin breakdown bilaterally: Yes Sensation Testing See comments: Yes Pulse Check Posterior Tibialis and Dorsalis pulse intact bilaterally: Yes Comments Decreased sensation bilateral soles to monofilament testing 1+ DP bilaterally    Lab Results  Component Value Date   MICROALBUR 1.0 07/12/2020        Relevant past medical, surgical, family and social history  reviewed and updated as indicated. Interim medical history since our last visit reviewed. Allergies and medications reviewed and updated. Outpatient Medications Prior to Visit  Medication Sig Dispense Refill   amitriptyline (ELAVIL) 25 MG tablet Take 1 tablet (25 mg total) by mouth at bedtime. 30 tablet 3   amLODipine (NORVASC) 10 MG tablet TAKE ONE TABLET BY MOUTH EVERYDAY AT BEDTIME 90 tablet 3   Ascorbic Acid (VITAMIN C) 1000 MG tablet Take 1,000 mg by mouth every evening.      aspirin EC 81 MG tablet Take 81 mg by mouth every evening.      B Complex-Folic Acid (B COMPLEX-VITAMIN B12 PO) Take 1 tablet by mouth every evening.      blood glucose meter kit and supplies KIT Dispense based on patient and insurance preference. Use to check sugars up to twice daily as directed (E11.69). 1 each 0   Cholecalciferol (VITAMIN D3) 50 MCG (2000 UT) capsule Take 50 mcg by mouth every evening.      Coenzyme Q10 50 MG CAPS Take 1 capsule (50 mg total) by mouth daily.  0   glucose blood (GLUCOSE METER TEST) test strip Use as instructed to check sugars twice daily as needed E11.69 100 each 12   indomethacin (INDOCIN) 50 MG capsule Take 1 capsule (50 mg total) by mouth 3 (three) times daily as needed (gout flare). 30 capsule 0  insulin aspart protamine - aspart (NOVOLOG MIX 70/30 FLEXPEN) (70-30) 100 UNIT/ML FlexPen Inject 0.1 mLs (10 Units total) into the skin daily with breakfast. (Patient taking differently: Inject 10 Units into the skin daily with breakfast. Sliding scale) 9 mL 1   Insulin Pen Needle (RELION PEN NEEDLES) 31G X 6 MM MISC Use as directed to check sugars twice daily 100 each 3   metoprolol succinate (TOPROL-XL) 25 MG 24 hr tablet TAKE ONE TABLET BY MOUTH EVERYDAY AT BEDTIME 90 tablet 0   molnupiravir EUA (LAGEVRIO) 200 MG CAPS capsule Take 4 capsules (800 mg total) by mouth in the morning and at bedtime. 40 capsule 0   Omega-3 Fatty Acids (FISH OIL) 1000 MG CAPS Take 1 capsule (1,000 mg total)  by mouth 2 (two) times a day.  0   omeprazole (PRILOSEC) 20 MG capsule TAKE ONE CAPSULE BY MOUTH ONCE DAILY AS NEEDED 90 capsule 0   rosuvastatin (CRESTOR) 5 MG tablet TAKE ONE TABLET BY MOUTH ONCE DAILY 90 tablet 0   vitamin B-12 (CYANOCOBALAMIN) 1000 MCG tablet Take 1,000 mcg by mouth daily.     Alpha-Lipoic Acid 600 MG CAPS Take 1 capsule (600 mg total) by mouth daily. 30 capsule 3   metFORMIN (GLUCOPHAGE) 500 MG tablet TAKE ONE TABLET BY MOUTH EVERY MORNING and TAKE TWO TABLETS BY MOUTH EVERY EVENING 270 tablet 0   No facility-administered medications prior to visit.     Per HPI unless specifically indicated in ROS section below Review of Systems  Objective:  BP 126/70    Pulse 66    Temp (!) 97.5 F (36.4 C) (Temporal)    Ht 5' 10"  (1.778 m)    Wt 235 lb 6 oz (106.8 kg)    SpO2 95%    BMI 33.77 kg/m   Wt Readings from Last 3 Encounters:  02/15/22 235 lb 6 oz (106.8 kg)  07/29/21 231 lb 8 oz (105 kg)  02/23/21 235 lb 4 oz (106.7 kg)      Physical Exam Vitals and nursing note reviewed.  Constitutional:      Appearance: Normal appearance. He is not ill-appearing.  Eyes:     Extraocular Movements: Extraocular movements intact.     Conjunctiva/sclera: Conjunctivae normal.     Pupils: Pupils are equal, round, and reactive to light.  Cardiovascular:     Rate and Rhythm: Normal rate and regular rhythm.     Pulses: Normal pulses.     Heart sounds: Normal heart sounds. No murmur heard. Pulmonary:     Effort: Pulmonary effort is normal. No respiratory distress.     Breath sounds: Normal breath sounds. No wheezing, rhonchi or rales.  Musculoskeletal:     Right lower leg: No edema.     Left lower leg: No edema.     Comments: See HPI for foot exam if done  Skin:    General: Skin is warm and dry.     Findings: No rash.  Neurological:     Mental Status: He is alert.  Psychiatric:        Mood and Affect: Mood normal.        Behavior: Behavior normal.      Results for orders  placed or performed in visit on 02/15/22  POCT glycosylated hemoglobin (Hb A1C)  Result Value Ref Range   Hemoglobin A1C 7.2 (A) 4.0 - 5.6 %   HbA1c POC (<> result, manual entry)     HbA1c, POC (prediabetic range)     HbA1c,  POC (controlled diabetic range)     Lab Results  Component Value Date   VITAMINB12 511 07/21/2021    Lab Results  Component Value Date   CREATININE 0.91 07/21/2021   BUN 15 07/21/2021   NA 140 07/21/2021   K 5.1 07/21/2021   CL 104 07/21/2021   CO2 30 07/21/2021   Assessment & Plan:  This visit occurred during the SARS-CoV-2 public health emergency.  Safety protocols were in place, including screening questions prior to the visit, additional usage of staff PPE, and extensive cleaning of exam room while observing appropriate contact time as indicated for disinfecting solutions.   Problem List Items Addressed This Visit     Type 2 diabetes mellitus with other specified complication ([place]) - Primary    Chronic, mildly deteriorated.  He forgets AM metformin dose - will change metformin to XR 782m and take 2 tablets at night time, update with effect. He takes novolog 70/30 PRN when fasting sugars >160 - discussed this is not appropriate use of insulin. Discussed options - recommend trial of weekly GLP1RA reviewing mechanism of action and possible side effects to watch for - will send trulicity 01.63AGx 1 month then 1.522mweekly afterwards, update [facility] difficulty filling or any concerns. No fmhx thyroid cancer.  RTC 4 mo f/u visit.  Declines DMSE.       Relevant Medications   metFORMIN (GLUCOPHAGE-XR) 750 MG 24 hr tablet   Dulaglutide (TRULICITY) 0.5.36GIW/8.0HOOPN   Dulaglutide (TRULICITY) 1.5 MGZY/2.4MGOPN   Vitamin B12 deficiency    No improvement in neuropathy symptoms despite B12 replacement.       Type 2 diabetes mellitus with diabetic neuropathy, unspecified [place]   Ongoing difficulty with symptoms of diabetic neuropathy.  Did not respond to  gabapentin or alpha lipoic acid.  Discussed trial TCA - he declines for now. Latest B12 normal (07/2021) Consider further B vitamin testing at f/u visit.       Relevant Medications   metFORMIN (GLUCOPHAGE-XR) 750 MG 24 hr tablet   Dulaglutide (TRULICITY) 0.5.00GBB/0.4UGOPN   Dulaglutide (TRULICITY) 1.5 MGQB/1.6XIOPN   Other Relevant Orders   POCT glycosylated hemoglobin (Hb A1C) (Completed)     Meds ordered this encounter  Medications   metFORMIN (GLUCOPHAGE-XR) 750 MG 24 hr tablet    Sig: Take 2 tablets (1,500 mg total) by mouth daily with breakfast.    Dispense:  180 tablet    Refill:  1    To replace plain metformin when he runs out of current prescription   Dulaglutide (TRULICITY) 0.5.03GUU/8.2CMOPN    Sig: Inject 0.75 mg into the skin once a week. For the first month    Dispense:  2 mL    Refill:  0   Dulaglutide (TRULICITY) 1.5 MGKL/4.9ZPOPN    Sig: Inject 1.5 mg into the skin once a week. After the first month of 0.7557mose    Dispense:  2 mL    Refill:  11   Orders Placed This Encounter  Procedures   POCT glycosylated hemoglobin (Hb A1C)     Patient Instructions  Once you run out of current metformin, start metformin XR 750m68mtablets at bedtime.  I have sent trulicity weekly injection to your pharmacy to price out. Take 0.75mg72mkly for the first month and if doing well, may increase to 1.5mg w65mly. This will be in place of novolog 70/30.  Return as needed or after 07/29/2022 for physical.  Let  us know sooner if any trouble getting medicines or tolerating changes.   Follow up plan: Return in about 6 months (around 08/18/2022), or if symptoms worsen or fail to improve, for annual exam, prior fasting for blood work.  Ria Bush, MD

## 2022-02-15 NOTE — Assessment & Plan Note (Signed)
No improvement in neuropathy symptoms despite B12 replacement.  ?

## 2022-02-16 ENCOUNTER — Telehealth: Payer: Self-pay

## 2022-02-16 NOTE — Progress Notes (Signed)
? ? ?  Chronic Care Management ?Pharmacy Assistant  ? ?Name: Devin Huffman  MRN: [id number] DOB: 1946/09/12 ? ?Reason for Encounter: CCM (Medication Questions) ?  ?I called patient to clarify the medication changes that were made yesterday with Dr. Danise Mina. Per the chart instructions I advised the patient: once he ran out of current metformin, start metformin XR '750mg'$  2 tablets at bedtime.  ?Take Trulicity 0.'75mg'$  weekly for the first month and if doing well, may increase to 1.'5mg'$  weekly.  ?Trulicity is to replace novolog 70/30.  ? ?Charlene Brooke, CPP notified ? ?Marijean Niemann, RMA ?Clinical Pharmacy Assistant ?[phone] ? ? ?

## 2022-03-13 DIAGNOSIS — L821 Other seborrheic keratosis: Secondary | ICD-10-CM | POA: Diagnosis not present

## 2022-03-13 DIAGNOSIS — L820 Inflamed seborrheic keratosis: Secondary | ICD-10-CM | POA: Diagnosis not present

## 2022-03-14 ENCOUNTER — Telehealth: Payer: Self-pay

## 2022-03-14 NOTE — Progress Notes (Signed)
? ? ?Chronic Care Management ?Pharmacy Assistant  ? ?Name: Devin Huffman  MRN: [id number] DOB: [birth year] ? ?Reason for Encounter: CCM (Diabetes Disease State) ?  ?Recent office visits:  ?02/15/22 Ria Bush, MD Diabetes: H2D: 7.2 Start: Trulicity 9.24 mg x 1 month then 1.5 mg weekly after. Change: Metformin: 1,500 mg daily with breakfast vs. 500 mg 3 tabs daily. Stop: Alpha-Lipoic Acid 6 mg daily.  ? ?Recent consult visits:  ?None since last CCM contact ? ?Hospital visits:  ?None in previous 6 months ? ?Medications: ?Outpatient Encounter Medications as of 03/14/2022  ?Medication Sig  ? amitriptyline (ELAVIL) 25 MG tablet Take 1 tablet (25 mg total) by mouth at bedtime.  ? amLODipine (NORVASC) 10 MG tablet TAKE ONE TABLET BY MOUTH EVERYDAY AT BEDTIME  ? Ascorbic Acid (VITAMIN C) 1000 MG tablet Take 1,000 mg by mouth every evening.   ? aspirin EC 81 MG tablet Take 81 mg by mouth every evening.   ? B Complex-Folic Acid (B COMPLEX-VITAMIN B12 PO) Take 1 tablet by mouth every evening.   ? blood glucose meter kit and supplies KIT Dispense based on patient and insurance preference. Use to check sugars up to twice daily as directed (E11.69).  ? Cholecalciferol (VITAMIN D3) 50 MCG (2000 UT) capsule Take 50 mcg by mouth every evening.   ? Coenzyme Q10 50 MG CAPS Take 1 capsule (50 mg total) by mouth daily.  ? Dulaglutide (TRULICITY) 2.68 TM/1.9QQ SOPN Inject 0.75 mg into the skin once a week. For the first month  ? Dulaglutide (TRULICITY) 1.5 IW/9.7LG SOPN Inject 1.5 mg into the skin once a week. After the first month of 0.34m dose  ? glucose blood (GLUCOSE METER TEST) test strip Use as instructed to check sugars twice daily as needed E11.69  ? indomethacin (INDOCIN) 50 MG capsule Take 1 capsule (50 mg total) by mouth 3 (three) times daily as needed (gout flare).  ? insulin aspart protamine - aspart (NOVOLOG MIX 70/30 FLEXPEN) (70-30) 100 UNIT/ML FlexPen Inject 0.1 mLs (10 Units total) into the skin daily with  breakfast. (Patient taking differently: Inject 10 Units into the skin daily with breakfast. Sliding scale)  ? Insulin Pen Needle (RELION PEN NEEDLES) 31G X 6 MM MISC Use as directed to check sugars twice daily  ? metFORMIN (GLUCOPHAGE-XR) 750 MG 24 hr tablet Take 2 tablets (1,500 mg total) by mouth daily with breakfast.  ? metoprolol succinate (TOPROL-XL) 25 MG 24 hr tablet TAKE ONE TABLET BY MOUTH EVERYDAY AT BEDTIME  ? molnupiravir EUA (LAGEVRIO) 200 MG CAPS capsule Take 4 capsules (800 mg total) by mouth in the morning and at bedtime.  ? Omega-3 Fatty Acids (FISH OIL) 1000 MG CAPS Take 1 capsule (1,000 mg total) by mouth 2 (two) times a day.  ? omeprazole (PRILOSEC) 20 MG capsule TAKE ONE CAPSULE BY MOUTH ONCE DAILY AS NEEDED  ? rosuvastatin (CRESTOR) 5 MG tablet TAKE ONE TABLET BY MOUTH ONCE DAILY  ? vitamin B-12 (CYANOCOBALAMIN) 1000 MCG tablet Take 1,000 mcg by mouth daily.  ? [DISCONTINUED] lisinopril (PRINIVIL,ZESTRIL) 5 MG tablet Take 1 tablet (5 mg total) by mouth daily.  ? ?No facility-administered encounter medications on file as of 03/14/2022.  ? ?Recent Relevant Labs: ?Lab Results  ?Component Value Date/Time  ? HGBA1C 7.2 (A) 02/15/2022 10:34 AM  ? HGBA1C 6.9 (H) 07/21/2021 07:43 AM  ? HGBA1C 6.8 (A) 01/12/2021 09:43 AM  ? HGBA1C 7.2 (H) 07/05/2020 07:59 AM  ? MICROALBUR 1.0 07/12/2020 11:41 AM  ?  MICROALBUR 2.3 (H) 07/04/2019 09:07 AM  ?  ?Kidney Function ?Lab Results  ?Component Value Date/Time  ? CREATININE 0.91 07/21/2021 07:43 AM  ? CREATININE 0.97 07/05/2020 07:59 AM  ? GFR 81.73 07/21/2021 07:43 AM  ? GFRNONAA >60 01/27/2020 03:55 AM  ? GFRAA >60 01/27/2020 03:55 AM  ? ?Contacted patient on 03/14/2022 to discuss diabetes disease state.  ? ?Current antihyperglycemic regimen:  ?Trulicity 1.61 mg ?Metformin 500 mg 1 tablet by mouth every morning- 2 tablets by mouth every evening ?Patient will start his new dose of 1.5 mg of Trulicity on Sunday 09/60/4540. Patient is still taking his Metformin the  "old' way as he still has some remaining. When he takes all of those he will star the 750 mg 2 tablets at breakfast.  ?Patient verbally confirms he is taking the above medications as directed. Yes ?Metformin 750 mg 2 tablets daily with breakfast ?Trulicity 1.5 mg ?   ?What recent interventions/DTPs have been made to improve glycemic control:  ?Metformin: Once he ran out of current medication - start Metfromin XR 750 mg 2 tablets at breakfast.   ?Trulicity 9.81 mg weekly for first month; 1.5 mg weekly after ? ?Have there been any recent hospitalizations or ED visits since last visit with CPP? No ? ?Patient denies hypoglycemic symptoms, including Pale, Sweaty, Shaky, Hungry, Nervous/irritable, and Vision changes ? ?Patient denies hyperglycemic symptoms, including blurry vision, excessive thirst, fatigue, polyuria, and weakness ? ?How often are you checking your blood sugar? once daily ? Patient was out of town at [facility] and did not have any reading available. ? ?During the week, how often does your blood glucose drop below 70? Never ? ?Are you checking your feet daily/regularly? Yes ? ?Adherence Review: ?Is the patient currently on a STATIN medication? Yes ?Is the patient currently on ACE/ARB medication? No ?Does the patient have >5 day gap between last estimated fill dates? No ? ?Care Gaps: ?Annual wellness visit in last year? Yes 07/29/2021 ?Most Recent BP reading: 126/70 on 02/15/2022 ? ?Most recent A1C reading: 7.2 on 02/15/2022 ?Last eye exam / retinopathy screening: Up to date ?Last diabetic foot exam: Up to date ? ?[facility] Medications: ?Medication Name/mg Last Fill Days Supply ?Metformin 750 mg   01/30/2022 90 ?Rosuvastatin 5 mg  01/30/2022 90 ?Trulicity 1.5 mg  19/14/7829 28  ? ?Upcoming appointments: ?No appointments scheduled within the next 30 days. ? ?Charlene Brooke, CPP notified ? ?Marijean Niemann, RMA ?Clinical Pharmacy Assistant ?[phone] ? ?

## 2022-04-05 ENCOUNTER — Telehealth: Payer: Self-pay

## 2022-04-05 NOTE — Progress Notes (Signed)
? ? ?  Chronic Care Management ?Pharmacy Assistant  ? ?Name: Devin Huffman  MRN: [id number] DOB: Sep 07, 1946 ? ?Reason for Encounter: CCM (Return patient call) ?  ?Missed a call from patient; returned his call. Patient called in regards to [facility] brining a delivery and was wondering what they were bringing. Patient has since been delivered his Trulicity and no longer needs anything. Patient is doing well. ? ?Charlene Brooke, CPP notified ? ?Marijean Niemann, RMA ?Clinical Pharmacy Assistant ?[phone] ? ? ?

## 2022-04-24 ENCOUNTER — Other Ambulatory Visit: Payer: Self-pay | Admitting: Family Medicine

## 2022-04-25 ENCOUNTER — Telehealth: Payer: Self-pay

## 2022-04-25 NOTE — Progress Notes (Signed)
Chronic Care Management Pharmacy Assistant   Name: Devin Huffman  MRN: [id number] DOB: February 17, 1946  Reason for Encounter: CCM (Medication Adherence and Delivery Coordination)   Recent office visits:  None since last CCM contact  Recent consult visits:  None since last CCM contact  Hospital visits:  None since last CCM contact  Medications: Outpatient Encounter Medications as of 04/25/2022  Medication Sig   amitriptyline (ELAVIL) 25 MG tablet Take 1 tablet (25 mg total) by mouth at bedtime.   amLODipine (NORVASC) 10 MG tablet TAKE ONE TABLET BY MOUTH EVERYDAY AT BEDTIME   Ascorbic Acid (VITAMIN C) 1000 MG tablet Take 1,000 mg by mouth every evening.    aspirin EC 81 MG tablet Take 81 mg by mouth every evening.    B Complex-Folic Acid (B COMPLEX-VITAMIN B12 PO) Take 1 tablet by mouth every evening.    blood glucose meter kit and supplies KIT Dispense based on patient and insurance preference. Use to check sugars up to twice daily as directed (E11.69).   Cholecalciferol (VITAMIN D3) 50 MCG (2000 UT) capsule Take 50 mcg by mouth every evening.    Coenzyme Q10 50 MG CAPS Take 1 capsule (50 mg total) by mouth daily.   Dulaglutide (TRULICITY) 0.09 QZ/3.0QT SOPN Inject 0.75 mg into the skin once a week. For the first month   Dulaglutide (TRULICITY) 1.5 MA/2.6JF SOPN Inject 1.5 mg into the skin once a week. After the first month of 0.5m dose   glucose blood (GLUCOSE METER TEST) test strip Use as instructed to check sugars twice daily as needed E11.69   indomethacin (INDOCIN) 50 MG capsule Take 1 capsule (50 mg total) by mouth 3 (three) times daily as needed (gout flare).   insulin aspart protamine - aspart (NOVOLOG MIX 70/30 FLEXPEN) (70-30) 100 UNIT/ML FlexPen Inject 0.1 mLs (10 Units total) into the skin daily with breakfast. (Patient taking differently: Inject 10 Units into the skin daily with breakfast. Sliding scale)   Insulin Pen Needle (RELION PEN NEEDLES) 31G X 6 MM MISC Use as  directed to check sugars twice daily   metFORMIN (GLUCOPHAGE-XR) 750 MG 24 hr tablet Take 2 tablets (1,500 mg total) by mouth daily with breakfast.   metoprolol succinate (TOPROL-XL) 25 MG 24 hr tablet TAKE ONE TABLET BY MOUTH EVERYDAY AT BEDTIME   molnupiravir EUA (LAGEVRIO) 200 MG CAPS capsule Take 4 capsules (800 mg total) by mouth in the morning and at bedtime.   Omega-3 Fatty Acids (FISH OIL) 1000 MG CAPS Take 1 capsule (1,000 mg total) by mouth 2 (two) times a day.   omeprazole (PRILOSEC) 20 MG capsule TAKE ONE CAPSULE BY MOUTH ONCE DAILY AS NEEDED   rosuvastatin (CRESTOR) 5 MG tablet TAKE ONE TABLET BY MOUTH ONCE DAILY   vitamin B-12 (CYANOCOBALAMIN) 1000 MCG tablet Take 1,000 mcg by mouth daily.   [DISCONTINUED] lisinopril (PRINIVIL,ZESTRIL) 5 MG tablet Take 1 tablet (5 mg total) by mouth daily.   No facility-administered encounter medications on file as of 04/25/2022.   BP Readings from Last 3 Encounters:  02/15/22 126/70  07/29/21 140/74  02/23/21 140/70    Lab Results  Component Value Date   HGBA1C 7.2 (A) 02/15/2022     No OVs, Consults, or hospital visits since last care coordination call / Pharmacist visit. No medication changes indicated  Last adherence delivery date: 02/06/2022      Patient is due for next adherence delivery on:  05/05/2022  Multiple attempts made to reach patient. Unsuccessful outreach.  Will refill based off of last adherence fill.   This delivery to include: Vials  90 Days    VIAL medications: Fish Oil 1000 mg -  1 a day morning CoQ 10 100 mg-  1  tablet daily  Vitamin C 1000 mg- 1 Tablet every evening Vitamin B12 1000 mcg - Take 1 tablet by mouth every morning  Amlodipine 10 mg - 1 tablet at bedtime Rosuvastatin 5 mg - 1 tablet daily Metoprolol 25 mg- 1 tablet daily Metformin 500 mg- 1 tablet every morning, 2 tablets every evening    Patient declined the following medications last month: Vitamin D3 50 Mcg- 1 capsule every evening - 1.5  bottles remaining   Is patient in packaging No  Refills requested from providers include: - already sent 04/25/2022 Rosuvastatin 5 mg - 1 tablet daily Metoprolol 25 mg- 1 tablet daily  Recent blood pressure readings are as follows:  Patient states he does not take blood pressure readings at home.   Recent blood glucose readings are as follows: Unable to speak with patient.   Annual wellness visit in last year? Yes 07/14/2021 Most Recent BP reading: 140/74 on 07/29/2021   If Diabetic: Most recent A1C reading: 6.9 on 07/21/2021 Last eye exam / retinopathy screening: 08/03/2020 Last diabetic foot exam: 01/12/2021  Charlene Brooke, CPP notified  Marijean Niemann, [place] Assistant [phone]

## 2022-05-12 ENCOUNTER — Telehealth: Payer: Self-pay

## 2022-05-12 NOTE — Progress Notes (Signed)
Chronic Care Management Pharmacy Assistant   Name: KEYLEN ECKENRODE  MRN: [id number] DOB: Apr 21, 1946  Reason for Encounter: CCM (Appointment Reminder)   Recent office visits:  02/15/22 Ria Bush, MD DM: A1C 7.2 Start: Trulcity 0.75 first month; 1.5 after Change: Metformin 1,500 mg vs 500 mg. Stop Alpha-Lipoic Acid 600 mg. FU 6 months  Recent consult visits:  03/13/22 Allyn Kenner (Derm): Seborrheic Keratosis 02/02/22 Monna Fam (Ophthalmology): Nuclear Cataract, bilateral 11/23/21 Viviana Simpler, MD Video Visit: Covid positive Start: molnupiravir EUA (LAGEVRIO) 200 MG CAPS capsule  Hospital visits:  None in previous 6 months  Medications: Outpatient Encounter Medications as of 05/12/2022  Medication Sig   amitriptyline (ELAVIL) 25 MG tablet Take 1 tablet (25 mg total) by mouth at bedtime.   amLODipine (NORVASC) 10 MG tablet TAKE ONE TABLET BY MOUTH EVERYDAY AT BEDTIME   Ascorbic Acid (VITAMIN C) 1000 MG tablet Take 1,000 mg by mouth every evening.    aspirin EC 81 MG tablet Take 81 mg by mouth every evening.    B Complex-Folic Acid (B COMPLEX-VITAMIN B12 PO) Take 1 tablet by mouth every evening.    blood glucose meter kit and supplies KIT Dispense based on patient and insurance preference. Use to check sugars up to twice daily as directed (E11.69).   Cholecalciferol (VITAMIN D3) 50 MCG (2000 UT) capsule Take 50 mcg by mouth every evening.    Coenzyme Q10 50 MG CAPS Take 1 capsule (50 mg total) by mouth daily.   Dulaglutide (TRULICITY) 5.62 ZH/0.8MV SOPN Inject 0.75 mg into the skin once a week. For the first month   Dulaglutide (TRULICITY) 1.5 HQ/4.6NG SOPN Inject 1.5 mg into the skin once a week. After the first month of 0.15m dose   glucose blood (GLUCOSE METER TEST) test strip Use as instructed to check sugars twice daily as needed E11.69   indomethacin (INDOCIN) 50 MG capsule Take 1 capsule (50 mg total) by mouth 3 (three) times daily as needed (gout flare).   insulin  aspart protamine - aspart (NOVOLOG MIX 70/30 FLEXPEN) (70-30) 100 UNIT/ML FlexPen Inject 0.1 mLs (10 Units total) into the skin daily with breakfast. (Patient taking differently: Inject 10 Units into the skin daily with breakfast. Sliding scale)   Insulin Pen Needle (RELION PEN NEEDLES) 31G X 6 MM MISC Use as directed to check sugars twice daily   metFORMIN (GLUCOPHAGE-XR) 750 MG 24 hr tablet Take 2 tablets (1,500 mg total) by mouth daily with breakfast.   metoprolol succinate (TOPROL-XL) 25 MG 24 hr tablet TAKE ONE TABLET BY MOUTH EVERYDAY AT BEDTIME   molnupiravir EUA (LAGEVRIO) 200 MG CAPS capsule Take 4 capsules (800 mg total) by mouth in the morning and at bedtime.   Omega-3 Fatty Acids (FISH OIL) 1000 MG CAPS Take 1 capsule (1,000 mg total) by mouth 2 (two) times a day.   omeprazole (PRILOSEC) 20 MG capsule TAKE ONE CAPSULE BY MOUTH ONCE DAILY AS NEEDED   rosuvastatin (CRESTOR) 5 MG tablet TAKE ONE TABLET BY MOUTH ONCE DAILY   vitamin B-12 (CYANOCOBALAMIN) 1000 MCG tablet Take 1,000 mcg by mouth daily.   [DISCONTINUED] lisinopril (PRINIVIL,ZESTRIL) 5 MG tablet Take 1 tablet (5 mg total) by mouth daily.   No facility-administered encounter medications on file as of 05/12/2022.   GLisabeth Pickwas contacted to remind of upcoming telephone visit with LCharlene Brookeon 05/17/2022 at 8:45. Patient was reminded to have any blood glucose and blood pressure readings available for review at appointment.  Message was left reminding patient of appointment.  CCM referral has been placed prior to visit?  No   Star Rating Drugs: Medication:   Last Fill: Day Supply Metformin 750 mg  05/01/2022 90  Rosuvastatin 5 mg  50/38/8828 90 Trulicity 1.5 mg                       05/01/2022 [place], CPP notified  Marijean Niemann, [place] Clinical Pharmacy Assistant [phone]

## 2022-05-17 ENCOUNTER — Ambulatory Visit: Payer: Medicare HMO | Admitting: Pharmacist

## 2022-05-17 ENCOUNTER — Telehealth: Payer: Self-pay | Admitting: Pharmacist

## 2022-05-17 DIAGNOSIS — I10 Essential (primary) hypertension: Secondary | ICD-10-CM

## 2022-05-17 DIAGNOSIS — E1169 Type 2 diabetes mellitus with other specified complication: Secondary | ICD-10-CM

## 2022-05-17 DIAGNOSIS — E1140 Type 2 diabetes mellitus with diabetic neuropathy, unspecified: Secondary | ICD-10-CM

## 2022-05-17 DIAGNOSIS — M1A0710 Idiopathic chronic gout, right ankle and foot, without tophus (tophi): Secondary | ICD-10-CM

## 2022-05-17 MED ORDER — OZEMPIC (0.25 OR 0.5 MG/DOSE) 2 MG/1.5ML ~~LOC~~ SOPN: 0.5000 mg | PEN_INJECTOR | SUBCUTANEOUS | 6 refills | Status: DC

## 2022-05-17 NOTE — Telephone Encounter (Signed)
Patient has been tolerating Trulicity 1.5 mg weekly well, however he will enter donut hole and will not be able to afford it after this month. Trulicity PAP is not accepting new applicants at this time, but Ozempic PAP is available. Based on patient-reported income he should qualify for PAP.  Recommend to switch Trulicity to Ozempic 0.5 mg weekly and pursue PAP [employer]).  Also, patient is interested in a nutrition referral. Routing to PCP.

## 2022-05-17 NOTE — Telephone Encounter (Addendum)
New nutritionist referral placed to [facility] for diabetes education classes ok to switch to ozempic, which was sent to [facility]

## 2022-05-17 NOTE — Addendum Note (Signed)
Addended by: Ria Bush on: 05/17/2022 02:05 PM   Modules accepted: Orders

## 2022-05-17 NOTE — Progress Notes (Signed)
Chronic Care Management Pharmacy Note  05/17/2022 Name:  Devin Huffman MRN:  [id number] DOB:  1946-11-22  Summary: CCM F/U visit -Reviewed medications, pt is tolerating Trulicity 1.5 mg and stopped Novolog Mix. He is not taking amitriptyline, indomethacin, omeprazole - updated med list. -Pt will not be able to afford Trulicity after this month (entering donut hole); reviewed PAP options, Trulicity PAP unavailable but Ozempic PAP is open. -Pt is interested in nutrition referral  Recommendations/Changes made from today's visit: -Change Trulicity to Ozempic 0.5 mg weekly and pursue PAP ([facility]ares) -Coordinate with PCP for nutrition referral  Plan: -[place] will call patient 1 month for DM update -Pharmacist follow up televisit scheduled for 2 months -PCP AWV 08/18/22   Subjective: Devin Huffman is an 76 y.o. year old male who is a primary patient of Ria Bush, MD.  The CCM team was consulted for assistance with disease management and care coordination needs.    Engaged with patient by telephone for follow up visit in response to provider referral for pharmacy case management and/or care coordination services.   Consent to Services:  The patient was given information about Chronic Care Management services, agreed to services, and gave verbal consent prior to initiation of services.  Please see initial visit note for detailed documentation.   Patient Care Team: Ria Bush, MD as PCP - General (Family Medicine) Charlton Haws, [facility] as Pharmacist (Pharmacist)  Recent office visits: 02/15/22 Ria Bush, MD DM: A1C 7.2 Start: Trulcity 0.75 first month; 1.5 after Change: Metformin 1,500 mg vs 500 mg. Stop Alpha-Lipoic Acid 600 mg. FU 6 months  Recent consult visits: 03/13/22 Allyn Kenner (Derm): Seborrheic Keratosis 02/02/22 Monna Fam (Ophthalmology): Nuclear Cataract, bilateral 11/23/21 Viviana Simpler, MD Video Visit: Covid positive Start:  molnupiravir EUA (LAGEVRIO) 200 MG CAPS capsule  Hospital visits: None in previous 6 months   Objective:  Lab Results  Component Value Date   CREATININE 0.91 07/21/2021   BUN 15 07/21/2021   GFR 81.73 07/21/2021   GFRNONAA >60 01/27/2020   GFRAA >60 01/27/2020   NA 140 07/21/2021   K 5.1 07/21/2021   CALCIUM 9.1 07/21/2021   CO2 30 07/21/2021   GLUCOSE 129 (H) 07/21/2021    Lab Results  Component Value Date/Time   HGBA1C 7.2 (A) 02/15/2022 10:34 AM   HGBA1C 6.9 (H) 07/21/2021 07:43 AM   HGBA1C 6.8 (A) 01/12/2021 09:43 AM   HGBA1C 7.2 (H) 07/05/2020 07:59 AM   FRUCTOSAMINE 219 02/24/2020 08:10 AM   GFR 81.73 07/21/2021 07:43 AM   GFR 75.31 07/05/2020 07:59 AM   MICROALBUR 1.0 07/12/2020 11:41 AM   MICROALBUR 2.3 (H) 07/04/2019 09:07 AM    Last diabetic Eye exam:  Lab Results  Component Value Date/Time   HMDIABEYEEXA No Retinopathy 02/02/2022 12:00 AM    Last diabetic Foot exam: No results found for: HMDIABFOOTEX   Lab Results  Component Value Date   CHOL 112 07/21/2021   HDL 40.90 07/21/2021   LDLCALC 47 07/05/2020   LDLDIRECT 45.0 07/21/2021   TRIG 236.0 (H) 07/21/2021   CHOLHDL 3 07/21/2021       Latest Ref Rng & Units 07/21/2021    7:43 AM 07/05/2020    7:59 AM 02/24/2020    8:10 AM  Hepatic Function  Total Protein 6.0 - 8.3 g/dL 6.5   6.6   6.5    Albumin 3.5 - 5.2 g/dL 4.2   4.2   3.7    AST 0 - 37  U/L 16   19   16     ALT 0 - 53 U/L 22   25   17     Alk Phosphatase 39 - 117 U/L 64   73   67    Total Bilirubin 0.2 - 1.2 mg/dL 0.7   0.6   0.8      Lab Results  Component Value Date/Time   TSH 2.16 07/10/2019 10:18 AM   TSH 0.91 08/31/2016 03:39 PM       Latest Ref Rng & Units 02/24/2020    8:10 AM 01/27/2020    3:55 AM 01/26/2020    1:21 AM  CBC  WBC 4.0 - 10.5 K/uL 9.3   16.6   17.0    Hemoglobin 13.0 - 17.0 g/dL 14.4   16.2   16.4    Hematocrit 39.0 - 52.0 % 42.8   47.2   47.8    Platelets 150.0 - 400.0 K/uL 344.0   458   444      No  results found for: VD25OH  Clinical ASCVD: No  The ASCVD Risk score (Arnett DK, et al., 2019) failed to calculate for the following reasons:   The valid total cholesterol range is 130 to 320 mg/dL       07/14/2021    2:53 PM 07/12/2020    9:05 AM 07/10/2019    9:33 AM  Depression screen PHQ 2/9  Decreased Interest 0 0 0  Down, Depressed, Hopeless 0 0 0  PHQ - 2 Score 0 0 0  Altered sleeping 0 0   Tired, decreased energy 0 0   Change in appetite 0 0   Feeling bad or failure about yourself  0 0   Trouble concentrating 0 0   Moving slowly or fidgety/restless 0 0   Suicidal thoughts 0 0   PHQ-9 Score 0 0   Difficult doing work/chores Not difficult at all Not difficult at all      Social History   Tobacco Use  Smoking Status Never  Smokeless Tobacco Never   BP Readings from Last 3 Encounters:  02/15/22 126/70  07/29/21 140/74  02/23/21 140/70   Pulse Readings from Last 3 Encounters:  02/15/22 66  07/29/21 70  02/23/21 70   Wt Readings from Last 3 Encounters:  02/15/22 235 lb 6 oz (106.8 kg)  07/29/21 231 lb 8 oz (105 kg)  02/23/21 235 lb 4 oz (106.7 kg)   BMI Readings from Last 3 Encounters:  02/15/22 33.77 kg/m  07/29/21 33.22 kg/m  02/23/21 33.75 kg/m    Assessment/Interventions: Review of patient past medical history, allergies, medications, health status, including review of consultants reports, laboratory and other test data, was performed as part of comprehensive evaluation and provision of chronic care management services.   SDOH:  (Social Determinants of Health) assessments and interventions performed: No - done Aug 2022 AWV  SDOH Screenings   Alcohol Screen: Low Risk    Last Alcohol Screening Score (AUDIT): 0  Depression (PHQ2-9): Low Risk    PHQ-2 Score: 0  Financial Resource Strain: Low Risk    Difficulty of Paying Living Expenses: Not hard at all  Food Insecurity: No Food Insecurity   Worried About Charity fundraiser in the Last Year: Never true    Ran Out of Food in the Last Year: Never true  Housing: Low Risk    Last Housing Risk Score: 0  Physical Activity: Inactive   Days of Exercise per Week: 0 days  Minutes of Exercise per Session: 0 min  Social Connections: Not on file  Stress: No Stress Concern Present   Feeling of Stress : Not at all  Tobacco Use: Low Risk    Smoking Tobacco Use: Never   Smokeless Tobacco Use: Never   Passive Exposure: Not on file  Transportation Needs: No Transportation Needs   Lack of Transportation (Medical): No   Lack of Transportation (Non-Medical): No    CCM Care Plan  Allergies  Allergen Reactions   Lisinopril Hives and Swelling    ?angioedema - lip swelling, hives    Medications Reviewed Today     Reviewed by Charlton Haws, [facility] (Pharmacist) on 05/17/22 at [phone]  Med List Status: <None>   Medication Order Taking? Sig Documenting Provider Last Dose Status Informant  amLODipine (NORVASC) 10 MG tablet [id number] Yes TAKE ONE TABLET BY MOUTH EVERYDAY AT BEDTIME Ria Bush, MD Taking Active   Ascorbic Acid (VITAMIN C) 1000 MG tablet [id number] Yes Take 1,000 mg by mouth every evening.  [provider] Taking Active Spouse/Significant Other  aspirin EC 81 MG tablet [id number] Yes Take 81 mg by mouth every evening.  Ria Bush, MD Taking Active Spouse/Significant Other  B Complex-Folic Acid (B COMPLEX-VITAMIN B12 PO) [id number] Yes Take 1 tablet by mouth every evening.  [provider] Taking Active Spouse/Significant Other  blood glucose meter kit and supplies KIT [id number] Yes Dispense based on patient and insurance preference. Use to check sugars up to twice daily as directed (E11.69). Ria Bush, MD Taking Active   Cholecalciferol (VITAMIN D3) 50 MCG (2000 UT) capsule [id number] Yes Take 50 mcg by mouth every evening.  [provider] Taking Active Spouse/Significant Other  Coenzyme Q10 50 MG CAPS [id number] Yes Take 1 capsule (50 mg total) by  mouth daily. Ria Bush, MD Taking Active Spouse/Significant Other  Dulaglutide (TRULICITY) 1.5 GY/6.9SW SOPN [id number] Yes Inject 1.5 mg into the skin once a week. After the first month of 0.77m dose GRia Bush MD Taking Active   glucose blood (GLUCOSE METER TEST) test strip [id number] Use as instructed to check sugars twice daily as needed E11.69 GRia Bush MD Taking Active   Insulin Pen Needle (RELION PEN NEEDLES) 31G X 6 MM MISC [id number] Use as directed to check sugars twice daily GRia Bush MD Taking Active     Discontinued 02/22/12 1140 (Error)   metFORMIN (GLUCOPHAGE-XR) 750 MG 24 hr tablet [id number] Take 2 tablets (1,500 mg total) by mouth daily with breakfast. GRia Bush MD Taking Active   metoprolol succinate (TOPROL-XL) 25 MG 24 hr tablet [id number] TAKE ONE TABLET BY MOUTH EVERYDAY AT BEDTIME GRia Bush MD Taking Active   Omega-3 Fatty Acids (FISH OIL) 1000 MG CAPS [id number] Take 1 capsule (1,000 mg total) by mouth 2 (two) times a day. GRia Bush MD Taking Active Spouse/Significant Other  rosuvastatin (CRESTOR) 5 MG tablet [id number] TAKE ONE TABLET BY MOUTH ONCE DAILY GRia Bush MD Taking Active   vitamin B-12 (CYANOCOBALAMIN) 1000 MCG tablet [id number] Take 1,000 mcg by mouth daily. [provider] Taking Active Spouse/Significant Other            Patient Active Problem List   Diagnosis Date Noted   COVID-19 virus infection 11/23/2021   Diabetic cataract of both eyes ([place] 07/12/2020   Posterior vitreous detachment 09/10/2019   Right wrist pain 10/13/2018   Health maintenance examination 05/28/2017   Skin rash 05/28/2017   History of right hip replacement  03/26/2017   OA (osteoarthritis) of hip 03/07/2017   Thoracic aortic ectasia ([place]) 02/22/2017   Type 2 diabetes mellitus with diabetic neuropathy, unspecified ([place]) 08/31/2016   Obesity, Class I, BMI 30-34.9 05/14/2015    Advanced care planning/counseling discussion 11/11/2014   Vitamin B12 deficiency 10/15/2012   Medicare annual wellness visit, subsequent 10/02/2011   GERD (gastroesophageal reflux disease) 10/02/2011   Dyslipidemia associated with type 2 diabetes mellitus ([place]) 07/15/2009   Gout 07/15/2009   Type 2 diabetes mellitus with other specified complication ([place]) 23/36/1224   Essential hypertension 06/23/2008    Immunization History  Administered Date(s) Administered   Fluad Quad(high Dose 65+) 01/12/2021   Influenza Split 10/15/2012   Influenza,inj,Quad PF,6+ Mos 11/11/2014, 11/17/2015, 08/31/2016, 11/28/2017, 10/21/2018   PFIZER(Purple Top)SARS-COV-2 Vaccination 04/19/2020, 05/11/2020   Pneumococcal Conjugate-13 11/11/2014   Pneumococcal Polysaccharide-23 10/15/2012   Td 12/12/1996, 03/22/2009, 07/12/2020   Zoster, Live 10/13/2013    Conditions to be addressed/monitored:  Hypertension, Hyperlipidemia, and Diabetes  Care Plan : [place] plan  Updates made by Charlton Haws, [place] since 05/17/2022 12:00 AM     Problem: Hypertension, Hyperlipidemia, and Diabetes   Priority: High     Long-Range Goal: Disease mgmt   Start Date: 05/17/2022  Expected End Date: 05/18/2023  This Visit's Progress: On track  Priority: High  Note:   Current Barriers:  Unable to independently afford treatment regimen Unable to independently monitor therapeutic efficacy  Pharmacist Clinical Goal(s):  Patient will verbalize ability to afford treatment regimen achieve adherence to monitoring guidelines and medication adherence to achieve therapeutic efficacy through collaboration with PharmD and provider.   Interventions: 1:1 collaboration with Ria Bush, MD regarding development and update of comprehensive plan of care as evidenced by provider attestation and co-signature Inter-disciplinary care team collaboration (see longitudinal plan of care) Comprehensive medication review performed;  medication list updated in electronic medical record  Hypertension (BP goal <130/80) -Controlled -  BP at goal in clinic -Current treatment: Amlodipine 10 mg daily - Appropriate, Effective, Safe, Accessible Metoprolol succinate 25 mg daily - Appropriate, Effective, Safe, Accessible -Medications previously tried: lisinopril  -Educated on BP goals and benefits of medications for prevention of heart attack, stroke and kidney damage; -Counseled to monitor BP at home periodically -Recommended to continue current medication  Hyperlipidemia: (LDL goal < 70) -Controlled - LDL 45 (07/2021) at goal; pt affirms compliance as below -Current treatment: Rosuvastatin 5 mg daily -Appropriate, Effective, Safe, Accessible Coenezyme Q10 -Appropriate, Effective, Safe, Accessible OTC Omega 3 fish oil -Appropriate, Effective, Safe, Accessible Aspirin 81 mg daily -Appropriate, Effective, Safe, Accessible -Medications previously tried: n/a  -Educated on Cholesterol goals;  -Recommended to continue current medication  Diabetes (A1c goal <7%) -Not ideally controlled - A1c 4.9% (06/5299), Trulicity was added at that time; pt reports he stopped taking Novolog Mix when he started Trulicity; he reports he will be in donut hole and Trulicity will no longer be affordable after this month; he is still taking metformin 500 mg 3 tab daily as he has not run out yet, he plans to start 750 mg dose once he runs out -Pt reports he "eats what he wants" and is interested in seeing a nutritionist -Current home glucose readings - checking 1-2x weekly fasting glucose: 167; rarely < 150 -Current medications: Trulicity 1.5 mg weekly Sun - Appropriate, Query Effective Metformin XR 500 mg - 1 AM, 2 PM -Appropriate, Query Effective Novolog mix 70/30 - 10 units AM - not taking -Medications previously tried: n/a  -Educated on  A1c and blood sugar goals;Benefits of routine self-monitoring of blood sugar; -Assessed finances - he will  qualify for PAP, however Trulicity is not accepting new applicants at this time so would need to switch GLP-1 RA. Discussed Ozempic, pt is amenable to change. -Recommend to switch Trulicity to Ozempic 0.5 mg weekly. Pursue Novo Cares PAP -Coordinate with PCP for Nutrition referral  Patient Goals/Self-Care Activities Patient will:  - take medications as prescribed as evidenced by patient report and record review focus on medication adherence by routine check glucose daily, document, and provide at future appointments collaborate with provider on medication access solutions      Medication Assistance:  [place] PAP  Compliance/Adherence/Medication fill history: Care Gaps: Urine microalbumin (due 07/12/21)  Star-Rating Drugs: Metformin - PDC 99%  Medication Access: Within the past 30 days, how often has patient missed a dose of medication? 0 Is a pillbox or other method used to improve adherence? No  Factors that may affect medication adherence? nonadherence to medications (forgets) Are meds synced by current pharmacy? Yes  Are meds delivered by current pharmacy? Yes  Does patient experience delays in picking up medications due to transportation concerns? No   [facility] Reviewed: Is patient disadvantaged to use [facility]?: No  Current Rx insurance plan: [employer] [place] Name and location of Current pharmacy:  [facility] - [place], [place] - [address] [place] [address] Phone: [phone] Fax: [phone]  90-day Vials (last 05/05/22) Fish Oil 1000 mg -  1 a day morning CoQ 10 100 mg-  1  tablet daily  Vitamin C 1000 mg- 1 Tablet every evening Vitamin B12 1000 mcg - Take 1 tablet by mouth every morning  Amlodipine 10 mg - 1 tablet at bedtime Rosuvastatin 5 mg - 1 tablet daily Metoprolol 25 mg- 1 tablet daily Metformin 750 mg- 2 tablets daily   Care Plan and Follow Up Patient Decision:   Patient agrees to Care Plan and Follow-up.  Plan: Telephone follow up appointment with care management team member scheduled for:  2 months  Charlene Brooke, PharmD, [facility] Clinical Pharmacist [place] [facility] at [facility] [phone]

## 2022-05-17 NOTE — Patient Instructions (Signed)
Visit Information  Phone number for Pharmacist: [phone]   Goals Addressed   None     Care Plan : [place] plan  Updates made by Charlton Haws, RPH since 05/17/2022 12:00 AM     Problem: Hypertension, Hyperlipidemia, and Diabetes   Priority: High     Long-Range Goal: Disease mgmt   Start Date: 05/17/2022  Expected End Date: 05/18/2023  This Visit's Progress: On track  Priority: High  Note:   Current Barriers:  Unable to independently afford treatment regimen Unable to independently monitor therapeutic efficacy  Pharmacist Clinical Goal(s):  Patient will verbalize ability to afford treatment regimen achieve adherence to monitoring guidelines and medication adherence to achieve therapeutic efficacy through collaboration with PharmD and provider.   Interventions: 1:1 collaboration with Ria Bush, MD regarding development and update of comprehensive plan of care as evidenced by provider attestation and co-signature Inter-disciplinary care team collaboration (see longitudinal plan of care) Comprehensive medication review performed; medication list updated in electronic medical record  Hypertension (BP goal <130/80) -Controlled -  BP at goal in clinic -Current treatment: Amlodipine 10 mg daily - Appropriate, Effective, Safe, Accessible Metoprolol succinate 25 mg daily - Appropriate, Effective, Safe, Accessible -Medications previously tried: lisinopril  -Educated on BP goals and benefits of medications for prevention of heart attack, stroke and kidney damage; -Counseled to monitor BP at home periodically -Recommended to continue current medication  Hyperlipidemia: (LDL goal < 70) -Controlled - LDL 45 (07/2021) at goal; pt affirms compliance as below -Current treatment: Rosuvastatin 5 mg daily -Appropriate, Effective, Safe, Accessible Coenezyme Q10 -Appropriate, Effective, Safe, Accessible OTC Omega 3 fish oil -Appropriate, Effective, Safe,  Accessible Aspirin 81 mg daily -Appropriate, Effective, Safe, Accessible -Medications previously tried: n/a  -Educated on Cholesterol goals;  -Recommended to continue current medication  Diabetes (A1c goal <7%) -Not ideally controlled - A1c 4.2% (04/9562), Trulicity was added at that time; pt reports he stopped taking Novolog Mix when he started Trulicity; he reports he will be in donut hole and Trulicity will no longer be affordable after this month; he is still taking metformin 500 mg 3 tab daily as he has not run out yet, he plans to start 750 mg dose once he runs out -Pt reports he "eats what he wants" and is interested in seeing a nutritionist -Current home glucose readings - checking 1-2x weekly fasting glucose: 167; rarely < 150 -Current medications: Trulicity 1.5 mg weekly Sun - Appropriate, Query Effective Metformin XR 500 mg - 1 AM, 2 PM -Appropriate, Query Effective Novolog mix 70/30 - 10 units AM - not taking -Medications previously tried: n/a  -Educated on A1c and blood sugar goals;Benefits of routine self-monitoring of blood sugar; -Assessed finances - he will qualify for PAP, however Trulicity is not accepting new applicants at this time so would need to switch GLP-1 RA. Discussed Ozempic, pt is amenable to change. -Recommend to switch Trulicity to Ozempic 0.5 mg weekly. Pursue Novo Cares PAP -Coordinate with PCP for Nutrition referral  Patient Goals/Self-Care Activities Patient will:  - take medications as prescribed as evidenced by patient report and record review focus on medication adherence by routine check glucose daily, document, and provide at future appointments collaborate with provider on medication access solutions      The patient verbalized understanding of instructions, educational materials, and care plan provided today and DECLINED offer to receive copy of patient instructions, educational materials, and care plan.  Telephone follow up appointment with  pharmacy team member scheduled for: 2  months  Charlene Brooke, PharmD, BCACP Clinical Pharmacist [place] [facility] at [facility] [phone]

## 2022-05-29 ENCOUNTER — Telehealth: Payer: Self-pay

## 2022-05-29 NOTE — Progress Notes (Signed)
Patient has been approved for patient assistance through [employer] for [employer]. Approval is 05/29/2022 - 12/10/2022. Medication will be delivered to the office in 10 - 14 business days. Patient is set up for auto-refill.  Patient was called and informed.  Charlene Brooke, CPP notified  Marijean Niemann, [place] Clinical Pharmacy Assistant [phone]

## 2022-06-01 ENCOUNTER — Ambulatory Visit: Payer: Medicare HMO | Admitting: *Deleted

## 2022-06-02 ENCOUNTER — Encounter: Payer: Self-pay | Admitting: *Deleted

## 2022-06-02 ENCOUNTER — Encounter: Payer: Medicare HMO | Attending: Family Medicine | Admitting: *Deleted

## 2022-06-02 VITALS — BP 114/74 | Ht 72.0 in | Wt 234.0 lb

## 2022-06-02 DIAGNOSIS — E785 Hyperlipidemia, unspecified: Secondary | ICD-10-CM | POA: Diagnosis not present

## 2022-06-02 DIAGNOSIS — I10 Essential (primary) hypertension: Secondary | ICD-10-CM | POA: Diagnosis not present

## 2022-06-02 DIAGNOSIS — Z713 Dietary counseling and surveillance: Secondary | ICD-10-CM | POA: Insufficient documentation

## 2022-06-02 DIAGNOSIS — E1169 Type 2 diabetes mellitus with other specified complication: Secondary | ICD-10-CM | POA: Insufficient documentation

## 2022-06-02 DIAGNOSIS — M1A0710 Idiopathic chronic gout, right ankle and foot, without tophus (tophi): Secondary | ICD-10-CM | POA: Diagnosis not present

## 2022-06-02 DIAGNOSIS — Z794 Long term (current) use of insulin: Secondary | ICD-10-CM | POA: Diagnosis not present

## 2022-06-02 DIAGNOSIS — E1140 Type 2 diabetes mellitus with diabetic neuropathy, unspecified: Secondary | ICD-10-CM | POA: Diagnosis not present

## 2022-06-02 NOTE — Progress Notes (Signed)
Diabetes Self-Management Education  Visit Type: First/Initial  Appt. Start Time: 0905 Appt. End Time: 1010  06/02/2022  [name], identified by name and date of birth, is a 76 y.o. male with a diagnosis of Diabetes: Type 2.   ASSESSMENT  Blood pressure 114/74, height 6' (1.829 m), weight 234 lb (106.1 kg). Body mass index is 31.74 kg/m.   Diabetes Self-Management Education - 06/02/22 1207       Visit Information   Visit Type First/Initial      Initial Visit   Diabetes Type Type 2    Date Diagnosed 2020    Are you currently following a meal plan? No    Are you taking your medications as prescribed? Yes      Health Coping   How would you rate your overall health? Good      Psychosocial Assessment   Patient Belief/Attitude about Diabetes Other (comment)   "frustrated"   What is the hardest part about your diabetes right now, causing you the most concern, or is the most worrisome to you about your diabetes?   Making healty food and beverage choices    Self-care barriers None    Self-management support Doctor's office;Family    Other persons present Spouse/SO    Patient Concerns Nutrition/Meal planning;Glycemic Control;Medication;Monitoring;Weight Control    Special Needs None    Preferred Learning Style Auditory;Visual;Hands on    Learning Readiness Contemplating    How often do you need to have someone help you when you read instructions, pamphlets, or other written materials from your doctor or pharmacy? 1 - Never    What is the last grade level you completed in school? 12      Pre-Education Assessment   Patient understands the diabetes disease and treatment process. Needs Instruction    Patient understands incorporating nutritional management into lifestyle. Needs Instruction    Patient undertands incorporating physical activity into lifestyle. Needs Instruction    Patient understands using medications safely. Needs Instruction    Patient understands monitoring  blood glucose, interpreting and using results Needs Review    Patient understands prevention, detection, and treatment of acute complications. Needs Instruction    Patient understands prevention, detection, and treatment of chronic complications. Needs Instruction    Patient understands how to develop strategies to address psychosocial issues. Needs Instruction    Patient understands how to develop strategies to promote health/change behavior. Needs Instruction      Complications   Last HgB A1C per patient/outside source 7.2 %   02/15/2022   How often do you check your blood sugar? 3-4 times / week   Pt reports maybe checking weekly   Fasting Blood glucose range (mg/dL) 253-664   He reports fasting blood sugar last week was 148 mg/dL.   Have you had a dilated eye exam in the past 12 months? Yes    Have you had a dental exam in the past 12 months? Yes    Are you checking your feet? No      Dietary Intake   Breakfast eats out for many meals - egg and cheese biscuit,eggs, tenderloin with grits and toast, cereal and milk    Snack (morning) 0-1 snack - nuts    Lunch meat and vegetables; burger and fries; cabbage    Dinner chicken, beef, pork, fish; peas, beans, corn, occasional rice and pasta; cabbage, beets, greens, broccoli, green beans; grilled chicken salads    Beverage(s) water, sugar sweetened tea, Crystal light  Activity / Exercise   Activity / Exercise Type ADL's      Patient Education   Previous Diabetes Education No    Disease Pathophysiology Definition of diabetes, type 1 and 2, and the diagnosis of diabetes;Factors that contribute to the development of diabetes;Explored patient's options for treatment of their diabetes    Healthy Eating Role of diet in the treatment of diabetes and the relationship between the three main macronutrients and blood glucose level;Food label reading, portion sizes and measuring food.;Reviewed blood glucose goals for pre and post meals and how to  evaluate the patients' food intake on their blood glucose level.;Information on hints to eating out and maintain blood glucose control.    Being Active Role of exercise on diabetes management, blood pressure control and cardiac health.    Medications Reviewed patients medication for diabetes, action, purpose, timing of dose and side effects.    Monitoring Purpose and frequency of SMBG.;Taught/discussed recording of test results and interpretation of SMBG.;Identified appropriate SMBG and/or A1C goals.;Daily foot exams    Chronic complications Relationship between chronic complications and blood glucose control    Diabetes Stress and Support Identified and addressed patients feelings and concerns about diabetes      Individualized Goals (developed by patient)   Reducing Risk Other (comment)   improve blood sugars, decrease medications, prevent diabetes complications, lose weight     Outcomes   Expected Outcomes Demonstrated interest in learning but significant barriers to change    Future DMSE 4-6 wks         Individualized Plan for Diabetes Self-Management Training:   Learning Objective:  Patient will have a greater understanding of diabetes self-management. Patient education plan is to attend individual and/or group sessions per assessed needs and concerns.   Plan:   Patient Instructions  Check blood sugars 1 x day before breakfast or 2 hrs after supper every day Bring blood sugar records to the next appointment  Exercise: Walk as tolerated  Eat 3 meals day,   1  snack a day Space meals 4-6 hours apart Avoid sugar sweetened drinks (tea) Limit fried foods Increase water to 4-5 servings/day  Complete 3 Day Food Record and bring to next appt  Return for appointment on:   Tuesday July 04, 2022 at 10:30 am  Expected Outcomes:  Demonstrated interest in learning but significant barriers to change  Education material provided:  General Meal Planning Guidelines Simple Meal Plan 3  Day Food Record  If problems or questions, patient to contact team via:   Sharion Settler, RN, CCM, CDCES [phone]  Future DSME appointment: 4-6 wks July 04, 2022 with this educator

## 2022-06-09 NOTE — Telephone Encounter (Signed)
Spoke to pt. He will keep an eye on his blood sugar and will let us know if he has any issues until he can get the Ozempic.

## 2022-06-09 NOTE — Telephone Encounter (Signed)
Trulicity is longer acting medication. I think ok to wait until they return but would want to get as soon as able when they return.

## 2022-06-09 NOTE — Telephone Encounter (Signed)
Patient wife called to follow up because patient is taking Trulicity and took his last dose last Sunday and the Ozempic says it will take 10-14 business days which should be next week but they will be out of town and wouldn't be able to get until the following week. He usually takes on Sundays and wont have anything for this weekend or next. She wanted to if this will be okay or what they should do? Please advise, call back [phone]

## 2022-06-16 ENCOUNTER — Telehealth: Payer: Self-pay

## 2022-06-16 NOTE — Telephone Encounter (Signed)
Received pt's Ozempic 0.25, 0.5 mg dose 2 mg/1.5 mL shipment (5 bxs total) today.    Made pt aware by phn.  Says he will be coming back from [facility] but will try to pick up today.  [Placed Ozempic (5 bxs total) in 3rd refrigerator, 3rd shelf.]

## 2022-07-04 ENCOUNTER — Encounter: Payer: Self-pay | Admitting: *Deleted

## 2022-07-04 ENCOUNTER — Encounter: Payer: Medicare HMO | Attending: Family Medicine | Admitting: *Deleted

## 2022-07-04 VITALS — BP 122/70 | Wt 232.4 lb

## 2022-07-04 DIAGNOSIS — E1140 Type 2 diabetes mellitus with diabetic neuropathy, unspecified: Secondary | ICD-10-CM | POA: Diagnosis not present

## 2022-07-04 NOTE — Patient Instructions (Signed)
Check blood sugars before breakfast or 2 hrs after supper 3-4 x week  Exercise:  Walk as tolerated  Eat 3 meals day,   1-2  snacks a day Space meals 4-6 hours apart Limit fried foods (2 x week) Add more non-starchy vegetables to your meals Avoid sugar sweetened drinks (tea, lemonade) Continue to drink 4-5 servings of water per day

## 2022-07-04 NOTE — Progress Notes (Signed)
Diabetes Self-Management Education  Visit Type: Follow-up  Appt. Start Time: 1020 Appt. End Time: 1115  07/04/2022  [name], identified by name and date of birth, is a 76 y.o. male with a diagnosis of Diabetes: Type 2.   ASSESSMENT  Blood pressure 122/70, weight 232 lb 6.4 oz (105.4 kg). Body mass index is 31.52 kg/m.   Diabetes Self-Management Education - 07/04/22 1243       Visit Information   Visit Type Follow-up      Initial Visit   Diabetes Type Type 2      Complications   How often do you check your blood sugar? 3-4 times / week    Fasting Blood glucose range (mg/dL) 130-179;180-200   FBG's 151-180 mg/dL   Postprandial Blood glucose range (mg/dL) 70-129;130-179;180-200   pp's 125-188 mg/dL   Number of hypoglycemic episodes per month 0    Have you had a dilated eye exam in the past 12 months? Yes    Have you had a dental exam in the past 12 months? Yes    Are you checking your feet? No      Dietary Intake   Breakfast eats 3 meals and 0-1 snacks/day - see 3 Day Food Record      Activity / Exercise   Activity / Exercise Type ADL's      Patient Education   Disease Pathophysiology Definition of diabetes, type 1 and 2, and the diagnosis of diabetes;Factors that contribute to the development of diabetes;Explored patient's options for treatment of their diabetes    Healthy Eating Role of diet in the treatment of diabetes and the relationship between the three main macronutrients and blood glucose level;Food label reading, portion sizes and measuring food.;Reviewed blood glucose goals for pre and post meals and how to evaluate the patients' food intake on their blood glucose level.;Information on hints to eating out and maintain blood glucose control.    Being Active Role of exercise on diabetes management, blood pressure control and cardiac health.    Medications Reviewed patients medication for diabetes, action, purpose, timing of dose and side effects.     Monitoring Purpose and frequency of SMBG.;Taught/discussed recording of test results and interpretation of SMBG.;Identified appropriate SMBG and/or A1C goals.;Daily foot exams    Chronic complications Relationship between chronic complications and blood glucose control    Diabetes Stress and Support Identified and addressed patients feelings and concerns about diabetes      Individualized Goals (developed by patient)   Nutrition Follow meal plan discussed    Physical Activity Exercise 3-5 times per week;15 minutes per day    Medications take my medication as prescribed    Monitoring  Test my blood glucose as discussed      Post-Education Assessment   Patient understands the diabetes disease and treatment process. Demonstrates understanding / competency    Patient understands incorporating nutritional management into lifestyle. Needs Review    Patient undertands incorporating physical activity into lifestyle. Needs Review    Patient understands using medications safely. Comphrehends key points    Patient understands monitoring blood glucose, interpreting and using results Demonstrates understanding / competency    Patient understands prevention, detection, and treatment of acute complications. N/A    Patient understands prevention, detection, and treatment of chronic complications. Demonstrates understanding / competency    Patient understands how to develop strategies to address psychosocial issues. Comprehends key points    Patient understands how to develop strategies to promote health/change behavior. Comprehends key points  Outcomes   Expected Outcomes Demonstrated interest in learning but significant barriers to change    Future DMSE PRN    Program Status Completed      Subsequent Visit   Since your last visit have you continued or begun to take your medications as prescribed? Yes   switched to Ozempic from Trulicity but unsure of correct dose. He has been taking 0.25 mg for 2  weeks.  EPIC med list has 0.5 mg. He will check prescription when he gets home.   Since your last visit have you had your blood pressure checked? No    Since your last visit have you experienced any weight changes? Loss    Weight Loss (lbs) 1.6    Since your last visit, are you checking your blood glucose at least once a day? N/A   3 x week        Individualized Plan for Diabetes Self-Management Training:   Learning Objective:  Patient will have a greater understanding of diabetes self-management. Patient education plan is to attend individual and/or group sessions per assessed needs and concerns.   Plan:   Patient Instructions  Check blood sugars before breakfast or 2 hrs after supper 3-4 x week  Exercise:  Walk as tolerated  Eat 3 meals day,   1-2  snacks a day Space meals 4-6 hours apart Limit fried foods (2 x week) Add more non-starchy vegetables to your meals Avoid sugar sweetened drinks (tea, lemonade) Continue to drink 4-5 servings of water per day  Expected Outcomes:  Demonstrated interest in learning but significant barriers to change  Education material provided:  Planning a Balanced Meal Quick and Balanced Meals  If problems or questions, patient to contact team via:   Johny Drilling, RN, [place], [place] [phone]  Future DSME appointment: PRN

## 2022-07-13 ENCOUNTER — Telehealth: Payer: Self-pay

## 2022-07-13 NOTE — Progress Notes (Signed)
    Chronic Care Management Pharmacy Assistant   Name: Devin Huffman  MRN: [id number] DOB: 10/13/1946  Reason for Encounter: CCM (Appointment Reminder)  Medications: Outpatient Encounter Medications as of 07/13/2022  Medication Sig Note   amLODipine (NORVASC) 10 MG tablet TAKE ONE TABLET BY MOUTH EVERYDAY AT BEDTIME    Ascorbic Acid (VITAMIN C) 1000 MG tablet Take 1,000 mg by mouth every evening.     aspirin EC 81 MG tablet Take 81 mg by mouth every evening.     blood glucose meter kit and supplies KIT Dispense based on patient and insurance preference. Use to check sugars up to twice daily as directed (E11.69).    Cholecalciferol (VITAMIN D3) 50 MCG (2000 UT) capsule Take 50 mcg by mouth every evening.     Coenzyme Q10 50 MG CAPS Take 1 capsule (50 mg total) by mouth daily.    glucose blood (GLUCOSE METER TEST) test strip Use as instructed to check sugars twice daily as needed E11.69    metFORMIN (GLUCOPHAGE-XR) 750 MG 24 hr tablet Take 2 tablets (1,500 mg total) by mouth daily with breakfast.    metoprolol succinate (TOPROL-XL) 25 MG 24 hr tablet TAKE ONE TABLET BY MOUTH EVERYDAY AT BEDTIME    Omega-3 Fatty Acids (FISH OIL) 1000 MG CAPS Take 1 capsule (1,000 mg total) by mouth 2 (two) times a day.    rosuvastatin (CRESTOR) 5 MG tablet TAKE ONE TABLET BY MOUTH ONCE DAILY    Semaglutide,0.25 or 0.5MG /DOS, (OZEMPIC, 0.25 OR 0.5 MG/DOSE,) 2 MG/1.5ML SOPN Inject 0.5 mg into the skin once a week. 06/02/2022: Will start Ozempic once available and stop Trulicity   vitamin W-10 (CYANOCOBALAMIN) 1000 MCG tablet Take 1,000 mcg by mouth daily.    [DISCONTINUED] lisinopril (PRINIVIL,ZESTRIL) 5 MG tablet Take 1 tablet (5 mg total) by mouth daily.    No facility-administered encounter medications on file as of 07/13/2022.   Devin Huffman was contacted to remind of upcoming telephone visit with Charlene Brooke on 07/18/22 at 8:45. Patient was reminded to have any blood glucose and blood pressure  readings available for review at appointment.   Message was left reminding patient of appointment.  CCM referral has been placed prior to visit?  No   Star Rating Drugs: Medication:  Last Fill: Day Supply Metformin 750 mg 05/01/22 90  Rosuvastatin 5 mg 05/01/22 90  Ozempic 0.5 mg PAP  Charlene Brooke, CPP notified  Marijean Niemann, [place] Clinical Pharmacy Assistant [phone]

## 2022-07-18 ENCOUNTER — Telehealth: Payer: Medicare HMO

## 2022-07-20 ENCOUNTER — Ambulatory Visit: Payer: Medicare HMO | Admitting: Pharmacist

## 2022-07-20 DIAGNOSIS — E1140 Type 2 diabetes mellitus with diabetic neuropathy, unspecified: Secondary | ICD-10-CM

## 2022-07-20 DIAGNOSIS — I10 Essential (primary) hypertension: Secondary | ICD-10-CM

## 2022-07-20 DIAGNOSIS — E1169 Type 2 diabetes mellitus with other specified complication: Secondary | ICD-10-CM

## 2022-07-20 NOTE — Patient Instructions (Signed)
Visit Information  Phone number for Pharmacist: [phone]   Goals Addressed   None     Care Plan : [place] plan  Updates made by Charlton Haws, RPH since 07/20/2022 12:00 AM     Problem: Hypertension, Hyperlipidemia, and Diabetes   Priority: High     Long-Range Goal: Disease mgmt   Start Date: 05/17/2022  Expected End Date: 05/18/2023  This Visit's Progress: On track  Recent Progress: On track  Priority: High  Note:   Current Barriers:  None identified  Pharmacist Clinical Goal(s):  Patient will contact provider office for questions/concerns as evidenced notation of same in electronic health record through collaboration with PharmD and provider.   Interventions: 1:1 collaboration with Ria Bush, MD regarding development and update of comprehensive plan of care as evidenced by provider attestation and co-signature Inter-disciplinary care team collaboration (see longitudinal plan of care) Comprehensive medication review performed; medication list updated in electronic medical record  Hypertension (BP goal <130/80) -Controlled -  BP at goal in clinic -Current treatment: Amlodipine 10 mg daily - Appropriate, Effective, Safe, Accessible Metoprolol succinate 25 mg daily - Appropriate, Effective, Safe, Accessible -Medications previously tried: lisinopril  -Educated on BP goals and benefits of medications for prevention of heart attack, stroke and kidney damage; -Counseled to monitor BP at home periodically -Recommended to continue current medication  Hyperlipidemia: (LDL goal < 70) -Controlled - LDL 45 (07/2021) at goal; pt affirms compliance as below -Current treatment: Rosuvastatin 5 mg daily -Appropriate, Effective, Safe, Accessible Coenezyme Q10 -Appropriate, Effective, Safe, Accessible OTC Omega 3 fish oil -Appropriate, Effective, Safe, Accessible Aspirin 81 mg daily -Appropriate, Effective, Safe, Accessible -Medications previously tried: n/a   -Educated on Cholesterol goals;  -Recommended to continue current medication  Diabetes (A1c goal <7%) -Not ideally controlled - A1c 7.2% (02/2022), pt recently switched from Trulicity to [place] due to PAP; he is tolerating Ozempic well -Pt saw a nutritionist, he thinks it may help some -Current home glucose readings - checking 1-2x weekly fasting glucose: 135, 147 -Current medications: Ozempic 0.5 mg weekly - Appropriate, Query Effective Metformin XR 500 mg - 1 AM, 2 PM -Appropriate, Query Effective Metformin XR 750 mg - 2 tab daily - not started yet -Medications previously tried: Trulicity (cost in donut hole), Novolog 70/30 -Educated on A1c and blood sugar goals;Benefits of routine self-monitoring of blood sugar; -Recommend to continue current medication  Patient Goals/Self-Care Activities Patient will:  - take medications as prescribed as evidenced by patient report and record review focus on medication adherence by routine check glucose daily, document, and provide at future appointments collaborate with provider on medication access solutions      Patient verbalizes understanding of instructions and care plan provided today and agrees to view in [place]. Active MyChart status and patient understanding of how to access instructions and care plan via MyChart confirmed with patient.    Telephone follow up appointment with pharmacy team member scheduled for: 6 months  Charlene Brooke, PharmD, [facility] Clinical Pharmacist [facility] [phone]

## 2022-07-20 NOTE — Progress Notes (Signed)
Chronic Care Management Pharmacy Note  07/20/2022 Name:  Devin Huffman MRN:  [id number] DOB:  1946/10/10  Summary: CCM F/U visit -Reviewed medications, pt is tolerating Ozempic 0.5 mg (PAP); fasting BG 130-150  Recommendations/Changes made from today's visit: -No med changes  Plan: -[place] will call patient 3 months to renew PAP -Pharmacist follow up televisit scheduled for 6 months -PCP AWV 08/18/22    Subjective: Devin Huffman is an 76 y.o. year old male who is a primary patient of Ria Bush, MD.  The CCM team was consulted for assistance with disease management and care coordination needs.    Engaged with patient by telephone for follow up visit in response to provider referral for pharmacy case management and/or care coordination services.   Consent to Services:  The patient was given information about Chronic Care Management services, agreed to services, and gave verbal consent prior to initiation of services.  Please see initial visit note for detailed documentation.   Patient Care Team: Ria Bush, MD as PCP - General (Family Medicine) Charlton Haws, [facility] as Pharmacist (Pharmacist)  Recent office visits: 02/15/22 Ria Bush, MD DM: A1C 7.2 Start: Trulcity 0.75 first month; 1.5 after Change: Metformin 1,500 mg vs 500 mg. Stop Alpha-Lipoic Acid 600 mg. FU 6 months  Recent consult visits: 03/13/22 Allyn Kenner (Derm): Seborrheic Keratosis 02/02/22 Monna Fam (Ophthalmology): Nuclear Cataract, bilateral 11/23/21 Viviana Simpler, MD Video Visit: Covid positive Start: molnupiravir EUA (LAGEVRIO) 200 MG CAPS capsule  Hospital visits: None in previous 6 months   Objective:  Lab Results  Component Value Date   CREATININE 0.91 07/21/2021   BUN 15 07/21/2021   GFR 81.73 07/21/2021   GFRNONAA >60 01/27/2020   GFRAA >60 01/27/2020   NA 140 07/21/2021   K 5.1 07/21/2021   CALCIUM 9.1 07/21/2021   CO2 30 07/21/2021   GLUCOSE  129 (H) 07/21/2021    Lab Results  Component Value Date/Time   HGBA1C 7.2 (A) 02/15/2022 10:34 AM   HGBA1C 6.9 (H) 07/21/2021 07:43 AM   HGBA1C 6.8 (A) 01/12/2021 09:43 AM   HGBA1C 7.2 (H) 07/05/2020 07:59 AM   FRUCTOSAMINE 219 02/24/2020 08:10 AM   GFR 81.73 07/21/2021 07:43 AM   GFR 75.31 07/05/2020 07:59 AM   MICROALBUR 1.0 07/12/2020 11:41 AM   MICROALBUR 2.3 (H) 07/04/2019 09:07 AM    Last diabetic Eye exam:  Lab Results  Component Value Date/Time   HMDIABEYEEXA No Retinopathy 02/02/2022 12:00 AM    Last diabetic Foot exam: No results found for: "HMDIABFOOTEX"   Lab Results  Component Value Date   CHOL 112 07/21/2021   HDL 40.90 07/21/2021   LDLCALC 47 07/05/2020   LDLDIRECT 45.0 07/21/2021   TRIG 236.0 (H) 07/21/2021   CHOLHDL 3 07/21/2021       Latest Ref Rng & Units 07/21/2021    7:43 AM 07/05/2020    7:59 AM 02/24/2020    8:10 AM  Hepatic Function  Total Protein 6.0 - 8.3 g/dL 6.5  6.6  6.5   Albumin 3.5 - 5.2 g/dL 4.2  4.2  3.7   AST 0 - 37 U/L 16  19  16    ALT 0 - 53 U/L 22  25  17    Alk Phosphatase 39 - 117 U/L 64  73  67   Total Bilirubin 0.2 - 1.2 mg/dL 0.7  0.6  0.8     Lab Results  Component Value Date/Time   TSH 2.16 07/10/2019 10:18 AM  TSH 0.91 08/31/2016 03:39 PM       Latest Ref Rng & Units 02/24/2020    8:10 AM 01/27/2020    3:55 AM 01/26/2020    1:21 AM  CBC  WBC 4.0 - 10.5 K/uL 9.3  16.6  17.0   Hemoglobin 13.0 - 17.0 g/dL 14.4  16.2  16.4   Hematocrit 39.0 - 52.0 % 42.8  47.2  47.8   Platelets 150.0 - 400.0 K/uL 344.0  458  444     No results found for: "VD25OH"  Clinical ASCVD: No  The ASCVD Risk score (Arnett DK, et al., 2019) failed to calculate for the following reasons:   The valid total cholesterol range is 130 to 320 mg/dL       06/02/2022    9:11 AM 07/14/2021    2:53 PM 07/12/2020    9:05 AM  Depression screen PHQ 2/9  Decreased Interest 0 0 0  Down, Depressed, Hopeless 0 0 0  PHQ - 2 Score 0 0 0  Altered sleeping   0 0  Tired, decreased energy  0 0  Change in appetite  0 0  Feeling bad or failure about yourself   0 0  Trouble concentrating  0 0  Moving slowly or fidgety/restless  0 0  Suicidal thoughts  0 0  PHQ-9 Score  0 0  Difficult doing work/chores  Not difficult at all Not difficult at all     Social History   Tobacco Use  Smoking Status Never  Smokeless Tobacco Never   BP Readings from Last 3 Encounters:  07/04/22 122/70  06/02/22 114/74  02/15/22 126/70   Pulse Readings from Last 3 Encounters:  02/15/22 66  07/29/21 70  02/23/21 70   Wt Readings from Last 3 Encounters:  07/04/22 232 lb 6.4 oz (105.4 kg)  06/02/22 234 lb (106.1 kg)  02/15/22 235 lb 6 oz (106.8 kg)   BMI Readings from Last 3 Encounters:  07/04/22 31.52 kg/m  06/02/22 31.74 kg/m  02/15/22 33.77 kg/m    Assessment/Interventions: Review of patient past medical history, allergies, medications, health status, including review of consultants reports, laboratory and other test data, was performed as part of comprehensive evaluation and provision of chronic care management services.   SDOH:  (Social Determinants of Health) assessments and interventions performed: No - done Aug 2022 AWV  SDOH Screenings   Alcohol Screen: Low Risk  (07/14/2021)   Alcohol Screen    Last Alcohol Screening Score (AUDIT): 0  Depression (PHQ2-9): Low Risk  (06/02/2022)   Depression (PHQ2-9)    PHQ-2 Score: 0  Financial Resource Strain: Low Risk  (07/14/2021)   Overall Financial Resource Strain (CARDIA)    Difficulty of Paying Living Expenses: Not hard at all  Food Insecurity: No Food Insecurity (07/14/2021)   Hunger Vital Sign    Worried About Running Out of Food in the Last Year: Never true    [place] in the Last Year: Never true  Housing: Low Risk  (07/14/2021)   Housing    Last Housing Risk Score: 0  Physical Activity: Inactive (07/14/2021)   Exercise Vital Sign    Days of Exercise per Week: 0 days    Minutes of Exercise  per Session: 0 min  Social Connections: Not on file  Stress: No Stress Concern Present (07/14/2021)   [place]    Feeling of Stress : Not at all  Tobacco Use: Low Risk  (  07/04/2022)   Patient History    Smoking Tobacco Use: Never    Smokeless Tobacco Use: Never    Passive Exposure: Not on file  Transportation Needs: No Transportation Needs (07/14/2021)   PRAPARE - Transportation    Lack of Transportation (Medical): No    Lack of Transportation (Non-Medical): No    CCM Care Plan  Allergies  Allergen Reactions   Lisinopril Hives and Swelling    ?angioedema - lip swelling, hives    Medications Reviewed Today     Reviewed by Charlton Haws, [facility] (Pharmacist) on 07/20/22 at 1431  Med List Status: <None>   Medication Order Taking? Sig Documenting Provider Last Dose Status Informant  amLODipine (NORVASC) 10 MG tablet [id number] Yes TAKE ONE TABLET BY MOUTH EVERYDAY AT BEDTIME Ria Bush, MD Taking Active   Ascorbic Acid (VITAMIN C) 1000 MG tablet [id number] Yes Take 1,000 mg by mouth every evening.  [provider] Taking Active Spouse/Significant Other  aspirin EC 81 MG tablet [id number] Yes Take 81 mg by mouth every evening.  Ria Bush, MD Taking Active Spouse/Significant Other  blood glucose meter kit and supplies KIT [id number] Yes Dispense based on patient and insurance preference. Use to check sugars up to twice daily as directed (E11.69). Ria Bush, MD Taking Active   Cholecalciferol (VITAMIN D3) 50 MCG (2000 UT) capsule [id number] Yes Take 50 mcg by mouth every evening.  [provider] Taking Active Spouse/Significant Other  Coenzyme Q10 50 MG CAPS [id number] Yes Take 1 capsule (50 mg total) by mouth daily. Ria Bush, MD Taking Active Spouse/Significant Other  glucose blood (GLUCOSE METER TEST) test strip [id number] Yes Use as instructed to check sugars twice daily as  needed E11.69 Ria Bush, MD Taking Active     Discontinued 02/22/12 1140 (Error)   metFORMIN (GLUCOPHAGE-XR) 750 MG 24 hr tablet [id number] Yes Take 2 tablets (1,500 mg total) by mouth daily with breakfast. Ria Bush, MD Taking Active   metoprolol succinate (TOPROL-XL) 25 MG 24 hr tablet [id number] Yes TAKE ONE TABLET BY MOUTH EVERYDAY AT BEDTIME Ria Bush, MD Taking Active   Omega-3 Fatty Acids (FISH OIL) 1000 MG CAPS [id number] Yes Take 1 capsule (1,000 mg total) by mouth 2 (two) times a day. Ria Bush, MD Taking Active Spouse/Significant Other  rosuvastatin (CRESTOR) 5 MG tablet [id number] Yes TAKE ONE TABLET BY MOUTH ONCE DAILY Ria Bush, MD Taking Active   Semaglutide,0.25 or 0.5MG/DOS, (OZEMPIC, 0.25 OR 0.5 MG/DOSE,) 2 MG/1.5ML SOPN [id number] Yes Inject 0.5 mg into the skin once a week. Ria Bush, MD Taking Active            Med Note Rolan Bucco Jul 20, 2022  2:31 PM) Novo Cares PAP  vitamin B-12 (CYANOCOBALAMIN) 1000 MCG tablet [id number] Yes Take 1,000 mcg by mouth daily. [provider] Taking Active Spouse/Significant Other            Patient Active Problem List   Diagnosis Date Noted   COVID-19 virus infection 11/23/2021   Diabetic cataract of both eyes ([place]) 07/12/2020   Posterior vitreous detachment 09/10/2019   Right wrist pain 10/13/2018   Health maintenance examination 05/28/2017   Skin rash 05/28/2017   History of right hip replacement 03/26/2017   OA (osteoarthritis) of hip 03/07/2017   Thoracic aortic ectasia ([place]) 02/22/2017   Type 2 diabetes mellitus with diabetic neuropathy, unspecified ([place]) 08/31/2016   Obesity, Class I, BMI 30-34.9 05/14/2015   Advanced care planning/counseling discussion 11/11/2014   Vitamin  B12 deficiency 10/15/2012   Medicare annual wellness visit, subsequent 10/02/2011   GERD (gastroesophageal reflux disease) 10/02/2011   Dyslipidemia associated with type 2 diabetes  mellitus ([place]) 07/15/2009   Gout 07/15/2009   Type 2 diabetes mellitus with other specified complication ([place]) 92/10/9416   Essential hypertension 06/23/2008    Immunization History  Administered Date(s) Administered   Fluad Quad(high Dose 65+) 01/12/2021   Influenza Split 10/15/2012   Influenza,inj,Quad PF,6+ Mos 11/11/2014, 11/17/2015, 08/31/2016, 11/28/2017, 10/21/2018   PFIZER(Purple Top)SARS-COV-2 Vaccination 04/19/2020, 05/11/2020   Pneumococcal Conjugate-13 11/11/2014   Pneumococcal Polysaccharide-23 10/15/2012   Td 12/12/1996, 03/22/2009, 07/12/2020   Zoster, Live 10/13/2013    Conditions to be addressed/monitored:  Hypertension, Hyperlipidemia, and Diabetes  Care Plan : [place] plan  Updates made by Charlton Haws, [place] since 07/20/2022 12:00 AM     Problem: Hypertension, Hyperlipidemia, and Diabetes   Priority: High     Long-Range Goal: Disease mgmt   Start Date: 05/17/2022  Expected End Date: 05/18/2023  This Visit's Progress: On track  Recent Progress: On track  Priority: High  Note:   Current Barriers:  None identified  Pharmacist Clinical Goal(s):  Patient will contact provider office for questions/concerns as evidenced notation of same in electronic health record through collaboration with PharmD and provider.   Interventions: 1:1 collaboration with Ria Bush, MD regarding development and update of comprehensive plan of care as evidenced by provider attestation and co-signature Inter-disciplinary care team collaboration (see longitudinal plan of care) Comprehensive medication review performed; medication list updated in electronic medical record  Hypertension (BP goal <130/80) -Controlled -  BP at goal in clinic -Current treatment: Amlodipine 10 mg daily - Appropriate, Effective, Safe, Accessible Metoprolol succinate 25 mg daily - Appropriate, Effective, Safe, Accessible -Medications previously tried: lisinopril  -Educated on BP  goals and benefits of medications for prevention of heart attack, stroke and kidney damage; -Counseled to monitor BP at home periodically -Recommended to continue current medication  Hyperlipidemia: (LDL goal < 70) -Controlled - LDL 45 (07/2021) at goal; pt affirms compliance as below -Current treatment: Rosuvastatin 5 mg daily -Appropriate, Effective, Safe, Accessible Coenezyme Q10 -Appropriate, Effective, Safe, Accessible OTC Omega 3 fish oil -Appropriate, Effective, Safe, Accessible Aspirin 81 mg daily -Appropriate, Effective, Safe, Accessible -Medications previously tried: n/a  -Educated on Cholesterol goals;  -Recommended to continue current medication  Diabetes (A1c goal <7%) -Not ideally controlled - A1c 7.2% (02/2022), pt recently switched from Trulicity to [place] due to PAP; he is tolerating Ozempic well -Pt saw a nutritionist, he thinks it may help some -Current home glucose readings - checking 1-2x weekly fasting glucose: 135, 147 -Current medications: Ozempic 0.5 mg weekly - Appropriate, Query Effective Metformin XR 500 mg - 1 AM, 2 PM -Appropriate, Query Effective Metformin XR 750 mg - 2 tab daily - not started yet -Medications previously tried: Trulicity (cost in donut hole), Novolog 70/30 -Educated on A1c and blood sugar goals;Benefits of routine self-monitoring of blood sugar; -Recommend to continue current medication  Patient Goals/Self-Care Activities Patient will:  - take medications as prescribed as evidenced by patient report and record review focus on medication adherence by routine check glucose daily, document, and provide at future appointments collaborate with provider on medication access solutions       Medication Assistance:  [place] PAP approved 2023  Compliance/Adherence/Medication fill history: Care Gaps: Urine microalbumin (due 07/12/21)  Star-Rating Drugs: Metformin - PDC 99% Rosuvastatin - PDC 99%  Medication  Access: Within the past  30 days, how often has patient missed a dose of medication? 0 Is a pillbox or other method used to improve adherence? No  Factors that may affect medication adherence? nonadherence to medications (forgets) Are meds synced by current pharmacy? Yes  Are meds delivered by current pharmacy? Yes  Does patient experience delays in picking up medications due to transportation concerns? No   [facility] Reviewed: Is patient disadvantaged to use [facility]?: No  Current Rx insurance plan: [employer] [place] Name and location of Current pharmacy:  [facility] - [place], [place] - [address] [place] [address] Phone: [phone] Fax: [phone]  90-day Vials (last 05/05/22) Fish Oil 1000 mg -  1 a day morning CoQ 10 100 mg-  1  tablet daily  Vitamin C 1000 mg- 1 Tablet every evening Vitamin B12 1000 mcg - Take 1 tablet by mouth every morning  Amlodipine 10 mg - 1 tablet at bedtime Rosuvastatin 5 mg - 1 tablet daily Metoprolol 25 mg- 1 tablet daily Metformin 750 mg- 2 tablets daily   Care Plan and Follow Up Patient Decision:  Patient agrees to Care Plan and Follow-up.  Plan: Telephone follow up appointment with care management team member scheduled for:  6 months  Charlene Brooke, PharmD, BCACP Clinical Pharmacist [facility] [phone]

## 2022-07-21 ENCOUNTER — Telehealth: Payer: Self-pay

## 2022-07-21 NOTE — Progress Notes (Unsigned)
Chronic Care Management Pharmacy Assistant   Name: FARMER MCCAHILL  MRN: [id number] DOB: [birth year]  Reason for Encounter: CCM (Medication Adherence and Delivery Coordination)  Recent office visits:  None since last CCM contact  Recent consult visits:  None since last CCM contact  Hospital visits:  None in previous 6 months  Medications: Outpatient Encounter Medications as of 07/21/2022  Medication Sig Note   amLODipine (NORVASC) 10 MG tablet TAKE ONE TABLET BY MOUTH EVERYDAY AT BEDTIME    Ascorbic Acid (VITAMIN C) 1000 MG tablet Take 1,000 mg by mouth every evening.     aspirin EC 81 MG tablet Take 81 mg by mouth every evening.     blood glucose meter kit and supplies KIT Dispense based on patient and insurance preference. Use to check sugars up to twice daily as directed (E11.69).    Cholecalciferol (VITAMIN D3) 50 MCG (2000 UT) capsule Take 50 mcg by mouth every evening.     Coenzyme Q10 50 MG CAPS Take 1 capsule (50 mg total) by mouth daily.    glucose blood (GLUCOSE METER TEST) test strip Use as instructed to check sugars twice daily as needed E11.69    metFORMIN (GLUCOPHAGE-XR) 750 MG 24 hr tablet Take 2 tablets (1,500 mg total) by mouth daily with breakfast.    metoprolol succinate (TOPROL-XL) 25 MG 24 hr tablet TAKE ONE TABLET BY MOUTH EVERYDAY AT BEDTIME    Omega-3 Fatty Acids (FISH OIL) 1000 MG CAPS Take 1 capsule (1,000 mg total) by mouth 2 (two) times a day.    rosuvastatin (CRESTOR) 5 MG tablet TAKE ONE TABLET BY MOUTH ONCE DAILY    Semaglutide,0.25 or 0.5MG/DOS, (OZEMPIC, 0.25 OR 0.5 MG/DOSE,) 2 MG/1.5ML SOPN Inject 0.5 mg into the skin once a week. 07/20/2022: Novo Cares PAP   vitamin B-12 (CYANOCOBALAMIN) 1000 MCG tablet Take 1,000 mcg by mouth daily.    [DISCONTINUED] lisinopril (PRINIVIL,ZESTRIL) 5 MG tablet Take 1 tablet (5 mg total) by mouth daily.    No facility-administered encounter medications on file as of 07/21/2022.    BP Readings from Last 3  Encounters:  07/04/22 122/70  06/02/22 114/74  02/15/22 126/70    Lab Results  Component Value Date   HGBA1C 7.2 (A) 02/15/2022    No OVs, Consults, or hospital visits since last care coordination call / Pharmacist visit. No medication changes indicated  Last adherence delivery date: 05/05/22      Patient is due for next adherence delivery on: 08/03/22  Spoke with patient on 07/24/2022 reviewed medications and coordinated delivery.  This delivery to include: Vials 90 Days    VIAL medications: Fish Oil 1000 mg -  1 a day morning CoQ 10 100 mg-  1  tablet daily  Vitamin C 1000 mg- 1 Tablet every evening Vitamin B12 1000 mcg - Take 1 tablet by mouth every morning  Amlodipine 10 mg - 1 tablet at bedtime Rosuvastatin 5 mg - 1 tablet daily Metoprolol 25 mg- 1 tablet daily Metformin 500 mg- 1 tablet every morning, 2 tablets every evening  Vitamin D3 50 Mcg- 1 capsule every evening   Patient declined the following medications last month: No medication denied this month  Patient receives through manufacturer: Ozempic   Is patient in packaging No  No refill request needed.  Confirmed delivery date of 08/03/22, advised patient that pharmacy will contact them the morning of delivery.  Recent blood glucose readings are as follows: Patient was out picking peas and did not has  his readings.   Annual wellness visit in last year? Yes 07/14/2021 Most Recent BP reading: 122/70 on 07/04/22   If Diabetic: Most recent A1C reading: 7.2 on 02/15/22 Last eye exam / retinopathy screening: Up to date Last diabetic foot exam: Up to date   Charlene Brooke, CPP notified   Marijean Niemann, [place] [phone]   Cycle dispensing form sent to Margaretmary Dys, PTM for review.

## 2022-07-27 ENCOUNTER — Telehealth: Payer: Self-pay | Admitting: Family Medicine

## 2022-07-27 NOTE — Telephone Encounter (Signed)
Patient called and stated that he didn't get a phone call for his appointment with the health nurse visit phone call.

## 2022-07-31 ENCOUNTER — Ambulatory Visit (INDEPENDENT_AMBULATORY_CARE_PROVIDER_SITE_OTHER): Payer: Medicare HMO

## 2022-07-31 VITALS — Wt 232.0 lb

## 2022-07-31 DIAGNOSIS — Z0000 Encounter for general adult medical examination without abnormal findings: Secondary | ICD-10-CM | POA: Diagnosis not present

## 2022-07-31 NOTE — Patient Instructions (Signed)
Devin Huffman , Thank you for taking time to come for your Medicare Wellness Visit. I appreciate your ongoing commitment to your health goals. Please review the following plan we discussed and let me know if I can assist you in the future.   Screening recommendations/referrals: Colonoscopy: aged out Recommended yearly ophthalmology/optometry visit for glaucoma screening and checkup Recommended yearly dental visit for hygiene and checkup  Vaccinations: Influenza vaccine: 01/12/21 Pneumococcal vaccine: 11/11/14 Tdap vaccine: 07/12/20 Shingles vaccine: Zostavax 10/13/13   Covid-19: 04/19/20, 05/11/20  Advanced directives: no  Conditions/risks identified: none  Next appointment: Follow up in one year for your annual wellness visit. 08/02/23 @ 11 am by phone  Preventive Care 65 Years and Older, Male Preventive care refers to lifestyle choices and visits with your health care provider that can promote health and wellness. What does preventive care include? A yearly physical exam. This is also called an annual well check. Dental exams once or twice a year. Routine eye exams. Ask your health care provider how often you should have your eyes checked. Personal lifestyle choices, including: Daily care of your teeth and gums. Regular physical activity. Eating a healthy diet. Avoiding tobacco and drug use. Limiting alcohol use. Practicing safe sex. Taking low doses of aspirin every day. Taking vitamin and mineral supplements as recommended by your health care provider. What happens during an annual well check? The services and screenings done by your health care provider during your annual well check will depend on your age, overall health, lifestyle risk factors, and family history of disease. Counseling  Your health care provider may ask you questions about your: Alcohol use. Tobacco use. Drug use. Emotional well-being. Home and relationship well-being. Sexual activity. Eating habits. History  of falls. Memory and ability to understand (cognition). Work and work Statistician. Screening  You may have the following tests or measurements: Height, weight, and BMI. Blood pressure. Lipid and cholesterol levels. These may be checked every 5 years, or more frequently if you are over 78 years old. Skin check. Lung cancer screening. You may have this screening every year starting at age 32 if you have a 30-pack-year history of smoking and currently smoke or have quit within the past 15 years. Fecal occult blood test (FOBT) of the stool. You may have this test every year starting at age 78. Flexible sigmoidoscopy or colonoscopy. You may have a sigmoidoscopy every 5 years or a colonoscopy every 10 years starting at age 78. Prostate cancer screening. Recommendations will vary depending on your family history and other risks. Hepatitis C blood test. Hepatitis B blood test. Sexually transmitted disease (STD) testing. Diabetes screening. This is done by checking your blood sugar (glucose) after you have not eaten for a while (fasting). You may have this done every 1-3 years. Abdominal aortic aneurysm (AAA) screening. You may need this if you are a current or former smoker. Osteoporosis. You may be screened starting at age 78 if you are at high risk. Talk with your health care provider about your test results, treatment options, and if necessary, the need for more tests. Vaccines  Your health care provider may recommend certain vaccines, such as: Influenza vaccine. This is recommended every year. Tetanus, diphtheria, and acellular pertussis (Tdap, Td) vaccine. You may need a Td booster every 10 years. Zoster vaccine. You may need this after age 39. Pneumococcal 13-valent conjugate (PCV13) vaccine. One dose is recommended after age 47. Pneumococcal polysaccharide (PPSV23) vaccine. One dose is recommended after age 23. Talk to your health  care provider about which screenings and vaccines you need  and how often you need them. This information is not intended to replace advice given to you by your health care provider. Make sure you discuss any questions you have with your health care provider. Document Released: 12/24/2015 Document Revised: 08/16/2016 Document Reviewed: 09/28/2015 Elsevier Interactive Patient Education  2017 [employer] Prevention in the Home Falls can cause injuries. They can happen to people of all ages. There are many things you can do to make your home safe and to help prevent falls. What can I do on the outside of my home? Regularly fix the edges of walkways and driveways and fix any cracks. Remove anything that might make you trip as you walk through a door, such as a raised step or threshold. Trim any bushes or trees on the path to your home. Use bright outdoor lighting. Clear any walking paths of anything that might make someone trip, such as rocks or tools. Regularly check to see if handrails are loose or broken. Make sure that both sides of any steps have handrails. Any raised decks and porches should have guardrails on the edges. Have any leaves, snow, or ice cleared regularly. Use sand or salt on walking paths during winter. Clean up any spills in your garage right away. This includes oil or grease spills. What can I do in the bathroom? Use night lights. Install grab bars by the toilet and in the tub and shower. Do not use towel bars as grab bars. Use non-skid mats or decals in the tub or shower. If you need to sit down in the shower, use a plastic, non-slip stool. Keep the floor dry. Clean up any water that spills on the floor as soon as it happens. Remove soap buildup in the tub or shower regularly. Attach bath mats securely with double-sided non-slip rug tape. Do not have throw rugs and other things on the floor that can make you trip. What can I do in the bedroom? Use night lights. Make sure that you have a light by your bed that is easy  to reach. Do not use any sheets or blankets that are too big for your bed. They should not hang down onto the floor. Have a firm chair that has side arms. You can use this for support while you get dressed. Do not have throw rugs and other things on the floor that can make you trip. What can I do in the kitchen? Clean up any spills right away. Avoid walking on wet floors. Keep items that you use a lot in easy-to-reach places. If you need to reach something above you, use a strong step stool that has a grab bar. Keep electrical cords out of the way. Do not use floor polish or wax that makes floors slippery. If you must use wax, use non-skid floor wax. Do not have throw rugs and other things on the floor that can make you trip. What can I do with my stairs? Do not leave any items on the stairs. Make sure that there are handrails on both sides of the stairs and use them. Fix handrails that are broken or loose. Make sure that handrails are as long as the stairways. Check any carpeting to make sure that it is firmly attached to the stairs. Fix any carpet that is loose or worn. Avoid having throw rugs at the top or bottom of the stairs. If you do have throw rugs, attach them to  the floor with carpet tape. Make sure that you have a light switch at the top of the stairs and the bottom of the stairs. If you do not have them, ask someone to add them for you. What else can I do to help prevent falls? Wear shoes that: Do not have high heels. Have rubber bottoms. Are comfortable and fit you well. Are closed at the toe. Do not wear sandals. If you use a stepladder: Make sure that it is fully opened. Do not climb a closed stepladder. Make sure that both sides of the stepladder are locked into place. Ask someone to hold it for you, if possible. Clearly mark and make sure that you can see: Any grab bars or handrails. First and last steps. Where the edge of each step is. Use tools that help you move  around (mobility aids) if they are needed. These include: Canes. Walkers. Scooters. Crutches. Turn on the lights when you go into a dark area. Replace any light bulbs as soon as they burn out. Set up your furniture so you have a clear path. Avoid moving your furniture around. If any of your floors are uneven, fix them. If there are any pets around you, be aware of where they are. Review your medicines with your doctor. Some medicines can make you feel dizzy. This can increase your chance of falling. Ask your doctor what other things that you can do to help prevent falls. This information is not intended to replace advice given to you by your health care provider. Make sure you discuss any questions you have with your health care provider. Document Released: 09/23/2009 Document Revised: 05/04/2016 Document Reviewed: 01/01/2015 Elsevier Interactive Patient Education  2017 [employer]. a

## 2022-07-31 NOTE — Progress Notes (Signed)
Virtual Visit via Telephone Note  I connected with  Devin Huffman on 07/31/22 at  1:00 PM EDT by telephone and verified that I am speaking with the correct person using two identifiers.  Location: Patient: home Provider: [place] Persons participating in the virtual visit: [place]   I discussed the limitations, risks, security and privacy concerns of performing an evaluation and management service by telephone and the availability of in person appointments. The patient expressed understanding and agreed to proceed.  Interactive audio and video telecommunications were attempted between this nurse and patient, however failed, due to patient having technical difficulties OR patient did not have access to video capability.  We continued and completed visit with audio only.  Some vital signs may be absent or patient reported.   Dionisio David, LPN  Subjective:   Devin Huffman is a 76 y.o. male who presents for Medicare Annual/Subsequent preventive examination.  Review of Systems     Cardiac Risk Factors include: advanced age (>83mn, >>31women);diabetes mellitus;hypertension;male gender     Objective:    There were no vitals filed for this visit. There is no height or weight on file to calculate BMI.     07/31/2022    1:04 PM 06/02/2022    9:11 AM 07/14/2021    2:51 PM 07/12/2020    9:03 AM 01/23/2020    7:11 PM 01/22/2020    3:37 PM 05/27/2018    8:12 AM  Advanced Directives  Does Patient Have a Medical Advance Directive? No No No No  No No  Does patient want to make changes to medical advance directive?       No - Patient declined  Would patient like information on creating a medical advance directive? No - Patient declined No - Patient declined No - Patient declined No - Patient declined No - Patient declined      Current Medications (verified) Outpatient Encounter Medications as of 07/31/2022  Medication Sig   amLODipine (NORVASC) 10 MG tablet  TAKE ONE TABLET BY MOUTH EVERYDAY AT BEDTIME   Ascorbic Acid (VITAMIN C) 1000 MG tablet Take 1,000 mg by mouth every evening.    aspirin EC 81 MG tablet Take 81 mg by mouth every evening.    blood glucose meter kit and supplies KIT Dispense based on patient and insurance preference. Use to check sugars up to twice daily as directed (E11.69).   Cholecalciferol (VITAMIN D3) 50 MCG (2000 UT) capsule Take 50 mcg by mouth every evening.    Coenzyme Q10 50 MG CAPS Take 1 capsule (50 mg total) by mouth daily.   glucose blood (GLUCOSE METER TEST) test strip Use as instructed to check sugars twice daily as needed E11.69   metFORMIN (GLUCOPHAGE-XR) 750 MG 24 hr tablet Take 2 tablets (1,500 mg total) by mouth daily with breakfast.   metoprolol succinate (TOPROL-XL) 25 MG 24 hr tablet TAKE ONE TABLET BY MOUTH EVERYDAY AT BEDTIME   Omega-3 Fatty Acids (FISH OIL) 1000 MG CAPS Take 1 capsule (1,000 mg total) by mouth 2 (two) times a day.   rosuvastatin (CRESTOR) 5 MG tablet TAKE ONE TABLET BY MOUTH ONCE DAILY   Semaglutide,0.25 or 0.5MG/DOS, (OZEMPIC, 0.25 OR 0.5 MG/DOSE,) 2 MG/1.5ML SOPN Inject 0.5 mg into the skin once a week.   vitamin B-12 (CYANOCOBALAMIN) 1000 MCG tablet Take 1,000 mcg by mouth daily.   [DISCONTINUED] lisinopril (PRINIVIL,ZESTRIL) 5 MG tablet Take 1 tablet (5 mg total) by mouth daily.   No  facility-administered encounter medications on file as of 07/31/2022.    Allergies (verified) Lisinopril   History: Past Medical History:  Diagnosis Date   Cancer ([place])    skin cancer removed from nose   Cerumen impaction 2016   bilateral with otitis externa s/p ENT removal - [facility]   Diabetes mellitus without complication ([place])    GERD (gastroesophageal reflux disease)    occasional   Gout    ?due to L great toe pain, saw Dr Gladstone Lighter   History of chicken pox    Hyperlipidemia    Hypertension    Legg-Perthes disease    Right hip   Nasal septal deviation    monitoring - [facility]    Pneumonia due to COVID-19 virus 01/21/2020   Prediabetes 03/22/2009   Sepsis ([place])    In March 2018   Past Surgical History:  Procedure Laterality Date   COLONOSCOPY  03/2011   2 tubular adenomas, rec rpt 5 yrs   COLONOSCOPY  01/2018   diverticulosis, rpt 5 yrs [facility])   HERNIA REPAIR     double hernia   LUMBAR FUSION  04/2013   transforaminal interbody fusion L4/5 ([place]) for R L5 radiculopathy, 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   Hypertension Maternal Uncle    Stroke Maternal Uncle    Coronary artery disease Paternal Uncle        MI   Cancer Other        niece-2 types   Stroke Maternal Grandfather    Social History   Socioeconomic History   Marital status: Married    Spouse name: Not on file   Number of children: Not on file   Years of education: Not on file   Highest education level: Not on file  Occupational History   Occupation: part-time  Tobacco Use   Smoking status: Never   Smokeless tobacco: Never  Vaping Use   Vaping Use: Never used  Substance and Sexual Activity   Alcohol use: No   Drug use: No   Sexual activity: Yes  Other Topics Concern   Not on file  Social History Narrative   Caffeine: few cups/day   Lives with wife, no pets   Occupation-Structural Museum/gallery conservator, "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  No regular exercise.   Diet: "I love my ice cream. I can't live  forever", some water   Social Determinants of Health   Financial Resource Strain: Low Risk  (07/31/2022)   Overall Financial Resource Strain (CARDIA)    Difficulty of Paying Living Expenses: Not hard at all  Food Insecurity: No Food Insecurity (07/31/2022)   Hunger Vital Sign    Worried About Running Out of Food in the Last Year: Never true    Ran Out of Food in the Last Year: Never true  Transportation Needs: No Transportation Needs (07/31/2022)   PRAPARE - Hydrologist (Medical): No    Lack of Transportation (Non-Medical): No  Physical Activity: Insufficiently Active (07/31/2022)   Exercise Vital Sign    Days of Exercise per Week: 3 days    Minutes of Exercise per Session: 30 min  Stress: No Stress Concern Present (07/31/2022)   [place]    Feeling of Stress : Not at all  Social Connections: Moderately Integrated (07/31/2022)   Social Connection and Isolation Panel [NHANES]    Frequency of Communication with Friends and Family: More than three times a week    Frequency of Social Gatherings with Friends and Family: More than three times a week    Attends Religious Services: More than 4 times per year    Active Member of [employer] or Organizations: No    Attends Music therapist: Never    Marital Status: Married    Tobacco Counseling Counseling given: Not Answered   Clinical Intake:  Pre-visit preparation completed: Yes  Pain : No/denies pain     Nutritional Risks: None Diabetes: Yes CBG done?: No Did pt. bring in CBG monitor from home?: No  How often do you need to have someone help you when you read instructions, pamphlets, or other written materials from your doctor or pharmacy?: 1 - Never  Diabetic? yes Nutrition Risk Assessment:  Has the patient had any N/V/D within the last 2 months?  No  Does the patient have any non-healing wounds?  No  Has the patient had  any unintentional weight loss or weight gain?  No   Diabetes:  Is the patient diabetic?  Yes  If diabetic, was a CBG obtained today?  No  Did the patient bring in their glucometer from home?  No  How often do you monitor your CBG's? occasionally.   Financial Strains and Diabetes Management:  Are you having any financial strains with the device, your supplies or your medication? No .  Does the patient want to be seen by Chronic Care Management for management of their diabetes?  No  Would the patient like to be referred to a Nutritionist or for Diabetic Management?  No   Diabetic Exams:  Diabetic Eye Exam: Completed 02/02/22.  Pt has been advised about the importance in completing this exam.   Diabetic Foot Exam: Completed 02/15/22. Pt has been advised about the importance in completing this exam.   Interpreter Needed?: No  Information entered by :: Kirke Shaggy, LPN   Activities of Daily Living    07/31/2022    1:05 PM  In your present state of health, do you have any difficulty performing the following activities:  Hearing? 0  Vision? 0  Difficulty concentrating or making decisions? 0  Walking or climbing stairs? 0  Dressing or bathing? 0  Doing errands, shopping? 0  Preparing Food and eating ? N  Using the Toilet? N  In the past six months, have you accidently leaked urine? N  Do you have problems with loss of bowel control? N  Managing your Medications? N  Managing your Finances? N  Housekeeping or managing your Housekeeping? N    Patient Care Team: Ria Bush, MD as PCP - General (Family Medicine) Charlton Haws, [facility] as Pharmacist (Pharmacist)  Indicate any recent Medical Services you may have received from other than [facility] providers in the past year (date may be approximate).  Assessment:   This is a routine wellness examination for Devin Huffman.  Hearing/Vision screen Hearing Screening - Comments:: No aids Vision Screening - Comments:: Readers-  Dr.Hecker  Dietary issues and exercise activities discussed: Current Exercise Habits: Home exercise routine, Type of exercise: walking, Time (Minutes): 30, Frequency (Times/Week): 3, Weekly Exercise (Minutes/Week): 90, Intensity: Mild   Goals Addressed             This Visit's Progress    DIET - EAT MORE FRUITS AND VEGETABLES         Depression Screen    07/31/2022    1:01 PM 06/02/2022    9:11 AM 07/14/2021    2:53 PM 07/12/2020    9:05 AM 07/10/2019    9:33 AM 05/27/2018    8:11 AM 05/22/2017    1:56 PM  PHQ 2/9 Scores  PHQ - 2 Score 0 0 0 0 0 0 0  PHQ- 9 Score 0  0 0  0     Fall Risk    07/31/2022    1:05 PM 07/04/2022   10:31 AM 06/02/2022    9:10 AM 07/14/2021    2:52 PM 07/12/2020    9:04 AM  [place] in the past year? 0 0 0 0 0  Number falls in past yr: 0   0 0  Injury with Fall? 0   0 0  Risk for fall due to : No Fall Risks   Medication side effect Medication side effect  Follow up Falls evaluation completed   Falls evaluation completed;Falls prevention discussed Falls evaluation completed;Falls prevention discussed    FALL RISK PREVENTION PERTAINING TO THE HOME:  Any stairs in or around the home? Yes  If so, are there any without handrails? No  Home free of loose throw rugs in walkways, pet beds, electrical cords, etc? Yes  Adequate lighting in your home to reduce risk of falls? Yes   ASSISTIVE DEVICES UTILIZED TO PREVENT FALLS:  Life alert? No  Use of a cane, walker or w/c? No  Grab bars in the bathroom? No  Shower chair or bench in shower? No  Elevated toilet seat or a handicapped toilet? No    Cognitive Function:    07/14/2021    2:56 PM 07/12/2020    9:08 AM 05/27/2018    8:12 AM 05/22/2017    1:56 PM  MMSE - Mini Mental State Exam  Not completed: Refused     Orientation to time  5 5 5   Orientation to Place  5 5 5   Registration  3 3 3   Attention/ Calculation  5 0 0  Recall  3 3 3   Language- name 2 objects   0 0  Language- repeat  1 1 1    Language- follow 3 step command   3 3  Language- read & follow direction   0 0  Write a sentence   0 0  Copy design   0 0  Total score   20 20        07/31/2022    1:06 PM  6CIT Screen  What Year? 0 points  What month? 0 points  What time? 0 points  Count back from 20 0 points  Months in reverse 0 points  Repeat phrase 0 points  Total Score 0 points    Immunizations Immunization History  Administered Date(s) Administered   Fluad Quad(high Dose 65+) 01/12/2021   Influenza Split 10/15/2012   Influenza,inj,Quad PF,6+ Mos 11/11/2014, 11/17/2015, 08/31/2016, 11/28/2017,  10/21/2018   PFIZER(Purple Top)SARS-COV-2 Vaccination 04/19/2020, 05/11/2020   Pneumococcal Conjugate-13 11/11/2014   Pneumococcal Polysaccharide-23 10/15/2012   Td 12/12/1996, 03/22/2009, 07/12/2020   Zoster, Live 10/13/2013    TDAP status: Up to date  Flu Vaccine status: Up to date  Pneumococcal vaccine status: Up to date  Covid-19 vaccine status: Completed vaccines  Qualifies for Shingles Vaccine? Yes   Zostavax completed Yes   Shingrix Completed?: No.    Education has been provided regarding the importance of this vaccine. Patient has been advised to call insurance company to determine out of pocket expense if they have not yet received this vaccine. Advised may also receive vaccine at local pharmacy or Health Dept. Verbalized acceptance and understanding.  Screening Tests Health Maintenance  Topic Date Due   Zoster Vaccines- Shingrix (1 of 2) Never done   COVID-19 Vaccine (3 - Pfizer risk series) 06/08/2020   URINE MICROALBUMIN  07/12/2021   INFLUENZA VACCINE  07/11/2022   HEMOGLOBIN A1C  08/18/2022   COLONOSCOPY (Pts 45-65yr Insurance coverage will need to be confirmed)  01/22/2023   OPHTHALMOLOGY EXAM  02/02/2023   FOOT EXAM  02/16/2023   TETANUS/TDAP  07/12/2030   Pneumonia Vaccine 78 Years old  Completed   Hepatitis C Screening  Completed   HPV VACCINES  Aged Out    Health  Maintenance  Health Maintenance Due  Topic Date Due   Zoster Vaccines- Shingrix (1 of 2) Never done   COVID-19 Vaccine (3 - Pfizer risk series) 06/08/2020   URINE MICROALBUMIN  07/12/2021   INFLUENZA VACCINE  07/11/2022    Colorectal cancer screening: No longer required.   Lung Cancer Screening: (Low Dose CT Chest recommended if Age 78-80years, 30 pack-year currently smoking OR have quit w/in 15years.) does not qualify.   Additional Screening:  Hepatitis C Screening: does qualify; Completed 08/30/16  Vision Screening: Recommended annual ophthalmology exams for early detection of glaucoma and other disorders of the eye. Is the patient up to date with their annual eye exam?  Yes  Who is the provider or what is the name of the office in which the patient attends annual eye exams? Dr.Hecker If pt is not established with a provider, would they like to be referred to a provider to establish care? No .   Dental Screening: Recommended annual dental exams for proper oral hygiene  Community Resource Referral / Chronic Care Management: CRR required this visit?  No   CCM required this visit?  No      Plan:     I have personally reviewed and noted the following in the patient's chart:   Medical and social history Use of alcohol, tobacco or illicit drugs  Current medications and supplements including opioid prescriptions. Patient is not currently taking opioid prescriptions. Functional ability and status Nutritional status Physical activity Advanced directives List of other physicians Hospitalizations, surgeries, and ER visits in previous 12 months Vitals Screenings to include cognitive, depression, and falls Referrals and appointments  In addition, I have reviewed and discussed with patient certain preventive protocols, quality metrics, and best practice recommendations. A written personalized care plan for preventive services as well as general preventive health recommendations  were provided to patient.     LDionisio David LPN   [id number]  Nurse Notes: none

## 2022-08-13 ENCOUNTER — Emergency Department (HOSPITAL_COMMUNITY): Payer: Medicare HMO

## 2022-08-13 ENCOUNTER — Other Ambulatory Visit (HOSPITAL_COMMUNITY): Payer: Medicare HMO

## 2022-08-13 ENCOUNTER — Other Ambulatory Visit: Payer: Self-pay

## 2022-08-13 ENCOUNTER — Encounter (HOSPITAL_COMMUNITY): Payer: Self-pay | Admitting: Emergency Medicine

## 2022-08-13 ENCOUNTER — Inpatient Hospital Stay (HOSPITAL_COMMUNITY)
Admission: EM | Admit: 2022-08-13 | Discharge: 2022-08-16 | DRG: 308 | Disposition: A | Discharge status: Home / Self Care | Payer: Medicare HMO | Attending: Internal Medicine | Admitting: Internal Medicine

## 2022-08-13 DIAGNOSIS — J810 Acute pulmonary edema: Secondary | ICD-10-CM

## 2022-08-13 DIAGNOSIS — E1169 Type 2 diabetes mellitus with other specified complication: Secondary | ICD-10-CM | POA: Diagnosis not present

## 2022-08-13 DIAGNOSIS — Z981 Arthrodesis status: Secondary | ICD-10-CM

## 2022-08-13 DIAGNOSIS — Z794 Long term (current) use of insulin: Secondary | ICD-10-CM | POA: Diagnosis not present

## 2022-08-13 DIAGNOSIS — I10 Essential (primary) hypertension: Secondary | ICD-10-CM | POA: Diagnosis not present

## 2022-08-13 DIAGNOSIS — Z96641 Presence of right artificial hip joint: Secondary | ICD-10-CM | POA: Diagnosis present

## 2022-08-13 DIAGNOSIS — Z8249 Family history of ischemic heart disease and other diseases of the circulatory system: Secondary | ICD-10-CM

## 2022-08-13 DIAGNOSIS — E1140 Type 2 diabetes mellitus with diabetic neuropathy, unspecified: Secondary | ICD-10-CM | POA: Diagnosis not present

## 2022-08-13 DIAGNOSIS — E669 Obesity, unspecified: Secondary | ICD-10-CM | POA: Diagnosis not present

## 2022-08-13 DIAGNOSIS — E785 Hyperlipidemia, unspecified: Secondary | ICD-10-CM | POA: Diagnosis not present

## 2022-08-13 DIAGNOSIS — I110 Hypertensive heart disease with heart failure: Secondary | ICD-10-CM | POA: Diagnosis not present

## 2022-08-13 DIAGNOSIS — Z79899 Other long term (current) drug therapy: Secondary | ICD-10-CM

## 2022-08-13 DIAGNOSIS — J811 Chronic pulmonary edema: Secondary | ICD-10-CM | POA: Diagnosis not present

## 2022-08-13 DIAGNOSIS — Z888 Allergy status to other drugs, medicaments and biological substances status: Secondary | ICD-10-CM | POA: Diagnosis not present

## 2022-08-13 DIAGNOSIS — Z6830 Body mass index (BMI) 30.0-30.9, adult: Secondary | ICD-10-CM

## 2022-08-13 DIAGNOSIS — I5031 Acute diastolic (congestive) heart failure: Secondary | ICD-10-CM | POA: Diagnosis present

## 2022-08-13 DIAGNOSIS — K219 Gastro-esophageal reflux disease without esophagitis: Secondary | ICD-10-CM | POA: Diagnosis not present

## 2022-08-13 DIAGNOSIS — Z7984 Long term (current) use of oral hypoglycemic drugs: Secondary | ICD-10-CM | POA: Diagnosis not present

## 2022-08-13 DIAGNOSIS — J90 Pleural effusion, not elsewhere classified: Secondary | ICD-10-CM

## 2022-08-13 DIAGNOSIS — Z7985 Long-term (current) use of injectable non-insulin antidiabetic drugs: Secondary | ICD-10-CM

## 2022-08-13 DIAGNOSIS — I4891 Unspecified atrial fibrillation: Principal | ICD-10-CM | POA: Diagnosis present

## 2022-08-13 DIAGNOSIS — J9811 Atelectasis: Secondary | ICD-10-CM | POA: Diagnosis not present

## 2022-08-13 DIAGNOSIS — I499 Cardiac arrhythmia, unspecified: Secondary | ICD-10-CM | POA: Diagnosis not present

## 2022-08-13 DIAGNOSIS — J9601 Acute respiratory failure with hypoxia: Secondary | ICD-10-CM | POA: Diagnosis not present

## 2022-08-13 DIAGNOSIS — R600 Localized edema: Secondary | ICD-10-CM | POA: Diagnosis present

## 2022-08-13 DIAGNOSIS — Z7982 Long term (current) use of aspirin: Secondary | ICD-10-CM | POA: Diagnosis not present

## 2022-08-13 DIAGNOSIS — I509 Heart failure, unspecified: Secondary | ICD-10-CM | POA: Diagnosis not present

## 2022-08-13 DIAGNOSIS — Z743 Need for continuous supervision: Secondary | ICD-10-CM | POA: Diagnosis not present

## 2022-08-13 DIAGNOSIS — Z9852 Vasectomy status: Secondary | ICD-10-CM | POA: Diagnosis not present

## 2022-08-13 DIAGNOSIS — Z7901 Long term (current) use of anticoagulants: Secondary | ICD-10-CM

## 2022-08-13 DIAGNOSIS — R000 Tachycardia, unspecified: Secondary | ICD-10-CM | POA: Diagnosis not present

## 2022-08-13 DIAGNOSIS — M109 Gout, unspecified: Secondary | ICD-10-CM | POA: Diagnosis present

## 2022-08-13 DIAGNOSIS — R079 Chest pain, unspecified: Secondary | ICD-10-CM | POA: Diagnosis not present

## 2022-08-13 DIAGNOSIS — Z8616 Personal history of COVID-19: Secondary | ICD-10-CM

## 2022-08-13 DIAGNOSIS — Z85828 Personal history of other malignant neoplasm of skin: Secondary | ICD-10-CM

## 2022-08-13 HISTORY — DX: Acute pulmonary edema: J81.0

## 2022-08-13 HISTORY — DX: Pleural effusion, not elsewhere classified: J90

## 2022-08-13 HISTORY — DX: Unspecified atrial fibrillation: I48.91

## 2022-08-13 LAB — BASIC METABOLIC PANEL
Anion gap: 7 (ref 5–15)
BUN: 17 mg/dL (ref 8–23)
CO2: 19 mmol/L — ABNORMAL LOW (ref 22–32)
Calcium: 8.4 mg/dL — ABNORMAL LOW (ref 8.9–10.3)
Chloride: 112 mmol/L — ABNORMAL HIGH (ref 98–111)
Creatinine, Ser: 1.09 mg/dL (ref 0.61–1.24)
GFR, Estimated: >60 mL/min (ref >60)
Glucose, Bld: 120 mg/dL — ABNORMAL HIGH (ref 70–99)
Potassium: 4.1 mmol/L (ref 3.5–5.1)
Sodium: 138 mmol/L (ref 135–145)

## 2022-08-13 LAB — CBC
HCT: 39.3 % (ref 39.0–52.0)
Hemoglobin: 13.2 g/dL (ref 13.0–17.0)
MCH: 33.1 pg (ref 26.0–34.0)
MCHC: 33.6 g/dL (ref 30.0–36.0)
MCV: 98.5 fL (ref 80.0–100.0)
Platelets: 201 10*3/uL (ref 150–400)
RBC: 3.99 MIL/uL — ABNORMAL LOW (ref 4.22–5.81)
RDW: 12.3 % (ref 11.5–15.5)
WBC: 10.8 10*3/uL — ABNORMAL HIGH (ref 4.0–10.5)
nRBC: 0 % (ref 0.0–0.2)

## 2022-08-13 LAB — D-DIMER, QUANTITATIVE: D-Dimer, Quant: 0.34 ug/mL-FEU (ref 0.00–0.50)

## 2022-08-13 LAB — TROPONIN I (HIGH SENSITIVITY)
Troponin I (High Sensitivity): 4 ng/L (ref <18)
Troponin I (High Sensitivity): 3 ng/L (ref <18)

## 2022-08-13 LAB — BRAIN NATRIURETIC PEPTIDE: B Natriuretic Peptide: 439.4 pg/mL — ABNORMAL HIGH (ref 0.0–100.0)

## 2022-08-13 LAB — GLUCOSE, CAPILLARY: Glucose-Capillary: 120 mg/dL — ABNORMAL HIGH (ref 70–99)

## 2022-08-13 MED ORDER — DILTIAZEM LOAD VIA INFUSION
10.0000 mg | Freq: Once | INTRAVENOUS | Status: AC
Start: 2022-08-13 — End: 2022-08-13
  Administered 2022-08-13: 10 mg via INTRAVENOUS
  Filled 2022-08-13: qty 10

## 2022-08-13 MED ORDER — DILTIAZEM HCL-DEXTROSE 125-5 MG/125ML-% IV SOLN (PREMIX)
5.0000 mg/h | INTRAVENOUS | Status: DC
  Administered 2022-08-13: 5 mg/h via INTRAVENOUS
  Administered 2022-08-14 – 2022-08-15 (×3): 10 mg/h via INTRAVENOUS
  Filled 2022-08-13 (×4): qty 125

## 2022-08-13 MED ORDER — DILTIAZEM HCL 25 MG/5ML IV SOLN: 10.0000 mg | Freq: Once | INTRAVENOUS | Status: DC

## 2022-08-13 MED ORDER — ACETAMINOPHEN 650 MG RE SUPP: 650.0000 mg | Freq: Four times a day (QID) | RECTAL | Status: DC | PRN

## 2022-08-13 MED ORDER — METOPROLOL TARTRATE 25 MG PO TABS
25.0000 mg | ORAL_TABLET | Freq: Two times a day (BID) | ORAL | Status: DC
  Administered 2022-08-13 – 2022-08-16 (×6): 25 mg via ORAL
  Filled 2022-08-13 (×6): qty 1

## 2022-08-13 MED ORDER — METOPROLOL TARTRATE 25 MG PO TABS: 25.0000 mg | ORAL_TABLET | Freq: Once | ORAL | Status: DC

## 2022-08-13 MED ORDER — FUROSEMIDE 10 MG/ML IJ SOLN
40.0000 mg | Freq: Every day | INTRAMUSCULAR | Status: DC
Start: 2022-08-14 — End: 2022-08-15
  Administered 2022-08-14 – 2022-08-15 (×2): 40 mg via INTRAVENOUS
  Filled 2022-08-13 (×2): qty 4

## 2022-08-13 MED ORDER — FUROSEMIDE 10 MG/ML IJ SOLN
40.0000 mg | Freq: Once | INTRAMUSCULAR | Status: AC
  Administered 2022-08-13: 40 mg via INTRAVENOUS
  Filled 2022-08-13: qty 4

## 2022-08-13 MED ORDER — ROSUVASTATIN CALCIUM 5 MG PO TABS
5.0000 mg | ORAL_TABLET | Freq: Every day | ORAL | Status: DC
  Administered 2022-08-13 – 2022-08-15 (×3): 5 mg via ORAL
  Filled 2022-08-13 (×3): qty 1

## 2022-08-13 MED ORDER — MORPHINE SULFATE (PF) 2 MG/ML IV SOLN: 2.0000 mg | INTRAVENOUS | Status: DC | PRN

## 2022-08-13 MED ORDER — APIXABAN 5 MG PO TABS
5.0000 mg | ORAL_TABLET | Freq: Two times a day (BID) | ORAL | Status: DC
  Administered 2022-08-13 – 2022-08-16 (×7): 5 mg via ORAL
  Filled 2022-08-13 (×7): qty 1

## 2022-08-13 MED ORDER — ACETAMINOPHEN 325 MG PO TABS: 650.0000 mg | ORAL_TABLET | Freq: Four times a day (QID) | ORAL | Status: DC | PRN

## 2022-08-13 NOTE — Consult Note (Signed)
Cardiology Consultation:   Patient ID: Devin Huffman; [id number]; 07-01-1946   Admit date: 08/13/2022 Date of Consult: 08/13/2022  Primary Care Provider: Ria Bush, MD Primary Cardiologist: None  Primary Electrophysiologist:  NA   Patient Profile:   Devin Huffman is a 76 y.o. male with a hx of DM, HTN and dyslipidemia who is being seen today for the evaluation of SOB and atrial fib at the request of Dr. Nicoletta Dress.  History of Present Illness:   Devin Huffman has no past cardiac history.  He does have cardiovascular risk factors as listed below.   He presented to the ED with SOB.  He was found to be in atrial fib.  He was noted to have vascular congestion on EKG.  BNP was mildly elevated.  He was treated with IV Cardizem and IV Lasix.  Trop x 2 was negative.     He says that he is active and has not had recent symptoms other than being a little more fatigued after working in the yard.  This has been slowly progressive.  He had some discomfort and SOB at rest last night.  He tried to sleep on some pillows to see if this would help.  This morning he was trying to walk outside to see if he would feel better and he noted 5/10 mild chest discomfort with SOB.  He did not have presyncope or syncope.  He did not have neck or arm pain.  He had no cough fevers or chills.  He has had no weight gain or edema.   Of note, he did have an echo in 2018.  This demonstrated normal LV function.  He had mild LAE.  This was done after he had sepsis following an orthopedic procedure.  He was hospitalized with respiratory failure from [place] in 2021.     Past Medical History:  Diagnosis Date   Cancer ([place])    skin cancer removed from nose   Cerumen impaction 2016   bilateral with otitis externa s/p ENT removal - [facility]   Diabetes mellitus without complication ([place])    GERD (gastroesophageal reflux disease)    occasional   Gout    ?due to L great toe pain, saw Dr Gladstone Lighter   History of chicken pox     Hyperlipidemia    Hypertension    Legg-Perthes disease    Right hip   Nasal septal deviation    monitoring - [facility]   Pneumonia due to COVID-19 virus 01/21/2020   Prediabetes 03/22/2009   Sepsis ([place])    In March 2018    Past Surgical History:  Procedure Laterality Date   COLONOSCOPY  03/2011   2 tubular adenomas, rec rpt 5 yrs   COLONOSCOPY  01/2018   diverticulosis, rpt 5 yrs [facility])   HERNIA REPAIR     double hernia   LUMBAR FUSION  04/2013   transforaminal interbody fusion L4/5 ([facility]) for R L5 radiculopathy, 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 Authorizing Provider  amLODipine (NORVASC) 10 MG tablet TAKE ONE TABLET BY MOUTH EVERYDAY AT BEDTIME  Patient taking differently: Take 10 mg by mouth at bedtime. 01/25/22  Yes Ria Bush, MD  Ascorbic Acid (VITAMIN C) 1000 MG tablet Take 1,000 mg by mouth every evening.    Yes [provider]  aspirin EC 81 MG tablet Take 81 mg by mouth every evening.  04/03/17  Yes Ria Bush, MD  Calcium Carbonate Antacid (TUMS CHEWY BITES PO) Take 2 tablets by mouth at bedtime as needed (reflux).   Yes [provider]  Cholecalciferol (VITAMIN D3) 25 MCG (1000 UT) CAPS Take 1,000 Units by mouth every evening.   Yes [provider]  Coenzyme Q10 (CO Q 10 PO) Take 1 capsule by mouth at bedtime.   Yes [provider]  Cyanocobalamin (VITAMIN B-12 PO) Take 1 capsule by mouth at bedtime.   Yes [provider]  metFORMIN (GLUCOPHAGE-XR) 750 MG 24 hr tablet Take 2 tablets (1,500 mg total) by mouth daily with  breakfast. 02/15/22  Yes Ria Bush, MD  metoprolol succinate (TOPROL-XL) 25 MG 24 hr tablet TAKE ONE TABLET BY MOUTH EVERYDAY AT BEDTIME Patient taking differently: Take 25 mg by mouth at bedtime. 04/25/22  Yes Ria Bush, MD  Multiple Vitamins-Minerals (EYE VITAMINS PO) Take 2 tablets by mouth at bedtime.   Yes [provider]  Omega-3 Fatty Acids (FISH OIL PO) Take 1 capsule by mouth daily.   Yes [provider]  rosuvastatin (CRESTOR) 5 MG tablet TAKE ONE TABLET BY MOUTH ONCE DAILY Patient taking differently: Take 5 mg by mouth at bedtime. 04/25/22  Yes Ria Bush, MD  Semaglutide,0.25 or 0.5MG/DOS, (OZEMPIC, 0.25 OR 0.5 MG/DOSE,) 2 MG/1.5ML SOPN Inject 0.5 mg into the skin once a week. 05/17/22  Yes Ria Bush, MD  blood glucose meter kit and supplies KIT Dispense based on patient and insurance preference. Use to check sugars up to twice daily as directed (E11.69). 06/28/21   Ria Bush, MD  glucose blood (GLUCOSE METER TEST) test strip Use as instructed to check sugars twice daily as needed E11.69 06/28/21   Ria Bush, MD  lisinopril (PRINIVIL,ZESTRIL) 5 MG tablet Take 1 tablet (5 mg total) by mouth daily. 10/02/11 10/11/18  Ria Bush, MD    Inpatient Medications: Scheduled Meds:  apixaban  5 mg Oral BID   [START ON 08/14/2022] furosemide  40 mg Intravenous Daily   rosuvastatin  5 mg Oral QHS   Continuous Infusions:  diltiazem (CARDIZEM) infusion 5 mg/hr (08/13/22 1309)   PRN Meds: acetaminophen **OR** acetaminophen, morphine injection  Allergies:    Allergies  Allergen Reactions   Lisinopril Hives and Swelling    angioedema - lip swelling, hives    Social History:   Social History   Socioeconomic History   Marital status: Married    Spouse name: Not on file   Number of children: Not on file   Years of education: Not on file   Highest education level: Not on file  Occupational History   Occupation: part-time   Tobacco Use   Smoking status: Never   Smokeless tobacco: Never  Vaping Use   Vaping Use: Never used  Substance and Sexual Activity   Alcohol use: No   Drug use: No   Sexual activity: Yes  Other Topics Concern   Not on file  Social History Narrative   Caffeine: few cups/day   Lives with wife, no pets   Occupation-Structural Museum/gallery conservator, "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  No regular exercise.  Diet: "I love my ice cream. I can't live forever", some water   Social Determinants of Health   Financial Resource Strain: Low Risk  (07/31/2022)   Overall Financial Resource Strain (CARDIA)    Difficulty of Paying Living Expenses: Not hard at all  Food Insecurity: No Food Insecurity (07/31/2022)   Hunger Vital Sign    Worried About Running Out of Food in the Last Year: Never true    Ran Out of Food in the Last Year: Never true  Transportation Needs: No Transportation Needs (07/31/2022)   PRAPARE - Hydrologist (Medical): No    Lack of Transportation (Non-Medical): No  Physical Activity: Insufficiently Active (07/31/2022)   Exercise Vital Sign    Days of Exercise per Week: 3 days    Minutes of Exercise per Session: 30 min  Stress: No Stress Concern Present (07/31/2022)   [place]    Feeling of Stress : Not at all  Social Connections: Moderately Integrated (07/31/2022)   Social Connection and Isolation Panel [NHANES]    Frequency of Communication with Friends and Family: More than three times a week    Frequency of Social Gatherings with Friends and Family: More than three times a week    Attends Religious Services: More than 4 times per year    Active Member of [employer] or Organizations: No    Attends Archivist Meetings: Never    Marital Status: Married  Human resources officer Violence: Not At Risk (07/31/2022)   Humiliation,  Afraid, Rape, and Kick questionnaire    Fear of Current or Ex-Partner: No    Emotionally Abused: No    Physically Abused: No    Sexually Abused: No    Family History:    Family History  Problem Relation Age of Onset   Breast cancer Mother        with recurrence   Alzheimer's disease Father    Cervical cancer Sister    Esophageal cancer Sister    Lung cancer Sister 2   Cancer Maternal Aunt        ?   Hypertension Maternal Uncle    Stroke Maternal Uncle    Coronary artery disease Paternal Uncle        MI   Cancer Other        niece-2 types   Stroke Maternal Grandfather      ROS:  Please see the history of present illness.   All other ROS reviewed and negative.     Physical Exam/Data:   Vitals:   08/13/22 1330 08/13/22 1345 08/13/22 1400 08/13/22 1415  BP: 128/85 122/74 123/71 127/76  Pulse: 64 77 (!) 138 82  Resp: _0 (!) 21  Temp:  97.7 F (36.5 C)    TempSrc:  Oral    SpO2: 92%  (!) 88% 92%  Weight:      Height:       No intake or output data in the 24 hours ending 08/13/22 1434 Filed Weights   08/13/22 1005  Weight: 102.1 kg   Body mass index is 30.52 kg/m.  GENERAL:  Well appearing HEENT:   Pupils equal round and reactive, fundi not visualized, oral mucosa unremarkable NECK:  No  jugular venous distention, waveform within normal limits, carotid upstroke brisk and symmetric, no bruits, no thyromegaly LYMPHATICS:  No cervical, inguinal adenopathy LUNGS:    Decreased breath sounds bilateral 1/3 of up with rales.  BACK:  No CVA tenderness CHEST:   Unremarkable HEART:  PMI not displaced or sustained,S1 and S2 within normal limits, no S3, no clicks, no rubs, no murmurs, irregular  ABD:  Flat, positive bowel sounds normal in frequency in pitch, no bruits, no rebound, no guarding, no midline pulsatile mass, no hepatomegaly, no splenomegaly EXT:  2 plus pulses throughout, no  edema, no cyanosis no clubbing SKIN:  No rashes no nodules NEURO:   Cranial  nerves II through XII grossly intact, motor grossly intact throughout PSYCH:    Cognitively intact, oriented to person place and time   EKG:  The EKG was personally reviewed and demonstrates:  Atrial fib, rate 118, LAD, LVH, QT mildly prolonged.  No acute ST T wave changes.    Atrial fib is new compared to previous EKG.  Telemetry:  Telemetry was personally reviewed and demonstrates:  NA  Relevant CV Studies: NA  Laboratory Data:  Chemistry Recent Labs  Lab 08/13/22 1030  NA 138  K 4.1  CL 112*  CO2 19*  GLUCOSE 120*  BUN 17  CREATININE 1.09  CALCIUM 8.4*  GFRNONAA >60  ANIONGAP 7    No results for input(s): "PROT", "ALBUMIN", "AST", "ALT", "ALKPHOS", "BILITOT" in the last 168 hours. Hematology Recent Labs  Lab 08/13/22 1030  WBC 10.8*  RBC 3.99*  HGB 13.2  HCT 39.3  MCV 98.5  MCH 33.1  MCHC 33.6  RDW 12.3  PLT 201   Cardiac EnzymesNo results for input(s): "TROPONINI" in the last 168 hours. No results for input(s): "TROPIPOC" in the last 168 hours.  BNP Recent Labs  Lab 08/13/22 1030  BNP 439.4*    DDimer  Recent Labs  Lab 08/13/22 1030  DDIMER 0.34    Radiology/Studies:  DG Chest 2 View  Result Date: 08/13/2022 CLINICAL DATA:  Chest pain. Shortness of breath. Lightheadedness when walking. EXAM: CHEST - 2 VIEW COMPARISON:  05/30/2021 FINDINGS: Stable cardiomediastinal contours. There is a new left pleural effusion. Pulmonary vascular congestion is identified on today's study. Mild atelectasis identified in the left base. Visualized osseous structures are unremarkable. IMPRESSION: New left pleural effusion and pulmonary vascular congestion suggestive of CHF. Electronically Signed   By: Kerby Moors M.D.   On: 08/13/2022 11:07    Assessment and Plan:   Acute HF:  Agree with IV diuresis.  Check echo in AM.  Stopping his Norvasc.  IV Cardizem is OK for now but will change based on EF and likely will need ARB/ARNi, beta blocker, [place].    Atrial fib with  RVR:  I am not anticipating and invasive evaluation this admission so I think it is reasonable to start [place] now.   Mr. DONNIS PHANEUF has a CHA2DS2 - VASc score of 3.  .     HTN:  This is being managed in the context of treating his CHF    DM:  Last A1c that I see was 6.9 one year ago.  He has had levels of up to 9.   Meds will be adjusted as above.   Chest pain:  No acute EKG changes or enzyme elevation.  I do not suspect an acute coronary syndrome.    Needs primary risk factor modification.     For questions or updates, please contact [facility] Please consult [email or web address] for contact info under Cardiology/STEMI.   Signed, Minus Breeding, MD  08/13/2022 2:34 PM

## 2022-08-13 NOTE — Discharge Instructions (Addendum)

## 2022-08-13 NOTE — H&P (Signed)
History and Physical    Devin Huffman Devin Huffman DOB: [birth year] DOA: 08/13/2022  PCP: Ria Bush, MD Patient coming from: Home  Chief Complaint: Chest pressure  HPI: Devin Huffman is a 76 y.o. male with medical history significant of T2DM, HTN, HLD, GERD, who presented with chest pressure.  Patient reports that he started to have chest pressure with SOB/DOE last night.  The chest pressure was located to his midsternal chest area, pressure-like feeling, mild to moderate with no radiation.  Denies fevers or palpitations.  Patient has had bilateral chronic ankle edema for a while. Patient received ASA 81 mg x 4 at a local fire department and was brought to the ED for further evaluation.  Denies cough, wheezing, nausea, vomiting, diarrhea, abdominal pain, dysuria, urinary frequency or urgency.  In the ED, he was afebrile with a pulse 123, RR 16, BP 141/91, room air O2 sats 87%.The labs showed nonrevealing CBC and BMP, elevated BNP 439, negative troponin, negative D-dimer.  EKG showed atrial fibrillation with RVR. CXR showed New left pleural effusion and pulmonary vascular congestion.   Review of Systems: As per HPI otherwise 10 point review of systems negative.  Review of Systems Otherwise negative except as per HPI, including: General: Denies fever, chills, night sweats or unintended weight loss. Resp: Denies cough, wheezing, shortness of breath. Cardiac: Denies chest pain, palpitations, orthopnea, paroxysmal nocturnal dyspnea.  Positive for chest pressure, SOB/DOE and legs edema.  GI: Denies abdominal pain, nausea, vomiting, diarrhea or constipation GU: Denies dysuria, frequency, hesitancy or incontinence MS: Denies muscle aches, joint pain or swelling Neuro: Denies headache, neurologic deficits (focal weakness, numbness, tingling), abnormal gait Psych: Denies anxiety, depression, SI/HI/AVH Skin: Denies new rashes or lesions ID: Denies sick contacts, exotic exposures,  travel  Past Medical History:  Diagnosis Date   Cancer ([place])    skin cancer removed from nose   Cerumen impaction 2016   bilateral with otitis externa s/p ENT removal - [place]   Diabetes mellitus without complication ([place])    GERD (gastroesophageal reflux disease)    occasional   Gout    ?due to L great toe pain, saw Dr Gladstone Lighter   History of chicken pox    Hyperlipidemia    Hypertension    Legg-Perthes disease    Right hip   Nasal septal deviation    monitoring - [place]   Pneumonia due to COVID-19 virus 01/21/2020   Prediabetes 03/22/2009   Sepsis ([place])    In March 2018    Past Surgical History:  Procedure Laterality Date   COLONOSCOPY  03/2011   2 tubular adenomas, rec rpt 5 yrs   COLONOSCOPY  01/2018   diverticulosis, rpt 5 yrs [facility])   HERNIA REPAIR     double hernia   LUMBAR FUSION  04/2013   transforaminal interbody fusion L4/5 ([place]onski) for R L5 radiculopathy, L4/5 spondylolisthesis, and 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 He has never used smokeless tobacco. He reports that he does not drink alcohol and does not use drugs.  Allergies  Allergen Reactions   Lisinopril Hives and Swelling    angioedema - lip swelling, hives  FAMILY HISTORY: Family History  Problem Relation Age of Onset   Breast cancer Mother        with recurrence   Alzheimer's disease Father    Cervical cancer Sister    Esophageal cancer Sister    Lung cancer Sister 54   Cancer Maternal Aunt        ?   Hypertension Maternal Uncle    Stroke Maternal Uncle    Coronary artery disease Paternal Uncle        MI   Cancer Other        niece-2 types   Stroke Maternal  Grandfather      Prior to Admission medications   Medication Sig Start Date End Date Taking? Authorizing Provider  amLODipine (NORVASC) 10 MG tablet TAKE ONE TABLET BY MOUTH EVERYDAY AT BEDTIME Patient taking differently: Take 10 mg by mouth at bedtime. 01/25/22  Yes Ria Bush, MD  Ascorbic Acid (VITAMIN C) 1000 MG tablet Take 1,000 mg by mouth every evening.    Yes [provider]  aspirin EC 81 MG tablet Take 81 mg by mouth every evening.  04/03/17  Yes Ria Bush, MD  Calcium Carbonate Antacid (TUMS CHEWY BITES PO) Take 2 tablets by mouth at bedtime as needed (reflux).   Yes [provider]  Cholecalciferol (VITAMIN D3) 25 MCG (1000 UT) CAPS Take 1,000 Units by mouth every evening.   Yes [provider]  Coenzyme Q10 (CO Q 10 PO) Take 1 capsule by mouth at bedtime.   Yes [provider]  Cyanocobalamin (VITAMIN B-12 PO) Take 1 capsule by mouth at bedtime.   Yes [provider]  metFORMIN (GLUCOPHAGE-XR) 750 MG 24 hr tablet Take 2 tablets (1,500 mg total) by mouth daily with breakfast. 02/15/22  Yes Ria Bush, MD  metoprolol succinate (TOPROL-XL) 25 MG 24 hr tablet TAKE ONE TABLET BY MOUTH EVERYDAY AT BEDTIME Patient taking differently: Take 25 mg by mouth at bedtime. 04/25/22  Yes Ria Bush, MD  Multiple Vitamins-Minerals (EYE VITAMINS PO) Take 2 tablets by mouth at bedtime.   Yes [provider]  Omega-3 Fatty Acids (FISH OIL PO) Take 1 capsule by mouth daily.   Yes [provider]  rosuvastatin (CRESTOR) 5 MG tablet TAKE ONE TABLET BY MOUTH ONCE DAILY Patient taking differently: Take 5 mg by mouth at bedtime. 04/25/22  Yes Ria Bush, MD  Semaglutide,0.25 or 0.5MG/DOS, (OZEMPIC, 0.25 OR 0.5 MG/DOSE,) 2 MG/1.5ML SOPN Inject 0.5 mg into the skin once a week. 05/17/22  Yes Ria Bush, MD  blood glucose meter kit and supplies KIT Dispense based on patient and insurance preference. Use to check  sugars up to twice daily as directed (E11.69). 06/28/21   Ria Bush, MD  glucose blood (GLUCOSE METER TEST) test strip Use as instructed to check sugars twice daily as needed E11.69 06/28/21   Ria Bush, MD  lisinopril (PRINIVIL,ZESTRIL) 5 MG tablet Take 1 tablet (5 mg total) by mouth daily. 10/02/11 10/11/18  Ria Bush, MD    Physical Exam: Vitals:   08/13/22 1130 08/13/22 1145 08/13/22 1200 08/13/22 1345  BP: 125/71 129/80 104/64   Pulse: (!) 45 (!) 52 71   Resp: 16 16 14    Temp:    97.7 F (36.5 C)  TempSrc:    Oral  SpO2: 91% 91% 93%   Weight:      Height:          Constitutional: NAD, calm, comfortable.  Acute ill-appearing. Eyes: PERRL, lids and conjunctivae  normal ENMT: Mucous membranes are moist. Posterior pharynx clear of any exudate or lesions.Normal dentition.  Neck: normal, supple, no masses, no thyromegaly Respiratory: Bibasilar lung sounds diminished with mild to moderate rales to the bases.  No wheezing. Normal respiratory effort. No accessory muscle use.  Cardiovascular: Regular rate and rhythm, no murmurs / rubs / gallops. B/L 2+ extremity edema. 2+ pedal pulses. No carotid bruits.  Abdomen: no tenderness, no masses palpated. No hepatosplenomegaly. Bowel sounds positive.  Musculoskeletal: no clubbing / cyanosis. No joint deformity upper and lower extremities. Good ROM, no contractures. Normal muscle tone.  Skin: no rashes, lesions, ulcers. No induration Neurologic: CN 2-12 grossly intact. Sensation intact, DTR normal. Strength 5/5 in all 4.  Psychiatric: Normal judgment and insight. Alert and oriented x 3. Normal mood.     Labs on Admission: I have personally reviewed following labs and imaging studies  CBC: Recent Labs  Lab 08/13/22 1030  WBC 10.8*  HGB 13.2  HCT 39.3  MCV 98.5  PLT 161   Basic Metabolic Panel: Recent Labs  Lab 08/13/22 1030  Devin Huffman 138  K 4.1  CL 112*  CO2 19*  GLUCOSE 120*  BUN 17  CREATININE 1.09  CALCIUM  8.4*   GFR: Estimated Creatinine Clearance: 70.2 mL/min (by C-G formula based on SCr of 1.09 mg/dL). Liver Function Tests: No results for input(s): "AST", "ALT", "ALKPHOS", "BILITOT", "PROT", "ALBUMIN" in the last 168 hours. No results for input(s): "LIPASE", "AMYLASE" in the last 168 hours. No results for input(s): "AMMONIA" in the last 168 hours. Coagulation Profile: No results for input(s): "INR", "PROTIME" in the last 168 hours. Cardiac Enzymes: No results for input(s): "CKTOTAL", "CKMB", "CKMBINDEX", "TROPONINI" in the last 168 hours. BNP (last 3 results) No results for input(s): "PROBNP" in the last 8760 hours. HbA1C: No results for input(s): "HGBA1C" in the last 72 hours. CBG: No results for input(s): "GLUCAP" in the last 168 hours. Lipid Profile: No results for input(s): "CHOL", "HDL", "LDLCALC", "TRIG", "CHOLHDL", "LDLDIRECT" in the last 72 hours. Thyroid Function Tests: No results for input(s): "TSH", "T4TOTAL", "FREET4", "T3FREE", "THYROIDAB" in the last 72 hours. Anemia Panel: No results for input(s): "VITAMINB12", "FOLATE", "FERRITIN", "TIBC", "IRON", "RETICCTPCT" in the last 72 hours. Urine analysis:    Component Value Date/Time   COLORURINE YELLOW 03/26/2017 1458   APPEARANCEUR HAZY (A) 03/26/2017 1458   LABSPEC 1.025 03/26/2017 1458   PHURINE 6.0 03/26/2017 1458   GLUCOSEU NEGATIVE 03/26/2017 1458   HGBUR MODERATE (A) 03/26/2017 1458   BILIRUBINUR negative 11/29/2018 0821   KETONESUR NEGATIVE 03/26/2017 1458   PROTEINUR Negative 11/29/2018 0821   PROTEINUR 30 (A) 03/26/2017 1458   UROBILINOGEN 0.2 11/29/2018 0821   UROBILINOGEN 0.2 04/15/2013 0838   NITRITE negative 11/29/2018 0821   NITRITE NEGATIVE 03/26/2017 1458   LEUKOCYTESUR Small (1+) (A) 11/29/2018 0821   Sepsis Labs: !!!!!!!!!!!!!!!!!!!!!!!!!!!!!!!!!!!!!!!!!!!! @LABRCNTIP (procalcitonin:4,lacticidven:4) )No results found for this or any previous visit (from the past 240 hour(s)).   Radiological  Exams on Admission: DG Chest 2 View  Result Date: 08/13/2022 CLINICAL DATA:  Chest pain. Shortness of breath. Lightheadedness when walking. EXAM: CHEST - 2 VIEW COMPARISON:  05/30/2021 FINDINGS: Stable cardiomediastinal contours. There is a new left pleural effusion. Pulmonary vascular congestion is identified on today's study. Mild atelectasis identified in the left base. Visualized osseous structures are unremarkable. IMPRESSION: New left pleural effusion and pulmonary vascular congestion suggestive of CHF. Electronically Signed   By: Kerby Moors M.D.   On: 08/13/2022 11:07     All  images have been reviewed by me personally.  EKG: Independently reviewed.   Assessment/Plan Principal Problem:   Atrial fibrillation with RVR (HCC) Active Problems:   Dyslipidemia associated with type 2 diabetes mellitus ([place])   Essential hypertension   Type 2 diabetes mellitus with other specified complication (HCC)   Obesity, Class I, BMI 30-34.9   Type 2 diabetes mellitus with diabetic neuropathy, unspecified (HCC)   Acute respiratory failure with hypoxia (HCC)   Acute pulmonary edema (HCC)   Pleural effusion   Assessment Plan  # New onset A fib with RVR   - tele monitoring - Trend Troponin, obtain HGB a1C and TSH - electrolytes ok - Echo pending - cardiology consult for consideration of possible TEE and DCCV in next 1-2 days  - Rate control-Cardizem bolus and drip -Anticoagulant-Eliquis   #Acute hypoxic respiratory failure #Acute pulmonary edema and new left pleural effusion  exam and CXR c/w acute pulmonary edema with new left pleural effusion  -Room air O2 sats 87-88% -Telemetry monitoring -BNP elevated -Troponin negative -Hemoglobin A1c and TSH pending -Daily weight and strict intake and output - diuretics- Lasix 40 mg IV daily - Echo pending   # T2DM  -Hemoglobin A1c pending Glucose ACHS - hold home oral DM med   # HTN and HLD  -Chronic and stable - hold home metoprolol and  amlodipine as he is on cardizem drip.    # GERD  -Chronic and stable  # Obesity   Body mass index is 30.52 kg/m. -Weight loss per PCP as outpatient            DVT prophylaxis: Eliquis Code Status: Full code Family Communication: Wife at bedside Consults called: Cardiology Admission status: Inpatient  Status is: Inpatient Remains inpatient appropriate because: patient needs more than 2 night stay.    Time Spent: 65 minutes.  >50% of the time was devoted to discussing the patients care, assessment, plan and disposition with other care givers along with counseling the patient about the risks and benefits of treatment.    Charlann Lange MD [facility]  If 7PM-7AM, please contact night-coverage   08/13/2022, 2:22 PM   For on call review [email or web address].

## 2022-08-13 NOTE — Progress Notes (Signed)
   08/13/22 1527  Vitals  Temp 99 F (37.2 C)  Temp Source Oral  BP (!) 139/94  MAP (mmHg) 109  Pulse Rate (!) 58  ECG Heart Rate (!) 112  Resp (!) 23  MEWS COLOR  MEWS Score Color Yellow  Oxygen Therapy  SpO2 91 %  O2 Device Nasal Cannula  O2 Flow Rate (L/min) 3 L/min  MEWS Score  MEWS Temp 0  MEWS Systolic 0  MEWS Pulse 2  MEWS RR 1  MEWS LOC 0  MEWS Score 3

## 2022-08-13 NOTE — ED Triage Notes (Signed)
Pt bib [facility] from home c/o chest pressure and SOB. Pt in new onset afib rvr with a rate of 150-180s. 20g Lt hands, 324 asa given by Fire.   EMS vitals 136/81 BP 17 RR 150s-180s HR 96% O2 RA 127 CBG

## 2022-08-13 NOTE — Progress Notes (Signed)
Patient arrived to 4e[facility], ccmd called, vitals taken, orders confirmed

## 2022-08-13 NOTE — ED Provider Notes (Signed)
[facility] EMERGENCY DEPARTMENT Provider Note   CSN: [id number] Arrival date & time: 08/13/22  4142     History  No chief complaint on file.   Devin Huffman is a 76 y.o. male.  The history is provided by the patient and medical records. No language interpreter was used.     76 year old male significant history of hypertension, diabetes, hyperlipidemia brought here via EMS for evaluation of chest pressure.  Patient report last night he was sleeping but felt heaviness sensation on his chest.  States it feels like a 10 pound weight on his chest.  Symptoms was waxing and waning throughout the night and he was having difficulty sleeping.  He went outside to walk and also endorsed having some shortness of breath.  His wife took him over to the fire station at that time they gave him.  Return for milligrams of aspirin and EMS was contacted bring patient here.  It was reported that patient was in A-fib with RVR with a heart rate of 150-180.  Patient states he did not have any sensation of lightheadedness and dizziness no heart palpitation no nausea or diaphoresis.  He mention his pain is mostly resolved.  He denies any history of heart attack.  No prior history of PE or DVT.  No prior history of A-fib.  Patient felt fine last night before going to sleep.  Home Medications Prior to Admission medications   Medication Sig Start Date End Date Taking? Authorizing Provider  amLODipine (NORVASC) 10 MG tablet TAKE ONE TABLET BY MOUTH EVERYDAY AT BEDTIME 01/25/22   Ria Bush, MD  Ascorbic Acid (VITAMIN C) 1000 MG tablet Take 1,000 mg by mouth every evening.     [provider]  aspirin EC 81 MG tablet Take 81 mg by mouth every evening.  04/03/17   Ria Bush, MD  blood glucose meter kit and supplies KIT Dispense based on patient and insurance preference. Use to check sugars up to twice daily as directed (E11.69). 06/28/21   Ria Bush, MD  Cholecalciferol  (VITAMIN D3) 50 MCG (2000 UT) capsule Take 50 mcg by mouth every evening.     [provider]  Coenzyme Q10 50 MG CAPS Take 1 capsule (50 mg total) by mouth daily. 06/03/18   Ria Bush, MD  glucose blood (GLUCOSE METER TEST) test strip Use as instructed to check sugars twice daily as needed E11.69 06/28/21   Ria Bush, MD  metFORMIN (GLUCOPHAGE-XR) 750 MG 24 hr tablet Take 2 tablets (1,500 mg total) by mouth daily with breakfast. 02/15/22   Ria Bush, MD  metoprolol succinate (TOPROL-XL) 25 MG 24 hr tablet TAKE ONE TABLET BY MOUTH EVERYDAY AT BEDTIME 04/25/22   Ria Bush, MD  Omega-3 Fatty Acids (FISH OIL) 1000 MG CAPS Take 1 capsule (1,000 mg total) by mouth 2 (two) times a day. 07/10/19   Ria Bush, MD  rosuvastatin (CRESTOR) 5 MG tablet TAKE ONE TABLET BY MOUTH ONCE DAILY 04/25/22   Ria Bush, MD  Semaglutide,0.25 or 0.5MG/DOS, (OZEMPIC, 0.25 OR 0.5 MG/DOSE,) 2 MG/1.5ML SOPN Inject 0.5 mg into the skin once a week. 05/17/22   Ria Bush, MD  vitamin B-12 (CYANOCOBALAMIN) 1000 MCG tablet Take 1,000 mcg by mouth daily.    [provider]  lisinopril (PRINIVIL,ZESTRIL) 5 MG tablet Take 1 tablet (5 mg total) by mouth daily. 10/02/11 10/11/18  Ria Bush, MD      Allergies    Lisinopril    Review of Systems  Review of Systems  All other systems reviewed and are negative.   Physical Exam Updated Vital Signs BP 107/75   Pulse 64   Temp 97.9 F (36.6 C) (Oral)   Resp 15   Ht 6' (1.829 m)   Wt 102.1 kg   SpO2 91%   BMI 30.52 kg/m  Physical Exam Vitals and nursing note reviewed.  Constitutional:      General: He is not in acute distress.    Appearance: He is well-developed.  HENT:     Head: Atraumatic.  Eyes:     Conjunctiva/sclera: Conjunctivae normal.  Cardiovascular:     Rate and Rhythm: Tachycardia present. Rhythm irregular.     Pulses: Normal pulses.     Heart sounds: Normal heart sounds.  Pulmonary:      Effort: Pulmonary effort is normal.     Breath sounds: Rales present. No wheezing or rhonchi.  Abdominal:     Palpations: Abdomen is soft.     Tenderness: There is no abdominal tenderness.  Musculoskeletal:     Cervical back: Neck supple.     Right lower leg: No edema.     Left lower leg: No edema.  Skin:    Findings: No rash.  Neurological:     Mental Status: He is alert. Mental status is at baseline.  Psychiatric:        Mood and Affect: Mood normal.     ED Results / Procedures / Treatments   Labs (all labs ordered are listed, but only abnormal results are displayed) Labs Reviewed  BASIC METABOLIC PANEL - Abnormal; Notable for the following components:      Result Value   Chloride 112 (*)    CO2 19 (*)    Glucose, Bld 120 (*)    Calcium 8.4 (*)    All other components within normal limits  CBC - Abnormal; Notable for the following components:   WBC 10.8 (*)    RBC 3.99 (*)    All other components within normal limits  BRAIN NATRIURETIC PEPTIDE - Abnormal; Notable for the following components:   B Natriuretic Peptide 439.4 (*)    All other components within normal limits  D-DIMER, QUANTITATIVE  TROPONIN I (HIGH SENSITIVITY)  TROPONIN I (HIGH SENSITIVITY)    EKG None ED ECG REPORT   Date: 08/13/2022  Rate: 118  Rhythm: atrial fibrillation  QRS Axis: left  Intervals: normal  ST/T Wave abnormalities: nonspecific ST changes  Conduction Disutrbances:left anterior fascicular block  Narrative Interpretation: anterior Q waves, possible due to LVH  Old EKG Reviewed: changes noted  I have personally reviewed the EKG tracing and agree with the computerized printout as noted.   Radiology DG Chest 2 View  Result Date: 08/13/2022 CLINICAL DATA:  Chest pain. Shortness of breath. Lightheadedness when walking. EXAM: CHEST - 2 VIEW COMPARISON:  05/30/2021 FINDINGS: Stable cardiomediastinal contours. There is a new left pleural effusion. Pulmonary vascular congestion is  identified on today's study. Mild atelectasis identified in the left base. Visualized osseous structures are unremarkable. IMPRESSION: New left pleural effusion and pulmonary vascular congestion suggestive of CHF. Electronically Signed   By: Kerby Moors M.D.   On: 08/13/2022 11:07    Procedures .Critical Care  Performed by: Domenic Moras, PA-C Authorized by: Domenic Moras, PA-C   Critical care provider statement:    Critical care time (minutes):  40   Critical care was time spent personally by me on the following activities:  Development of treatment plan with patient or  surrogate, discussions with consultants, evaluation of patient's response to treatment, examination of patient, ordering and review of laboratory studies, ordering and review of radiographic studies, ordering and performing treatments and interventions, pulse oximetry, re-evaluation of patient's condition and review of old charts     Medications Ordered in ED Medications  furosemide (LASIX) injection 40 mg (has no administration in time range)  diltiazem (CARDIZEM) 1 mg/mL load via infusion 10 mg (has no administration in time range)    And  diltiazem (CARDIZEM) 125 mg in dextrose 5% 125 mL (1 mg/mL) infusion (has no administration in time range)    ED Course/ Medical Decision Making/ A&P Clinical Course as of 08/13/22 1155  Sun Aug 13, 2022  1152 This is a 76 year old male presenting to the ED with complaint of shortness of breath, palpitations, found to be in new onset atrial fibrillation.  He is not certain when the symptoms began, potentially within the past 24 hours, which is when he began having discomfort in his chest, but he reports he has had fatigue for several weeks.  He reports he has been outside working in the heat in the yard more than typical for the past several weeks.  He does not consume alcohol, does not smoke, drinks perhaps 1 soda per day but no other caffeine intake.  No prior history of A-fib.  On exam  the patient appears comfortable, he is afebrile, heart rate is irregular between 101 10 bpm while at rest, blood pressure is normal, his oxygen saturation is between 88 and 90% at rest.  He does have crackles in the lower lobes, some pitting edema of the lower extremity, and x-ray consistent with pulmonary edema.  I suspect this is related from atrial fibrillation.  Given his hypoxia and need for rate control, new onset A-fib, I think is reasonable to initiate anticoagulation and beta-blocker with metoprolol, and also to admit him to the hospital for IV diuresis and possible echocardiogram.  The patient and his wife are in agreement with this plan. [MT]  39 Chadvasc2 score of 3 for age and diabetes and HTN history.  He was unaware of any history of stroke or coronary disease [MT]    Clinical Course User Index [MT] Trifan, Carola Rhine, MD                           Medical Decision Making Amount and/or Complexity of Data Reviewed Labs: ordered. Radiology: ordered.  Risk Prescription drug management.   BP (!) 141/91   Pulse 87   Temp 97.9 F (36.6 C) (Oral)   Resp 19   Ht 6' (1.829 m)   Wt 102.1 kg   SpO2 95%   BMI 30.52 kg/m   10:09 AM This is a 76 year old male with significant history of hypertension, diabetes, high dyslipidemia who presenting with complaints of chest pressure sensation.  Symptoms started since last night, described as a heaviness sensation in his chest causing him difficulty sleeping.  When he got up and walked he also endorsed having some exertional dyspnea.  He went to the fire station and did receive 324 mg of aspirin and since then his symptoms mostly resolved.  He was noted to be in A-fib with RVR with heart rate between 150-180 bpm.  He has never had A-fib before.  Patient denies any cold symptoms no fever productive cough.  He does not endorse any active chest pain except for minimal chest pressure at this time.  On exam this is a well-appearing male appears to  be in no acute discomfort.  He is talkative and resting comfortably.  Currently heart rate is between 92-120 while in the room, EKG shows atrial fibrillation.  11:11 AM Labs, EKG, and imaging obtained independently viewed interpreted by me and I agree with radiologist interpretation.  EKG shows atrial fibrillation.  Chest x-ray demonstrate new left pleural effusion and pulmonary vascular congestion suggestive of CHF.  BNP is currently pending.  D-dimer is negative low suspicion for PE.  However resting heart rate still not out of control, patient states he has been taking all of his medication as prescribed including metoprolol.  We will give diltiazem 10 mg IV.  Due to signs of CHF, I have also ordered for IV Lasix.  Given new onset A-fib, patient given Eliquis.  Plan to consult medicine for admission for IV Lasix and potential echocardiogram.  CAre discussed with Dr. Langston Masker  12:05 PM Appreciate consultation from [facility] hospitalist, Dr. Nicoletta Dress, who request patient to be placed on a Cardizem drip and she will be willing  to admit patient for further care  This patient presents to the ED for concern of sob, this involves an extensive number of treatment options, and is a complaint that carries with it a high risk of complications and morbidity.  The differential diagnosis includes chf, copd, pe, ptx, pna, acs  Co morbidities that complicate the patient evaluation htn  Hld  dm   Additional history obtained:  Additional history obtained from wife External records from outside source obtained and reviewed including EMR and prior labs and imaging  Lab Tests:  I Ordered, and personally interpreted labs.  The pertinent results include:  as above  Imaging Studies ordered:  I ordered imaging studies including CXR I independently visualized and interpreted imaging which showed CHF I agree with the radiologist interpretation  Cardiac Monitoring:  The patient was maintained on a cardiac monitor.  I  personally viewed and interpreted the cardiac monitored which showed an underlying rhythm of: afib with RVR  Medicines ordered and prescription drug management:  I ordered medication including lasix, diltiazem, eliquis  for chf and new afib Reevaluation of the patient after these medicines showed that the patient improved I have reviewed the patients home medicines and have made adjustments as needed  Test Considered: as above  Critical Interventions: DOAC  Diltiazem  diuretic  Consultations Obtained:  I requested consultation with the hospitalist Dr. Nicoletta Dress,  and discussed lab and imaging findings as well as pertinent plan - they recommend: admission  Problem List / ED Course: new onset afib  CHF  hypoxia  Reevaluation:  After the interventions noted above, I reevaluated the patient and found that they have :improved  Social Determinants of Health: lack of physical activity  Dispostion:  After consideration of the diagnostic results and the patients response to treatment, I feel that the patent would benefit from admission.         Final Clinical Impression(s) / ED Diagnoses Final diagnoses:  New onset atrial fibrillation ([place])  Acute congestive heart failure, unspecified heart failure type [facility])    Rx / DC Orders ED Discharge Orders     None         Domenic Moras, PA-C 08/13/22 1227    Wyvonnia Dusky, MD 08/13/22 1407

## 2022-08-13 NOTE — Progress Notes (Signed)
ANTICOAGULATION CONSULT NOTE - Initial Consult  Pharmacy Consult for Apixaban Indication: atrial fibrillation  Allergies  Allergen Reactions   Lisinopril Hives and Swelling    ?angioedema - lip swelling, hives    Patient Measurements: Height: 6' (182.9 cm) Weight: 102.1 kg (225 lb) IBW/kg (Calculated) : 77.6  Vital Signs: Temp: 97.9 F (36.6 C) (09/03 1002) Temp Source: Oral (09/03 1002) BP: 129/80 (09/03 1145) Pulse Rate: 52 (09/03 1145)  Labs: Recent Labs    08/13/22 1030  HGB 13.2  HCT 39.3  PLT 201  CREATININE 1.09  TROPONINIHS 4    Estimated Creatinine Clearance: 70.2 mL/min (by C-G formula based on SCr of 1.09 mg/dL).   Medical History: Past Medical History:  Diagnosis Date   Cancer ([place])    skin cancer removed from nose   Cerumen impaction 2016   bilateral with otitis externa s/p ENT removal - [facility]   Diabetes mellitus without complication ([facility])    GERD (gastroesophageal reflux disease)    occasional   Gout    ?due to L great toe pain, saw Dr Gladstone Lighter   History of chicken pox    Hyperlipidemia    Hypertension    Legg-Perthes disease    Right hip   Nasal septal deviation    monitoring - [facility]   Pneumonia due to COVID-19 virus 01/21/2020   Prediabetes 03/22/2009   Sepsis ([place])    In March 2018    Medications:  (Not in a hospital admission)   Assessment: 76 yo M presented with chest pain and was found to be in Afib with RVR. Patient was not taking any anticoagulants prior to admission. Pt was taking aspirin '81mg'$  po every evening. Pharmacy was consulted to initiate apixaban for nonvalvular AFib.  Goal of Therapy:  Monitor platelets by anticoagulation protocol: Yes   Plan:  Start apixaban '5mg'$  by mouth twice daily Monitor daily CBC and for s/sx of bleeding  Luisa Hart, PharmD, BCPS Clinical Pharmacist 08/13/2022 12:43 PM   Please refer to [facility] for pharmacy phone number

## 2022-08-14 ENCOUNTER — Inpatient Hospital Stay (HOSPITAL_COMMUNITY): Payer: Medicare HMO

## 2022-08-14 DIAGNOSIS — I4891 Unspecified atrial fibrillation: Secondary | ICD-10-CM

## 2022-08-14 LAB — COMPREHENSIVE METABOLIC PANEL
ALT: 16 U/L (ref 0–44)
AST: 13 U/L — ABNORMAL LOW (ref 15–41)
Albumin: 3.4 g/dL — ABNORMAL LOW (ref 3.5–5.0)
Alkaline Phosphatase: 48 U/L (ref 38–126)
Anion gap: 10 (ref 5–15)
BUN: 18 mg/dL (ref 8–23)
CO2: 23 mmol/L (ref 22–32)
Calcium: 9.2 mg/dL (ref 8.9–10.3)
Chloride: 109 mmol/L (ref 98–111)
Creatinine, Ser: 1.2 mg/dL (ref 0.61–1.24)
GFR, Estimated: >60 mL/min (ref >60)
Glucose, Bld: 123 mg/dL — ABNORMAL HIGH (ref 70–99)
Potassium: 4.5 mmol/L (ref 3.5–5.1)
Sodium: 142 mmol/L (ref 135–145)
Total Bilirubin: 0.9 mg/dL (ref 0.3–1.2)
Total Protein: 6.1 g/dL — ABNORMAL LOW (ref 6.5–8.1)

## 2022-08-14 LAB — CBC
HCT: 40.3 % (ref 39.0–52.0)
Hemoglobin: 13.6 g/dL (ref 13.0–17.0)
MCH: 33 pg (ref 26.0–34.0)
MCHC: 33.7 g/dL (ref 30.0–36.0)
MCV: 97.8 fL (ref 80.0–100.0)
Platelets: 223 10*3/uL (ref 150–400)
RBC: 4.12 MIL/uL — ABNORMAL LOW (ref 4.22–5.81)
RDW: 12.4 % (ref 11.5–15.5)
WBC: 12.3 10*3/uL — ABNORMAL HIGH (ref 4.0–10.5)
nRBC: 0 % (ref 0.0–0.2)

## 2022-08-14 LAB — TSH: TSH: 3.429 u[IU]/mL (ref 0.350–4.500)

## 2022-08-14 LAB — GLUCOSE, CAPILLARY
Glucose-Capillary: 131 mg/dL — ABNORMAL HIGH (ref 70–99)
Glucose-Capillary: 207 mg/dL — ABNORMAL HIGH (ref 70–99)
Glucose-Capillary: 124 mg/dL — ABNORMAL HIGH (ref 70–99)
Glucose-Capillary: 105 mg/dL — ABNORMAL HIGH (ref 70–99)

## 2022-08-14 LAB — ECHOCARDIOGRAM COMPLETE
Height: 72 in
S' Lateral: 3.6 cm
Weight: 3633.6 oz

## 2022-08-14 LAB — HEMOGLOBIN A1C
Hgb A1c MFr Bld: 6.3 % — ABNORMAL HIGH (ref 4.8–5.6)
Mean Plasma Glucose: 134.11 mg/dL

## 2022-08-14 MED ORDER — METFORMIN HCL 500 MG PO TABS
1000.0000 mg | ORAL_TABLET | Freq: Two times a day (BID) | ORAL | Status: DC
  Administered 2022-08-14 – 2022-08-16 (×4): 1000 mg via ORAL
  Filled 2022-08-14 (×4): qty 2

## 2022-08-14 NOTE — Progress Notes (Signed)
  Echocardiogram 2D Echocardiogram has been performed.  Devin Huffman 08/14/2022, 9:04 AM

## 2022-08-14 NOTE — Progress Notes (Signed)
Rounding Note    Patient Name: Devin Huffman Date of Encounter: 08/14/2022  [place] Cardiologist: None   Subjective   Patient seen and examined at his bedside.  Inpatient Medications    Scheduled Meds:  apixaban  5 mg Oral BID   furosemide  40 mg Intravenous Daily   metoprolol tartrate  25 mg Oral BID   rosuvastatin  5 mg Oral QHS   Continuous Infusions:  diltiazem (CARDIZEM) infusion 10 mg/hr (08/14/22 0156)   PRN Meds: acetaminophen **OR** acetaminophen, morphine injection   Vital Signs    Vitals:   08/14/22 0342 08/14/22 0641 08/14/22 0739 08/14/22 0917  BP: 139/81  116/66 101/66  Pulse: 78  76 80  Resp: 20  18   Temp: 98.7 F (37.1 C)  97.7 F (36.5 C)   TempSrc: Oral  Oral   SpO2: 96%  93%   Weight:  103 kg    Height:        Intake/Output Summary (Last 24 hours) at 08/14/2022 1002 Last data filed at 08/14/2022 7829 Gross per 24 hour  Intake 282.65 ml  Output 200 ml  Net 82.65 ml      08/14/2022    6:41 AM 08/13/2022   10:05 AM 07/31/2022    1:15 PM  Last 3 Weights  Weight (lbs) 227 lb 1.6 oz 225 lb 232 lb  Weight (kg) 103.012 kg 102.059 kg 105.235 kg      Telemetry    afib - Personally Reviewed  ECG    None today - Personally Reviewed  Physical Exam   GEN: No acute distress.   Neck: No JVD Cardiac: RRR, no murmurs, rubs, or gallops.  Respiratory: Bibasilar crackles. GI: Soft, nontender, non-distended  MS: +2 bilateral lower extremity edema; No deformity. Neuro:  Nonfocal  Psych: Normal affect   Labs    High Sensitivity Troponin:   Recent Labs  Lab 08/13/22 1030 08/13/22 1210  TROPONINIHS 4 3     Chemistry Recent Labs  Lab 08/13/22 1030 08/14/22 0143  NA 138 142  K 4.1 4.5  CL 112* 109  CO2 19* 23  GLUCOSE 120* 123*  BUN 17 18  CREATININE 1.09 1.20  CALCIUM 8.4* 9.2  PROT  --  6.1*  ALBUMIN  --  3.4*  AST  --  13*  ALT  --  16  ALKPHOS  --  48  BILITOT  --  0.9  GFRNONAA >60 >60  ANIONGAP 7 10     Lipids No results for input(s): "CHOL", "TRIG", "HDL", "LABVLDL", "LDLCALC", "CHOLHDL" in the last 168 hours.  Hematology Recent Labs  Lab 08/13/22 1030 08/14/22 0143  WBC 10.8* 12.3*  RBC 3.99* 4.12*  HGB 13.2 13.6  HCT 39.3 40.3  MCV 98.5 97.8  MCH 33.1 33.0  MCHC 33.6 33.7  RDW 12.3 12.4  PLT 201 223   Thyroid  Recent Labs  Lab 08/14/22 0143  TSH 3.429    BNP Recent Labs  Lab 08/13/22 1030  BNP 439.4*    DDimer  Recent Labs  Lab 08/13/22 1030  DDIMER 0.34     Radiology    DG Chest 2 View  Result Date: 08/13/2022 CLINICAL DATA:  Chest pain. Shortness of breath. Lightheadedness when walking. EXAM: CHEST - 2 VIEW COMPARISON:  05/30/2021 FINDINGS: Stable cardiomediastinal contours. There is a new left pleural effusion. Pulmonary vascular congestion is identified on today's study. Mild atelectasis identified in the left base. Visualized osseous structures are unremarkable. IMPRESSION: New  left pleural effusion and pulmonary vascular congestion suggestive of CHF. Electronically Signed   By: Kerby Moors M.D.   On: 08/13/2022 11:07    Cardiac Studies   TTE 2018 Study Conclusions   - Left ventricle: The cavity size was normal. There was mild    concentric hypertrophy. Systolic function was normal. The    estimated ejection fraction was in the range of 55% to 60%. Wall    motion was normal; there were no regional wall motion    abnormalities. Doppler parameters are consistent with abnormal    left ventricular relaxation (grade 1 diastolic dysfunction).  - Aortic root: The aortic root was mildly dilated.  - Left atrium: The atrium was mildly dilated.   Patient Profile     76 y.o. male with a hx of DM, HTN and dyslipidemia admitted for acute exacerbation of heart failure as well as A-fib with rapid ventricular rate.  Assessment & Plan    Acute exacerbation of heart failure -likely stay appears to be volume overloaded.  He can still benefit from his IV Lasix  please continue.  Previous EF in 2018 normal echo pending today.  Atrial fibrillation with rapid ventricular rate-suspect his rapid ventricular rate is also driving by the fact that he is in heart failure.  His heart rate has improved but still intermittently goes up into the low 100s.  Expect this to improve once he is diuresed effectively.  Continue the Cardizem drip as well as his beta-blocker for now.  HTN-blood pressure is marginal.  We will continue to monitor.  DM-this is a managed by the primary team.  Chest pain-no chest pain at this time.  No need for any further ischemic evaluation unless his EF has dropped significantly and needs to be investigated for ischemic work-up.  Discussed with the patient and his wife by the bedside-questions were answered to their satisfaction.    For questions or updates, please contact [facility] Please consult [email or web address] for contact info under       Signed, Nabeel Gladson, DO  08/14/2022, 10:02 AM

## 2022-08-14 NOTE — Plan of Care (Signed)

## 2022-08-14 NOTE — Progress Notes (Signed)
PROGRESS NOTE    JEFFRE ENRIQUES  UUV:[id number] DOB: [birth year] DOA: 08/13/2022 PCP: Ria Bush, MD  Outpatient Specialists:     Brief Narrative:  Patient is a 76 year old Caucasian male past medical history significant for hypertension, hyperlipidemia, type 2 diabetes mellitus and GERD.  Patient presented with chest pressures.  On presentation, patient was found to be in A-fib with rapid ventricular response.  Work-up done so far has also revealed acute congestive heart failure with preserved ejection fraction.  Patient is currently being diuresed.  Patient is on Cardizem and metoprolol.  Cardiology team has been consulted.  TSH is within normal range.  D-dimer is negative.  Echocardiogram revealed preserved ejection fraction, LVH, indeterminate diastolic dysfunction and mildly elevated pulmonary artery systolic pressure.  08/14/2022: Heart rate is controlled.  Chest pressure has resolved.  Edema is improving.  Blood pressure is better controlled.  Cardiology team is directing care.  Patient remains on IV diuretics and cardizem drip.  Assessment & Plan:   Principal Problem:   Atrial fibrillation with RVR (HCC) Active Problems:   Dyslipidemia associated with type 2 diabetes mellitus ([place])   Essential hypertension   Type 2 diabetes mellitus with other specified complication (HCC)   Obesity, Class I, BMI 30-34.9   Type 2 diabetes mellitus with diabetic neuropathy, unspecified (HCC)   Acute respiratory failure with hypoxia (HCC)   Acute pulmonary edema (HCC)   Pleural effusion   New onset A fib with RVR: -Continue Cardizem drip. -Continue beta-blocker. -Heart rate is controlled. -Cardiology input is appreciated. -Cardiac catheterization is not planned. -Echocardiogram revealed preserved EF. -TSH is normal. -D-dimer is negative. -Patient is on anticoagulation. -Discharge home once cleared for discharge by the cardiology team.  Acute CHF with preserved EF: -Continue  diuretics. -Continue beta-blocker. -Patient will eventually need Entresto and SGLT2 inhibitor.  Chest pressure: -Resolved. -Likely secondary to A-fib with RVR.  Diabetes mellitus type 2: -Continue to optimize. -We will restart metformin  Hypertension: -Goal blood pressure should be less than 130/80 mmHg.  DVT prophylaxis: Patient is on Eliquis. Code Status: Full code. Family Communication: Wife Disposition Plan: Home eventually   Consultants:  Radiology  Procedures:  Echocardiogram  Antimicrobials:  None   Subjective: No chest pressure No shortness of breath Palpitations  Objective: Vitals:   08/14/22 0342 08/14/22 0641 08/14/22 0739 08/14/22 0917  BP: 139/81  116/66 101/66  Pulse: 78  76 80  Resp: 20  18   Temp: 98.7 F (37.1 C)  97.7 F (36.5 C)   TempSrc: Oral  Oral   SpO2: 96%  93%   Weight:  103 kg    Height:        Intake/Output Summary (Last 24 hours) at 08/14/2022 1211 Last data filed at 08/14/2022 4742 Gross per 24 hour  Intake 282.65 ml  Output 200 ml  Net 82.65 ml   Filed Weights   08/13/22 1005 08/14/22 0641  Weight: 102.1 kg 103 kg    Examination:  General exam: Appears calm and comfortable  Respiratory system: Clear to auscultation.  Cardiovascular system: S1 & S2, irregularly irregular  Gastrointestinal system: Abdomen is soft and nontender. Central nervous system: Alert and oriented.  Patient moves all extremities.  Data Reviewed: I have personally reviewed following labs and imaging studies  CBC: Recent Labs  Lab 08/13/22 1030 08/14/22 0143  WBC 10.8* 12.3*  HGB 13.2 13.6  HCT 39.3 40.3  MCV 98.5 97.8  PLT 201 595   Basic Metabolic Panel: Recent Labs  Lab 08/13/22  1030 08/14/22 0143  NA 138 142  K 4.1 4.5  CL 112* 109  CO2 19* 23  GLUCOSE 120* 123*  BUN 17 18  CREATININE 1.09 1.20  CALCIUM 8.4* 9.2   GFR: Estimated Creatinine Clearance: 64 mL/min (by C-G formula based on SCr of 1.2 mg/dL). Liver Function  Tests: Recent Labs  Lab 08/14/22 0143  AST 13*  ALT 16  ALKPHOS 48  BILITOT 0.9  PROT 6.1*  ALBUMIN 3.4*   No results for input(s): "LIPASE", "AMYLASE" in the last 168 hours. No results for input(s): "AMMONIA" in the last 168 hours. Coagulation Profile: No results for input(s): "INR", "PROTIME" in the last 168 hours. Cardiac Enzymes: No results for input(s): "CKTOTAL", "CKMB", "CKMBINDEX", "TROPONINI" in the last 168 hours. BNP (last 3 results) No results for input(s): "PROBNP" in the last 8760 hours. HbA1C: Recent Labs    08/14/22 0143  HGBA1C 6.3*   CBG: Recent Labs  Lab 08/13/22 2148 08/14/22 0617 08/14/22 1105  GLUCAP 120* 131* 207*   Lipid Profile: No results for input(s): "CHOL", "HDL", "LDLCALC", "TRIG", "CHOLHDL", "LDLDIRECT" in the last 72 hours. Thyroid Function Tests: Recent Labs    08/14/22 0143  TSH 3.429   Anemia Panel: No results for input(s): "VITAMINB12", "FOLATE", "FERRITIN", "TIBC", "IRON", "RETICCTPCT" in the last 72 hours. Urine analysis:    Component Value Date/Time   COLORURINE YELLOW 03/26/2017 1458   APPEARANCEUR HAZY (A) 03/26/2017 1458   LABSPEC 1.025 03/26/2017 1458   PHURINE 6.0 03/26/2017 1458   GLUCOSEU NEGATIVE 03/26/2017 1458   HGBUR MODERATE (A) 03/26/2017 1458   BILIRUBINUR negative 11/29/2018 0821   KETONESUR NEGATIVE 03/26/2017 1458   PROTEINUR Negative 11/29/2018 0821   PROTEINUR 30 (A) 03/26/2017 1458   UROBILINOGEN 0.2 11/29/2018 0821   UROBILINOGEN 0.2 04/15/2013 0838   NITRITE negative 11/29/2018 0821   NITRITE NEGATIVE 03/26/2017 1458   LEUKOCYTESUR Small (1+) (A) 11/29/2018 0821   Sepsis Labs: '@LABRCNTIP'$ (procalcitonin:4,lacticidven:4)  )No results found for this or any previous visit (from the past 240 hour(s)).       Radiology Studies: ECHOCARDIOGRAM COMPLETE  Result Date: 08/14/2022    ECHOCARDIOGRAM REPORT   Patient Name:   REMI LOPATA Date of Exam: 08/14/2022 Medical Rec #:  [id number]       Height:       72.0 in Accession #:    [id number]     Weight:       227.1 lb Date of Birth:  1946-09-14     BSA:          2.249 m Patient Age:    23 years       BP:           116/66 mmHg Patient Gender: M              HR:           87 bpm. Exam Location:  Inpatient Procedure: 2D Echo Indications:    Atrial fibrillation  History:        Patient has prior history of Echocardiogram examinations, most                 recent 03/01/2017. Risk Factors:Dyslipidemia, Hypertension and                 Diabetes.  Sonographer:    Johny Chess RDCS Referring Phys: [phone] NA LI IMPRESSIONS  1. Left ventricular ejection fraction, by estimation, is 50 to 55%. The left ventricle has low normal function. Left ventricular endocardial  border not optimally defined to evaluate regional wall motion. There is mild left ventricular hypertrophy. Left ventricular diastolic parameters are indeterminate.  2. Right ventricular systolic function is mildly reduced. The right ventricular size is mildly enlarged. There is mildly elevated pulmonary artery systolic pressure. The estimated right ventricular systolic pressure is 47.6 mmHg.  3. The mitral valve is grossly normal. Trivial mitral valve regurgitation. No evidence of mitral stenosis.  4. The aortic valve is grossly normal. There is mild calcification of the aortic valve. Aortic valve regurgitation is not visualized. No aortic stenosis is present.  5. The inferior vena cava is dilated in size with >50% respiratory variability, suggesting right atrial pressure of 8 mmHg. FINDINGS  Left Ventricle: Left ventricular ejection fraction, by estimation, is 50 to 55%. The left ventricle has low normal function. Left ventricular endocardial border not optimally defined to evaluate regional wall motion. The left ventricular internal cavity  size was normal in size. There is mild left ventricular hypertrophy. Left ventricular diastolic parameters are indeterminate. Right Ventricle: The right ventricular  size is mildly enlarged. No increase in right ventricular wall thickness. Right ventricular systolic function is mildly reduced. There is mildly elevated pulmonary artery systolic pressure. The tricuspid regurgitant  velocity is 2.63 m/s, and with an assumed right atrial pressure of 10 mmHg, the estimated right ventricular systolic pressure is 54.6 mmHg. Left Atrium: Left atrial size was normal in size. Right Atrium: Right atrial size was normal in size. Pericardium: There is no evidence of pericardial effusion. Mitral Valve: The mitral valve is grossly normal. Trivial mitral valve regurgitation. No evidence of mitral valve stenosis. Tricuspid Valve: The tricuspid valve is normal in structure. Tricuspid valve regurgitation is trivial. No evidence of tricuspid stenosis. Aortic Valve: The aortic valve is grossly normal. There is mild calcification of the aortic valve. Aortic valve regurgitation is not visualized. No aortic stenosis is present. Pulmonic Valve: The pulmonic valve was normal in structure. Pulmonic valve regurgitation is trivial. No evidence of pulmonic stenosis. Aorta: The aortic root is normal in size and structure. Venous: The inferior vena cava is dilated in size with greater than 50% respiratory variability, suggesting right atrial pressure of 8 mmHg. IAS/Shunts: No atrial level shunt detected by color flow Doppler.  LEFT VENTRICLE PLAX 2D LVIDd:         5.20 cm LVIDs:         3.60 cm LV PW:         1.10 cm LV IVS:        1.20 cm LVOT diam:     1.90 cm LVOT Area:     2.84 cm  IVC IVC diam: 2.40 cm LEFT ATRIUM             Index        RIGHT ATRIUM           Index LA diam:        4.30 cm 1.91 cm/m   RA Area:     17.90 cm LA Vol (A2C):   52.0 ml 23.12 ml/m  RA Volume:   49.50 ml  22.01 ml/m LA Vol (A4C):   60.1 ml 26.73 ml/m LA Biplane Vol: 58.9 ml 26.19 ml/m   AORTA Ao Root diam: 3.50 cm Ao Asc diam:  3.60 cm TRICUSPID VALVE TR Peak grad:   27.7 mmHg TR Vmax:        263.00 cm/s  SHUNTS Systemic  Diam: 1.90 cm Cherlynn Kaiser MD Electronically signed by Cherlynn Kaiser MD  Signature Date/Time: 08/14/2022/11:07:56 AM    Final    DG Chest 2 View  Result Date: 08/13/2022 CLINICAL DATA:  Chest pain. Shortness of breath. Lightheadedness when walking. EXAM: CHEST - 2 VIEW COMPARISON:  05/30/2021 FINDINGS: Stable cardiomediastinal contours. There is a new left pleural effusion. Pulmonary vascular congestion is identified on today's study. Mild atelectasis identified in the left base. Visualized osseous structures are unremarkable. IMPRESSION: New left pleural effusion and pulmonary vascular congestion suggestive of CHF. Electronically Signed   By: Kerby Moors M.D.   On: 08/13/2022 11:07        Scheduled Meds:  apixaban  5 mg Oral BID   furosemide  40 mg Intravenous Daily   metoprolol tartrate  25 mg Oral BID   rosuvastatin  5 mg Oral QHS   Continuous Infusions:  diltiazem (CARDIZEM) infusion 10 mg/hr (08/14/22 0156)     LOS: 1 day    Time spent: 55 minutes    Dana Allan, MD  [facility] Pager #: [phone] 7PM-7AM contact night coverage as above

## 2022-08-15 ENCOUNTER — Other Ambulatory Visit (HOSPITAL_COMMUNITY): Payer: Self-pay

## 2022-08-15 DIAGNOSIS — J810 Acute pulmonary edema: Secondary | ICD-10-CM | POA: Diagnosis not present

## 2022-08-15 DIAGNOSIS — I4891 Unspecified atrial fibrillation: Secondary | ICD-10-CM | POA: Diagnosis not present

## 2022-08-15 DIAGNOSIS — I5031 Acute diastolic (congestive) heart failure: Secondary | ICD-10-CM

## 2022-08-15 DIAGNOSIS — I10 Essential (primary) hypertension: Secondary | ICD-10-CM | POA: Diagnosis not present

## 2022-08-15 DIAGNOSIS — E1169 Type 2 diabetes mellitus with other specified complication: Secondary | ICD-10-CM | POA: Diagnosis not present

## 2022-08-15 LAB — RENAL FUNCTION PANEL
Albumin: 3.3 g/dL — ABNORMAL LOW (ref 3.5–5.0)
Anion gap: 10 (ref 5–15)
BUN: 16 mg/dL (ref 8–23)
CO2: 21 mmol/L — ABNORMAL LOW (ref 22–32)
Calcium: 8.8 mg/dL — ABNORMAL LOW (ref 8.9–10.3)
Chloride: 108 mmol/L (ref 98–111)
Creatinine, Ser: 1.15 mg/dL (ref 0.61–1.24)
GFR, Estimated: >60 mL/min (ref >60)
Glucose, Bld: 163 mg/dL — ABNORMAL HIGH (ref 70–99)
Phosphorus: 3.2 mg/dL (ref 2.5–4.6)
Potassium: 4.1 mmol/L (ref 3.5–5.1)
Sodium: 139 mmol/L (ref 135–145)

## 2022-08-15 LAB — GLUCOSE, CAPILLARY
Glucose-Capillary: 128 mg/dL — ABNORMAL HIGH (ref 70–99)
Glucose-Capillary: 117 mg/dL — ABNORMAL HIGH (ref 70–99)
Glucose-Capillary: 139 mg/dL — ABNORMAL HIGH (ref 70–99)
Glucose-Capillary: 98 mg/dL (ref 70–99)

## 2022-08-15 LAB — MAGNESIUM: Magnesium: 1.8 mg/dL (ref 1.7–2.4)

## 2022-08-15 MED ORDER — FUROSEMIDE 10 MG/ML IJ SOLN: 40.0000 mg | Freq: Two times a day (BID) | INTRAMUSCULAR | Status: DC

## 2022-08-15 MED ORDER — DILTIAZEM HCL ER COATED BEADS 120 MG PO CP24
240.0000 mg | ORAL_CAPSULE | Freq: Every day | ORAL | Status: DC
  Administered 2022-08-15 – 2022-08-16 (×2): 240 mg via ORAL
  Filled 2022-08-15 (×2): qty 2

## 2022-08-15 MED ORDER — FUROSEMIDE 10 MG/ML IJ SOLN
40.0000 mg | Freq: Two times a day (BID) | INTRAMUSCULAR | Status: AC
Start: 2022-08-15 — End: 2022-08-15
  Administered 2022-08-15: 40 mg via INTRAVENOUS
  Filled 2022-08-15: qty 4

## 2022-08-15 NOTE — Progress Notes (Signed)
PROGRESS NOTE    Devin Huffman  NTI:[id number] DOB: 21-Jun-1946 DOA: 08/13/2022 PCP: Ria Bush, MD  Outpatient Specialists:     Brief Narrative:  Patient is a 76 year old Caucasian male past medical history significant for hypertension, hyperlipidemia, type 2 diabetes mellitus and GERD.  Patient presented with chest pressures.  On presentation, patient was found to be in A-fib with rapid ventricular response.  Work-up done so far has also revealed acute congestive heart failure with preserved ejection fraction.  Patient is currently being diuresed.  Patient is on Cardizem and metoprolol.  Cardiology team has been consulted.  TSH is within normal range.  D-dimer is negative.  Echocardiogram revealed preserved ejection fraction, LVH, indeterminate diastolic dysfunction and mildly elevated pulmonary artery systolic pressure.  08/14/2022: Heart rate is controlled.  Chest pressure has resolved.  Edema is improving.  Blood pressure is better controlled.  Cardiology team is directing care.  Patient remains on IV diuretics and cardizem drip.  08/15/2022: Patient seen alongside patient's wife and granddaughter.  Patient continues to improve.  Patient is currently on IV Lasix 40 Mg twice daily, Cardizem CD 240 mg once daily, apixaban 5 Mg p.o. twice daily and Lopressor 25 Mg p.o. twice daily.  Cardiology team is directing care.  Assessment & Plan:   Principal Problem:   Atrial fibrillation with RVR (HCC) Active Problems:   Dyslipidemia associated with type 2 diabetes mellitus ([place])   Essential hypertension   Type 2 diabetes mellitus with other specified complication (HCC)   Obesity, Class I, BMI 30-34.9   Type 2 diabetes mellitus with diabetic neuropathy, unspecified (HCC)   Acute respiratory failure with hypoxia (HCC)   Acute pulmonary edema (HCC)   Pleural effusion   New onset A fib with RVR: -Patient is now on oral Cardizem. -Apixaban 5 Mg p.o. twice daily. -Metoprolol tartrate 25 Mg  p.o. twice daily. -Echocardiogram revealed normal ejection fraction. -Cardiac catheterization is not planned. -TSH is normal. -D-dimer is negative. -Discharge home once cleared for discharge by the cardiology team.  Acute CHF with preserved EF: -Continue diuretics. -Continue beta-blocker. -Patient will eventually need Entresto and SGLT2 inhibitor.  Chest pressure: -Resolved. -Likely secondary to A-fib with RVR.  Diabetes mellitus type 2: -Continue to optimize. -We will restart metformin  Hypertension: -Goal blood pressure should be less than 130/80 mmHg.  DVT prophylaxis: Patient is on Eliquis. Code Status: Full code. Family Communication: Wife Disposition Plan: Home eventually   Consultants:  Radiology  Procedures:  Echocardiogram  Antimicrobials:  None   Subjective: No chest pressure No shortness of breath Palpitations  Objective: Vitals:   08/15/22 0415 08/15/22 0806 08/15/22 1000 08/15/22 1200  BP: 114/67 115/73    Pulse: 85 67    Resp: 18 20    Temp: 98 F (36.7 C) 99 F (37.2 C)    TempSrc: Oral Oral    SpO2: 92% 94% 96% 92%  Weight: 102.4 kg     Height:        Intake/Output Summary (Last 24 hours) at 08/15/2022 1508 Last data filed at 08/15/2022 8676 Gross per 24 hour  Intake 360 ml  Output 1050 ml  Net -690 ml    Filed Weights   08/13/22 1005 08/14/22 0641 08/15/22 0415  Weight: 102.1 kg 103 kg 102.4 kg    Examination:  General exam: Appears calm and comfortable  Respiratory system: Clear to auscultation.  Cardiovascular system: S1 & S2, irregularly irregular  Gastrointestinal system: Abdomen is soft and nontender. Central nervous system: Alert and  oriented.  Patient moves all extremities.  Data Reviewed: I have personally reviewed following labs and imaging studies  CBC: Recent Labs  Lab 08/13/22 1030 08/14/22 0143  WBC 10.8* 12.3*  HGB 13.2 13.6  HCT 39.3 40.3  MCV 98.5 97.8  PLT 201 503    Basic Metabolic  Panel: Recent Labs  Lab 08/13/22 1030 08/14/22 0143 08/15/22 0746  NA 138 142 139  K 4.1 4.5 4.1  CL 112* 109 108  CO2 19* 23 21*  GLUCOSE 120* 123* 163*  BUN '17 18 16  '$ CREATININE 1.09 1.20 1.15  CALCIUM 8.4* 9.2 8.8*  MG  --   --  1.8  PHOS  --   --  3.2    GFR: Estimated Creatinine Clearance: 66.6 mL/min (by C-G formula based on SCr of 1.15 mg/dL). Liver Function Tests: Recent Labs  Lab 08/14/22 0143 08/15/22 0746  AST 13*  --   ALT 16  --   ALKPHOS 48  --   BILITOT 0.9  --   PROT 6.1*  --   ALBUMIN 3.4* 3.3*    No results for input(s): "LIPASE", "AMYLASE" in the last 168 hours. No results for input(s): "AMMONIA" in the last 168 hours. Coagulation Profile: No results for input(s): "INR", "PROTIME" in the last 168 hours. Cardiac Enzymes: No results for input(s): "CKTOTAL", "CKMB", "CKMBINDEX", "TROPONINI" in the last 168 hours. BNP (last 3 results) No results for input(s): "PROBNP" in the last 8760 hours. HbA1C: Recent Labs    08/14/22 0143  HGBA1C 6.3*    CBG: Recent Labs  Lab 08/14/22 1105 08/14/22 1559 08/14/22 2107 08/15/22 0646 08/15/22 1214  GLUCAP 207* 124* 105* 128* 117*    Lipid Profile: No results for input(s): "CHOL", "HDL", "LDLCALC", "TRIG", "CHOLHDL", "LDLDIRECT" in the last 72 hours. Thyroid Function Tests: Recent Labs    08/14/22 0143  TSH 3.429    Anemia Panel: No results for input(s): "VITAMINB12", "FOLATE", "FERRITIN", "TIBC", "IRON", "RETICCTPCT" in the last 72 hours. Urine analysis:    Component Value Date/Time   COLORURINE YELLOW 03/26/2017 1458   APPEARANCEUR HAZY (A) 03/26/2017 1458   LABSPEC 1.025 03/26/2017 1458   PHURINE 6.0 03/26/2017 1458   GLUCOSEU NEGATIVE 03/26/2017 1458   HGBUR MODERATE (A) 03/26/2017 1458   BILIRUBINUR negative 11/29/2018 0821   KETONESUR NEGATIVE 03/26/2017 1458   PROTEINUR Negative 11/29/2018 0821   PROTEINUR 30 (A) 03/26/2017 1458   UROBILINOGEN 0.2 11/29/2018 0821   UROBILINOGEN  0.2 04/15/2013 0838   NITRITE negative 11/29/2018 0821   NITRITE NEGATIVE 03/26/2017 1458   LEUKOCYTESUR Small (1+) (A) 11/29/2018 0821   Sepsis Labs: '@LABRCNTIP'$ (procalcitonin:4,lacticidven:4)  )No results found for this or any previous visit (from the past 240 hour(s)).       Radiology Studies: ECHOCARDIOGRAM COMPLETE  Result Date: 08/14/2022    ECHOCARDIOGRAM REPORT   Patient Name:   Devin Huffman Date of Exam: 08/14/2022 Medical Rec #:  [id number]      Height:       72.0 in Accession #:    [id number]     Weight:       227.1 lb Date of Birth:  1946/05/11     BSA:          2.249 m Patient Age:    31 years       BP:           116/66 mmHg Patient Gender: M              HR:  87 bpm. Exam Location:  Inpatient Procedure: 2D Echo Indications:    Atrial fibrillation  History:        Patient has prior history of Echocardiogram examinations, most                 recent 03/01/2017. Risk Factors:Dyslipidemia, Hypertension and                 Diabetes.  Sonographer:    Johny Chess RDCS Referring Phys: [phone] NA LI IMPRESSIONS  1. Left ventricular ejection fraction, by estimation, is 50 to 55%. The left ventricle has low normal function. Left ventricular endocardial border not optimally defined to evaluate regional wall motion. There is mild left ventricular hypertrophy. Left ventricular diastolic parameters are indeterminate.  2. Right ventricular systolic function is mildly reduced. The right ventricular size is mildly enlarged. There is mildly elevated pulmonary artery systolic pressure. The estimated right ventricular systolic pressure is 66.0 mmHg.  3. The mitral valve is grossly normal. Trivial mitral valve regurgitation. No evidence of mitral stenosis.  4. The aortic valve is grossly normal. There is mild calcification of the aortic valve. Aortic valve regurgitation is not visualized. No aortic stenosis is present.  5. The inferior vena cava is dilated in size with >50% respiratory variability,  suggesting right atrial pressure of 8 mmHg. FINDINGS  Left Ventricle: Left ventricular ejection fraction, by estimation, is 50 to 55%. The left ventricle has low normal function. Left ventricular endocardial border not optimally defined to evaluate regional wall motion. The left ventricular internal cavity  size was normal in size. There is mild left ventricular hypertrophy. Left ventricular diastolic parameters are indeterminate. Right Ventricle: The right ventricular size is mildly enlarged. No increase in right ventricular wall thickness. Right ventricular systolic function is mildly reduced. There is mildly elevated pulmonary artery systolic pressure. The tricuspid regurgitant  velocity is 2.63 m/s, and with an assumed right atrial pressure of 10 mmHg, the estimated right ventricular systolic pressure is 63.0 mmHg. Left Atrium: Left atrial size was normal in size. Right Atrium: Right atrial size was normal in size. Pericardium: There is no evidence of pericardial effusion. Mitral Valve: The mitral valve is grossly normal. Trivial mitral valve regurgitation. No evidence of mitral valve stenosis. Tricuspid Valve: The tricuspid valve is normal in structure. Tricuspid valve regurgitation is trivial. No evidence of tricuspid stenosis. Aortic Valve: The aortic valve is grossly normal. There is mild calcification of the aortic valve. Aortic valve regurgitation is not visualized. No aortic stenosis is present. Pulmonic Valve: The pulmonic valve was normal in structure. Pulmonic valve regurgitation is trivial. No evidence of pulmonic stenosis. Aorta: The aortic root is normal in size and structure. Venous: The inferior vena cava is dilated in size with greater than 50% respiratory variability, suggesting right atrial pressure of 8 mmHg. IAS/Shunts: No atrial level shunt detected by color flow Doppler.  LEFT VENTRICLE PLAX 2D LVIDd:         5.20 cm LVIDs:         3.60 cm LV PW:         1.10 cm LV IVS:        1.20 cm LVOT  diam:     1.90 cm LVOT Area:     2.84 cm  IVC IVC diam: 2.40 cm LEFT ATRIUM             Index        RIGHT ATRIUM           Index  LA diam:        4.30 cm 1.91 cm/m   RA Area:     17.90 cm LA Vol (A2C):   52.0 ml 23.12 ml/m  RA Volume:   49.50 ml  22.01 ml/m LA Vol (A4C):   60.1 ml 26.73 ml/m LA Biplane Vol: 58.9 ml 26.19 ml/m   AORTA Ao Root diam: 3.50 cm Ao Asc diam:  3.60 cm TRICUSPID VALVE TR Peak grad:   27.7 mmHg TR Vmax:        263.00 cm/s  SHUNTS Systemic Diam: 1.90 cm Cherlynn Kaiser MD Electronically signed by Cherlynn Kaiser MD Signature Date/Time: 08/14/2022/11:07:56 AM    Final         Scheduled Meds:  apixaban  5 mg Oral BID   diltiazem  240 mg Oral Daily   furosemide  40 mg Intravenous BID   metFORMIN  1,000 mg Oral BID WC   metoprolol tartrate  25 mg Oral BID   rosuvastatin  5 mg Oral QHS   Continuous Infusions:     LOS: 2 days    Time spent: 35 minutes    Dana Allan, MD  [facility] Pager #: [phone] 7PM-7AM contact night coverage as above

## 2022-08-15 NOTE — Progress Notes (Addendum)
Rounding Note    Patient Name: Devin Huffman Date of Encounter: 08/15/2022  [place] Cardiologist: None   Subjective   Feels much better. Asking to go home. Chest tightness resolved. HR well controlled on dilt gtt.  Inpatient Medications    Scheduled Meds:  apixaban  5 mg Oral BID   furosemide  40 mg Intravenous Daily   metFORMIN  1,000 mg Oral BID WC   metoprolol tartrate  25 mg Oral BID   rosuvastatin  5 mg Oral QHS   Continuous Infusions:  diltiazem (CARDIZEM) infusion 10 mg/hr (08/15/22 0140)   PRN Meds: acetaminophen **OR** acetaminophen, morphine injection   Vital Signs    Vitals:   08/14/22 1554 08/14/22 2034 08/14/22 2345 08/15/22 0415  BP: 121/69 123/71 100/69 114/67  Pulse: 75 (!) 59 62 85  Resp: '18 20 18 18  '$ Temp: 98.6 F (37 C) 98.9 F (37.2 C) 98.7 F (37.1 C) 98 F (36.7 C)  TempSrc: Oral Oral Oral Oral  SpO2: 93% 91% 95% 92%  Weight:    102.4 kg  Height:        Intake/Output Summary (Last 24 hours) at 08/15/2022 0755 Last data filed at 08/15/2022 9798 Gross per 24 hour  Intake 720 ml  Output 1050 ml  Net -330 ml      08/15/2022    4:15 AM 08/14/2022    6:41 AM 08/13/2022   10:05 AM  Last 3 Weights  Weight (lbs) 225 lb 11.2 oz 227 lb 1.6 oz 225 lb  Weight (kg) 102.377 kg 103.012 kg 102.059 kg      Telemetry    Afib rate controlled in 60-70s  - Personally Reviewed  ECG    No new tracing - Personally Reviewed  Physical Exam   GEN: No acute distress.   Neck: No JVD Cardiac: Irregular, no murmurs Respiratory: Bibasilar crackles GI: Soft, nontender, non-distended  MS: No edema; No deformity. Neuro:  Nonfocal  Psych: Normal affect   Labs    High Sensitivity Troponin:   Recent Labs  Lab 08/13/22 1030 08/13/22 1210  TROPONINIHS 4 3     Chemistry Recent Labs  Lab 08/13/22 1030 08/14/22 0143  NA 138 142  K 4.1 4.5  CL 112* 109  CO2 19* 23  GLUCOSE 120* 123*  BUN 17 18  CREATININE 1.09 1.20  CALCIUM 8.4*  9.2  PROT  --  6.1*  ALBUMIN  --  3.4*  AST  --  13*  ALT  --  16  ALKPHOS  --  48  BILITOT  --  0.9  GFRNONAA >60 >60  ANIONGAP 7 10    Lipids No results for input(s): "CHOL", "TRIG", "HDL", "LABVLDL", "LDLCALC", "CHOLHDL" in the last 168 hours.  Hematology Recent Labs  Lab 08/13/22 1030 08/14/22 0143  WBC 10.8* 12.3*  RBC 3.99* 4.12*  HGB 13.2 13.6  HCT 39.3 40.3  MCV 98.5 97.8  MCH 33.1 33.0  MCHC 33.6 33.7  RDW 12.3 12.4  PLT 201 223   Thyroid  Recent Labs  Lab 08/14/22 0143  TSH 3.429    BNP Recent Labs  Lab 08/13/22 1030  BNP 439.4*    DDimer  Recent Labs  Lab 08/13/22 1030  DDIMER 0.34     Radiology    ECHOCARDIOGRAM COMPLETE  Result Date: 08/14/2022    ECHOCARDIOGRAM REPORT   Patient Name:   Devin Huffman Date of Exam: 08/14/2022 Medical Rec #:  [id number]  Height:       72.0 in Accession #:    [id number]     Weight:       227.1 lb Date of Birth:  October 28, 1946     BSA:          2.249 m Patient Age:    76 years       BP:           116/66 mmHg Patient Gender: M              HR:           87 bpm. Exam Location:  Inpatient Procedure: 2D Echo Indications:    Atrial fibrillation  History:        Patient has prior history of Echocardiogram examinations, most                 recent 03/01/2017. Risk Factors:Dyslipidemia, Hypertension and                 Diabetes.  Sonographer:    Johny Chess RDCS Referring Phys: [phone] NA LI IMPRESSIONS  1. Left ventricular ejection fraction, by estimation, is 50 to 55%. The left ventricle has low normal function. Left ventricular endocardial border not optimally defined to evaluate regional wall motion. There is mild left ventricular hypertrophy. Left ventricular diastolic parameters are indeterminate.  2. Right ventricular systolic function is mildly reduced. The right ventricular size is mildly enlarged. There is mildly elevated pulmonary artery systolic pressure. The estimated right ventricular systolic pressure is 14.9 mmHg.   3. The mitral valve is grossly normal. Trivial mitral valve regurgitation. No evidence of mitral stenosis.  4. The aortic valve is grossly normal. There is mild calcification of the aortic valve. Aortic valve regurgitation is not visualized. No aortic stenosis is present.  5. The inferior vena cava is dilated in size with >50% respiratory variability, suggesting right atrial pressure of 8 mmHg. FINDINGS  Left Ventricle: Left ventricular ejection fraction, by estimation, is 50 to 55%. The left ventricle has low normal function. Left ventricular endocardial border not optimally defined to evaluate regional wall motion. The left ventricular internal cavity  size was normal in size. There is mild left ventricular hypertrophy. Left ventricular diastolic parameters are indeterminate. Right Ventricle: The right ventricular size is mildly enlarged. No increase in right ventricular wall thickness. Right ventricular systolic function is mildly reduced. There is mildly elevated pulmonary artery systolic pressure. The tricuspid regurgitant  velocity is 2.63 m/s, and with an assumed right atrial pressure of 10 mmHg, the estimated right ventricular systolic pressure is 70.2 mmHg. Left Atrium: Left atrial size was normal in size. Right Atrium: Right atrial size was normal in size. Pericardium: There is no evidence of pericardial effusion. Mitral Valve: The mitral valve is grossly normal. Trivial mitral valve regurgitation. No evidence of mitral valve stenosis. Tricuspid Valve: The tricuspid valve is normal in structure. Tricuspid valve regurgitation is trivial. No evidence of tricuspid stenosis. Aortic Valve: The aortic valve is grossly normal. There is mild calcification of the aortic valve. Aortic valve regurgitation is not visualized. No aortic stenosis is present. Pulmonic Valve: The pulmonic valve was normal in structure. Pulmonic valve regurgitation is trivial. No evidence of pulmonic stenosis. Aorta: The aortic root is  normal in size and structure. Venous: The inferior vena cava is dilated in size with greater than 50% respiratory variability, suggesting right atrial pressure of 8 mmHg. IAS/Shunts: No atrial level shunt detected by color flow Doppler.  LEFT VENTRICLE PLAX 2D  LVIDd:         5.20 cm LVIDs:         3.60 cm LV PW:         1.10 cm LV IVS:        1.20 cm LVOT diam:     1.90 cm LVOT Area:     2.84 cm  IVC IVC diam: 2.40 cm LEFT ATRIUM             Index        RIGHT ATRIUM           Index LA diam:        4.30 cm 1.91 cm/m   RA Area:     17.90 cm LA Vol (A2C):   52.0 ml 23.12 ml/m  RA Volume:   49.50 ml  22.01 ml/m LA Vol (A4C):   60.1 ml 26.73 ml/m LA Biplane Vol: 58.9 ml 26.19 ml/m   AORTA Ao Root diam: 3.50 cm Ao Asc diam:  3.60 cm TRICUSPID VALVE TR Peak grad:   27.7 mmHg TR Vmax:        263.00 cm/s  SHUNTS Systemic Diam: 1.90 cm Cherlynn Kaiser MD Electronically signed by Cherlynn Kaiser MD Signature Date/Time: 08/14/2022/11:07:56 AM    Final    DG Chest 2 View  Result Date: 08/13/2022 CLINICAL DATA:  Chest pain. Shortness of breath. Lightheadedness when walking. EXAM: CHEST - 2 VIEW COMPARISON:  05/30/2021 FINDINGS: Stable cardiomediastinal contours. There is a new left pleural effusion. Pulmonary vascular congestion is identified on today's study. Mild atelectasis identified in the left base. Visualized osseous structures are unremarkable. IMPRESSION: New left pleural effusion and pulmonary vascular congestion suggestive of CHF. Electronically Signed   By: Kerby Moors M.D.   On: 08/13/2022 11:07    Cardiac Studies   TTE 08/14/22: IMPRESSIONS   1. Left ventricular ejection fraction, by estimation, is 50 to 55%. The  left ventricle has low normal function. Left ventricular endocardial  border not optimally defined to evaluate regional wall motion. There is  mild left ventricular hypertrophy. Left  ventricular diastolic parameters are indeterminate.   2. Right ventricular systolic function is  mildly reduced. The right  ventricular size is mildly enlarged. There is mildly elevated pulmonary  artery systolic pressure. The estimated right ventricular systolic  pressure is 77.4 mmHg.   3. The mitral valve is grossly normal. Trivial mitral valve  regurgitation. No evidence of mitral stenosis.   4. The aortic valve is grossly normal. There is mild calcification of the  aortic valve. Aortic valve regurgitation is not visualized. No aortic  stenosis is present.   5. The inferior vena cava is dilated in size with >50% respiratory  variability, suggesting right atrial pressure of 8 mmHg.   Patient Profile     76 y.o. male with history of DMII, HTN, and HLD who presented with chest pain and SOB found to have acute on chronic diastolic HF exacerbation as well as Afib with RVR for which Cardiology was consulted.   Assessment & Plan    #Acute Diastolic HF: Patient presented with worsening chest pain and SOB found to have new Afib with RVR and volume overload with BNP 439. TTE with LVEF 50-55%, mild RV dysfunction, mild PHTN, trivial MR, RAP 21mHg. Has been given lasix '40mg'$  IV x2 doses with improvement of symptoms but remains mildly overloaded on exam today. Will re-dose lasix '40mg'$  IV x2 doses today and monitor response. Can likely transition to PO tomorrow.  -Increase  lasix '40mg'$  IV BID for today; likely transition to PO tomorrow -Will add spiro and farxiga as tolerated  -Low Na diet -Monitor I/Os and daily weights  #Afib with RVR: CHADs-vasc 3. Heart rates much better controlled this AM. Will transition from IV to PO dilt and monitor response. If HR remains controlled, can likely plan for outpatient DCCV once he completes 3 weeks of AC  -Continue apixaban '5mg'$  BID -Continue metop '25mg'$  BID -Wean off dilt gtt as tolerated and start dilt '240mg'$  PO daily -If able to maintain good rate control, can likely plan for outpatient DCCV after 3 weeks of AC if remains in Afib  #Chest Pain: Resolved.  Likely due to volume overload and Afib with RVR. Trop negative. No ischemic work-up needed at this time.  #HLD: -Continue crestor '5mg'$  daily  #DMII: -Management per primary       For questions or updates, please contact [place] Please consult [email or web address] for contact info under        Signed, Freada Bergeron, MD  08/15/2022, 7:55 AM

## 2022-08-15 NOTE — Progress Notes (Signed)
99% o2 on room air at rest  90% o2 on room air while ambulating  92% o2 on room air post ambulation.

## 2022-08-16 ENCOUNTER — Other Ambulatory Visit (HOSPITAL_COMMUNITY): Payer: Self-pay

## 2022-08-16 DIAGNOSIS — J9601 Acute respiratory failure with hypoxia: Secondary | ICD-10-CM | POA: Diagnosis not present

## 2022-08-16 DIAGNOSIS — J810 Acute pulmonary edema: Secondary | ICD-10-CM | POA: Diagnosis not present

## 2022-08-16 DIAGNOSIS — I509 Heart failure, unspecified: Secondary | ICD-10-CM

## 2022-08-16 DIAGNOSIS — I4891 Unspecified atrial fibrillation: Secondary | ICD-10-CM | POA: Diagnosis not present

## 2022-08-16 DIAGNOSIS — E1169 Type 2 diabetes mellitus with other specified complication: Secondary | ICD-10-CM | POA: Diagnosis not present

## 2022-08-16 DIAGNOSIS — I10 Essential (primary) hypertension: Secondary | ICD-10-CM | POA: Diagnosis not present

## 2022-08-16 LAB — BASIC METABOLIC PANEL
Anion gap: 11 (ref 5–15)
BUN: 16 mg/dL (ref 8–23)
CO2: 23 mmol/L (ref 22–32)
Calcium: 9 mg/dL (ref 8.9–10.3)
Chloride: 105 mmol/L (ref 98–111)
Creatinine, Ser: 1.25 mg/dL — ABNORMAL HIGH (ref 0.61–1.24)
GFR, Estimated: 59 mL/min — ABNORMAL LOW (ref >60)
Glucose, Bld: 138 mg/dL — ABNORMAL HIGH (ref 70–99)
Potassium: 3.7 mmol/L (ref 3.5–5.1)
Sodium: 139 mmol/L (ref 135–145)

## 2022-08-16 LAB — GLUCOSE, CAPILLARY
Glucose-Capillary: 117 mg/dL — ABNORMAL HIGH (ref 70–99)
Glucose-Capillary: 87 mg/dL (ref 70–99)

## 2022-08-16 MED ORDER — FUROSEMIDE 20 MG PO TABS
20.0000 mg | ORAL_TABLET | Freq: Every day | ORAL | 0 refills | Status: DC
  Filled 2022-08-16: qty 30, 30d supply, fill #0

## 2022-08-16 MED ORDER — FUROSEMIDE 20 MG PO TABS
20.0000 mg | ORAL_TABLET | Freq: Every day | ORAL | Status: DC
  Administered 2022-08-16: 20 mg via ORAL
  Filled 2022-08-16: qty 1

## 2022-08-16 MED ORDER — DILTIAZEM HCL ER COATED BEADS 240 MG PO CP24
240.0000 mg | ORAL_CAPSULE | Freq: Every day | ORAL | 0 refills | Status: DC
  Filled 2022-08-16: qty 30, 30d supply, fill #0

## 2022-08-16 MED ORDER — APIXABAN 5 MG PO TABS
5.0000 mg | ORAL_TABLET | Freq: Two times a day (BID) | ORAL | 0 refills | Status: DC
  Filled 2022-08-16: qty 60, 30d supply, fill #0

## 2022-08-16 MED ORDER — METOPROLOL TARTRATE 25 MG PO TABS
25.0000 mg | ORAL_TABLET | Freq: Two times a day (BID) | ORAL | 0 refills | Status: DC
  Filled 2022-08-16: qty 60, 30d supply, fill #0

## 2022-08-16 MED ORDER — METFORMIN HCL 1000 MG PO TABS
1000.0000 mg | ORAL_TABLET | Freq: Two times a day (BID) | ORAL | 0 refills | Status: DC
  Filled 2022-08-16: qty 60, 30d supply, fill #0

## 2022-08-16 NOTE — Progress Notes (Signed)
Explained discharge summary to patient and his wife. Reviewed follow up appointments and next medication administration times. Both explained having an understanding. Also reviewed eliquis and diabetic education. IV and tele monitor was removed prior to my explaining discharge summary by floor staff. All belongings were in the patient's possession along with his TOC medicines.

## 2022-08-16 NOTE — Hospital Course (Signed)
76 year old Caucasian male past medical history significant for hypertension, hyperlipidemia, type 2 diabetes mellitus and GERD.  Patient presented with chest pressures.  On presentation, patient was found to be in A-fib with rapid ventricular response.  Work-up done so far has also revealed acute congestive heart failure with preserved ejection fraction.  Patient is currently being diuresed.  Patient is on Cardizem and metoprolol.  Cardiology team has been consulted.  TSH is within normal range.  D-dimer is negative.  Echocardiogram revealed preserved ejection fraction, LVH, indeterminate diastolic dysfunction and mildly elevated pulmonary artery systolic pressure.

## 2022-08-16 NOTE — Progress Notes (Signed)
Rounding Note    Patient Name: Devin Huffman Date of Encounter: 08/16/2022  [place] Cardiologist: None   Subjective   Patient feels great. Asking to go home.  Converted back to NSR today  Inpatient Medications    Scheduled Meds:  apixaban  5 mg Oral BID   diltiazem  240 mg Oral Daily   metFORMIN  1,000 mg Oral BID WC   metoprolol tartrate  25 mg Oral BID   rosuvastatin  5 mg Oral QHS   Continuous Infusions:   PRN Meds: acetaminophen **OR** acetaminophen, morphine injection   Vital Signs    Vitals:   08/15/22 1957 08/16/22 0000 08/16/22 0421 08/16/22 0800  BP: 134/71 129/84 130/73 136/79  Pulse: 87 81 80 77  Resp: '18 18 17 '$ (!) 22  Temp: 99 F (37.2 C) 98.7 F (37.1 C) 98.7 F (37.1 C) 98 F (36.7 C)  TempSrc: Oral Oral Oral Oral  SpO2: 94% 91% 92% 94%  Weight:   100.6 kg   Height:        Intake/Output Summary (Last 24 hours) at 08/16/2022 1114 Last data filed at 08/16/2022 0424 Gross per 24 hour  Intake --  Output 750 ml  Net -750 ml       08/16/2022    4:21 AM 08/15/2022    4:15 AM 08/14/2022    6:41 AM  Last 3 Weights  Weight (lbs) 221 lb 11.2 oz 225 lb 11.2 oz 227 lb 1.6 oz  Weight (kg) 100.562 kg 102.377 kg 103.012 kg      Telemetry    Converted back to NSR - Personally Reviewed  ECG    No new tracing today- Personally Reviewed  Physical Exam   GEN: No acute distress.   Neck: No JVD Cardiac: RRR, no murmurs Respiratory: CTAB GI: Soft, nontender, non-distended  MS: Trace edema, warm Neuro:  Nonfocal  Psych: Normal affect   Labs    High Sensitivity Troponin:   Recent Labs  Lab 08/13/22 1030 08/13/22 1210  TROPONINIHS 4 3      Chemistry Recent Labs  Lab 08/13/22 1030 08/14/22 0143 08/15/22 0746  NA 138 142 139  K 4.1 4.5 4.1  CL 112* 109 108  CO2 19* 23 21*  GLUCOSE 120* 123* 163*  BUN '17 18 16  '$ CREATININE 1.09 1.20 1.15  CALCIUM 8.4* 9.2 8.8*  MG  --   --  1.8  PROT  --  6.1*  --   ALBUMIN  --  3.4*  3.3*  AST  --  13*  --   ALT  --  16  --   ALKPHOS  --  48  --   BILITOT  --  0.9  --   GFRNONAA >60 >60 >60  ANIONGAP '7 10 10     '$ Lipids No results for input(s): "CHOL", "TRIG", "HDL", "LABVLDL", "LDLCALC", "CHOLHDL" in the last 168 hours.  Hematology Recent Labs  Lab 08/13/22 1030 08/14/22 0143  WBC 10.8* 12.3*  RBC 3.99* 4.12*  HGB 13.2 13.6  HCT 39.3 40.3  MCV 98.5 97.8  MCH 33.1 33.0  MCHC 33.6 33.7  RDW 12.3 12.4  PLT 201 223    Thyroid  Recent Labs  Lab 08/14/22 0143  TSH 3.429     BNP Recent Labs  Lab 08/13/22 1030  BNP 439.4*     DDimer  Recent Labs  Lab 08/13/22 1030  DDIMER 0.34      Radiology    No results found.  Cardiac Studies   TTE 08/14/22: IMPRESSIONS   1. Left ventricular ejection fraction, by estimation, is 50 to 55%. The  left ventricle has low normal function. Left ventricular endocardial  border not optimally defined to evaluate regional wall motion. There is  mild left ventricular hypertrophy. Left  ventricular diastolic parameters are indeterminate.   2. Right ventricular systolic function is mildly reduced. The right  ventricular size is mildly enlarged. There is mildly elevated pulmonary  artery systolic pressure. The estimated right ventricular systolic  pressure is 02.4 mmHg.   3. The mitral valve is grossly normal. Trivial mitral valve  regurgitation. No evidence of mitral stenosis.   4. The aortic valve is grossly normal. There is mild calcification of the  aortic valve. Aortic valve regurgitation is not visualized. No aortic  stenosis is present.   5. The inferior vena cava is dilated in size with >50% respiratory  variability, suggesting right atrial pressure of 8 mmHg.   Patient Profile     76 y.o. male with history of DMII, HTN, and HLD who presented with chest pain and SOB found to have acute on chronic diastolic HF exacerbation as well as Afib with RVR for which Cardiology was consulted.   Assessment &  Plan    #Acute Diastolic HF: Patient presented with worsening chest pain and SOB found to have new Afib with RVR and volume overload with BNP 439. TTE with LVEF 50-55%, mild RV dysfunction, mild PHTN, trivial MR, RAP 42mHg. Has been diuresed with lasix with good response. Appears euvolemic today and will transition to lasix '20mg'$  daily. -Change to lasix '20mg'$  PO daily -Awaiting BMET to assess ability to add spiro/SGLT2i; can add as outpatient -Low Na diet -Monitor I/Os and daily weights  #Afib with RVR: CHADs-vasc 3. Converted back to NSR today. Will continue apxiaban and metop/dilt for rate control. -Continue apixaban '5mg'$  BID -Continue metop '25mg'$  BID -Continue dilt '240mg'$  PO daily  #Chest Pain: Resolved. Likely due to volume overload and Afib with RVR. Trop negative. No ischemic work-up needed at this time.  #HLD: -Continue crestor '5mg'$  daily  #DMII: -Management per primary  Cardiology will sign-off. Will arrange CV follow-up.      For questions or updates, please contact [place]Please consult [email or web address] for contact info under        Signed, HFreada Bergeron MD  08/16/2022, 11:14 AM

## 2022-08-16 NOTE — Plan of Care (Signed)
  Problem: Education: Goal: Knowledge of General Education information will improve Description: Including pain rating scale, medication(s)/side effects and non-pharmacologic comfort measures 08/16/2022 1401 by Delia Chimes, RN Outcome: Adequate for Discharge 08/16/2022 1401 by Delia Chimes, RN Outcome: Adequate for Discharge   Problem: Health Behavior/Discharge Planning: Goal: Ability to manage health-related needs will improve 08/16/2022 1401 by Delia Chimes, RN Outcome: Adequate for Discharge 08/16/2022 1401 by Delia Chimes, RN Outcome: Adequate for Discharge   Problem: Clinical Measurements: Goal: Ability to maintain clinical measurements within normal limits will improve 08/16/2022 1401 by Delia Chimes, RN Outcome: Adequate for Discharge 08/16/2022 1401 by Delia Chimes, RN Outcome: Adequate for Discharge Goal: Will remain free from infection 08/16/2022 1401 by Delia Chimes, RN Outcome: Adequate for Discharge 08/16/2022 1401 by Delia Chimes, RN Outcome: Adequate for Discharge Goal: Diagnostic test results will improve 08/16/2022 1401 by Delia Chimes, RN Outcome: Adequate for Discharge 08/16/2022 1401 by Delia Chimes, RN Outcome: Adequate for Discharge Goal: Respiratory complications will improve 08/16/2022 1401 by Delia Chimes, RN Outcome: Adequate for Discharge 08/16/2022 1401 by Delia Chimes, RN Outcome: Adequate for Discharge Goal: Cardiovascular complication will be avoided 08/16/2022 1401 by Delia Chimes, RN Outcome: Adequate for Discharge 08/16/2022 1401 by Delia Chimes, RN Outcome: Adequate for Discharge   Problem: Activity: Goal: Risk for activity intolerance will decrease 08/16/2022 1401 by Delia Chimes, RN Outcome: Adequate for Discharge 08/16/2022 1401 by Delia Chimes, RN Outcome: Adequate for Discharge   Problem: Nutrition: Goal: Adequate nutrition will be maintained 08/16/2022 1401 by Delia Chimes, RN Outcome:  Adequate for Discharge 08/16/2022 1401 by Delia Chimes, RN Outcome: Adequate for Discharge   Problem: Coping: Goal: Level of anxiety will decrease 08/16/2022 1401 by Delia Chimes, RN Outcome: Adequate for Discharge 08/16/2022 1401 by Delia Chimes, RN Outcome: Adequate for Discharge   Problem: Pain Managment: Goal: General experience of comfort will improve 08/16/2022 1401 by Delia Chimes, RN Outcome: Adequate for Discharge 08/16/2022 1401 by Delia Chimes, RN Outcome: Adequate for Discharge   Problem: Cardiovascular: Goal: Ability to achieve and maintain adequate cardiovascular perfusion will improve 08/16/2022 1401 by Delia Chimes, RN Outcome: Adequate for Discharge 08/16/2022 1401 by Delia Chimes, RN Outcome: Adequate for Discharge Goal: Vascular access site(s) Level 0-1 will be maintained 08/16/2022 1401 by Delia Chimes, RN Outcome: Adequate for Discharge 08/16/2022 1401 by Delia Chimes, RN Outcome: Adequate for Discharge   Problem: Education: Goal: Understanding of cardiac disease, CV risk reduction, and recovery process will improve 08/16/2022 1401 by Delia Chimes, RN Outcome: Adequate for Discharge 08/16/2022 1401 by Delia Chimes, RN Outcome: Adequate for Discharge Goal: Individualized Educational Video(s) 08/16/2022 1401 by Delia Chimes, RN Outcome: Adequate for Discharge 08/16/2022 1401 by Delia Chimes, RN Outcome: Adequate for Discharge

## 2022-08-16 NOTE — Discharge Summary (Signed)
Physician Discharge Summary   Patient: Devin Huffman MRN: [id number] DOB: [birth year]  Admit date:     08/13/2022  Discharge date: 08/16/22  Discharge Physician: Marylu Lund   PCP: Ria Bush, MD   Recommendations at discharge:    Follow up with PCP in 1-2 weeks Follow up with Cardiology as scheduled  Discharge Diagnoses: Principal Problem:   Atrial fibrillation with RVR ([place]) Active Problems:   Dyslipidemia associated with type 2 diabetes mellitus ([place])   Essential hypertension   Type 2 diabetes mellitus with other specified complication (HCC)   Obesity, Class I, BMI 30-34.9   Type 2 diabetes mellitus with diabetic neuropathy, unspecified (HCC)   Acute respiratory failure with hypoxia (HCC)   Acute pulmonary edema (HCC)   Pleural effusion  Resolved Problems:   * No resolved hospital problems. *  Hospital Course: 76 year old Caucasian male past medical history significant for hypertension, hyperlipidemia, type 2 diabetes mellitus and GERD.  Patient presented with chest pressures.  On presentation, patient was found to be in A-fib with rapid ventricular response.  Work-up done so far has also revealed acute congestive heart failure with preserved ejection fraction.  Patient is currently being diuresed.  Patient is on Cardizem and metoprolol.  Cardiology team has been consulted.  TSH is within normal range.  D-dimer is negative.  Echocardiogram revealed preserved ejection fraction, LVH, indeterminate diastolic dysfunction and mildly elevated pulmonary artery systolic pressure.  Assessment and Plan: New onset A fib with RVR: -Started oral Cardizem. -Apixaban 5 Mg p.o. twice daily. -Metoprolol tartrate 25 Mg p.o. twice daily. -Echocardiogram revealed normal ejection fraction. -TSH is normal. -D-dimer is negative. -Cardiology has since signed off 9/6 and pt eager to go home -Pt to f/u closely with Cardiology as outpatient   Acute CHF with preserved EF: -Continue lasix  80m po daily -Continue metoprolol 257mbid -dilt 24059maily   Chest pressure: -Resolved. -Likely secondary to A-fib with RVR.   Diabetes mellitus type 2: -Continue to optimize. -Cont home on meformin on d/c   Hypertension: -remained stable        Consultants: Cardiology Procedures performed:   Disposition: Home Diet recommendation:  Cardiac diet DISCHARGE MEDICATION: Allergies as of 08/16/2022       Reactions   Lisinopril Hives, Swelling   angioedema - lip swelling, hives        Medication List     STOP taking these medications    amLODipine 10 MG tablet Commonly known as: NORVASC   metFORMIN 750 MG 24 hr tablet Commonly known as: GLUCOPHAGE-XR Replaced by: metFORMIN 1000 MG tablet   metoprolol succinate 25 MG 24 hr tablet Commonly known as: TOPROL-XL       TAKE these medications    aspirin EC 81 MG tablet Take 81 mg by mouth every evening.   blood glucose meter kit and supplies Kit Dispense based on patient and insurance preference. Use to check sugars up to twice daily as directed (E11.69).   Cartia XT 240 MG 24 hr capsule Generic drug: diltiazem Take 1 capsule (240 mg total) by mouth daily. Start taking on: August 17, 2022   CO Q 10 PO Take 1 capsule by mouth at bedtime.   Eliquis 5 MG Tabs tablet Generic drug: apixaban Take 1 tablet (5 mg total) by mouth 2 (two) times daily.   EYE VITAMINS PO Take 2 tablets by mouth at bedtime.   FISH OIL PO Take 1 capsule by mouth daily.   furosemide 20 MG tablet Commonly known  as: LASIX Take 1 tablet (20 mg total) by mouth daily. Start taking on: August 17, 2022   Glucose Meter Test test strip Generic drug: glucose blood Use as instructed to check sugars twice daily as needed E11.69   metFORMIN 1000 MG tablet Commonly known as: GLUCOPHAGE Take 1 tablet (1,000 mg total) by mouth 2 (two) times daily with a meal. Replaces: metFORMIN 750 MG 24 hr tablet   metoprolol tartrate 25 MG  tablet Commonly known as: LOPRESSOR Take 1 tablet (25 mg total) by mouth 2 (two) times daily.   Ozempic (0.25 or 0.5 MG/DOSE) 2 MG/1.5ML Sopn Generic drug: Semaglutide(0.25 or 0.5MG/DOS) Inject 0.5 mg into the skin once a week.   rosuvastatin 5 MG tablet Commonly known as: CRESTOR TAKE ONE TABLET BY MOUTH ONCE DAILY What changed: when to take this   TUMS CHEWY BITES PO Take 2 tablets by mouth at bedtime as needed (reflux).   VITAMIN B-12 PO Take 1 capsule by mouth at bedtime.   vitamin C 1000 MG tablet Take 1,000 mg by mouth every evening.   Vitamin D3 25 MCG (1000 UT) Caps Take 1,000 Units by mouth every evening.        Follow-up Information     Ria Bush, MD Follow up in 2 week(s).   Specialty: Family Medicine Why: Hospital follow up Contact information: [place] [address] [phone]         Darreld Mclean, PA-C Follow up.   Specialties: Physician Assistant, Cardiology Why: Hospital follow-up with Cardiology scheduled at 08/24/2022 at 2:45pm. Please arrive 15 minutes early for check-in. If this date/time does not work for you, please call our office to reschedule. Contact information: [address] [place] [address] [phone]                Discharge Exam: Danley Danker Weights   08/14/22 1594 08/15/22 0415 08/16/22 0421  Weight: 103 kg 102.4 kg 100.6 kg   General exam: Awake, laying in bed, in nad Respiratory system: Normal respiratory effort, no wheezing Cardiovascular system: regular rate, s1, s2 Gastrointestinal system: Soft, nondistended, positive BS Central nervous system: CN2-12 grossly intact, strength intact Extremities: Perfused, no clubbing Skin: Normal skin turgor, no notable skin lesions seen Psychiatry: Mood normal // no visual hallucinations   Condition at discharge: fair  The results of significant diagnostics from this hospitalization (including imaging, microbiology,  ancillary and laboratory) are listed below for reference.   Imaging Studies: ECHOCARDIOGRAM COMPLETE  Result Date: 08/14/2022    ECHOCARDIOGRAM REPORT   Patient Name:   Devin Huffman Date of Exam: 08/14/2022 Medical Rec #:  [id number]      Height:       72.0 in Accession #:    [id number]     Weight:       227.1 lb Date of Birth:  June 15, 1946     BSA:          2.249 m Patient Age:    34 years       BP:           116/66 mmHg Patient Gender: M              HR:           87 bpm. Exam Location:  Inpatient Procedure: 2D Echo Indications:    Atrial fibrillation  History:        Patient has prior history of Echocardiogram examinations, most  recent 03/01/2017. Risk Factors:Dyslipidemia, Hypertension and                 Diabetes.  Sonographer:    Johny Chess RDCS Referring Phys: [phone] NA LI IMPRESSIONS  1. Left ventricular ejection fraction, by estimation, is 50 to 55%. The left ventricle has low normal function. Left ventricular endocardial border not optimally defined to evaluate regional wall motion. There is mild left ventricular hypertrophy. Left ventricular diastolic parameters are indeterminate.  2. Right ventricular systolic function is mildly reduced. The right ventricular size is mildly enlarged. There is mildly elevated pulmonary artery systolic pressure. The estimated right ventricular systolic pressure is 17.4 mmHg.  3. The mitral valve is grossly normal. Trivial mitral valve regurgitation. No evidence of mitral stenosis.  4. The aortic valve is grossly normal. There is mild calcification of the aortic valve. Aortic valve regurgitation is not visualized. No aortic stenosis is present.  5. The inferior vena cava is dilated in size with >50% respiratory variability, suggesting right atrial pressure of 8 mmHg. FINDINGS  Left Ventricle: Left ventricular ejection fraction, by estimation, is 50 to 55%. The left ventricle has low normal function. Left ventricular endocardial border not optimally  defined to evaluate regional wall motion. The left ventricular internal cavity  size was normal in size. There is mild left ventricular hypertrophy. Left ventricular diastolic parameters are indeterminate. Right Ventricle: The right ventricular size is mildly enlarged. No increase in right ventricular wall thickness. Right ventricular systolic function is mildly reduced. There is mildly elevated pulmonary artery systolic pressure. The tricuspid regurgitant  velocity is 2.63 m/s, and with an assumed right atrial pressure of 10 mmHg, the estimated right ventricular systolic pressure is 94.4 mmHg. Left Atrium: Left atrial size was normal in size. Right Atrium: Right atrial size was normal in size. Pericardium: There is no evidence of pericardial effusion. Mitral Valve: The mitral valve is grossly normal. Trivial mitral valve regurgitation. No evidence of mitral valve stenosis. Tricuspid Valve: The tricuspid valve is normal in structure. Tricuspid valve regurgitation is trivial. No evidence of tricuspid stenosis. Aortic Valve: The aortic valve is grossly normal. There is mild calcification of the aortic valve. Aortic valve regurgitation is not visualized. No aortic stenosis is present. Pulmonic Valve: The pulmonic valve was normal in structure. Pulmonic valve regurgitation is trivial. No evidence of pulmonic stenosis. Aorta: The aortic root is normal in size and structure. Venous: The inferior vena cava is dilated in size with greater than 50% respiratory variability, suggesting right atrial pressure of 8 mmHg. IAS/Shunts: No atrial level shunt detected by color flow Doppler.  LEFT VENTRICLE PLAX 2D LVIDd:         5.20 cm LVIDs:         3.60 cm LV PW:         1.10 cm LV IVS:        1.20 cm LVOT diam:     1.90 cm LVOT Area:     2.84 cm  IVC IVC diam: 2.40 cm LEFT ATRIUM             Index        RIGHT ATRIUM           Index LA diam:        4.30 cm 1.91 cm/m   RA Area:     17.90 cm LA Vol (A2C):   52.0 ml 23.12 ml/m   RA Volume:   49.50 ml  22.01 ml/m LA Vol (A4C):   60.1  ml 26.73 ml/m LA Biplane Vol: 58.9 ml 26.19 ml/m   AORTA Ao Root diam: 3.50 cm Ao Asc diam:  3.60 cm TRICUSPID VALVE TR Peak grad:   27.7 mmHg TR Vmax:        263.00 cm/s  SHUNTS Systemic Diam: 1.90 cm Cherlynn Kaiser MD Electronically signed by Cherlynn Kaiser MD Signature Date/Time: 08/14/2022/11:07:56 AM    Final    DG Chest 2 View  Result Date: 08/13/2022 CLINICAL DATA:  Chest pain. Shortness of breath. Lightheadedness when walking. EXAM: CHEST - 2 VIEW COMPARISON:  05/30/2021 FINDINGS: Stable cardiomediastinal contours. There is a new left pleural effusion. Pulmonary vascular congestion is identified on today's study. Mild atelectasis identified in the left base. Visualized osseous structures are unremarkable. IMPRESSION: New left pleural effusion and pulmonary vascular congestion suggestive of CHF. Electronically Signed   By: Kerby Moors M.D.   On: 08/13/2022 11:07    Microbiology: Results for orders placed or performed during the hospital encounter of 01/22/20  Blood Culture (routine x 2)     Status: None   Collection Time: 01/22/20  4:02 PM   Specimen: BLOOD  Result Value Ref Range Status   Specimen Description   Final    BLOOD LEFT ANTECUBITAL Performed at [facility] [address] [place], [place] [address]    Special Requests   Final    BOTTLES DRAWN AEROBIC AND ANAEROBIC Blood Culture adequate volume Performed at [facility] [address] [place], [place] [address]    Culture   Final    NO GROWTH 5 DAYS Performed at [facility], [place] [address] [place], [place] [address]    Report Status 01/27/2020 FINAL  Final  SARS CORONAVIRUS 2 (TAT 6-24 HRS) Nasopharyngeal Nasopharyngeal Swab     Status: Abnormal   Collection Time: 01/22/20  4:02 PM   Specimen: Nasopharyngeal Swab  Result Value Ref Range Status   SARS Coronavirus 2 POSITIVE (A) NEGATIVE Final    Comment: RESULT  CALLED TO, READ BACK BY AND VERIFIED WITH: S. MCDONALD,RN 0248 01/23/2020 T. TYSOR (NOTE) SARS-CoV-2 target nucleic acids are DETECTED. The SARS-CoV-2 RNA is generally detectable in upper and lower respiratory specimens during the acute phase of infection. Positive results are indicative of the presence of SARS-CoV-2 RNA. Clinical correlation with patient history and other diagnostic information is  necessary to determine patient infection status. Positive results do not rule out bacterial infection or co-infection with other viruses.  The expected result is Negative. Fact Sheet for Patients: SugarRoll.be Fact Sheet for Healthcare Providers: https://www.woods-mathews.com/ This test is not yet approved or cleared by the [place] FDA and  has been authorized for detection and/or diagnosis of SARS-CoV-2 by FDA under an Emergency Use Authorization (EUA). This EUA will remain  in effect (meaning this test can be used) for  the duration of the COVID-19 declaration under Section 564(b)(1) of the Act, 21 U.S.C. section 360bbb-3(b)(1), unless the authorization is terminated or revoked sooner. Performed at [facility], [place] [address] [place], [place] [address]   Blood Culture (routine x 2)     Status: None   Collection Time: 01/22/20  4:51 PM   Specimen: BLOOD RIGHT HAND  Result Value Ref Range Status   Specimen Description   Final    BLOOD RIGHT HAND Performed at [facility] [address], [place], [place] [address]    Special Requests   Final    BOTTLES DRAWN AEROBIC AND ANAEROBIC  Blood Culture adequate volume Performed at [facility] [address] [place], [place] [address]    Culture   Final    NO GROWTH 5 DAYS Performed at [facility], [place] [address], [place], [place] [address]    Report Status 01/27/2020 FINAL  Final    Labs: CBC: Recent Labs  Lab 08/13/22 1030  08/14/22 0143  WBC 10.8* 12.3*  HGB 13.2 13.6  HCT 39.3 40.3  MCV 98.5 97.8  PLT 201 958   Basic Metabolic Panel: Recent Labs  Lab 08/13/22 1030 08/14/22 0143 08/15/22 0746 08/16/22 1308  NA 138 142 139 139  K 4.1 4.5 4.1 3.7  CL 112* 109 108 105  CO2 19* 23 21* 23  GLUCOSE 120* 123* 163* 138*  BUN _0 CREATININE 1.09 1.20 1.15 1.25*  CALCIUM 8.4* 9.2 8.8* 9.0  MG  --   --  1.8  --   PHOS  --   --  3.2  --    Liver Function Tests: Recent Labs  Lab 08/14/22 0143 08/15/22 0746  AST 13*  --   ALT 16  --   ALKPHOS 48  --   BILITOT 0.9  --   PROT 6.1*  --   ALBUMIN 3.4* 3.3*   CBG: Recent Labs  Lab 08/15/22 1214 08/15/22 1647 08/15/22 2113 08/16/22 0616 08/16/22 1117  GLUCAP 117* 139* 98 117* 87    Discharge time spent: less than 30 minutes.  Signed: Marylu Lund, MD [facility] 08/16/2022

## 2022-08-16 NOTE — Care Management Important Message (Signed)
Important Message  Patient Details  Name: JUJHAR EVERETT MRN: [id number] Date of Birth: 1946/08/21   Medicare Important Message Given:  Yes     Shelda Altes 08/16/2022, 7:54 AM

## 2022-08-17 ENCOUNTER — Telehealth: Payer: Self-pay | Admitting: *Deleted

## 2022-08-17 ENCOUNTER — Other Ambulatory Visit (HOSPITAL_COMMUNITY): Payer: Self-pay

## 2022-08-17 NOTE — Patient Outreach (Signed)
  Care Coordination [facility] Note Transition Care Management Follow-up Telephone Call Date of discharge and from where: [facility] [phone] How have you been since you were released from the hospital?good  Any questions or concerns? No  Items Reviewed: Did the pt receive and understand the discharge instructions provided? Yes  Medications obtained and verified? Yes  Other? No  Any new allergies since your discharge? No  Dietary orders reviewed? Yes Do you have support at home? Yes   Home Care and Equipment/Supplies: Were home health services ordered? not applicable If so, what is the name of the agency? N/a  Has the agency set up a time to come to the patient's home? not applicable Were any new equipment or medical supplies ordered?  No What is the name of the medical supply agency? N/a Were you able to get the supplies/equipment? not applicable Do you have any questions related to the use of the equipment or supplies? No  Functional Questionnaire: (I = Independent and D = Dependent) ADLs: I  Bathing/Dressing- I  Meal Prep- I  Eating- I  Maintaining continence- I  Transferring/Ambulation- I  Managing Meds- I  Follow up appointments reviewed:  PCP Hospital f/u appt confirmed? Yes  Scheduled to see.Ria Bush, [phone] 7:30 Specialist Hospital f/u appt confirmed? Yes  Scheduled to see .Darreld Mclean, PA-C  08/24/2022 at 2:45pm Are transportation arrangements needed? No  If their condition worsens, is the pt aware to call PCP or go to the Emergency Dept.? Yes Was the patient provided with contact information for the PCP's office or ED? Yes Was to pt encouraged to call back with questions or concerns? Yes  SDOH assessments and interventions completed:   Yes  Care Coordination Interventions Activated:  Yes   Care Coordination Interventions:  Referred for Care Coordination Services:  RN Care Coordinator   Quinn Plowman 09/04/2022 9:45  Encounter Outcome:  Pt. Visit  Completed    [facility] [phone]

## 2022-08-18 ENCOUNTER — Ambulatory Visit (INDEPENDENT_AMBULATORY_CARE_PROVIDER_SITE_OTHER): Payer: Medicare HMO | Admitting: Family Medicine

## 2022-08-18 ENCOUNTER — Telehealth: Payer: Self-pay | Admitting: Pharmacist

## 2022-08-18 ENCOUNTER — Encounter: Payer: Self-pay | Admitting: Family Medicine

## 2022-08-18 VITALS — BP 122/86 | HR 70 | Temp 97.7°F | Ht 72.0 in | Wt 223.5 lb

## 2022-08-18 DIAGNOSIS — E785 Hyperlipidemia, unspecified: Secondary | ICD-10-CM | POA: Diagnosis not present

## 2022-08-18 DIAGNOSIS — E1140 Type 2 diabetes mellitus with diabetic neuropathy, unspecified: Secondary | ICD-10-CM

## 2022-08-18 DIAGNOSIS — I4891 Unspecified atrial fibrillation: Secondary | ICD-10-CM | POA: Diagnosis not present

## 2022-08-18 DIAGNOSIS — I5031 Acute diastolic (congestive) heart failure: Secondary | ICD-10-CM | POA: Diagnosis not present

## 2022-08-18 DIAGNOSIS — E1136 Type 2 diabetes mellitus with diabetic cataract: Secondary | ICD-10-CM | POA: Diagnosis not present

## 2022-08-18 DIAGNOSIS — E1169 Type 2 diabetes mellitus with other specified complication: Secondary | ICD-10-CM

## 2022-08-18 DIAGNOSIS — J810 Acute pulmonary edema: Secondary | ICD-10-CM | POA: Diagnosis not present

## 2022-08-18 DIAGNOSIS — I5030 Unspecified diastolic (congestive) heart failure: Secondary | ICD-10-CM | POA: Insufficient documentation

## 2022-08-18 LAB — BASIC METABOLIC PANEL
BUN: 19 mg/dL (ref 6–23)
CO2: 26 mEq/L (ref 19–32)
Calcium: 9.4 mg/dL (ref 8.4–10.5)
Chloride: 103 mEq/L (ref 96–112)
Creatinine, Ser: 1.14 mg/dL (ref 0.40–1.50)
GFR: 61.9 mL/min (ref >60.00)
Glucose, Bld: 116 mg/dL — ABNORMAL HIGH (ref 70–99)
Potassium: 4.5 mEq/L (ref 3.5–5.1)
Sodium: 139 mEq/L (ref 135–145)

## 2022-08-18 LAB — LIPID PANEL
Cholesterol: 105 mg/dL (ref 0–200)
HDL: 38.6 mg/dL — ABNORMAL LOW (ref >39.00)
LDL Cholesterol: 34 mg/dL (ref 0–99)
NonHDL: 66.26
Total CHOL/HDL Ratio: 3
Triglycerides: 163 mg/dL — ABNORMAL HIGH (ref 0.0–149.0)
VLDL: 32.6 mg/dL (ref 0.0–40.0)

## 2022-08-18 NOTE — Telephone Encounter (Signed)
-----   Message from [place], [place] sent at 08/17/2022  3:49 PM EDT ----- Regarding: RE: call pt Spoke with wife and [facility].  She inquired about the Eliquis Kylin has been put on since hospital stay (and you just sent me info on income for Eliquis) which I gave her . She wanted me to let you know she felt like they would need help later on in the year.   [place]  ----- Message ----- From: Charlton Haws, [facility] Sent: 08/17/2022   3:06 PM EDT To: Avel Sensor, CMA Subject: call pt                                        Pt left me voicemail - she asked about paying for medication she got from hospital yesterday. It looks like his meds were filled at [facility], I think she needs to call them: [phone]

## 2022-08-18 NOTE — Assessment & Plan Note (Signed)
Will return to see eye doctor for endorsed blurry vision.

## 2022-08-18 NOTE — Assessment & Plan Note (Signed)
New diagnosis while hospitalized, now on lasix '20mg'$  daily. Discussed lasix dosing - continue daily for now with planned transition to PRN.

## 2022-08-18 NOTE — Assessment & Plan Note (Signed)
Stable period on metformin and ozempic.

## 2022-08-18 NOTE — Assessment & Plan Note (Signed)
Update FLP on rosuvastatin, fish oil.

## 2022-08-18 NOTE — Progress Notes (Signed)
Patient ID: Devin Huffman, male    DOB: 11/21/1946, 76 y.o.   MRN: [id number]  This visit was conducted in person.  BP 122/86   Pulse 70   Temp 97.7 F (36.5 C) (Temporal)   Ht 6' (1.829 m)   Wt 223 lb 8 oz (101.4 kg)   SpO2 96%   BMI 30.31 kg/m    CC: hosp f/u visit  Subjective:   HPI: Devin Huffman is a 76 y.o. male presenting on 08/18/2022 for Hospitalization Follow-up (Admitted on 08/13/22 at [facility], dx new onset afib; acute CHF.  Wants to discuss metoprolol dose change. Pt accompanied by wife, Hassan Rowan.  )   New subconjunctival hemorrhage to lower half of conjunctiva of R eye.   Recent hospitalization for afib with RVR complicated by acute pulmonary edema - presented with chest pressure, found to have afib as well as congestive heart failure (diastolic). Treated with diltiazem and metoprolol and eliquis. Echocardiogram overall reassuring. For CHF, started on lasix 55m daily.  Hospital records reviewed. Med rec performed.  Now on diltiazem 2440mdaily, metoprolol 2512mid, eliquis 5mg31md. Started on lasix 20mg65mly. Metformin XR was changed to IR 1000mg 61m  Amlodipine and Toprol XL discontinued.   Since home feeling well. No chest pain, palpitations, dyspnea, headache, dizziness, leg swelling.  He's been checking daily weight.  Planning to transition to PRN lasix.   Home health not set up.  Other follow up appointments scheduled: 08/24/2022 with CallieSande Rives____________________________________________________________________ Hospital admission: 08/13/2022 Hospital discharge: 08/16/2022 TCM f/u phone call: completed on 08/17/2022  Recommendations at discharge:   Follow up with PCP in 1-2 weeks Follow up with Cardiology as scheduled   Consultants: Cardiology  Discharge Diagnoses: Principal Problem:   Atrial fibrillation with RVR (HCC) A[place]mave Problems:   Dyslipidemia associated with type 2 diabetes mellitus (HCC)  [place] hypertension   Type 2 diabetes  mellitus with other specified complication (HCC)   Obesity, Class I, BMI 30-34.9   Type 2 diabetes mellitus with diabetic neuropathy, unspecified (HCC)   Acute respiratory failure with hypoxia (HCC)   Acute pulmonary edema (HCC)   Pleural effusion     Relevant past medical, surgical, family and social history reviewed and updated as indicated. Interim medical history since our last visit reviewed. Allergies and medications reviewed and updated. Outpatient Medications Prior to Visit  Medication Sig Dispense Refill   apixaban (ELIQUIS) 5 MG TABS tablet Take 1 tablet (5 mg total) by mouth 2 (two) times daily. 60 tablet 0   Ascorbic Acid (VITAMIN C) 1000 MG tablet Take 1,000 mg by mouth every evening.      aspirin EC 81 MG tablet Take 81 mg by mouth every evening.      blood glucose meter kit and supplies KIT Dispense based on patient and insurance preference. Use to check sugars up to twice daily as directed (E11.69). 1 each 0   Calcium Carbonate Antacid (TUMS CHEWY BITES PO) Take 2 tablets by mouth at bedtime as needed (reflux).     Cholecalciferol (VITAMIN D3) 25 MCG (1000 UT) CAPS Take 1,000 Units by mouth every evening.     Coenzyme Q10 (CO Q 10 PO) Take 1 capsule by mouth at bedtime.     Cyanocobalamin (VITAMIN B-12 PO) Take 1 capsule by mouth at bedtime.     diltiazem (CARDIZEM CD) 240 MG 24 hr capsule Take 1 capsule (240 mg total) by mouth daily. 30 capsule 0   furosemide (LASIX)  20 MG tablet Take 1 tablet (20 mg total) by mouth daily. 30 tablet 0   glucose blood (GLUCOSE METER TEST) test strip Use as instructed to check sugars twice daily as needed E11.69 100 each 12   metFORMIN (GLUCOPHAGE) 1000 MG tablet Take 1 tablet (1,000 mg total) by mouth 2 (two) times daily with a meal. 60 tablet 0   metoprolol tartrate (LOPRESSOR) 25 MG tablet Take 1 tablet (25 mg total) by mouth 2 (two) times daily. 60 tablet 0   Multiple Vitamins-Minerals (EYE VITAMINS PO) Take 2 tablets by mouth at bedtime.      Omega-3 Fatty Acids (FISH OIL PO) Take 1 capsule by mouth daily.     rosuvastatin (CRESTOR) 5 MG tablet TAKE ONE TABLET BY MOUTH ONCE DAILY (Patient taking differently: Take 5 mg by mouth at bedtime.) 90 tablet 1   Semaglutide,0.25 or 0.5MG/DOS, (OZEMPIC, 0.25 OR 0.5 MG/DOSE,) 2 MG/1.5ML SOPN Inject 0.5 mg into the skin once a week. 1.5 mL 6   No facility-administered medications prior to visit.     Per HPI unless specifically indicated in ROS section below Review of Systems  Objective:  BP 122/86   Pulse 70   Temp 97.7 F (36.5 C) (Temporal)   Ht 6' (1.829 m)   Wt 223 lb 8 oz (101.4 kg)   SpO2 96%   BMI 30.31 kg/m   Wt Readings from Last 3 Encounters:  08/18/22 223 lb 8 oz (101.4 kg)  08/16/22 221 lb 11.2 oz (100.6 kg)  07/31/22 232 lb (105.2 kg)      Physical Exam Vitals and nursing note reviewed.  Constitutional:      Appearance: Normal appearance. He is not ill-appearing.  HENT:     Head: Normocephalic and atraumatic.     Mouth/Throat:     Mouth: Mucous membranes are moist.     Pharynx: Oropharynx is clear. No oropharyngeal exudate or posterior oropharyngeal erythema.  Eyes:     Extraocular Movements: Extraocular movements intact.     Conjunctiva/sclera:     Right eye: Hemorrhage present.     Left eye: No hemorrhage. Cardiovascular:     Rate and Rhythm: Normal rate. Rhythm irregular.     Pulses: Normal pulses.     Heart sounds: Normal heart sounds. No murmur heard.    Comments: Mildly irregular - occasional skipped beats Pulmonary:     Effort: Pulmonary effort is normal. No respiratory distress.     Breath sounds: No wheezing, rhonchi or rales.     Comments: Bibasilar crackles Musculoskeletal:     Right lower leg: No edema.     Left lower leg: No edema.  Skin:    General: Skin is warm and dry.     Findings: No rash.  Neurological:     Mental Status: He is alert.  Psychiatric:        Mood and Affect: Mood normal.        Behavior: Behavior normal.        Lab Results  Component Value Date   HGBA1C 6.3 (H) 08/14/2022    Lab Results  Component Value Date   CREATININE 1.25 (H) 08/16/2022   BUN 16 08/16/2022   NA 139 08/16/2022   K 3.7 08/16/2022   CL 105 08/16/2022   CO2 23 08/16/2022    Lab Results  Component Value Date   WBC 12.3 (H) 08/14/2022   HGB 13.6 08/14/2022   HCT 40.3 08/14/2022   MCV 97.8 08/14/2022   PLT 223  08/14/2022    Lab Results  Component Value Date   TSH 3.429 08/14/2022    Lab Results  Component Value Date   CHOL 112 07/21/2021   HDL 40.90 07/21/2021   LDLCALC 47 07/05/2020   LDLDIRECT 45.0 07/21/2021   TRIG 236.0 (H) 07/21/2021   CHOLHDL 3 07/21/2021     Assessment & Plan:   Problem List Items Addressed This Visit     Dyslipidemia associated with type 2 diabetes mellitus (HCC)    Update FLP on rosuvastatin, fish oil.       Relevant Orders   Lipid panel   Basic Metabolic Panel   Type 2 diabetes mellitus with other specified complication (HCC)    Stable period on metformin and ozempic.       Type 2 diabetes mellitus with diabetic neuropathy, unspecified (HCC)    Stable period on metformin 1000mg bid - recent change from XR-->IR, also on ozempic. No longer on insulin.       Diabetic cataract of both eyes (HCC)    Will return to see eye doctor for endorsed blurry vision.       Atrial fibrillation with RVR (HCC) - Primary    New - now on eliquis, metoprolol, diltiazem, as well as lasix 20mg daily.  Discussed hospital course.  Has cardiology f/u planned for next week.  Continue current regimen.       Acute pulmonary edema (HCC)    Crackles on lung exam however he is asymptomatic from this standpoint.  Continue daily lasix for now, continue daily weights.       (HFpEF) heart failure with preserved ejection fraction (HCC)    New diagnosis while hospitalized, now on lasix 20mg daily. Discussed lasix dosing - continue daily for now with planned transition to PRN.         No  orders of the defined types were placed in this encounter.  Orders Placed This Encounter  Procedures   Lipid panel   Basic Metabolic Panel     Patient Instructions  Schedule eye exam.  Labs today. Return in 1-2 months for physical.  Continue current medicines including new blood thinner.  Let us know if any trouble with these. Keep appointment with cardiology next week.   Follow up plan: Return if symptoms worsen or fail to improve.  Javier Gutierrez, MD   

## 2022-08-18 NOTE — Telephone Encounter (Signed)
Patient may need help paying for Eliquis. Not clear if they will qualify for PAP. Set up CCM appt to discuss.

## 2022-08-18 NOTE — Assessment & Plan Note (Signed)
New - now on eliquis, metoprolol, diltiazem, as well as lasix '20mg'$  daily.  Discussed hospital course.  Has cardiology f/u planned for next week.  Continue current regimen.

## 2022-08-18 NOTE — Patient Instructions (Signed)
Schedule eye exam.  Labs today. Return in 1-2 months for physical.  Continue current medicines including new blood thinner.  Let us know if any trouble with these. Keep appointment with cardiology next week.

## 2022-08-18 NOTE — Assessment & Plan Note (Signed)
Crackles on lung exam however he is asymptomatic from this standpoint.  Continue daily lasix for now, continue daily weights.

## 2022-08-18 NOTE — Assessment & Plan Note (Addendum)
Stable period on metformin '1000mg'$  bid - recent change from XR-->IR, also on ozempic. No longer on insulin.

## 2022-08-21 ENCOUNTER — Telehealth: Payer: Self-pay | Admitting: Family Medicine

## 2022-08-21 DIAGNOSIS — K219 Gastro-esophageal reflux disease without esophagitis: Secondary | ICD-10-CM

## 2022-08-21 NOTE — Telephone Encounter (Signed)
Patient called in stating that he seen Dr. Darnell Level on Friday. He stated he forgot to mention that he has been experiencing acid reflux more frequently and was wondering if he could prescribed something for it. He stated he was taking something once before but couldn't remember the name of it.  Please advise. Thank you!

## 2022-08-21 NOTE — Progress Notes (Signed)
Cardiology Office Note:    Date:  08/24/2022   ID:  Devin Huffman, DOB 08-09-1946, MRN [id number]  PCP:  Ria Bush, MD  Cardiologist:  Minus Breeding, MD  Electrophysiologist:  None   Referring MD: Ria Bush, MD   Chief Complaint: hospital follow-up of CHF and atrial fibrillation  History of Present Illness:    Devin Huffman is a 76 y.o. male with a history of chronic diastolic CHF, paroxysmal atrial fibrillation on Eliquis, hypertension, hyperlipidemia, type 2 diabetes mellitus, and GERD who is followed by Dr. Percival Spanish and presents today for hospital follow-up of CHF and atrial fibrillation.   Patient was first seen by Cardiology during recent hospitalization. He was admitted from 08/13/2022 to 08/16/2022 with newly diagnosed diastolic CHF and newly diagnosed atrial fibrillation with RVR after presenting with shortness of breath and chest discomfort.  BNP was elevated at 439.  High-sensitivity troponin was negative.  Echo showed LVEF of 50-55% with mild LVH, mildly enlarged RV with mildly reduced systolic function and mildly elevated PASP.  He was diuresed with IV Lasix with good response and then transition to PO Lasix 20 mg daily.  He was also started on Eliquis, Lopressor, and Diltiazem for her atrial fibrillation and thankfully converted back to normal sinus rhythm.  Chest pain resolved with above treatment and was felt to be due to volume overload and atrial fibrillation with RVR.  No ischemic work-up was felt to be necessary.  Patient presents today for follow-up. Here with wife. Overall doing well since discharge. He reports some fatigue but he has had this since having 2 episodes of COVID (first one in 2020) and this is mostly unchanged. He denies any chest pain, significant shortness of breath, orthopnea, PND, edema, palpitations, lightheadedness, dizziness, syncope. He has what looks like a subconjunctival hemorrhage of his right eye which he noticed after leaving the  hospital. He also has some bilateral blurry vision but he states this was there before the subconjunctival hemorrhage. He is scheduled to see an eye doctor next month. No other abnormal bleeding.   Past Medical History:  Diagnosis Date   Cancer ([place])    skin cancer removed from nose   Diabetes mellitus without complication (HCC)    GERD (gastroesophageal reflux disease)    occasional   Gout    ?due to L great toe pain, saw Dr Gladstone Lighter   History of chicken pox    Hyperlipidemia    Hypertension    Legg-Perthes disease    Right hip   Nasal septal deviation    monitoring - [place]   Pneumonia due to COVID-19 virus 01/21/2020   Sepsis ([place])    In March 2018    Past Surgical History:  Procedure Laterality Date   COLONOSCOPY  03/2011   2 tubular adenomas, rec rpt 5 yrs   COLONOSCOPY  01/2018   diverticulosis, rpt 5 yrs [facility])   HERNIA REPAIR     double hernia   LUMBAR FUSION  04/2013   transforaminal interbody fusion L4/5 ([place]ki) for R L5 radiculopathy, L4/5 spondylolisthesis, and L4/5 HNP with compression of spinal cord   TONSILLECTOMY     TOTAL HIP ARTHROPLASTY Right 03/07/2017   Procedure: RIGHT TOTAL HIP ARTHROPLASTY ANTERIOR APPROACH;  Surgeon: Gaynelle Arabian, MD;  Location: [facility];  Service: Orthopedics;  Laterality: Right;   VASECTOMY     WISDOM TOOTH EXTRACTION      Current Medications: Current Meds  Medication Sig   apixaban (ELIQUIS) 5 MG TABS tablet  Take 1 tablet (5 mg total) by mouth 2 (two) times daily.   Ascorbic Acid (VITAMIN C) 1000 MG tablet Take 1,000 mg by mouth every evening.    blood glucose meter kit and supplies KIT Dispense based on patient and insurance preference. Use to check sugars up to twice daily as directed (E11.69).   Calcium Carbonate Antacid (TUMS CHEWY BITES PO) Take 2 tablets by mouth at bedtime as needed (reflux).   Cholecalciferol (VITAMIN D3) 25 MCG (1000 UT) CAPS Take 1,000 Units by mouth every evening.   Coenzyme Q10 (CO Q 10 PO) Take  1 capsule by mouth at bedtime.   Cyanocobalamin (VITAMIN B-12 PO) Take 1 capsule by mouth at bedtime.   diltiazem (CARDIZEM CD) 240 MG 24 hr capsule Take 1 capsule (240 mg total) by mouth daily.   furosemide (LASIX) 20 MG tablet Take 1 tablet (20 mg total) by mouth daily. (Patient taking differently: Take 20 mg by mouth as needed for fluid or edema (If you gain 3 pounds in a day 5 pounds in a week or lower exterimty swelling.).)   glucose blood (GLUCOSE METER TEST) test strip Use as instructed to check sugars twice daily as needed E11.69   metFORMIN (GLUCOPHAGE) 1000 MG tablet Take 1 tablet (1,000 mg total) by mouth 2 (two) times daily with a meal.   metoprolol tartrate (LOPRESSOR) 25 MG tablet Take 1 tablet (25 mg total) by mouth 2 (two) times daily.   Multiple Vitamins-Minerals (EYE VITAMINS PO) Take 2 tablets by mouth at bedtime.   Omega-3 Fatty Acids (FISH OIL PO) Take 1 capsule by mouth daily.   omeprazole (PRILOSEC) 40 MG capsule Take 1 capsule (40 mg total) by mouth daily as needed (reflux).   rosuvastatin (CRESTOR) 5 MG tablet TAKE ONE TABLET BY MOUTH ONCE DAILY (Patient taking differently: Take 5 mg by mouth at bedtime.)   Semaglutide,0.25 or 0.5MG/DOS, (OZEMPIC, 0.25 OR 0.5 MG/DOSE,) 2 MG/1.5ML SOPN Inject 0.5 mg into the skin once a week.   [DISCONTINUED] aspirin EC 81 MG tablet Take 81 mg by mouth every evening.      Allergies:   Lisinopril   Social History   Socioeconomic History   Marital status: Married    Spouse name: Not on file   Number of children: Not on file   Years of education: Not on file   Highest education level: Not on file  Occupational History   Occupation: part-time  Tobacco Use   Smoking status: Never   Smokeless tobacco: Never  Vaping Use   Vaping Use: Never used  Substance and Sexual Activity   Alcohol use: No   Drug use: No   Sexual activity: Yes  Other Topics Concern   Not on file  Social History Narrative   Caffeine: few cups/day   Lives  with wife, no pets   Occupation-Structural Museum/gallery conservator, "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  No regular exercise.   Diet: "I love my ice cream. I can't live forever", some water   Social Determinants of Health   Financial Resource Strain: Low Risk  (07/31/2022)   Overall Financial Resource Strain (CARDIA)    Difficulty of Paying Living Expenses: Not hard at all  Food Insecurity: No Food Insecurity (07/31/2022)   Hunger Vital Sign    Worried About Running Out of Food in the Last Year: Never true    Ran Out of Food in the Last Year: Never true  Transportation Needs:  No Transportation Needs (07/31/2022)   PRAPARE - Hydrologist (Medical): No    Lack of Transportation (Non-Medical): No  Physical Activity: Insufficiently Active (07/31/2022)   Exercise Vital Sign    Days of Exercise per Week: 3 days    Minutes of Exercise per Session: 30 min  Stress: No Stress Concern Present (07/31/2022)   [place]    Feeling of Stress : Not at all  Social Connections: Moderately Integrated (07/31/2022)   Social Connection and Isolation Panel [NHANES]    Frequency of Communication with Friends and Family: More than three times a week    Frequency of Social Gatherings with Friends and Family: More than three times a week    Attends Religious Services: More than 4 times per year    Active Member of [employer] or Organizations: No    Attends Music therapist: Never    Marital Status: Married     Family History: The patient's family history includes Alzheimer's disease in his father; Breast cancer in his mother; Cancer in his maternal aunt and another family member; Cervical cancer in his sister; Coronary artery disease in his paternal uncle; Esophageal cancer in his sister; Hypertension in his maternal uncle; Lung cancer (age of onset: 29) in his  sister; Stroke in his maternal grandfather and maternal uncle.  ROS:   Please see the history of present illness.     EKGs/Labs/Other Studies Reviewed:    The following studies were reviewed:  Echocardiogram 08/14/2022: Impressions: 1. Left ventricular ejection fraction, by estimation, is 50 to 55%. The  left ventricle has low normal function. Left ventricular endocardial  border not optimally defined to evaluate regional wall motion. There is  mild left ventricular hypertrophy. Left  ventricular diastolic parameters are indeterminate.   2. Right ventricular systolic function is mildly reduced. The right  ventricular size is mildly enlarged. There is mildly elevated pulmonary  artery systolic pressure. The estimated right ventricular systolic  pressure is 96.7 mmHg.   3. The mitral valve is grossly normal. Trivial mitral valve  regurgitation. No evidence of mitral stenosis.   4. The aortic valve is grossly normal. There is mild calcification of the  aortic valve. Aortic valve regurgitation is not visualized. No aortic  stenosis is present.   5. The inferior vena cava is dilated in size with >50% respiratory  variability, suggesting right atrial pressure of 8 mmHg.   EKG:  EKG ordered today. EKG personally reviewed and demonstrates normal sinus rhythm, rate 70 bpm, with 1st degree AV block and no acute ST/T changes. Left axis deviation. QTc 432 ms.  Recent Labs: 08/13/2022: B Natriuretic Peptide 439.4 08/14/2022: ALT 16; Hemoglobin 13.6; Platelets 223; TSH 3.429 08/15/2022: Magnesium 1.8 08/18/2022: BUN 19; Creatinine, Ser 1.14; Potassium 4.5; Sodium 139  Recent Lipid Panel    Component Value Date/Time   CHOL 105 08/18/2022 0808   TRIG 163.0 (H) 08/18/2022 0808   HDL 38.60 (L) 08/18/2022 0808   CHOLHDL 3 08/18/2022 0808   VLDL 32.6 08/18/2022 0808   LDLCALC 34 08/18/2022 0808   LDLDIRECT 45.0 07/21/2021 0743    Physical Exam:    Vital Signs: BP 136/68   Pulse 70   Ht 6'  (1.829 m)   Wt 227 lb 3.2 oz (103.1 kg)   SpO2 95%   BMI 30.81 kg/m     Wt Readings from Last 3 Encounters:  08/24/22 227 lb 3.2 oz (103.1 kg)  08/18/22 223 lb 8 oz (101.4 kg)  08/16/22 221 lb 11.2 oz (100.6 kg)     General: 76 y.o. Caucasian male in no acute distress. HEENT: Normocephalic and atraumatic. Likely subjectively hemorrhage of medial aspect of right eye. Neck: Supple.  No JVD. Heart: RRR. Distinct S1 and S2. No murmurs, gallops, or rubs.  Lungs: No increased work of breathing. Clear to ausculation bilaterally. No wheezes, rhonchi, or rales.  Abdomen: Soft, non-distended, and non-tender to palpation.  Extremities: No lower extremity edema.    Skin: Warm and dry. Neuro: Alert and oriented x3. No focal deficits. Psych: Normal affect. Responds appropriately.   Assessment:    1. Chronic diastolic CHF (congestive heart failure) (HCC)   2. Paroxysmal atrial fibrillation ([place])   3. Primary hypertension   4. Hyperlipidemia, unspecified hyperlipidemia type   5. Type 2 diabetes mellitus with obesity (HCC)   6. Subconjunctival hemorrhage of right eye     Plan:    Chronic Diastolic CHF Patient was recently admitted for newly diagnosed diastolic CHF.  Echo showed LVEF of 50-55% with mild LVH, mildly enlarged RV with mildly reduced systolic function and mildly elevated PASP. -Euvolemic on exam.  -Only taking Lasix 92m daily as needed and weights are stable on this. OK to continue PRN dosing for weight gain and worsening lower extremity edema.  -Initially was hoping to add Spironolactone or a SGTL2 inhibitor but patient does not want to be on any more medications at this time. -Discussed importance of daily weights and sodium/fluid restrictions.  Paroxysmal Atrial Fibrillation Patient was recently admitted with newly diagnosed atrial fibrillation with RVR.  He converted to sinus rhythm after initiation of rate control agents. -Maintaining sinus rhythm today. -Continue  Lopressor 25 mg twice daily. -Continue Cardizem CD 240 mg daily. -Continue chronic anticoagulant with Eliquis 5 mg twice daily. Can stop Aspirin now that he is on Eliquis.  Hypertension BP slightly above goal in the office today in the 130s/60s.  - Continue Lopressor and Cardizem CD as above. - He does not want to start any new medications at this time. Therefore, recommend he continue to monitor BP at home and to let [place] if consistently >130/80. If that happens, would consider adding Spironolactone.   Hyperlipidemia Recent lipid panel on 08/18/2022: Total Cholesterol 105, Triglycerides 163, HDL 38.60, LDL 34.  -Continue Crestor 55mdaily. -Managed by PCP.  Type 2 Diabetes Mellitus Hemoglobin A1c on 6.3% on 08/14/2022. -On Metformin and Ozempic.  -Managed by PCP.  Subconjunctival Hemorrhage  He has what looks like a subconjunctival hemorrhage of his right eye which he noticed after leaving the hospital. He also has some bilateral blurry vision but he states this was there before the subconjunctivial hemorrhage.  - He is scheduled to see an Ophthalmologist next month. Advised him to be seen sooner if this worsens, vision worsens, or he develops any eye pain.  Disposition: Follow up in 6 months.   Medication Adjustments/Labs and Tests Ordered: Current medicines are reviewed at length with the patient today.  Concerns regarding medicines are outlined above.  Orders Placed This Encounter  Procedures   EKG 12-Lead   No orders of the defined types were placed in this encounter.   Patient Instructions  Medication Instructions:  Your physician has recommended you make the following change in your medication: STOP: Aspirin  *If you need a refill on your cardiac medications before your next appointment, please call your pharmacy*   Lab Work: NONE ordered at this time of  appointment   If you have labs (blood work) drawn today and your tests are completely normal, you will receive  your results only by: [place] (if you have MyChart) OR A paper copy in the mail If you have any lab test that is abnormal or we need to change your treatment, we will call you to review the results.   Testing/Procedures: NONE ordered at this time of appointment   Follow-Up: At [facility], you and your health needs are our priority.  As part of our continuing mission to provide you with exceptional heart care, we have created designated Provider Care Teams.  These Care Teams include your primary Cardiologist (physician) and Advanced Practice Providers (APPs -  Physician Assistants and Nurse Practitioners) who all work together to provide you with the care you need, when you need it.  We recommend signing up for the patient portal called "MyChart".  Sign up information is provided on this After Visit Summary.  MyChart is used to connect with patients for Virtual Visits (Telemedicine).  Patients are able to view lab/test results, encounter notes, upcoming appointments, etc.  Non-urgent messages can be sent to your provider as well.   To learn more about what you can do with MyChart, go to [email or web address].    Your next appointment:   3 month(s)  The format for your next appointment:   In Person  Provider:   Sande Rives, PA-C or Minus Breeding, MD    Signed, Darreld Mclean, PA-C  08/24/2022 6:51 PM    [place]

## 2022-08-22 MED ORDER — OMEPRAZOLE 40 MG PO CPDR: 40.0000 mg | DELAYED_RELEASE_CAPSULE | Freq: Every day | ORAL | 3 refills | Status: DC | PRN

## 2022-08-22 NOTE — Telephone Encounter (Signed)
Patient notified as instructed by telephone and verbalized understanding. 

## 2022-08-22 NOTE — Addendum Note (Signed)
Addended by: Ria Bush on: 08/22/2022 08:18 AM   Modules accepted: Orders

## 2022-08-22 NOTE — Telephone Encounter (Signed)
I've sent in omeprazole '40mg'$  daily to take 30 min before largest meal. May take daily for 2-3 weeks then would use PRN reflux.  Also work on avoidance of citrus, fatty foods, chocolate, peppermint, and excessive alcohol, along with sodas, orange juice (acidic drinks) At least a few hours between dinner and bed, minimize naps after eating All this can help control reflux.  Let us know if not improving with above.

## 2022-08-24 ENCOUNTER — Ambulatory Visit: Payer: Medicare HMO | Attending: Student | Admitting: Student

## 2022-08-24 ENCOUNTER — Telehealth: Payer: Self-pay

## 2022-08-24 ENCOUNTER — Encounter: Payer: Self-pay | Admitting: Student

## 2022-08-24 VITALS — BP 136/68 | HR 70 | Ht 72.0 in | Wt 227.2 lb

## 2022-08-24 DIAGNOSIS — I480 Paroxysmal atrial fibrillation: Secondary | ICD-10-CM | POA: Diagnosis not present

## 2022-08-24 DIAGNOSIS — H1131 Conjunctival hemorrhage, right eye: Secondary | ICD-10-CM

## 2022-08-24 DIAGNOSIS — I5032 Chronic diastolic (congestive) heart failure: Secondary | ICD-10-CM

## 2022-08-24 DIAGNOSIS — E785 Hyperlipidemia, unspecified: Secondary | ICD-10-CM

## 2022-08-24 DIAGNOSIS — E669 Obesity, unspecified: Secondary | ICD-10-CM

## 2022-08-24 DIAGNOSIS — I10 Essential (primary) hypertension: Secondary | ICD-10-CM | POA: Diagnosis not present

## 2022-08-24 DIAGNOSIS — E1169 Type 2 diabetes mellitus with other specified complication: Secondary | ICD-10-CM | POA: Diagnosis not present

## 2022-08-24 NOTE — Progress Notes (Signed)
    Chronic Care Management Pharmacy Assistant   Name: Devin Huffman  MRN: [id number] DOB: 1946/05/21  Reason for Encounter: CCM (Appointment Reminder)  Medications: Outpatient Encounter Medications as of 08/24/2022  Medication Sig Note   apixaban (ELIQUIS) 5 MG TABS tablet Take 1 tablet (5 mg total) by mouth 2 (two) times daily.    Ascorbic Acid (VITAMIN C) 1000 MG tablet Take 1,000 mg by mouth every evening.     aspirin EC 81 MG tablet Take 81 mg by mouth every evening.     blood glucose meter kit and supplies KIT Dispense based on patient and insurance preference. Use to check sugars up to twice daily as directed (E11.69).    Calcium Carbonate Antacid (TUMS CHEWY BITES PO) Take 2 tablets by mouth at bedtime as needed (reflux).    Cholecalciferol (VITAMIN D3) 25 MCG (1000 UT) CAPS Take 1,000 Units by mouth every evening.    Coenzyme Q10 (CO Q 10 PO) Take 1 capsule by mouth at bedtime.    Cyanocobalamin (VITAMIN B-12 PO) Take 1 capsule by mouth at bedtime.    diltiazem (CARDIZEM CD) 240 MG 24 hr capsule Take 1 capsule (240 mg total) by mouth daily.    furosemide (LASIX) 20 MG tablet Take 1 tablet (20 mg total) by mouth daily.    glucose blood (GLUCOSE METER TEST) test strip Use as instructed to check sugars twice daily as needed E11.69    metFORMIN (GLUCOPHAGE) 1000 MG tablet Take 1 tablet (1,000 mg total) by mouth 2 (two) times daily with a meal.    metoprolol tartrate (LOPRESSOR) 25 MG tablet Take 1 tablet (25 mg total) by mouth 2 (two) times daily.    Multiple Vitamins-Minerals (EYE VITAMINS PO) Take 2 tablets by mouth at bedtime.    Omega-3 Fatty Acids (FISH OIL PO) Take 1 capsule by mouth daily.    omeprazole (PRILOSEC) 40 MG capsule Take 1 capsule (40 mg total) by mouth daily as needed (reflux).    rosuvastatin (CRESTOR) 5 MG tablet TAKE ONE TABLET BY MOUTH ONCE DAILY (Patient taking differently: Take 5 mg by mouth at bedtime.)    Semaglutide,0.25 or 0.5MG /DOS, (OZEMPIC, 0.25 OR  0.5 MG/DOSE,) 2 MG/1.5ML SOPN Inject 0.5 mg into the skin once a week. 08/13/2022: Pt is adamant he is taking this medication weekly.    [DISCONTINUED] lisinopril (PRINIVIL,ZESTRIL) 5 MG tablet Take 1 tablet (5 mg total) by mouth daily.    No facility-administered encounter medications on file as of 08/24/2022.   [name] was contacted to remind of upcoming telephone visit with Charlene Brooke on 08/29/22 at 9:30. Patient was reminded to have any blood glucose and blood pressure readings available for review at appointment.   Message was left reminding patient of appointment.  CCM referral has been placed prior to visit?  No   Star Rating Drugs: Medication:  Last Fill: Day Supply Metformin 1000 mg 08/16/2022 30 Rosuvastatin 5 mg 07/31/2022 90 Ozempic 0.5 mg PAP  Charlene Brooke, CPP notified  Marijean Niemann, [place] Clinical Pharmacy Assistant [phone]

## 2022-08-24 NOTE — Patient Instructions (Signed)
Medication Instructions:  Your physician has recommended you make the following change in your medication: STOP: Aspirin  *If you need a refill on your cardiac medications before your next appointment, please call your pharmacy*   Lab Work: NONE ordered at this time of appointment   If you have labs (blood work) drawn today and your tests are completely normal, you will receive your results only by: [place] (if you have MyChart) OR A paper copy in the mail If you have any lab test that is abnormal or we need to change your treatment, we will call you to review the results.   Testing/Procedures: NONE ordered at this time of appointment   Follow-Up: At [facility], you and your health needs are our priority.  As part of our continuing mission to provide you with exceptional heart care, we have created designated Provider Care Teams.  These Care Teams include your primary Cardiologist (physician) and Advanced Practice Providers (APPs -  Physician Assistants and Nurse Practitioners) who all work together to provide you with the care you need, when you need it.  We recommend signing up for the patient portal called "MyChart".  Sign up information is provided on this After Visit Summary.  MyChart is used to connect with patients for Virtual Visits (Telemedicine).  Patients are able to view lab/test results, encounter notes, upcoming appointments, etc.  Non-urgent messages can be sent to your provider as well.   To learn more about what you can do with MyChart, go to [email or web address].    Your next appointment:   3 month(s)  The format for your next appointment:   In Person  Provider:   Sande Rives, PA-C or Minus Breeding, MD

## 2022-08-29 ENCOUNTER — Other Ambulatory Visit: Payer: Self-pay | Admitting: Pharmacist

## 2022-08-29 ENCOUNTER — Ambulatory Visit: Payer: Medicare HMO | Admitting: Pharmacist

## 2022-08-29 DIAGNOSIS — I5030 Unspecified diastolic (congestive) heart failure: Secondary | ICD-10-CM

## 2022-08-29 DIAGNOSIS — E1169 Type 2 diabetes mellitus with other specified complication: Secondary | ICD-10-CM

## 2022-08-29 DIAGNOSIS — I10 Essential (primary) hypertension: Secondary | ICD-10-CM

## 2022-08-29 DIAGNOSIS — E1140 Type 2 diabetes mellitus with diabetic neuropathy, unspecified: Secondary | ICD-10-CM

## 2022-08-29 NOTE — Progress Notes (Signed)
Chronic Care Management Pharmacy Note  08/29/2022 Name:  Devin Huffman MRN:  [id number] DOB:  1946/02/12  Summary: CCM F/U visit -Reviewed new HF and Afib diagnoses; pt endorses compliance/tolerability with new medications (diltiazem, Eliquis) -HF: he is weighing himself daily and wt is stable 221-222 lbs, he was told to take Lasix for 3-4 lb wt gain and has not needed it yet -AFIB: diltiazem recently added to metoprolol, pt reports pulse 68 today -Reviewed Eliquis cost concerns - he will not qualify for patient assistance  Recommendations/Changes made from today's visit: -Advised to continue weighing daily and monitoring BP/HR daily; advised to contact provide with any HR < 55  Plan: -[place] will call patient 3 months to renew PAP -Pharmacist follow up televisit scheduled for 6 months -PCP appt 10/18/22 CPE    Subjective: Devin Huffman is an 76 y.o. year old male who is a primary patient of Ria Bush, MD.  The CCM team was consulted for assistance with disease management and care coordination needs.    Engaged with patient by telephone for follow up visit in response to provider referral for pharmacy case management and/or care coordination services.   Consent to Services:  The patient was given information about Chronic Care Management services, agreed to services, and gave verbal consent prior to initiation of services.  Please see initial visit note for detailed documentation.   Patient Care Team: Ria Bush, MD as PCP - General (Family Medicine) Minus Breeding, MD as PCP - Cardiology (Cardiology) Charlton Haws, [facility] as Pharmacist (Pharmacist)  Recent office visits: 08/18/22 Dr Danise Mina OV: hospital f/u- new dx Afib, HFpEF. Plan to transition lasix to PRN. Rx'd oemprazole 40 mg daily 2-3 wks then PRN for reflux.  02/15/22 Ria Bush, MD DM: A1C 7.2 Start: Trulcity 0.75 first month; 1.5 after Change: Metformin 1,500 mg vs 500 mg.  Stop Alpha-Lipoic Acid 600 mg. FU 6 months  Recent consult visits: 08/24/22 PA Sande Rives (Cardiology): new Afib and HFpEF. EF 50-55%. Continue PRN lasix. Discussed spiro or SGLT2 but pt denies additional meds. Stop aspirin now on Eliquis. BP goal < 130/80. F/U 6 months.  03/13/22 Allyn Kenner (Derm): Seborrheic Keratosis 02/02/22 Monna Fam (Ophthalmology): Nuclear Cataract, bilateral 11/23/21 Viviana Simpler, MD Video Visit: Covid positive Start: molnupiravir EUA ([place]) [place] capsule  Hospital visits: 08/13/22 - 08/16/22 admission: new onset Afib, acute CHF. Started diltiazem 240 mg, Eliquis 5 mg BID, metoprolol tartrate 25 mg BID (replace succinate).   Objective:  Lab Results  Component Value Date   CREATININE 1.14 08/18/2022   BUN 19 08/18/2022   GFR 61.90 08/18/2022   GFRNONAA 59 (L) 08/16/2022   GFRAA >60 01/27/2020   NA 139 08/18/2022   K 4.5 08/18/2022   CALCIUM 9.4 08/18/2022   CO2 26 08/18/2022   GLUCOSE 116 (H) 08/18/2022    Lab Results  Component Value Date/Time   HGBA1C 6.3 (H) 08/14/2022 01:43 AM   HGBA1C 7.2 (A) 02/15/2022 10:34 AM   HGBA1C 6.9 (H) 07/21/2021 07:43 AM   FRUCTOSAMINE 219 02/24/2020 08:10 AM   GFR 61.90 08/18/2022 08:08 AM   GFR 81.73 07/21/2021 07:43 AM   MICROALBUR 1.0 07/12/2020 11:41 AM   MICROALBUR 2.3 (H) 07/04/2019 09:07 AM    Last diabetic Eye exam:  Lab Results  Component Value Date/Time   HMDIABEYEEXA No Retinopathy 02/02/2022 12:00 AM    Last diabetic Foot exam: No results found for: "HMDIABFOOTEX"   Lab Results  Component Value Date  CHOL 105 08/18/2022   HDL 38.60 (L) 08/18/2022   LDLCALC 34 08/18/2022   LDLDIRECT 45.0 07/21/2021   TRIG 163.0 (H) 08/18/2022   CHOLHDL 3 08/18/2022       Latest Ref Rng & Units 08/15/2022    7:46 AM 08/14/2022    1:43 AM 07/21/2021    7:43 AM  Hepatic Function  Total Protein 6.5 - 8.1 g/dL  6.1  6.5   Albumin 3.5 - 5.0 g/dL 3.3  3.4  4.2   AST 15 - 41 U/L  13  16   ALT 0 -  44 U/L  16  22   Alk Phosphatase 38 - 126 U/L  48  64   Total Bilirubin 0.3 - 1.2 mg/dL  0.9  0.7     Lab Results  Component Value Date/Time   TSH 3.429 08/14/2022 01:43 AM   TSH 2.16 07/10/2019 10:18 AM   TSH 0.91 08/31/2016 03:39 PM       Latest Ref Rng & Units 08/14/2022    1:43 AM 08/13/2022   10:30 AM 02/24/2020    8:10 AM  CBC  WBC 4.0 - 10.5 K/uL 12.3  10.8  9.3   Hemoglobin 13.0 - 17.0 g/dL 13.6  13.2  14.4   Hematocrit 39.0 - 52.0 % 40.3  39.3  42.8   Platelets 150 - 400 K/uL 223  201  344.0     No results found for: "VD25OH"  Clinical ASCVD: No  The ASCVD Risk score (Arnett DK, et al., 2019) failed to calculate for the following reasons:   The valid total cholesterol range is 130 to 320 mg/dL    CHA2DS2/VAS Stroke Risk Points  Current as of yesterday     5 >= 2 Points: High Risk  1 - 1.99 Points: Medium Risk  0 Points: Low Risk    No Change     Points Metrics  1 Has Congestive Heart Failure:  Yes    Current as of yesterday  0 Has Vascular Disease:  No    Current as of yesterday  1 Has Hypertension:  Yes    Current as of yesterday  2 Age:  78    Current as of yesterday  1 Has Diabetes:  Yes    Current as of yesterday  0 Had Stroke:  No  Had TIA:  No  Had Thromboembolism:  No    Current as of yesterday  0 Male:  No    Current as of yesterday       07/31/2022    1:01 PM 06/02/2022    9:11 AM 07/14/2021    2:53 PM  Depression screen PHQ 2/9  Decreased Interest 0 0 0  Down, Depressed, Hopeless 0 0 0  PHQ - 2 Score 0 0 0  Altered sleeping 0  0  Tired, decreased energy 0  0  Change in appetite 0  0  Feeling bad or failure about yourself  0  0  Trouble concentrating 0  0  Moving slowly or fidgety/restless 0  0  Suicidal thoughts 0  0  PHQ-9 Score 0  0  Difficult doing work/chores Not difficult at all  Not difficult at all     Social History   Tobacco Use  Smoking Status Never  Smokeless Tobacco Never   BP Readings from Last 3 Encounters:   08/24/22 136/68  08/18/22 122/86  08/16/22 125/71   Pulse Readings from Last 3 Encounters:  08/24/22 70  08/18/22 70  08/16/22 71   Wt Readings from Last 3 Encounters:  08/24/22 227 lb 3.2 oz (103.1 kg)  08/18/22 223 lb 8 oz (101.4 kg)  08/16/22 221 lb 11.2 oz (100.6 kg)   BMI Readings from Last 3 Encounters:  08/24/22 30.81 kg/m  08/18/22 30.31 kg/m  08/16/22 30.07 kg/m    Assessment/Interventions: Review of patient past medical history, allergies, medications, health status, including review of consultants reports, laboratory and other test data, was performed as part of comprehensive evaluation and provision of chronic care management services.   SDOH:  (Social Determinants of Health) assessments and interventions performed: No - done Aug 2023 AWV SDOH Interventions    Flowsheet Row Clinical Support from 07/31/2022 in [place] at [place] from 07/14/2021 in [place] at [place] from 07/12/2020 in [place] at [place] [facility] from 06/17/2020 in [place] at [place] Interventions Intervention Not Indicated -- -- --  Housing Interventions Intervention Not Indicated -- -- --  Transportation Interventions Intervention Not Indicated -- -- --  Depression Interventions/Treatment  -- PHQ2-9 Score <4 Follow-up Not Indicated PHQ2-9 Score <4 Follow-up Not Indicated --  Financial Strain Interventions Intervention Not Indicated -- -- Intervention Not Indicated  Physical Activity Interventions Intervention Not Indicated -- -- --  Stress Interventions Intervention Not Indicated -- -- --  Social Connections Interventions Intervention Not Indicated -- -- --      SDOH Screenings   Food Insecurity: No Food Insecurity (07/31/2022)  Housing: Low Risk  (07/31/2022)  Transportation Needs: No Transportation Needs (07/31/2022)  Alcohol Screen: Low Risk   (07/31/2022)  Depression (PHQ2-9): Low Risk  (07/31/2022)  Financial Resource Strain: Low Risk  (07/31/2022)  Physical Activity: Insufficiently Active (07/31/2022)  Social Connections: Moderately Integrated (07/31/2022)  Stress: No Stress Concern Present (07/31/2022)  Tobacco Use: Low Risk  (09/04/2022)    CCM Care Plan  Allergies  Allergen Reactions   Lisinopril Hives and Swelling    angioedema - lip swelling, hives    Medications Reviewed Today     Reviewed by Dannielle Karvonen, RN (Registered Nurse) on 09/04/22 at [phone]  Med List Status: <None>   Medication Order Taking? Sig Documenting Provider Last Dose Status Informant  apixaban (ELIQUIS) 5 MG TABS tablet [id number] Yes Take 1 tablet (5 mg total) by mouth 2 (two) times daily. Ria Bush, MD Taking Active   Ascorbic Acid (VITAMIN C) 1000 MG tablet [id number] Yes Take 1,000 mg by mouth every evening.  [provider] Taking Active Spouse/Significant Other, Self, Pharmacy Records  blood glucose meter kit and supplies KIT [id number]  Dispense based on patient and insurance preference. Use to check sugars up to twice daily as directed (E11.69). Ria Bush, MD  Active Self, Spouse/Significant Other, Pharmacy Records  Calcium Carbonate Antacid (TUMS CHEWY BITES PO) [id number] Yes Take 2 tablets by mouth at bedtime as needed (reflux). [provider] Taking Active Self, Spouse/Significant Other, Pharmacy Records  Cholecalciferol (VITAMIN D3) 25 MCG (1000 UT) CAPS [id number] Yes Take 1,000 Units by mouth every evening. [provider] Taking Active Self, Spouse/Significant Other, Pharmacy Records  Coenzyme Q10 (CO Q 10 PO) [id number] Yes Take 1 capsule by mouth at bedtime. [provider] Taking Active Self, Spouse/Significant Other, Pharmacy Records  Cyanocobalamin (VITAMIN B-12 PO) [id number] Yes Take 1 capsule by mouth at bedtime. [provider] Taking Active Self, Spouse/Significant Other,  Pharmacy Records  diltiazem (CARDIZEM CD) 240  MG 24 hr capsule [id number] Yes Take 1 capsule (240 mg total) by mouth daily. Ria Bush, MD Taking Active   furosemide (LASIX) 20 MG tablet [id number] Yes Take 1 tablet (20 mg total) by mouth daily.  Patient taking differently: Take 20 mg by mouth as needed for fluid or edema (If you gain 3 pounds in a day 5 pounds in a week or lower exterimty swelling.).   Donne Hazel, MD Taking Active   glucose blood (GLUCOSE METER TEST) test strip [id number]  Use as instructed to check sugars twice daily as needed E11.69 Ria Bush, MD  Active Self, Spouse/Significant Other, Pharmacy Records  metFORMIN (GLUCOPHAGE) 1000 MG tablet [id number] Yes Take 1 tablet (1,000 mg total) by mouth 2 (two) times daily with a meal. Ria Bush, MD Taking Active   metoprolol tartrate (LOPRESSOR) 25 MG tablet [id number] Yes Take 1 tablet (25 mg total) by mouth 2 (two) times daily. Ria Bush, MD Taking Active   Multiple Vitamins-Minerals (EYE VITAMINS PO) [id number] Yes Take 2 tablets by mouth at bedtime. [provider] Taking Active Self, Spouse/Significant Other, Pharmacy Records  Omega-3 Fatty Acids (FISH OIL PO) [id number] Yes Take 1 capsule by mouth daily. [provider] Taking Active Self, Spouse/Significant Other, Pharmacy Records  omeprazole (PRILOSEC) 40 MG capsule [id number] Yes Take 1 capsule (40 mg total) by mouth daily as needed (reflux). Ria Bush, MD Taking Active   rosuvastatin (CRESTOR) 5 MG tablet [id number] Yes TAKE ONE TABLET BY MOUTH ONCE DAILY  Patient taking differently: Take 5 mg by mouth at bedtime.   Ria Bush, MD Taking Active Self, Spouse/Significant Other, Pharmacy Records  Semaglutide,0.25 or 0.5MG/DOS, (OZEMPIC, 0.25 OR 0.5 MG/DOSE,) 2 MG/1.5ML SOPN [id number] Yes Inject 0.5 mg into the skin once a week. Ria Bush, MD Taking Active Self, Spouse/Significant Other, Pharmacy Records            Med Note ([place]   Sun Aug 13, 2022  1:19 PM) Pt is adamant he is taking this medication weekly.             Patient Active Problem List   Diagnosis Date Noted   (HFpEF) heart failure with preserved ejection fraction ([place]) 08/18/2022   Atrial fibrillation with RVR ([place]) 08/13/2022   Acute pulmonary edema ([place]) 08/13/2022   Pleural effusion 08/13/2022   COVID-19 virus infection 11/23/2021   Diabetic cataract of both eyes ([place]) 07/12/2020   Posterior vitreous detachment 09/10/2019   Right wrist pain 10/13/2018   Health maintenance examination 05/28/2017   Skin rash 05/28/2017   History of right hip replacement 03/26/2017   OA (osteoarthritis) of hip 03/07/2017   Thoracic aortic ectasia ([place]) 02/22/2017   Type 2 diabetes mellitus with diabetic neuropathy, unspecified ([place]) 08/31/2016   Obesity, Class I, BMI 30-34.9 05/14/2015   Advanced care planning/counseling discussion 11/11/2014   Vitamin B12 deficiency 10/15/2012   Medicare annual wellness visit, subsequent 10/02/2011   GERD (gastroesophageal reflux disease) 10/02/2011   Dyslipidemia associated with type 2 diabetes mellitus ([place]) 07/15/2009   Gout 07/15/2009   Type 2 diabetes mellitus with other specified complication ([place]) 62/19/4712   Essential hypertension 06/23/2008    Immunization History  Administered Date(s) Administered   Fluad Quad(high Dose 65+) 01/12/2021   Influenza Split 10/15/2012   Influenza,inj,Quad PF,6+ Mos 11/11/2014, 11/17/2015, 08/31/2016, 11/28/2017, 10/21/2018   PFIZER(Purple Top)SARS-COV-2 Vaccination 04/19/2020, 05/11/2020   Pneumococcal Conjugate-13 11/11/2014   Pneumococcal Polysaccharide-23 10/15/2012   Td 12/12/1996, 03/22/2009, 07/12/2020   Zoster, Live 10/13/2013  Conditions to be addressed/monitored:  Hypertension, Hyperlipidemia, and Diabetes  Care Plan : [place] plan  Updates made by Charlton Haws, RPH since 09/04/2022 12:00 AM     Problem:  Hypertension, Hyperlipidemia, and Diabetes   Priority: High     Long-Range Goal: Disease mgmt   Start Date: 05/17/2022  Expected End Date: 05/18/2023  This Visit's Progress: On track  Recent Progress: On track  Priority: High  Note:   Current Barriers:  None identified  Pharmacist Clinical Goal(s):  Patient will contact provider office for questions/concerns as evidenced notation of same in electronic health record through collaboration with PharmD and provider.   Interventions: 1:1 collaboration with Ria Bush, MD regarding development and update of comprehensive plan of care as evidenced by provider attestation and co-signature Inter-disciplinary care team collaboration (see longitudinal plan of care) Comprehensive medication review performed; medication list updated in electronic medical record  Hypertension / Heart failure (BP goal <130/80) -Controlled -  BP at goal in clinic; new HF diagnosis in recent hospitalization 08/2022 -Last ejection fraction: 50-55% (Date: 08/2022 - new Dx) -HF type: Diastolic/Preserved EF; NYHA Class: not evaluated yet -Checking daily weight: 221-222 lb; pt has been instructed to take lasix for 3-4# wt gain -Current treatment: Metoprolol tartrate 25 mg BID - Appropriate, Effective, Safe, Accessible Diltiazem CD 240 mg daily - Appropriate, Effective, Safe, Accessible Furosemide 20 mg daily PRN - Appropriate, Effective, Safe, Accessible -Medications previously tried: lisinopril, amlodipine -Educated on BP goals and benefits of medications for prevention of heart attack, stroke and kidney damage; -Educated on heart failure diagnosis and clinical manifestations -Counseled to monitor BP and weight at home periodically -Recommended to continue current medication  Atrial Fibrillation (Goal: prevent stroke and major bleeding) -Controlled - pt reports more fatigue than before hospitalization -New dx 08/2022; CHADSVASC: 5 -Home BP and HR readings: pulse  68  -O2sat 94-95%  -Current treatment: Metoprolol tartrate 25 mg BID - Appropriate, Effective, Safe, Accessible Diltiazem CD 240 mg daily - Appropriate, Effective, Safe, Accessible Eliquis 5 mg BID -Appropriate, Effective, Safe, Accessible -Medications previously tried: n/a -Counseled on increased risk of stroke due to Afib and benefits of anticoagulation for stroke prevention; importance of adherence to anticoagulant exactly as prescribed; bleeding risk associated with Eliquis and importance of self-monitoring for signs/symptoms of bleeding; avoidance of NSAIDs due to increased bleeding risk with anticoagulants; -Assessed pt finances - he will not qualify for Eliquis PAP due to 3% OOP requirement -Recommended to continue current medication; advised to monitor BP/Puluse daily, contact provider if pulse < 55  Hyperlipidemia: (LDL goal < 70) -Controlled - LDL 34 (08/2022) at goal; pt affirms compliance as below -Current treatment: Rosuvastatin 5 mg daily -Appropriate, Effective, Safe, Accessible Coenezyme Q10 -Appropriate, Effective, Safe, Accessible OTC Omega 3 fish oil -Appropriate, Effective, Safe, Accessible -Medications previously tried: aspirin (stopped d/t Eliquis) -Educated on Cholesterol goals;  -Recommended to continue current medication  Diabetes (A1c goal <7%) -Controlled - A1c 6.3% (08/2022);  -Pt saw a nutritionist, he thinks it may help some -Current home glucose readings - checking 1-2x weekly fasting glucose: 135, 147 -Current medications: Ozempic 0.5 mg weekly (PAP) - Appropriate, Query Effective Metformin 1000 mg BID - Appropriate, Effective, Safe, Accessible -Medications previously tried: Trulicity (cost in donut hole), Novolog 70/30 -Educated on A1c and blood sugar goals; Benefits of routine self-monitoring of blood sugar; -Recommend to continue current medication  Patient Goals/Self-Care Activities Patient will:  - take medications as prescribed as evidenced by  patient report and record review  focus on medication adherence by routine check glucose daily, document, and provide at future appointments collaborate with provider on medication access solutions        Medication Assistance:  [place] PAP approved 2023  Compliance/Adherence/Medication fill history: Care Gaps: Urine microalbumin (due 07/12/21)  Star-Rating Drugs: Metformin - PDC 99% Rosuvastatin - PDC 99%  Medication Access: Within the past 30 days, how often has patient missed a dose of medication? 0 Is a pillbox or other method used to improve adherence? No  Factors that may affect medication adherence? nonadherence to medications (forgets) Are meds synced by current pharmacy? Yes  Are meds delivered by current pharmacy? Yes  Does patient experience delays in picking up medications due to transportation concerns? No   [facility] Reviewed: Is patient disadvantaged to use [facility]?: No  Current Rx insurance plan: [employer] [place] Name and location of Current pharmacy:  [facility] - [place], [place] - [address] [place] [address] Phone: [phone] Fax: [phone]  90-day Vials (last 08/03/22) Fish Oil 1000 mg -  1 a day morning CoQ 10 100 mg-  1  tablet daily  Vitamin C 1000 mg- 1 Tablet every evening Vitamin B12 1000 mcg - Take 1 tablet by mouth every morning  Amlodipine 10 mg - 1 tablet at bedtime Rosuvastatin 5 mg - 1 tablet daily Metoprolol 25 mg- 1 tablet daily Metformin 500 mg- 1 tablet every morning, 2 tablets every evening  Vitamin D3 50 Mcg- 1 capsule every evening   Care Plan and Follow Up Patient Decision:  Patient agrees to Care Plan and Follow-up.  Plan: Telephone follow up appointment with care management team member scheduled for:  6 months  Charlene Brooke, PharmD, BCACP Clinical Pharmacist [facility] at [facility] [phone]

## 2022-08-29 NOTE — Telephone Encounter (Signed)
Patient will need refills of medications prescribed at discharge 9/7/3. Patient prefers 90 day supplies:   Diltiazem 240 mg Metformin 1000 mg Metoprolol tartrate 25 mg Eliquis 5 mg   Preferred pharmacy: [facility] - [place], [place] - [address] [place] [address] Phone: [phone] Fax: [phone]  Routing to PCP for approval.

## 2022-08-30 MED ORDER — METFORMIN HCL 1000 MG PO TABS: 1000.0000 mg | ORAL_TABLET | Freq: Two times a day (BID) | ORAL | 0 refills | Status: DC

## 2022-08-30 MED ORDER — DILTIAZEM HCL ER COATED BEADS 240 MG PO CP24: 240.0000 mg | ORAL_CAPSULE | Freq: Every day | ORAL | 0 refills | Status: DC

## 2022-08-30 MED ORDER — APIXABAN 5 MG PO TABS
5.0000 mg | ORAL_TABLET | Freq: Two times a day (BID) | ORAL | 0 refills | Status: DC
Start: 2022-08-30 — End: 2022-10-18

## 2022-08-30 MED ORDER — METOPROLOL TARTRATE 25 MG PO TABS
25.0000 mg | ORAL_TABLET | Freq: Two times a day (BID) | ORAL | 0 refills | Status: DC
Start: 2022-08-30 — End: 2022-10-18

## 2022-09-04 ENCOUNTER — Ambulatory Visit: Payer: Self-pay

## 2022-09-04 NOTE — Patient Instructions (Signed)
Visit Information  Phone number for Pharmacist: [phone]   Goals Addressed   None     Care Plan : [place] plan  Updates made by Charlton Haws, RPH since 09/04/2022 12:00 AM     Problem: Hypertension, Hyperlipidemia, and Diabetes   Priority: High     Long-Range Goal: Disease mgmt   Start Date: 05/17/2022  Expected End Date: 05/18/2023  This Visit's Progress: On track  Recent Progress: On track  Priority: High  Note:   Current Barriers:  None identified  Pharmacist Clinical Goal(s):  Patient will contact provider office for questions/concerns as evidenced notation of same in electronic health record through collaboration with PharmD and provider.   Interventions: 1:1 collaboration with Ria Bush, MD regarding development and update of comprehensive plan of care as evidenced by provider attestation and co-signature Inter-disciplinary care team collaboration (see longitudinal plan of care) Comprehensive medication review performed; medication list updated in electronic medical record  Hypertension / Heart failure (BP goal <130/80) -Controlled -  BP at goal in clinic; new HF diagnosis in recent hospitalization 08/2022 -Last ejection fraction: 50-55% (Date: 08/2022 - new Dx) -HF type: Diastolic/Preserved EF; NYHA Class: not evaluated yet -Checking daily weight: 221-222 lb; pt has been instructed to take lasix for 3-4# wt gain -Current treatment: Metoprolol tartrate 25 mg BID - Appropriate, Effective, Safe, Accessible Diltiazem CD 240 mg daily - Appropriate, Effective, Safe, Accessible Furosemide 20 mg daily PRN - Appropriate, Effective, Safe, Accessible -Medications previously tried: lisinopril, amlodipine -Educated on BP goals and benefits of medications for prevention of heart attack, stroke and kidney damage; -Educated on heart failure diagnosis and clinical manifestations -Counseled to monitor BP and weight at home periodically -Recommended to  continue current medication  Atrial Fibrillation (Goal: prevent stroke and major bleeding) -Controlled - pt reports more fatigue than before hospitalization -New dx 08/2022; CHADSVASC: 5 -Home BP and HR readings: pulse 68  -O2sat 94-95%  -Current treatment: Metoprolol tartrate 25 mg BID - Appropriate, Effective, Safe, Accessible Diltiazem CD 240 mg daily - Appropriate, Effective, Safe, Accessible Eliquis 5 mg BID -Appropriate, Effective, Safe, Accessible -Medications previously tried: n/a -Counseled on increased risk of stroke due to Afib and benefits of anticoagulation for stroke prevention; importance of adherence to anticoagulant exactly as prescribed; bleeding risk associated with Eliquis and importance of self-monitoring for signs/symptoms of bleeding; avoidance of NSAIDs due to increased bleeding risk with anticoagulants; -Assessed pt finances - he will not qualify for Eliquis PAP due to 3% OOP requirement -Recommended to continue current medication; advised to monitor BP/Puluse daily, contact provider if pulse < 55  Hyperlipidemia: (LDL goal < 70) -Controlled - LDL 34 (08/2022) at goal; pt affirms compliance as below -Current treatment: Rosuvastatin 5 mg daily -Appropriate, Effective, Safe, Accessible Coenezyme Q10 -Appropriate, Effective, Safe, Accessible OTC Omega 3 fish oil -Appropriate, Effective, Safe, Accessible -Medications previously tried: aspirin (stopped d/t Eliquis) -Educated on Cholesterol goals;  -Recommended to continue current medication  Diabetes (A1c goal <7%) -Controlled - A1c 6.3% (08/2022);  -Pt saw a nutritionist, he thinks it may help some -Current home glucose readings - checking 1-2x weekly fasting glucose: 135, 147 -Current medications: Ozempic 0.5 mg weekly (PAP) - Appropriate, Query Effective Metformin 1000 mg BID - Appropriate, Effective, Safe, Accessible -Medications previously tried: Trulicity (cost in donut hole), Novolog 70/30 -Educated on A1c  and blood sugar goals; Benefits of routine self-monitoring of blood sugar; -Recommend to continue current medication  Patient Goals/Self-Care Activities Patient will:  - take medications as prescribed  as evidenced by patient report and record review focus on medication adherence by routine check glucose daily, document, and provide at future appointments collaborate with provider on medication access solutions      The patient verbalized understanding of instructions, educational materials, and care plan provided today and DECLINED offer to receive copy of patient instructions, educational materials, and care plan.  Telephone follow up appointment with pharmacy team member scheduled for: 6 months  Charlene Brooke, PharmD, [facility] Clinical Pharmacist [place] [facility] [phone]

## 2022-09-04 NOTE — Patient Instructions (Signed)
Visit Information  Thank you for taking time to visit with me today. Please don't hesitate to contact me if I can be of assistance to you.   Following are the goals we discussed today:   Goals Addressed             This Visit's Progress    Patient states: Managing my health conditions       Care Coordination Interventions: Reviewed Heart Failure Action Plan in depth and provided written copy Assessed need for readable accurate scales in home Discussed importance of daily weight and advised patient to weigh and record daily Reviewed role of diuretics in prevention of fluid overload and management of heart failure; Discussed the importance of keeping all appointments with provider Advised to notify provider for a 3 lbs weight gain over night or 5 lbs in a week.   Advised to monitor blood pressure, pulse and oxygen level daily and record.  Advised to take medications as prescribed and refill timely Education article on heart failure and atrial fibrillation mailed to patient.  Advised patient to take his blood pressure monitor to his primary care provider visit for calibration.           Our next appointment is by telephone on 09/29/22 at 10 am  Please call the care guide team at [phone] if you need to cancel or reschedule your appointment.   If you are experiencing a Mental Health or [place] or need someone to talk to, please call 1-800-273-TALK (toll free, 24 hour hotline)  The patient verbalized understanding of instructions, educational materials, and care plan provided today and agreed to receive a mailed copy of patient instructions, educational materials, and care plan.   Quinn Plowman RN,BSN,CCM [place] [facility] [phone] direct line

## 2022-09-04 NOTE — Patient Outreach (Signed)
  Care Coordination   Follow Up Visit Note   09/04/2022 Name: MING MCMANNIS MRN: [id number] DOB: [birth year]  CLAYTON BOSSERMAN is a 76 y.o. year old male who sees Ria Bush, MD for primary care. I spoke with  [name] by phone today.  What matters to the patients health and wellness today?  Patient newly diagnosed with Heart failure and atrial fibrillation during hospitalization from 08/13/22 to 08/16/22.  Patient reports he weighs in the morning daily.  He reports taking his medications as prescribed. Patient reports periodically monitoring his pulse/ oxygen. Patient states he checks his blood pressure but is unsure if blood pressure monitor is working correctly. Per chart review patients next follow up visit with his primary care provider is 10/18/22 and cardiologist on 11/13/22.    Goals Addressed             This Visit's Progress    Patient states: Managing my health conditions       Care Coordination Interventions: Reviewed Heart Failure Action Plan in depth and provided written copy Assessed need for readable accurate scales in home Discussed importance of daily weight and advised patient to weigh and record daily Reviewed role of diuretics in prevention of fluid overload and management of heart failure; Discussed the importance of keeping all appointments with provider Advised to notify provider for a 3 lbs weight gain over night or 5 lbs in a week.   Advised to monitor blood pressure, pulse and oxygen level daily and record.  Advised to take medications as prescribed and refill timely Education article on heart failure and atrial fibrillation mailed to patient.  Advised patient to take his blood pressure monitor to his primary care provider visit for calibration.           SDOH assessments and interventions completed:  No     Care Coordination Interventions Activated:  Yes  Care Coordination Interventions:  Yes, provided   Follow up plan: Follow up call  scheduled for 09/29/22 at 10 am    Encounter Outcome:  Pt. Visit Completed   Quinn Plowman RN,BSN,CCM [place] [phone] direct line

## 2022-09-08 ENCOUNTER — Telehealth: Payer: Self-pay

## 2022-09-08 NOTE — Telephone Encounter (Signed)
Patient picked up medication

## 2022-09-08 NOTE — Telephone Encounter (Signed)
Noted  

## 2022-09-08 NOTE — Telephone Encounter (Signed)
Received pt's Ozempic 0.25, 0.5 mg dose shipment (5 bxs total) today.   Spoke with pt notifying him of medication delivery.  Verbalizes understanding.    [Placed Ozempic (5 bxs total) in 2nd refrigerator, 5th shelf.]

## 2022-09-29 ENCOUNTER — Ambulatory Visit: Payer: Self-pay

## 2022-09-29 NOTE — Patient Outreach (Signed)
  Care Coordination   Follow Up Visit Note   09/29/2022 Name: Devin Huffman MRN: [id number] DOB: [birth year]  Devin Huffman is a 76 y.o. year old male who sees Devin Bush, MD for primary care. I spoke with  Devin Huffman by phone today.  What matters to the patients health and wellness today?  Patient states he is doing well. He denies any heart failure/ atrial fibrillation symptoms.  He states he is pulse is ranging from 60's-70's.  Patient denies any changes with medications and states he continues to take them as prescribed.  Patient states he is away on vacation and has not weighed or checked blood pressure.  Patient states he does not have a lot of time to talk and request call back after returning from vacation.     Goals Addressed             This Visit's Progress    Patient states: Managing my health conditions       Care Coordination Interventions: Assessed for heart failure / atrial fibrillation symptoms Reviewed medications and discussed importance of compliance Advised to reports any increase in symptoms to provider as soon as possible. Call 911 for severe symptoms. Marland Kitchen           SDOH assessments and interventions completed:  No     Care Coordination Interventions Activated:  Yes  Care Coordination Interventions:  Yes, provided   Follow up plan: Follow up call scheduled for 10/20/22 at 10:30 am    Encounter Outcome:  Pt. Visit Completed {THN Tip this will not be part of the note when signed-REQUIRED REPORT FIELD DO NOT DELETE (Optional):[id number]  Devin Plowman RN,BSN,CCM [place] [phone] direct line

## 2022-10-18 ENCOUNTER — Encounter: Payer: Self-pay | Admitting: Family Medicine

## 2022-10-18 ENCOUNTER — Ambulatory Visit (INDEPENDENT_AMBULATORY_CARE_PROVIDER_SITE_OTHER): Payer: Medicare HMO | Admitting: Family Medicine

## 2022-10-18 VITALS — BP 136/70 | HR 63 | Temp 97.4°F | Ht 69.5 in | Wt 220.6 lb

## 2022-10-18 DIAGNOSIS — I10 Essential (primary) hypertension: Secondary | ICD-10-CM

## 2022-10-18 DIAGNOSIS — E538 Deficiency of other specified B group vitamins: Secondary | ICD-10-CM | POA: Diagnosis not present

## 2022-10-18 DIAGNOSIS — Z7189 Other specified counseling: Secondary | ICD-10-CM

## 2022-10-18 DIAGNOSIS — Z0000 Encounter for general adult medical examination without abnormal findings: Secondary | ICD-10-CM

## 2022-10-18 DIAGNOSIS — E669 Obesity, unspecified: Secondary | ICD-10-CM | POA: Diagnosis not present

## 2022-10-18 DIAGNOSIS — K219 Gastro-esophageal reflux disease without esophagitis: Secondary | ICD-10-CM | POA: Diagnosis not present

## 2022-10-18 DIAGNOSIS — E785 Hyperlipidemia, unspecified: Secondary | ICD-10-CM

## 2022-10-18 DIAGNOSIS — E1136 Type 2 diabetes mellitus with diabetic cataract: Secondary | ICD-10-CM | POA: Diagnosis not present

## 2022-10-18 DIAGNOSIS — I5032 Chronic diastolic (congestive) heart failure: Secondary | ICD-10-CM | POA: Diagnosis not present

## 2022-10-18 DIAGNOSIS — E1140 Type 2 diabetes mellitus with diabetic neuropathy, unspecified: Secondary | ICD-10-CM | POA: Diagnosis not present

## 2022-10-18 DIAGNOSIS — E1169 Type 2 diabetes mellitus with other specified complication: Secondary | ICD-10-CM

## 2022-10-18 DIAGNOSIS — I4891 Unspecified atrial fibrillation: Secondary | ICD-10-CM

## 2022-10-18 LAB — MICROALBUMIN / CREATININE URINE RATIO
Creatinine,U: 186.4 mg/dL
Microalb Creat Ratio: 1.9 mg/g (ref 0.0–30.0)
Microalb, Ur: 3.5 mg/dL — ABNORMAL HIGH (ref 0.0–1.9)

## 2022-10-18 MED ORDER — APIXABAN 5 MG PO TABS: 5.0000 mg | ORAL_TABLET | Freq: Two times a day (BID) | ORAL | 3 refills | Status: DC

## 2022-10-18 MED ORDER — DILTIAZEM HCL ER COATED BEADS 240 MG PO CP24: 240.0000 mg | ORAL_CAPSULE | Freq: Every day | ORAL | 3 refills | Status: DC

## 2022-10-18 MED ORDER — OMEPRAZOLE 40 MG PO CPDR: 40.0000 mg | DELAYED_RELEASE_CAPSULE | Freq: Every day | ORAL | 6 refills | Status: DC | PRN

## 2022-10-18 MED ORDER — ROSUVASTATIN CALCIUM 5 MG PO TABS: 5.0000 mg | ORAL_TABLET | Freq: Every day | ORAL | 3 refills | Status: DC

## 2022-10-18 MED ORDER — OZEMPIC (0.25 OR 0.5 MG/DOSE) 2 MG/1.5ML ~~LOC~~ SOPN: 0.5000 mg | PEN_INJECTOR | SUBCUTANEOUS | 3 refills | Status: DC

## 2022-10-18 MED ORDER — METFORMIN HCL 1000 MG PO TABS: 1000.0000 mg | ORAL_TABLET | Freq: Two times a day (BID) | ORAL | 3 refills | Status: DC

## 2022-10-18 MED ORDER — METOPROLOL TARTRATE 25 MG PO TABS
25.0000 mg | ORAL_TABLET | Freq: Two times a day (BID) | ORAL | 3 refills | Status: DC
Start: 2022-10-18 — End: 2023-05-29

## 2022-10-18 NOTE — Assessment & Plan Note (Signed)
Levels at goal on daily replacement.

## 2022-10-18 NOTE — Assessment & Plan Note (Signed)
Chronic, stable, continue current regimen. 

## 2022-10-18 NOTE — Assessment & Plan Note (Addendum)
Chronic, stable on current regimen of dilt, metoprolol, appreciate cards care. Has not needed lasix recently.

## 2022-10-18 NOTE — Patient Instructions (Addendum)
Urine test today.  Send [place] date of first shingles shot to update your chart.  Return in 6 months for diabetes follow up visit   Health Maintenance After Age 78 After age 18, you are at a higher risk for certain long-term diseases and infections as well as injuries from falls. Falls are a major cause of broken bones and head injuries in people who are older than age 13. Getting regular preventive care can help to keep you healthy and well. Preventive care includes getting regular testing and making lifestyle changes as recommended by your health care provider. Talk with your health care provider about: Which screenings and tests you should have. A screening is a test that checks for a disease when you have no symptoms. A diet and exercise plan that is right for you. What should I know about screenings and tests to prevent falls? Screening and testing are the best ways to find a health problem early. Early diagnosis and treatment give you the best chance of managing medical conditions that are common after age 21. Certain conditions and lifestyle choices may make you more likely to have a fall. Your health care provider may recommend: Regular vision checks. Poor vision and conditions such as cataracts can make you more likely to have a fall. If you wear glasses, make sure to get your prescription updated if your vision changes. Medicine review. Work with your health care provider to regularly review all of the medicines you are taking, including over-the-counter medicines. Ask your health care provider about any side effects that may make you more likely to have a fall. Tell your health care provider if any medicines that you take make you feel dizzy or sleepy. Strength and balance checks. Your health care provider may recommend certain tests to check your strength and balance while standing, walking, or changing positions. Foot health exam. Foot pain and numbness, as well as not wearing proper footwear,  can make you more likely to have a fall. Screenings, including: Osteoporosis screening. Osteoporosis is a condition that causes the bones to get weaker and break more easily. Blood pressure screening. Blood pressure changes and medicines to control blood pressure can make you feel dizzy. Depression screening. You may be more likely to have a fall if you have a fear of falling, feel depressed, or feel unable to do activities that you used to do. Alcohol use screening. Using too much alcohol can affect your balance and may make you more likely to have a fall. Follow these instructions at home: Lifestyle Do not drink alcohol if: Your health care provider tells you not to drink. If you drink alcohol: Limit how much you have to: 0-1 drink a day for women. 0-2 drinks a day for men. Know how much alcohol is in your drink. In the [place] one drink equals one 12 oz bottle of beer (355 mL), one 5 oz glass of wine (148 mL), or one 1 oz glass of hard liquor (44 mL). Do not use any products that contain nicotine or tobacco. These products include cigarettes, chewing tobacco, and vaping devices, such as e-cigarettes. If you need help quitting, ask your health care provider. Activity  Follow a regular exercise program to stay fit. This will help you maintain your balance. Ask your health care provider what types of exercise are appropriate for you. If you need a cane or walker, use it as recommended by your health care provider. Wear supportive shoes that have nonskid soles. Safety  Remove  any tripping hazards, such as rugs, cords, and clutter. Install safety equipment such as grab bars in bathrooms and safety rails on stairs. Keep rooms and walkways well-lit. General instructions Talk with your health care provider about your risks for falling. Tell your health care provider if: You fall. Be sure to tell your health care provider about all falls, even ones that seem minor. You feel dizzy, tiredness  (fatigue), or off-balance. Take over-the-counter and prescription medicines only as told by your health care provider. These include supplements. Eat a healthy diet and maintain a healthy weight. A healthy diet includes low-fat dairy products, low-fat (lean) meats, and fiber from whole grains, beans, and lots of fruits and vegetables. Stay current with your vaccines. Schedule regular health, dental, and eye exams. Summary Having a healthy lifestyle and getting preventive care can help to protect your health and wellness after age 11. Screening and testing are the best way to find a health problem early and help you avoid having a fall. Early diagnosis and treatment give you the best chance for managing medical conditions that are more common for people who are older than age 52. Falls are a major cause of broken bones and head injuries in people who are older than age 52. Take precautions to prevent a fall at home. Work with your health care provider to learn what changes you can make to improve your health and wellness and to prevent falls. This information is not intended to replace advice given to you by your health care provider. Make sure you discuss any questions you have with your health care provider. Document Revised: 04/18/2021 Document Reviewed: 04/18/2021 Elsevier Patient Education  [place].

## 2022-10-18 NOTE — Assessment & Plan Note (Signed)
Preventative protocols reviewed and updated unless pt declined. Discussed healthy diet and lifestyle.  

## 2022-10-18 NOTE — Assessment & Plan Note (Addendum)
Chronic, overall stable on crestor and fish oil. The ASCVD Risk score (Arnett DK, et al., 2019) failed to calculate for the following reasons:   The valid total cholesterol range is 130 to 320 mg/dL

## 2022-10-18 NOTE — Assessment & Plan Note (Signed)
Encouraged healthy diet and lifestyle.

## 2022-10-18 NOTE — Assessment & Plan Note (Signed)
Advanced directives: does not want prolonged life support. [place] for reversible cause. Wife would be HCPOA. Advanced directive packet provided today.

## 2022-10-18 NOTE — Assessment & Plan Note (Signed)
Chronic, stable on latest A1c. Now on ozempic in addition to metformin.

## 2022-10-18 NOTE — Assessment & Plan Note (Signed)
Stable period on PRN PPI

## 2022-10-18 NOTE — Assessment & Plan Note (Signed)
Planned eye doctor appt next week.

## 2022-10-18 NOTE — Progress Notes (Signed)
Patient ID: GLADE STRAUSSER, male    DOB: [birth year], 76 y.o.   MRN: [id number]  This visit was conducted in person.  BP 136/70   Pulse 63   Temp (!) 97.4 F (36.3 C) (Temporal)   Ht 5' 9.5" (1.765 m)   Wt 220 lb 9.6 oz (100.1 kg)   SpO2 94%   BMI 32.11 kg/m    CC: CPE Subjective:   HPI: TREI SCHOCH is a 76 y.o. male presenting on 10/18/2022 for Annual Exam [facility] prt 2.  Pt accompanied by wife, Hassan Rowan. )   Saw health advisor 07/2022 for medicare wellness visit. Note reviewed.   No results found.  Flowsheet Row Clinical Support from 07/31/2022 in [facility] [facility]  PHQ-2 Total Score 0          08/17/2022   10:10 AM 07/31/2022    1:05 PM 07/04/2022   10:31 AM 06/02/2022    9:10 AM 07/14/2021    2:52 PM  Fall Risk   Falls in the past year? 0 0 0 0 0  Number falls in past yr: 0 0   0  Injury with Fall? 0 0   0  Risk for fall due to : No Fall Risks No Fall Risks   Medication side effect  Follow up Falls evaluation completed Falls evaluation completed   Falls evaluation completed;Falls prevention discussed   New afib and dCHF diagnosis s/p hospitalization 08/2022, now on eliquis and diltiazem. Has not needed to take lasix.  Brings cbg log - fasting 130-150.  Upper legs tire out with walking 80 feet.   Preventative: COLONOSCOPY 01/2018 diverticulosis, rpt 5 yrs [place] Plan) Prostate cancer screening - normal in the past. We have stopped screening. No fmhx prostate cancer.  Lung cancer screening - not eligible  Flu shot yearly [facility] 04/2020, 05/2020, no booster Tetanus 2010, Td 07/2020 Pneumovax 2013, prevnar-13 2015  zostavax 10/13/2013   Shingrix - completed at pharmacy (09/2022) Advanced directives: does not want prolonged life support. [place] for reversible cause. Wife would be HCPOA. Advanced directive packet provided today.  Seat belt use discussed Sunscreen use discussed. No changing moles on skin. 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 No regular exercise.   Diet: some water, enjoys sweets      Relevant past medical, surgical, family and social history reviewed and updated as indicated. Interim medical history since our last visit reviewed. Allergies and medications reviewed and updated. Outpatient Medications Prior to Visit  Medication Sig Dispense Refill   Ascorbic Acid (VITAMIN C) 1000 MG tablet Take 1,000 mg by mouth every evening.      blood glucose meter kit and supplies KIT Dispense based on patient and insurance preference. Use to check sugars up to twice daily as directed (E11.69). 1 each 0   Calcium Carbonate Antacid (TUMS CHEWY BITES PO) Take 2 tablets by mouth at bedtime as needed (reflux).     Cholecalciferol (VITAMIN D3) 25 MCG (1000 UT) CAPS Take 1,000 Units by mouth every evening.     Coenzyme Q10 (CO Q 10 PO) Take 1 capsule by mouth at bedtime.     Cyanocobalamin (VITAMIN  B-12 PO) Take 1 capsule by mouth at bedtime.     glucose blood (GLUCOSE METER TEST) test strip Use as instructed to check sugars twice daily as needed E11.69 100 each 12   Multiple Vitamins-Minerals (EYE VITAMINS PO) Take 2 tablets by mouth at bedtime.     Omega-3 Fatty Acids (FISH OIL PO) Take 1 capsule by mouth daily.     apixaban (ELIQUIS) 5 MG TABS tablet Take 1 tablet (5 mg total) by mouth 2 (two) times daily. 180 tablet 0   diltiazem (CARDIZEM CD) 240 MG 24 hr capsule Take 1 capsule (240 mg total) by mouth daily. 90 capsule 0   metFORMIN (GLUCOPHAGE) 1000 MG tablet Take 1 tablet (1,000 mg total) by mouth 2 (two) times daily with a meal. 180 tablet 0   metoprolol tartrate (LOPRESSOR) 25 MG tablet Take 1 tablet (25 mg total) by mouth  2 (two) times daily. 180 tablet 0   omeprazole (PRILOSEC) 40 MG capsule Take 1 capsule (40 mg total) by mouth daily as needed (reflux). 30 capsule 3   rosuvastatin (CRESTOR) 5 MG tablet TAKE ONE TABLET BY MOUTH ONCE DAILY (Patient taking differently: Take 5 mg by mouth at bedtime.) 90 tablet 1   Semaglutide,0.25 or 0.5MG/DOS, (OZEMPIC, 0.25 OR 0.5 MG/DOSE,) 2 MG/1.5ML SOPN Inject 0.5 mg into the skin once a week. 1.5 mL 6   furosemide (LASIX) 20 MG tablet Take 1 tablet (20 mg total) by mouth daily. (Patient taking differently: Take 20 mg by mouth as needed for fluid or edema (If you gain 3 pounds in a day 5 pounds in a week or lower exterimty swelling.).) 30 tablet 0   No facility-administered medications prior to visit.     Per HPI unless specifically indicated in ROS section below Review of Systems  Constitutional:  Positive for fatigue. Negative for activity change, appetite change, chills, fever and unexpected weight change.       Notes he tires out easily  HENT:  Negative for hearing loss.   Eyes:  Negative for visual disturbance.  Respiratory:  Negative for cough, chest tightness, shortness of breath and wheezing.   Cardiovascular:  Negative for chest pain, palpitations and leg swelling.  Gastrointestinal:  Negative for abdominal distention, abdominal pain, blood in stool, constipation, diarrhea, nausea and vomiting.  Genitourinary:  Negative for difficulty urinating and hematuria.  Musculoskeletal:  Negative for arthralgias, myalgias and neck pain.  Skin:  Negative for rash.  Neurological:  Negative for dizziness, seizures, syncope and headaches.  Hematological:  Negative for adenopathy. Does not bruise/bleed easily.  Psychiatric/Behavioral:  Negative for dysphoric mood. The patient is not nervous/anxious.     Objective:  BP 136/70   Pulse 63   Temp (!) 97.4 F (36.3 C) (Temporal)   Ht 5' 9.5" (1.765 m)   Wt 220 lb 9.6 oz (100.1 kg)   SpO2 94%   BMI 32.11 kg/m   Wt Readings  from Last 3 Encounters:  10/18/22 220 lb 9.6 oz (100.1 kg)  08/24/22 227 lb 3.2 oz (103.1 kg)  08/18/22 223 lb 8 oz (101.4 kg)      Physical Exam Vitals and nursing note reviewed.  Constitutional:      General: He is not in acute distress.    Appearance: Normal appearance. He is well-developed. He is not ill-appearing.  HENT:     Head: Normocephalic and atraumatic.     Right Ear: Hearing, tympanic membrane, ear canal and external ear normal.     Left Ear:  Hearing, tympanic membrane, ear canal and external ear normal.     Nose: Nose normal.     Mouth/Throat:     Mouth: Mucous membranes are moist.     Pharynx: Oropharynx is clear. No oropharyngeal exudate or posterior oropharyngeal erythema.  Eyes:     General: No scleral icterus.    Extraocular Movements: Extraocular movements intact.     Conjunctiva/sclera: Conjunctivae normal.     Pupils: Pupils are equal, round, and reactive to light.  Neck:     Thyroid: No thyroid mass or thyromegaly.     Vascular: No carotid bruit.  Cardiovascular:     Rate and Rhythm: Normal rate and regular rhythm.     Pulses: Normal pulses.          Radial pulses are 2+ on the right side and 2+ on the left side.     Heart sounds: Normal heart sounds. No murmur heard. Pulmonary:     Effort: Pulmonary effort is normal. No respiratory distress.     Breath sounds: No wheezing, rhonchi or rales.     Comments: RLL crackles Abdominal:     General: Bowel sounds are normal. There is no distension.     Palpations: Abdomen is soft. There is no mass.     Tenderness: There is no abdominal tenderness. There is no guarding or rebound.     Hernia: No hernia is present.  Musculoskeletal:        General: Normal range of motion.     Cervical back: Normal range of motion and neck supple.     Right lower leg: No edema.     Left lower leg: No edema.  Lymphadenopathy:     Cervical: No cervical adenopathy.  Skin:    General: Skin is warm and dry.     Findings: No  rash.  Neurological:     General: No focal deficit present.     Mental Status: He is alert and oriented to person, place, and time.  Psychiatric:        Mood and Affect: Mood normal.        Behavior: Behavior normal.        Thought Content: Thought content normal.        Judgment: Judgment normal.       Results for orders placed or performed in visit on 08/18/22  Lipid panel  Result Value Ref Range   Cholesterol 105 0 - 200 mg/dL   Triglycerides 163.0 (H) 0.0 - 149.0 mg/dL   HDL 38.60 (L) >39.00 mg/dL   VLDL 32.6 0.0 - 40.0 mg/dL   LDL Cholesterol 34 0 - 99 mg/dL   Total CHOL/HDL Ratio 3    NonHDL 09.60   Basic Metabolic Panel  Result Value Ref Range   Sodium 139 135 - 145 mEq/L   Potassium 4.5 3.5 - 5.1 mEq/L   Chloride 103 96 - 112 mEq/L   CO2 26 19 - 32 mEq/L   Glucose, Bld 116 (H) 70 - 99 mg/dL   BUN 19 6 - 23 mg/dL   Creatinine, Ser 1.14 0.40 - 1.50 mg/dL   GFR 61.90 >60.00 mL/min   Calcium 9.4 8.4 - 10.5 mg/dL   Lab Results  Component Value Date   HGBA1C 6.3 (H) 08/14/2022    Lab Results  Component Value Date   VITAMINB12 511 07/21/2021    Assessment & Plan:   Problem List Items Addressed This Visit     Advanced care planning/counseling discussion (Chronic)  Advanced directives: does not want prolonged life support. [place] for reversible cause. Wife would be HCPOA. Advanced directive packet provided today.       Health maintenance examination - Primary (Chronic)    Preventative protocols reviewed and updated unless pt declined. Discussed healthy diet and lifestyle.       Dyslipidemia associated with type 2 diabetes mellitus (HCC)    Chronic, overall stable on crestor and fish oil. The ASCVD Risk score (Arnett DK, et al., 2019) failed to calculate for the following reasons:   The valid total cholesterol range is 130 to 320 mg/dL       Relevant Medications   metFORMIN (GLUCOPHAGE) 1000 MG tablet   rosuvastatin (CRESTOR) 5 MG tablet   Semaglutide,0.25  or 0.5MG/DOS, (OZEMPIC, 0.25 OR 0.5 MG/DOSE,) 2 MG/1.5ML SOPN   Essential hypertension    Chronic, stable , continue current regimen.       Relevant Medications   apixaban (ELIQUIS) 5 MG TABS tablet   diltiazem (CARDIZEM CD) 240 MG 24 hr capsule   metoprolol tartrate (LOPRESSOR) 25 MG tablet   rosuvastatin (CRESTOR) 5 MG tablet   Type 2 diabetes mellitus with other specified complication ([place])    Continue current reigmen.       Relevant Medications   metFORMIN (GLUCOPHAGE) 1000 MG tablet   rosuvastatin (CRESTOR) 5 MG tablet   Semaglutide,0.25 or 0.5MG/DOS, (OZEMPIC, 0.25 OR 0.5 MG/DOSE,) 2 MG/1.5ML SOPN   Other Relevant Orders   Microalbumin / creatinine urine ratio   GERD (gastroesophageal reflux disease)    Stable period on PRN PPI      Relevant Medications   omeprazole (PRILOSEC) 40 MG capsule   Vitamin B12 deficiency    Levels at goal on daily replacement.       Obesity, Class I, BMI 30-34.9    Encouraged healthy diet and lifestyle.       Type 2 diabetes mellitus with diabetic neuropathy, unspecified (HCC)    Chronic, stable on latest A1c. Now on ozempic in addition to metformin.       Relevant Medications   metFORMIN (GLUCOPHAGE) 1000 MG tablet   rosuvastatin (CRESTOR) 5 MG tablet   Semaglutide,0.25 or 0.5MG/DOS, (OZEMPIC, 0.25 OR 0.5 MG/DOSE,) 2 MG/1.5ML SOPN   Diabetic cataract of both eyes ([place])    Planned eye doctor appt next week.       Relevant Medications   metFORMIN (GLUCOPHAGE) 1000 MG tablet   rosuvastatin (CRESTOR) 5 MG tablet   Semaglutide,0.25 or 0.5MG/DOS, (OZEMPIC, 0.25 OR 0.5 MG/DOSE,) 2 MG/1.5ML SOPN   Atrial fibrillation with RVR (HCC)    Chronic, stable period on metoprolol, dilt, eliquis.       Relevant Medications   apixaban (ELIQUIS) 5 MG TABS tablet   diltiazem (CARDIZEM CD) 240 MG 24 hr capsule   metoprolol tartrate (LOPRESSOR) 25 MG tablet   rosuvastatin (CRESTOR) 5 MG tablet   (HFpEF) heart failure with preserved ejection  fraction (HCC)    Chronic, stable on current regimen of dilt, metoprolol, appreciate cards care. Has not needed lasix recently.       Relevant Medications   apixaban (ELIQUIS) 5 MG TABS tablet   diltiazem (CARDIZEM CD) 240 MG 24 hr capsule   metoprolol tartrate (LOPRESSOR) 25 MG tablet   rosuvastatin (CRESTOR) 5 MG tablet     Meds ordered this encounter  Medications   omeprazole (PRILOSEC) 40 MG capsule    Sig: Take 1 capsule (40 mg total) by mouth daily as needed (reflux).    Dispense:  30 capsule    Refill:  6   apixaban (ELIQUIS) 5 MG TABS tablet    Sig: Take 1 tablet (5 mg total) by mouth 2 (two) times daily.    Dispense:  180 tablet    Refill:  3   diltiazem (CARDIZEM CD) 240 MG 24 hr capsule    Sig: Take 1 capsule (240 mg total) by mouth daily.    Dispense:  90 capsule    Refill:  3   metFORMIN (GLUCOPHAGE) 1000 MG tablet    Sig: Take 1 tablet (1,000 mg total) by mouth 2 (two) times daily with a meal.    Dispense:  180 tablet    Refill:  3   metoprolol tartrate (LOPRESSOR) 25 MG tablet    Sig: Take 1 tablet (25 mg total) by mouth 2 (two) times daily.    Dispense:  180 tablet    Refill:  3   rosuvastatin (CRESTOR) 5 MG tablet    Sig: Take 1 tablet (5 mg total) by mouth daily.    Dispense:  90 tablet    Refill:  3   Semaglutide,0.25 or 0.5MG/DOS, (OZEMPIC, 0.25 OR 0.5 MG/DOSE,) 2 MG/1.5ML SOPN    Sig: Inject 0.5 mg into the skin once a week.    Dispense:  4.5 mL    Refill:  3   Orders Placed This Encounter  Procedures   Microalbumin / creatinine urine ratio    Patient instructions: Urine test today.  Send [place] date of first shingles shot to update your chart.  Return in 6 months for diabetes follow up visit   Follow up plan: Return in about 1 year (around 10/19/2023) for annual exam, prior fasting for blood work, medicare wellness visit.  Ria Bush, MD

## 2022-10-18 NOTE — Assessment & Plan Note (Addendum)
Continue current reigmen.

## 2022-10-18 NOTE — Assessment & Plan Note (Signed)
Chronic, stable period on metoprolol, dilt, eliquis.

## 2022-10-20 ENCOUNTER — Telehealth: Payer: Self-pay

## 2022-10-20 ENCOUNTER — Ambulatory Visit: Payer: Self-pay

## 2022-10-20 NOTE — Chronic Care Management (AMB) (Signed)
Chronic Care Management Pharmacy Assistant   Name: Devin Huffman  MRN: [id number] DOB: July 26, 1946   Reason for Encounter: Medication Adherence and Delivery Coordination     Recent office visits:  10/18/22-Javier Gutierrez,MD(PCP)-[facility]- New Afib and CHF diagnosis s/p hospitalization 08/2022, now on eliquis and diltiazem. Discussed screenings, vaccines,UA ,no medication changes, f/u 6 months for DM  Recent consult visits:  08/24/22-Callie Goodrich,PA(cardio)-f/u Afib,Only taking Lasix 32m daily as needed and weights are stable on this. OK to continue PRN dosing for weight gain and worsening lower extremity edema. -Initially was hoping to add Spironolactone or a SGTL2 inhibitor but patient does not want to be on any more medications at this time.-Discussed importance of daily weights and sodium/fluid restrictions.stop asa, now on eliquis,EKG,no medication changes f/u 6 months  Hospital visits:  Medication Reconciliation was completed by comparing discharge summary, patient's EMR and Pharmacy list, and upon discussion with patient.  Admitted to the hospital on 08/13/22 due to Afib. Discharge date was 08/16/22. [facility] -[facility] Cartia XT  Eliquis  Furosemide  Metoprolol tartrate  metformin  Medications Discontinued at Hospital Discharge: -Stopped amlodipine  Metformin XR  Metoprolol succinate  Medications that remain the same after Hospital Discharge:??  -All other medications will remain the same.    Medications: Outpatient Encounter Medications as of 10/20/2022  Medication Sig   apixaban (ELIQUIS) 5 MG TABS tablet Take 1 tablet (5 mg total) by mouth 2 (two) times daily.   Ascorbic Acid (VITAMIN C) 1000 MG tablet Take 1,000 mg by mouth every evening.    blood glucose meter kit and supplies KIT Dispense based on patient and insurance preference. Use to check sugars up to twice daily as  directed (E11.69).   Calcium Carbonate Antacid (TUMS CHEWY BITES PO) Take 2 tablets by mouth at bedtime as needed (reflux).   Cholecalciferol (VITAMIN D3) 25 MCG (1000 UT) CAPS Take 1,000 Units by mouth every evening.   Coenzyme Q10 (CO Q 10 PO) Take 1 capsule by mouth at bedtime.   Cyanocobalamin (VITAMIN B-12 PO) Take 1 capsule by mouth at bedtime.   diltiazem (CARDIZEM CD) 240 MG 24 hr capsule Take 1 capsule (240 mg total) by mouth daily.   glucose blood (GLUCOSE METER TEST) test strip Use as instructed to check sugars twice daily as needed E11.69   metFORMIN (GLUCOPHAGE) 1000 MG tablet Take 1 tablet (1,000 mg total) by mouth 2 (two) times daily with a meal.   metoprolol tartrate (LOPRESSOR) 25 MG tablet Take 1 tablet (25 mg total) by mouth 2 (two) times daily.   Multiple Vitamins-Minerals (EYE VITAMINS PO) Take 2 tablets by mouth at bedtime.   Omega-3 Fatty Acids (FISH OIL PO) Take 1 capsule by mouth daily.   omeprazole (PRILOSEC) 40 MG capsule Take 1 capsule (40 mg total) by mouth daily as needed (reflux).   rosuvastatin (CRESTOR) 5 MG tablet Take 1 tablet (5 mg total) by mouth daily.   Semaglutide,0.25 or 0.5MG/DOS, (OZEMPIC, 0.25 OR 0.5 MG/DOSE,) 2 MG/1.5ML SOPN Inject 0.5 mg into the skin once a week.   No facility-administered encounter medications on file as of 10/20/2022.    BP Readings from Last 3 Encounters:  10/18/22 136/70  08/24/22 136/68  08/18/22 122/86    Lab Results  Component Value Date   HGBA1C 6.3 (H) 08/14/2022      Recent OV, Consult or Hospital visit:  Recent medication changes indicated:  08/13/22 f/u  hospital visit  added eliquis and diltizem   Last adherence delivery date:08/03/22      Patient is due for next adherence delivery on: 11/01/22  Spoke with patient on 10/20/22 reviewed medications and coordinated delivery.  This delivery to include: Vials  90 Days  VIAL medications: Amlodipine 10 mg - 1 tablet at bedtime Rosuvastatin 5 mg - 1 tablet  daily Metoprolol 25 mg- 1 tablet daily at bedtime  Metformin 1053m - 1 tablet twice daily  Eliquis 5 mg  take 1 tablet twice daily  Diltiazem 2429mtake 1 tablet daily  Omeprazole 4070m take 1 tablet as needed  Patient declined the following medications this month: Vitamin supplements, D3,C,B12,CoQ10,Fishoil, will get OTC  Meds from manufacturer:  Ozempic   Any concerns about your medications? No  How often do you forget or accidentally miss a dose? Never  Do you use a pillbox? Yes  Is patient in packaging No   Refills requested from providers include: Eliquis  Confirmed delivery date of 11/01/22, advised patient that pharmacy will contact them the morning of delivery.  Recent blood pressure readings are as follows:none available  Recent blood glucose readings are as follows: none available   Annual wellness visit in last year? Yes Most Recent BP reading:136/70  10/18/22  If Diabetic: Most recent A1C reading:6.3 Last eye exam / retinopathy screening:UTD Last diabetic foot exam:UTD  Cycle dispensing form [facility] for review.  LinCharlene BrookePP notified  VelAvel SensorCMMount [phone]

## 2022-10-20 NOTE — Patient Outreach (Signed)
  Care Coordination   Follow Up Visit Note   10/20/2022 Name: Devin Huffman MRN: [id number] DOB: 05-22-1946  Devin Huffman is a 76 y.o. year old male who sees Ria Bush, MD for primary care. I spoke with  [name] by phone today.  What matters to the patients health and wellness today?  Patient wished a happy birthday.  Patient reports having his annual exam with primary provider on 10/18/22.  He states he is doing well.   He reports pulse range from 60-70's and fasting blood sugars from 130-160's. Patient reports he is down 2 lbs in his weight of 218 lbs.  Patient denies shortness or chest pain.    Goals Addressed             This Visit's Progress    Patient states: Managing my health conditions       Care Coordination Interventions: Assessed for heart failure / atrial fibrillation symptoms. Advised to monitor weight and pulse daily and record.  Reviewed medications and discussed importance of compliance Advised to report any increase in symptoms to provider as soon as possible. Call 911 for severe symptoms. Reviewed / discussed home fasting blood sugar levels. Advised to continue to monitor blood sugars daily and record.  Patient advised to take lasix as recommended by his primary care provider.  Marland Kitchen           SDOH assessments and interventions completed:  No     Care Coordination Interventions Activated:  Yes  Care Coordination Interventions:  Yes, provided   Follow up plan: Follow up call scheduled for 12/26/21 at 10 am    Encounter Outcome:  Pt. Visit Completed   Quinn Plowman RN,BSN,CCM [place] [phone] direct line

## 2022-10-31 DIAGNOSIS — H35033 Hypertensive retinopathy, bilateral: Secondary | ICD-10-CM | POA: Diagnosis not present

## 2022-10-31 DIAGNOSIS — H43813 Vitreous degeneration, bilateral: Secondary | ICD-10-CM | POA: Diagnosis not present

## 2022-10-31 DIAGNOSIS — H25013 Cortical age-related cataract, bilateral: Secondary | ICD-10-CM | POA: Diagnosis not present

## 2022-10-31 DIAGNOSIS — H2513 Age-related nuclear cataract, bilateral: Secondary | ICD-10-CM | POA: Diagnosis not present

## 2022-10-31 DIAGNOSIS — H524 Presbyopia: Secondary | ICD-10-CM | POA: Diagnosis not present

## 2022-10-31 LAB — HM DIABETES EYE EXAM

## 2022-11-07 ENCOUNTER — Encounter: Payer: Self-pay | Admitting: Ophthalmology

## 2022-11-12 NOTE — Progress Notes (Signed)
Cardiology Office Note:    Date:  11/13/2022   ID:  Devin Huffman, DOB 03/07/1946, MRN [id number]  PCP:  Ria Bush, MD  Cardiologist:  Minus Breeding, MD  Electrophysiologist:  None   Referring MD: Ria Bush, MD   Chief Complaint: hospital follow-up of CHF and atrial fibrillation  History of Present Illness:    Devin Huffman is a 76 y.o. male with a history of chronic diastolic CHF, paroxysmal atrial fibrillation on Eliquis, hypertension, hyperlipidemia, type 2 diabetes mellitus, and GERD. He was admitted from 08/13/2022 to 08/16/2022 with newly diagnosed diastolic CHF and newly diagnosed atrial fibrillation with RVR after presenting with shortness of breath and chest discomfort.  BNP was elevated at 439.  High-sensitivity troponin was negative.  Echo showed LVEF of 50-55% with mild LVH, mildly enlarged RV with mildly reduced systolic function and mildly elevated PASP.  He was diuresed with IV Lasix with good response and then transition to PO Lasix 20 mg daily.  He was also started on Eliquis, Lopressor, and Diltiazem for his atrial fibrillation and thankfully converted back to normal sinus rhythm.  Chest pain resolved with above treatment and was felt to be due to volume overload and atrial fibrillation with RVR.  No ischemic work-up was felt to be necessary.  He presents today for follow-up.  He had no new problems since I saw him.  He denies any cardiovascular symptoms such as chest pressure, neck or arm discomfort.  He has some sleep problems so he does not really exercise is much as I would like if he does some chores around the house.  He has not noticed any symptoms similar to what brought him to the hospital previously.    Past Medical History:  Diagnosis Date   Acute pulmonary edema ([place]) 08/13/2022   Cancer ([place])    skin cancer removed from nose   COVID-19 virus infection 11/23/2021   Diabetes mellitus without complication ([facility])    GERD (gastroesophageal reflux  disease)    occasional   Gout    ?due to L great toe pain, saw Dr Gladstone Lighter   History of chicken pox    Hyperlipidemia    Hypertension    Legg-Perthes disease    Right hip   Nasal septal deviation    monitoring - [place]   Pneumonia due to COVID-19 virus 01/21/2020   Sepsis ([place])    In March 2018    Past Surgical History:  Procedure Laterality Date   COLONOSCOPY  03/2011   2 tubular adenomas, rec rpt 5 yrs   COLONOSCOPY  01/2018   diverticulosis, rpt 5 yrs [facility])   HERNIA REPAIR     double hernia   LUMBAR FUSION  04/2013   transforaminal interbody fusion L4/5 ([place]) for R L5 radiculopathy, L4/5 spondylolisthesis, and L4/5 HNP with compression of spinal cord   TONSILLECTOMY     TOTAL HIP ARTHROPLASTY Right 03/07/2017   Procedure: RIGHT TOTAL HIP ARTHROPLASTY ANTERIOR APPROACH;  Surgeon: Gaynelle Arabian, MD;  Location: [facility];  Service: Orthopedics;  Laterality: Right;   VASECTOMY     WISDOM TOOTH EXTRACTION      Current Medications: Current Meds  Medication Sig   apixaban (ELIQUIS) 5 MG TABS tablet Take 1 tablet (5 mg total) by mouth 2 (two) times daily.   Ascorbic Acid (VITAMIN C) 1000 MG tablet Take 1,000 mg by mouth every evening.    blood glucose meter kit and supplies KIT Dispense based on patient and insurance preference. Use  to check sugars up to twice daily as directed (E11.69).   Calcium Carbonate Antacid (TUMS CHEWY BITES PO) Take 2 tablets by mouth at bedtime as needed (reflux).   Cholecalciferol (VITAMIN D3) 25 MCG (1000 UT) CAPS Take 1,000 Units by mouth every evening.   Coenzyme Q10 (CO Q 10 PO) Take 1 capsule by mouth at bedtime.   Cyanocobalamin (VITAMIN B-12 PO) Take 1 capsule by mouth at bedtime.   diltiazem (CARDIZEM CD) 240 MG 24 hr capsule Take 1 capsule (240 mg total) by mouth daily.   glucose blood (GLUCOSE METER TEST) test strip Use as instructed to check sugars twice daily as needed E11.69   metFORMIN (GLUCOPHAGE) 1000 MG tablet Take 1 tablet (1,000  mg total) by mouth 2 (two) times daily with a meal.   metoprolol tartrate (LOPRESSOR) 25 MG tablet Take 1 tablet (25 mg total) by mouth 2 (two) times daily.   Multiple Vitamins-Minerals (EYE VITAMINS PO) Take 2 tablets by mouth at bedtime.   Omega-3 Fatty Acids (FISH OIL PO) Take 1 capsule by mouth daily.   omeprazole (PRILOSEC) 40 MG capsule Take 1 capsule (40 mg total) by mouth daily as needed (reflux).   rosuvastatin (CRESTOR) 5 MG tablet Take 1 tablet (5 mg total) by mouth daily.   Semaglutide,0.25 or 0.5MG/DOS, (OZEMPIC, 0.25 OR 0.5 MG/DOSE,) 2 MG/1.5ML SOPN Inject 0.5 mg into the skin once a week.     Allergies:   Lisinopril    ROS:   Please see the history of present illness.     EKGs/Labs/Other Studies Reviewed:    The following studies were reviewed:  Echocardiogram 08/14/2022: Impressions: 1. Left ventricular ejection fraction, by estimation, is 50 to 55%. The  left ventricle has low normal function. Left ventricular endocardial  border not optimally defined to evaluate regional wall motion. There is  mild left ventricular hypertrophy. Left  ventricular diastolic parameters are indeterminate.   2. Right ventricular systolic function is mildly reduced. The right  ventricular size is mildly enlarged. There is mildly elevated pulmonary  artery systolic pressure. The estimated right ventricular systolic  pressure is 21.3 mmHg.   3. The mitral valve is grossly normal. Trivial mitral valve  regurgitation. No evidence of mitral stenosis.   4. The aortic valve is grossly normal. There is mild calcification of the  aortic valve. Aortic valve regurgitation is not visualized. No aortic  stenosis is present.   5. The inferior vena cava is dilated in size with >50% respiratory  variability, suggesting right atrial pressure of 8 mmHg.   EKG:  EKG ordered today. EKG personally reviewed and demonstrates normal sinus rhythm, rate 70 bpm, with 1st degree AV block and no acute ST/T  changes. Left axis deviation. QTc 432 ms.  Recent Labs: 08/13/2022: B Natriuretic Peptide 439.4 08/14/2022: ALT 16; Hemoglobin 13.6; Platelets 223; TSH 3.429 08/15/2022: Magnesium 1.8 08/18/2022: BUN 19; Creatinine, Ser 1.14; Potassium 4.5; Sodium 139  Recent Lipid Panel    Component Value Date/Time   CHOL 105 08/18/2022 0808   TRIG 163.0 (H) 08/18/2022 0808   HDL 38.60 (L) 08/18/2022 0808   CHOLHDL 3 08/18/2022 0808   VLDL 32.6 08/18/2022 0808   LDLCALC 34 08/18/2022 0808   LDLDIRECT 45.0 07/21/2021 0743    Physical Exam:    Vital Signs: BP (!) 168/76   Pulse 65   Ht _0  (1.803 m)   Wt 223 lb (101.2 kg)   SpO2 95%   BMI 31.10 kg/m     Wt  Readings from Last 3 Encounters:  11/13/22 223 lb (101.2 kg)  10/18/22 220 lb 9.6 oz (100.1 kg)  08/24/22 227 lb 3.2 oz (103.1 kg)    GENERAL:  Well appearing NECK:  No jugular venous distention, waveform within normal limits, carotid upstroke brisk and symmetric, no bruits, no thyromegaly LUNGS:  Clear to auscultation bilaterally CHEST:  Unremarkable HEART:  PMI not displaced or sustained,S1 and S2 within normal limits, no S3, no S4, no clicks, no rubs, no murmurs ABD:  Flat, positive bowel sounds normal in frequency in pitch, no bruits, no rebound, no guarding, no midline pulsatile mass, no hepatomegaly, no splenomegaly EXT:  2 plus pulses throughout, no edema, no cyanosis no clubbing   Assessment/Plan:    Chronic Diastolic CHF: He seems to be euvolemic.  No change in therapy.  He is up-to-date with blood work.  He has not wanted to take more medications so I cannot uptitrate with the addition of other medicines.  Paroxysmal Atrial Fibrillation: He seems to be maintaining sinus rhythm.  We talked about getting a [place].  He will continue the meds as listed.  He is having no complications from the Eliquis.  Hypertension: His blood pressure is not at target.  I asked him to keep a blood pressure diary and I be happy to review  this.  Hyperlipidemia: LDL was 34 with an HDL of 38.  No change in therapy.  Type 2 Diabetes Mellitus: A1c is 6.3.  Continue current meds.  Disposition: Follow up in 12 months.  Medication Adjustments/Labs and Tests Ordered: Current medicines are reviewed at length with the patient today.  Concerns regarding medicines are outlined above.  No orders of the defined types were placed in this encounter.  No orders of the defined types were placed in this encounter.   Patient Instructions  Medication Instructions:  Your physician recommends that you continue on your current medications as directed. Please refer to the Current Medication list given to you today.  *If you need a refill on your cardiac medications before your next appointment, please call your pharmacy*   Lab Work: None ordered If you have labs (blood work) drawn today and your tests are completely normal, you will receive your results only by: [place] (if you have MyChart) OR A paper copy in the mail If you have any lab test that is abnormal or we need to change your treatment, we will call you to review the results.   Testing/Procedures: None ordered   Follow-Up: At [facility], you and your health needs are our priority.  As part of our continuing mission to provide you with exceptional heart care, we have created designated Provider Care Teams.  These Care Teams include your primary Cardiologist (physician) and Advanced Practice Providers (APPs -  Physician Assistants and Nurse Practitioners) who all work together to provide you with the care you need, when you need it.  We recommend signing up for the patient portal called "MyChart".  Sign up information is provided on this After Visit Summary.  MyChart is used to connect with patients for Virtual Visits (Telemedicine).  Patients are able to view lab/test results, encounter notes, upcoming appointments, etc.  Non-urgent messages can be sent to your  provider as well.   To learn more about what you can do with MyChart, go to [email or web address].    Your next appointment:   12 month(s)  The format for your next appointment:   In Person  Provider:   Jeneen Rinks  Addalie Calles, MD    Other Instructions none  Important Information About Sugar          Signed, Minus Breeding, MD  11/13/2022 8:59 AM    [facility]

## 2022-11-13 ENCOUNTER — Ambulatory Visit: Payer: Medicare HMO | Attending: Cardiology | Admitting: Cardiology

## 2022-11-13 ENCOUNTER — Encounter: Payer: Self-pay | Admitting: Cardiology

## 2022-11-13 VITALS — BP 168/76 | HR 65 | Ht 71.0 in | Wt 223.0 lb

## 2022-11-13 DIAGNOSIS — I5032 Chronic diastolic (congestive) heart failure: Secondary | ICD-10-CM

## 2022-11-13 DIAGNOSIS — I10 Essential (primary) hypertension: Secondary | ICD-10-CM

## 2022-11-13 DIAGNOSIS — I480 Paroxysmal atrial fibrillation: Secondary | ICD-10-CM | POA: Diagnosis not present

## 2022-11-13 DIAGNOSIS — E118 Type 2 diabetes mellitus with unspecified complications: Secondary | ICD-10-CM

## 2022-11-13 DIAGNOSIS — E785 Hyperlipidemia, unspecified: Secondary | ICD-10-CM | POA: Diagnosis not present

## 2022-11-13 NOTE — Patient Instructions (Signed)
Medication Instructions:  Your physician recommends that you continue on your current medications as directed. Please refer to the Current Medication list given to you today.  *If you need a refill on your cardiac medications before your next appointment, please call your pharmacy*   Lab Work: None ordered If you have labs (blood work) drawn today and your tests are completely normal, you will receive your results only by: [place] (if you have MyChart) OR A paper copy in the mail If you have any lab test that is abnormal or we need to change your treatment, we will call you to review the results.   Testing/Procedures: None ordered   Follow-Up: At [facility], you and your health needs are our priority.  As part of our continuing mission to provide you with exceptional heart care, we have created designated Provider Care Teams.  These Care Teams include your primary Cardiologist (physician) and Advanced Practice Providers (APPs -  Physician Assistants and Nurse Practitioners) who all work together to provide you with the care you need, when you need it.  We recommend signing up for the patient portal called "MyChart".  Sign up information is provided on this After Visit Summary.  MyChart is used to connect with patients for Virtual Visits (Telemedicine).  Patients are able to view lab/test results, encounter notes, upcoming appointments, etc.  Non-urgent messages can be sent to your provider as well.   To learn more about what you can do with MyChart, go to [email or web address].    Your next appointment:   12 month(s)  The format for your next appointment:   In Person  Provider:   Minus Breeding, MD    Other Instructions none  Important Information About Sugar

## 2022-12-20 ENCOUNTER — Telehealth: Payer: Self-pay

## 2022-12-20 NOTE — Progress Notes (Signed)
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   Ascorbic Acid (VITAMIN C) 1000 MG tablet Take 1,000 mg by mouth every evening.    blood glucose meter kit and supplies KIT Dispense based on patient and insurance preference. Use to check sugars up to twice daily as directed (E11.69).   Calcium Carbonate Antacid (TUMS CHEWY BITES PO) Take 2 tablets by mouth at bedtime as needed (reflux).   Cholecalciferol (VITAMIN D3) 25 MCG (1000 UT) CAPS Take 1,000 Units by mouth every evening.   Coenzyme Q10 (CO Q 10 PO) Take 1 capsule by mouth at bedtime.   Cyanocobalamin (VITAMIN B-12 PO) Take 1 capsule by mouth at bedtime.   diltiazem (CARDIZEM CD) 240 MG 24 hr capsule Take 1 capsule (240 mg total) by mouth daily.   glucose blood (GLUCOSE METER TEST) test strip Use as instructed to check sugars twice daily as needed E11.69   metFORMIN (GLUCOPHAGE) 1000 MG tablet Take 1 tablet (1,000 mg total) by mouth 2 (two) times daily with a meal.   metoprolol tartrate (LOPRESSOR) 25 MG tablet Take 1 tablet (25 mg total) by mouth 2 (two) times daily.   Multiple Vitamins-Minerals (EYE VITAMINS PO) Take 2 tablets by mouth at bedtime.   Omega-3 Fatty Acids (FISH OIL PO) Take 1 capsule by mouth daily.   omeprazole (PRILOSEC) 40 MG capsule Take 1 capsule (40 mg total) by mouth daily as needed (reflux).   rosuvastatin (CRESTOR) 5 MG tablet Take 1 tablet  (5 mg total) by mouth daily.   Semaglutide,0.25 or 0.'5MG'$ /DOS, (OZEMPIC, 0.25 OR 0.5 MG/DOSE,) 2 MG/1.5ML SOPN Inject 0.5 mg into the skin once a week.   No facility-administered encounter medications on file as of 12/20/2022.   BP Readings from Last 3 Encounters:  11/13/22 (!) 168/76  10/18/22 136/70  08/24/22 136/68    Pulse Readings from Last 3 Encounters:  11/13/22 65  10/18/22 63  08/24/22 70    Lab Results  Component Value Date/Time   HGBA1C 6.3 (H) 08/14/2022 01:43 AM   HGBA1C 7.2 (A) 02/15/2022 10:34 AM   HGBA1C 6.9 (H) 07/21/2021 07:43 AM   Lab Results  Component Value Date   CREATININE 1.14 08/18/2022   BUN 19 08/18/2022   GFR 61.90 08/18/2022   GFRNONAA 59 (L) 08/16/2022   GFRAA >60 01/27/2020   NA 139 08/18/2022   K 4.5 08/18/2022   CALCIUM 9.4 08/18/2022   CO2 26 08/18/2022    Last adherence delivery date: 11/01/2022      Patient is due for next adherence delivery on: 01/01/2023  Spoke with patient on 12/20/2022 reviewed medications and coordinated delivery.  This delivery to include: Vials  30 Days  VIAL medications: Eliquis 5 mg  take 1 tablet twice daily  Omeprazole '40mg'$  - take 1 tablet as needed   Patient declined the following medications this month:  Purchases OTC: Vitamin supplements, D3,C,B12,CoQ10,Fish oil   Receives from manufacturer:   Ozempic  No refill request  needed.  Confirmed delivery date of 01/01/2023, advised patient that pharmacy will contact them the morning of delivery.   Any concerns about your medications? No  How often do you forget or accidentally miss a dose? Never  Do you use a pillbox? Yes  Is patient in packaging No   Recent blood pressure readings are as follows: No readings at this time  Recent blood glucose readings are as follows: No readings at this time  Cycle dispensing form sent to [facility] for review.  Marijean Niemann, CMA

## 2022-12-21 ENCOUNTER — Telehealth: Payer: Self-pay | Admitting: Pharmacist

## 2022-12-21 NOTE — Telephone Encounter (Signed)
Received message from patient's wife Hassan Rowan. She reports she received a letter from [employer] about his Ozempic assistance that needs to be renewed for 2024.  I have submitted the online application for the renewal today. It may take several weeks to a month to get approval and receive shipment.

## 2022-12-21 NOTE — Telephone Encounter (Cosign Needed)
Spoke with patient and informed him.  Charlene Brooke, PharmD notified  Marijean Niemann, [place] Clinical Pharmacy Assistant [phone]

## 2022-12-26 ENCOUNTER — Ambulatory Visit: Payer: Self-pay

## 2022-12-26 NOTE — Patient Outreach (Signed)
  Care Coordination   12/26/2022 Name: Devin Huffman MRN: [id number] DOB: 10-30-1946   Care Coordination Outreach Attempts:  Successful contact made with patient.  Patient states he is current out eating breakfast.  Agreed to call back later today.   Follow Up Plan:  Additional outreach attempts will be made to offer the patient care coordination information and services.   Encounter Outcome:  Pt. Request to Call Back   Care Coordination Interventions:  No, not indicated  No, not indicated  Quinn Plowman [facility] [phone] direct line

## 2023-01-09 NOTE — Telephone Encounter (Signed)
Patients wife called in requesting a phone call regarding [place] program that her husband is apart of

## 2023-01-09 NOTE — Telephone Encounter (Signed)
Spoke with patient's wife. Discussed [employer] application for [id number] was approved and medication shipment is in process. Office staff will contact patient when medication is received. She voiced understanding and appreciation.

## 2023-01-11 ENCOUNTER — Encounter: Payer: Self-pay | Admitting: Family Medicine

## 2023-01-11 ENCOUNTER — Ambulatory Visit (INDEPENDENT_AMBULATORY_CARE_PROVIDER_SITE_OTHER): Payer: Medicare HMO | Admitting: Family Medicine

## 2023-01-11 VITALS — BP 122/68 | HR 75 | Temp 98.0°F | Resp 16 | Ht 71.0 in | Wt 240.0 lb

## 2023-01-11 DIAGNOSIS — S39012A Strain of muscle, fascia and tendon of lower back, initial encounter: Secondary | ICD-10-CM | POA: Diagnosis not present

## 2023-01-11 DIAGNOSIS — R1032 Left lower quadrant pain: Secondary | ICD-10-CM | POA: Diagnosis not present

## 2023-01-11 DIAGNOSIS — R051 Acute cough: Secondary | ICD-10-CM | POA: Diagnosis not present

## 2023-01-11 DIAGNOSIS — Z85828 Personal history of other malignant neoplasm of skin: Secondary | ICD-10-CM | POA: Insufficient documentation

## 2023-01-11 MED ORDER — AMOXICILLIN 500 MG PO CAPS: 1000.0000 mg | ORAL_CAPSULE | Freq: Two times a day (BID) | ORAL | 0 refills | Status: DC

## 2023-01-11 NOTE — Assessment & Plan Note (Signed)
Acute, no red flags.  Treat with heat and Tylenol as well as gentle stretching.

## 2023-01-11 NOTE — Patient Instructions (Addendum)
Start with Flonase 2 sprays per nostril daily and Mucinex plain (no decongestant) twice daily to break up mucus. Rest and push fluids. If not improving as expected you can fill prescription for antibiotics sent to the pharmacy. Start low back stretches and heat on low back pain.

## 2023-01-11 NOTE — Assessment & Plan Note (Signed)
Acute, very mild pain likely due to back brace pushing on lower abdomen and bruising in the area versus gas pain or constipation..  Less likely something like acute diverticulitis. Recommended pushing water and symptomatic care.  Return and ER precautions provided

## 2023-01-11 NOTE — Progress Notes (Signed)
Patient ID: Devin Huffman, male    DOB: 1946/05/02, 77 y.o.   MRN: [id number]  This visit was conducted in person.  BP 122/68   Pulse 75   Temp 98 F (36.7 C)   Resp 16   Ht '5\' 11"'$  (1.803 m)   Wt 240 lb (108.9 kg)   SpO2 94%   BMI 33.47 kg/m    CC:  Chief Complaint  Patient presents with   Cough    X 2 weeks    Subjective:   HPI: Devin Huffman is a 77 y.o. male patient of Dr. Darnell Level with history of heart failure with preserved ejection fraction, atrial fibrillation, hypertension, diabetes and obesity presenting on 01/11/2023 for Cough (X 2 weeks)  Date of onset: 1.5 weeks Initial symptoms included  coughing  INcrease hoarseness.  No ear pain, no face pain.  No fever. Has been sweating.  No body aches.  No ST, some PND.  No SOB, no wheeze.   Has history of AM cough from allergies longterm.. this is worse.   Sick contacts: none COVID testing:    negative out of date test.     He has  not taking anything.     No history of chronic lung disease such as asthma or COPD. Non-smoker.     Also noting low back pain after leaning over working, no radiation of pain x 4-5 days   Has also felt pressure in LLQ 3-4/10 pain only when pressing. Started after ewaering back brace that was to tight pressing on abdomen.  No N/V/D/C. Nml BM today, no blood in stool. Ongoing 3 days.  Relevant past medical, surgical, family and social history reviewed and updated as indicated. Interim medical history since our last visit reviewed. Allergies and medications reviewed and updated. Outpatient Medications Prior to Visit  Medication Sig Dispense Refill   apixaban (ELIQUIS) 5 MG TABS tablet Take 1 tablet (5 mg total) by mouth 2 (two) times daily. 180 tablet 3   Ascorbic Acid (VITAMIN C) 1000 MG tablet Take 1,000 mg by mouth every evening.      blood glucose meter kit and supplies KIT Dispense based on patient and insurance preference. Use to check sugars up to twice daily as directed  (E11.69). 1 each 0   Calcium Carbonate Antacid (TUMS CHEWY BITES PO) Take 2 tablets by mouth at bedtime as needed (reflux).     Cholecalciferol (VITAMIN D3) 25 MCG (1000 UT) CAPS Take 1,000 Units by mouth every evening.     Coenzyme Q10 (CO Q 10 PO) Take 1 capsule by mouth at bedtime.     Cyanocobalamin (VITAMIN B-12 PO) Take 1 capsule by mouth at bedtime.     diltiazem (CARDIZEM CD) 240 MG 24 hr capsule Take 1 capsule (240 mg total) by mouth daily. 90 capsule 3   glucose blood (GLUCOSE METER TEST) test strip Use as instructed to check sugars twice daily as needed E11.69 100 each 12   metFORMIN (GLUCOPHAGE) 1000 MG tablet Take 1 tablet (1,000 mg total) by mouth 2 (two) times daily with a meal. 180 tablet 3   metoprolol tartrate (LOPRESSOR) 25 MG tablet Take 1 tablet (25 mg total) by mouth 2 (two) times daily. 180 tablet 3   Multiple Vitamins-Minerals (EYE VITAMINS PO) Take 2 tablets by mouth at bedtime.     Omega-3 Fatty Acids (FISH OIL PO) Take 1 capsule by mouth daily.     omeprazole (PRILOSEC) 40 MG capsule Take 1 capsule (  40 mg total) by mouth daily as needed (reflux). 30 capsule 6   rosuvastatin (CRESTOR) 5 MG tablet Take 1 tablet (5 mg total) by mouth daily. 90 tablet 3   Semaglutide,0.25 or 0.'5MG'$ /DOS, (OZEMPIC, 0.25 OR 0.5 MG/DOSE,) 2 MG/1.5ML SOPN Inject 0.5 mg into the skin once a week. 4.5 mL 3   No facility-administered medications prior to visit.     Per HPI unless specifically indicated in ROS section below Review of Systems  Constitutional:  Negative for fatigue and fever.  HENT:  Negative for ear pain.   Eyes:  Negative for pain.  Respiratory:  Negative for cough and shortness of breath.   Cardiovascular:  Negative for chest pain, palpitations and leg swelling.  Gastrointestinal:  Negative for abdominal pain.  Genitourinary:  Negative for dysuria.  Musculoskeletal:  Negative for arthralgias.  Neurological:  Negative for syncope, light-headedness and headaches.   Psychiatric/Behavioral:  Negative for dysphoric mood.    Objective:  BP 122/68   Pulse 75   Temp 98 F (36.7 C)   Resp 16   Ht '5\' 11"'$  (1.803 m)   Wt 240 lb (108.9 kg)   SpO2 94%   BMI 33.47 kg/m   Wt Readings from Last 3 Encounters:  01/11/23 240 lb (108.9 kg)  11/13/22 223 lb (101.2 kg)  10/18/22 220 lb 9.6 oz (100.1 kg)      Physical Exam Constitutional:      General: He is not in acute distress.    Appearance: Normal appearance. He is well-developed. He is not ill-appearing or toxic-appearing.  HENT:     Head: Normocephalic and atraumatic.     Right Ear: Hearing, tympanic membrane, ear canal and external ear normal. No tenderness. No foreign body. Tympanic membrane is not retracted or bulging.     Left Ear: Hearing, tympanic membrane, ear canal and external ear normal. No tenderness. No foreign body. Tympanic membrane is not retracted or bulging.     Nose: Nose normal. No mucosal edema or rhinorrhea.     Right Sinus: No maxillary sinus tenderness or frontal sinus tenderness.     Left Sinus: No maxillary sinus tenderness or frontal sinus tenderness.     Mouth/Throat:     Dentition: Normal dentition. No dental caries.     Pharynx: Uvula midline. No oropharyngeal exudate.     Tonsils: No tonsillar abscesses.  Eyes:     General: Lids are normal. Lids are everted, no foreign bodies appreciated.     Conjunctiva/sclera: Conjunctivae normal.     Pupils: Pupils are equal, round, and reactive to light.  Neck:     Thyroid: No thyroid mass or thyromegaly.     Vascular: No carotid bruit.     Trachea: Trachea and phonation normal.  Cardiovascular:     Rate and Rhythm: Normal rate and regular rhythm.     Pulses: Normal pulses.     Heart sounds: Normal heart sounds, S1 normal and S2 normal. No murmur heard.    No gallop.  Pulmonary:     Effort: Pulmonary effort is normal. No respiratory distress.     Breath sounds: Normal breath sounds. No wheezing, rhonchi or rales.   Abdominal:     General: Bowel sounds are normal.     Palpations: Abdomen is soft.     Tenderness: There is abdominal tenderness in the left lower quadrant. There is no right CVA tenderness, left CVA tenderness, guarding or rebound.     Hernia: No hernia is present.  Musculoskeletal:  Cervical back: Normal range of motion and neck supple.     Lumbar back: Tenderness present. No bony tenderness. Negative right straight leg raise test and negative left straight leg raise test.  Skin:    General: Skin is warm and dry.     Findings: No rash.  Neurological:     Mental Status: He is alert.     Deep Tendon Reflexes: Reflexes are normal and symmetric.  Psychiatric:        Speech: Speech normal.        Behavior: Behavior normal.        Judgment: Judgment normal.       Results for orders placed or performed in visit on 11/07/22  HM DIABETES EYE EXAM  Result Value Ref Range   HM Diabetic Eye Exam No Retinopathy No Retinopathy    Assessment and Plan  Acute cough Assessment & Plan: acute, viral infection likely but given greater than 1-1/2 to 2 weeks of symptoms will provide antibiotics to use if not improving with initial care. Start Flonase 2 sprays per nostril daily and Mucinex twice daily to break up mucus.  Return and ER precautions provided.   Acute left lower quadrant pain Assessment & Plan: Acute, very mild pain likely due to back brace pushing on lower abdomen and bruising in the area versus gas pain or constipation..  Less likely something like acute diverticulitis. Recommended pushing water and symptomatic care.  Return and ER precautions provided   Strain of muscle, fascia and tendon of lower back, initial encounter Assessment & Plan: Acute, no red flags.  Treat with heat and Tylenol as well as gentle stretching.   Other orders -     Amoxicillin; Take 2 capsules (1,000 mg total) by mouth 2 (two) times daily. I fill if not improving after 2 to 3 days.  Void after  January 24, 2023  Dispense: 40 capsule; Refill: 0    No follow-ups on file.   Eliezer Lofts, MD

## 2023-01-11 NOTE — Assessment & Plan Note (Signed)
acute, viral infection likely but given greater than 1-1/2 to 2 weeks of symptoms will provide antibiotics to use if not improving with initial care. Start Flonase 2 sprays per nostril daily and Mucinex twice daily to break up mucus.  Return and ER precautions provided.

## 2023-01-16 ENCOUNTER — Telehealth: Payer: Self-pay

## 2023-01-16 NOTE — Telephone Encounter (Signed)
Received pt's Ozempic 0.25, 0.5 mg dose shipment (5 bxs total) today.   Spoke with pt notifying him of medication delivery.  Verbalizes understanding.    [Placed Ozempic (5 bxs total) in 2nd refrigerator, 3rd shelf.]

## 2023-01-17 NOTE — Telephone Encounter (Signed)
Pt came by and picked the medication up.

## 2023-01-18 ENCOUNTER — Telehealth: Payer: Self-pay

## 2023-01-18 NOTE — Progress Notes (Signed)
Care Management & Coordination Services Pharmacy Team  Reason for Encounter: Medication coordination and delivery  Contacted patient to discuss medications and coordinate delivery from [facility].  Spoke with patient on 01/19/2023   Cycle dispensing form sent to Charlene Brooke, PharmD for review.   Last adherence delivery date: 01/01/2023      Patient is due for next adherence delivery on: 01/31/2023  This delivery to include: Vials  90 Days  VIAL medications: Eliquis 5 mg  take 1 tablet twice daily  Rosuvastatin 5 mg - 1 tablet daily Metoprolol 25 mg- 1 tablet daily at bedtime  Metformin 1027m - 1 tablet twice daily  Diltiazem 2439mtake 1 tablet daily  Omeprazole 4095m take 1 tablet as needed  Patient declined the following medications this month: None declined   Purchases OTC: Vitamin supplements, D3,C,B12,CoQ10,Fish oil   Receives from manufacturer:   Ozempic  No refill request needed.  Confirmed delivery date of 01/31/2023, advised patient that pharmacy will contact them the morning of delivery.   Any concerns about your medications? No  How often do you forget or accidentally miss a dose? Never  Do you use a pillbox? Yes  Is patient in packaging No  Chart review: Recent office visits:  None since last contact  Recent consult visits:  01/11/23 AmyEliezer LoftsD Acute cough Start: Amoxicillin 1,000 mg  Hospital visits:  None since last contact  Medications: Outpatient Encounter Medications as of 01/18/2023  Medication Sig   amoxicillin (AMOXIL) 500 MG capsule Take 2 capsules (1,000 mg total) by mouth 2 (two) times daily. I fill if not improving after 2 to 3 days.  Void after January 24, 2023   apixaban (ELIQUIS) 5 MG TABS tablet Take 1 tablet (5 mg total) by mouth 2 (two) times daily.   Ascorbic Acid (VITAMIN C) 1000 MG tablet Take 1,000 mg by mouth every evening.    blood glucose meter kit and supplies KIT Dispense based on patient and  insurance preference. Use to check sugars up to twice daily as directed (E11.69).   Calcium Carbonate Antacid (TUMS CHEWY BITES PO) Take 2 tablets by mouth at bedtime as needed (reflux).   Cholecalciferol (VITAMIN D3) 25 MCG (1000 UT) CAPS Take 1,000 Units by mouth every evening.   Coenzyme Q10 (CO Q 10 PO) Take 1 capsule by mouth at bedtime.   Cyanocobalamin (VITAMIN B-12 PO) Take 1 capsule by mouth at bedtime.   diltiazem (CARDIZEM CD) 240 MG 24 hr capsule Take 1 capsule (240 mg total) by mouth daily.   glucose blood (GLUCOSE METER TEST) test strip Use as instructed to check sugars twice daily as needed E11.69   metFORMIN (GLUCOPHAGE) 1000 MG tablet Take 1 tablet (1,000 mg total) by mouth 2 (two) times daily with a meal.   metoprolol tartrate (LOPRESSOR) 25 MG tablet Take 1 tablet (25 mg total) by mouth 2 (two) times daily.   Multiple Vitamins-Minerals (EYE VITAMINS PO) Take 2 tablets by mouth at bedtime.   Omega-3 Fatty Acids (FISH OIL PO) Take 1 capsule by mouth daily.   omeprazole (PRILOSEC) 40 MG capsule Take 1 capsule (40 mg total) by mouth daily as needed (reflux).   rosuvastatin (CRESTOR) 5 MG tablet Take 1 tablet (5 mg total) by mouth daily.   Semaglutide,0.25 or 0.5MG/DOS, (OZEMPIC, 0.25 OR 0.5 MG/DOSE,) 2 MG/1.5ML SOPN Inject 0.5 mg into the skin once a week.   No facility-administered encounter medications on file as of 01/18/2023.   BP Readings from Last  3 Encounters:  01/11/23 122/68  11/13/22 (!) 168/76  10/18/22 136/70    Pulse Readings from Last 3 Encounters:  01/11/23 75  11/13/22 65  10/18/22 63    Lab Results  Component Value Date/Time   HGBA1C 6.3 (H) 08/14/2022 01:43 AM   HGBA1C 7.2 (A) 02/15/2022 10:34 AM   HGBA1C 6.9 (H) 07/21/2021 07:43 AM   Lab Results  Component Value Date   CREATININE 1.14 08/18/2022   BUN 19 08/18/2022   GFR 61.90 08/18/2022   GFRNONAA 59 (L) 08/16/2022   GFRAA >60 01/27/2020   NA 139 08/18/2022   K 4.5 08/18/2022   CALCIUM 9.4  08/18/2022   CO2 26 08/18/2022   Charlene Brooke, PharmD notified  Marijean Niemann, [place] [facility] Assistant [phone]

## 2023-01-23 ENCOUNTER — Telehealth: Payer: Medicare HMO

## 2023-01-24 ENCOUNTER — Ambulatory Visit: Payer: Self-pay

## 2023-01-24 NOTE — Patient Outreach (Signed)
  Care Coordination   Follow Up Visit Note   01/24/2023 Name: Devin Huffman MRN: [id number] DOB: 27-Nov-1946  Devin Huffman is a 77 y.o. year old male who sees Ria Bush, MD for primary care. I spoke with  [name] by phone today.  What matters to the patients health and wellness today?  Patient states he feels his goals have been met and agrees to closing out to care coordination services.     Goals Addressed             This Visit's Progress    COMPLETED: Patient states: Managing my health conditions       Interventions Today    Flowsheet Row Most Recent Value  Chronic Disease   Chronic disease during today's visit Congestive Heart Failure (CHF), Atrial Fibrillation (AFib)  General Interventions   General Interventions Discussed/Reviewed General Interventions Reviewed       Care Coordination Interventions:  Evaluation of current treatment plan related to atrial fibrillation/ heart failure and patient's adherence to plan as established by provider:  Patient states he is doing good other than trying to get over virus/ cold. He reports completing the antibiotic prescribed by his provider. Patient denies any atrial fibrillation/ heart failure symptoms.  He states his weight stays around 218-220.  Reports pulse rate ranges 60-70's.  Patient states he feels overall he is doing well.  Patient advised to continue to monitor pulse, oxygen level and weigh daily and record.  Advised to notify provider for mild/ moderate symptoms and call 911 for severe symptoms.            SDOH assessments and interventions completed:  No     Care Coordination Interventions:  Yes, provided   Follow up plan: No further intervention required.   Encounter Outcome:  Pt. Visit Completed   Quinn Plowman RN,BSN,CCM [place] [phone] direct line

## 2023-02-19 ENCOUNTER — Telehealth: Payer: Self-pay

## 2023-02-19 NOTE — Progress Notes (Signed)
Care Management & Coordination Services Pharmacy Team  Reason for Encounter: Medication Coordination and Delivery  Contacted patient to discuss medications and coordinate delivery from [facility].  Unsuccessful outreach. Left voicemail for patient to return call.  Cycle dispensing form sent to Regan Lemming, PharmD for review.   Last adherence delivery date: 01/31/2023      Patient is due for next adherence delivery on: 03/01/2023  This delivery to include: Vials  90 Days  VIAL medications: Eliquis 5 mg  take 1 tablet twice daily  Rosuvastatin 5 mg - 1 tablet daily Metoprolol 25 mg- 1 tablet daily at bedtime  Metformin 1000mg  - 1 tablet twice daily  Diltiazem 240mg  take 1 tablet daily  Omeprazole 40mg  - take 1 tablet as needed   Patient declined the following medications this month: None declined   Purchases OTC: Vitamin supplements, D3,C,B12,CoQ10,Fish oil   Receives from manufacturer:   Ozempic  No refill request needed.  Delivery scheduled for 03/01/2023. Unable to speak with patient to confirm date.    Chart review: Recent office visits:  None since last contact  Recent consult visits:  None since last contact  Hospital visits:  None since last contact  Medications: Outpatient Encounter Medications as of 02/19/2023  Medication Sig   amoxicillin (AMOXIL) 500 MG capsule Take 2 capsules (1,000 mg total) by mouth 2 (two) times daily. I fill if not improving after 2 to 3 days.  Void after January 24, 2023   apixaban (ELIQUIS) 5 MG TABS tablet Take 1 tablet (5 mg total) by mouth 2 (two) times daily.   Ascorbic Acid (VITAMIN C) 1000 MG tablet Take 1,000 mg by mouth every evening.    blood glucose meter kit and supplies KIT Dispense based on patient and insurance preference. Use to check sugars up to twice daily as directed (E11.69).   Calcium Carbonate Antacid (TUMS CHEWY BITES PO) Take 2 tablets by mouth at bedtime as needed (reflux).   Cholecalciferol  (VITAMIN D3) 25 MCG (1000 UT) CAPS Take 1,000 Units by mouth every evening.   Coenzyme Q10 (CO Q 10 PO) Take 1 capsule by mouth at bedtime.   Cyanocobalamin (VITAMIN B-12 PO) Take 1 capsule by mouth at bedtime.   diltiazem (CARDIZEM CD) 240 MG 24 hr capsule Take 1 capsule (240 mg total) by mouth daily.   glucose blood (GLUCOSE METER TEST) test strip Use as instructed to check sugars twice daily as needed E11.69   metFORMIN (GLUCOPHAGE) 1000 MG tablet Take 1 tablet (1,000 mg total) by mouth 2 (two) times daily with a meal.   metoprolol tartrate (LOPRESSOR) 25 MG tablet Take 1 tablet (25 mg total) by mouth 2 (two) times daily.   Multiple Vitamins-Minerals (EYE VITAMINS PO) Take 2 tablets by mouth at bedtime.   Omega-3 Fatty Acids (FISH OIL PO) Take 1 capsule by mouth daily.   omeprazole (PRILOSEC) 40 MG capsule Take 1 capsule (40 mg total) by mouth daily as needed (reflux).   rosuvastatin (CRESTOR) 5 MG tablet Take 1 tablet (5 mg total) by mouth daily.   Semaglutide,0.25 or 0.5MG /DOS, (OZEMPIC, 0.25 OR 0.5 MG/DOSE,) 2 MG/1.5ML SOPN Inject 0.5 mg into the skin once a week.   No facility-administered encounter medications on file as of 02/19/2023.   BP Readings from Last 3 Encounters:  01/11/23 122/68  11/13/22 (!) 168/76  10/18/22 136/70    Pulse Readings from Last 3 Encounters:  01/11/23 75  11/13/22 65  10/18/22 63    Lab Results  Component  Value Date/Time   HGBA1C 6.3 (H) 08/14/2022 01:43 AM   HGBA1C 7.2 (A) 02/15/2022 10:34 AM   HGBA1C 6.9 (H) 07/21/2021 07:43 AM   Lab Results  Component Value Date   CREATININE 1.14 08/18/2022   BUN 19 08/18/2022   GFR 61.90 08/18/2022   GFRNONAA 59 (L) 08/16/2022   GFRAA >60 01/27/2020   NA 139 08/18/2022   K 4.5 08/18/2022   CALCIUM 9.4 08/18/2022   CO2 26 08/18/2022   Charlene Brooke, CPP notified   Marijean Niemann, [place] [facility] Assistant [phone]

## 2023-02-23 ENCOUNTER — Telehealth: Payer: Self-pay

## 2023-02-23 NOTE — Progress Notes (Signed)
Care Management & Coordination Services Pharmacy Team  Reason for Encounter: Appointment Reminder  Contacted patient to confirm telephone appointment with Charlene Brooke, PharmD on 02/28/2023 at 11:00.  Unsuccessful outreach. Left voicemail for patient to return call.  Star Rating Drugs:  Medication:  Last Fill: Day Supply Metformin 1000 mg 01/26/2023 30 Rosuvastatin 5 mg 01/26/2023 30 Ozempic 0.5 mg PAP  Care Gaps: Annual wellness visit in last year? Yes 08/01/2023  If Diabetic: Last eye exam / retinopathy screening: Up to date Last diabetic foot exam: Overdue  Charlene Brooke, PharmD notified  Marijean Niemann, [place] Assistant [phone]

## 2023-02-28 ENCOUNTER — Ambulatory Visit: Payer: Medicare HMO | Admitting: Pharmacist

## 2023-02-28 NOTE — Progress Notes (Unsigned)
Care Management & Coordination Services Pharmacy Note  02/28/2023 Name:  Devin Huffman MRN:  [id number] DOB:  1946/06/03  Summary: -HF: pt is weighing daily, wt stable 220-221 lbs, he has not needed Lasix lately -HTN: BP has fluctated in recent OV (168/87, 122/68 in 2 most recent), he is not checking BP at home -DM: A1c 6.3% (08/2022) at goal; pt checks BG 1-2x weekly, fasting BG 138  Recommendations/Changes made from today's visit: -No med changes -Advised to monitor BP several times a week at home -Consider SGLT2-I for combined HF/DM benefit in future  Follow up plan: -[facility] Concierge will call patient 3 months for HF update -Pharmacist follow up televisit scheduled for 6 months -PCP annual due 10/2023   Subjective: Devin Huffman is an 77 y.o. year old male who is a primary patient of Ria Bush, MD.  The care coordination team was consulted for assistance with disease management and care coordination needs.    Engaged with patient by telephone for follow up visit.  Recent office visits: 01/11/23 Dr Diona Browner OV: acute cough - rx amoxicillin.  10/18/22 Dr Danise Mina OV: annual - stable. No changes. UACR normal.  Recent consult visits: 11/13/22 Dr Percival Spanish (Cardiology): f/u HF, Afib - pt declines additional meds for GDMT. BP 168/76, keep BP log.  Hospital visits: None in previous 6 months   Objective:  Lab Results  Component Value Date   CREATININE 1.14 08/18/2022   BUN 19 08/18/2022   GFR 61.90 08/18/2022   GFRNONAA 59 (L) 08/16/2022   GFRAA >60 01/27/2020   NA 139 08/18/2022   K 4.5 08/18/2022   CALCIUM 9.4 08/18/2022   CO2 26 08/18/2022   GLUCOSE 116 (H) 08/18/2022    Lab Results  Component Value Date/Time   HGBA1C 6.3 (H) 08/14/2022 01:43 AM   HGBA1C 7.2 (A) 02/15/2022 10:34 AM   HGBA1C 6.9 (H) 07/21/2021 07:43 AM   FRUCTOSAMINE 219 02/24/2020 08:10 AM   GFR 61.90 08/18/2022 08:08 AM   GFR 81.73 07/21/2021 07:43 AM   MICROALBUR 3.5 (H) 10/18/2022  09:48 AM   MICROALBUR 1.0 07/12/2020 11:41 AM    Last diabetic Eye exam:  Lab Results  Component Value Date/Time   HMDIABEYEEXA No Retinopathy 10/31/2022 12:00 AM    Last diabetic Foot exam: No results found for: "HMDIABFOOTEX"   Lab Results  Component Value Date   CHOL 105 08/18/2022   HDL 38.60 (L) 08/18/2022   LDLCALC 34 08/18/2022   LDLDIRECT 45.0 07/21/2021   TRIG 163.0 (H) 08/18/2022   CHOLHDL 3 08/18/2022       Latest Ref Rng & Units 08/15/2022    7:46 AM 08/14/2022    1:43 AM 07/21/2021    7:43 AM  Hepatic Function  Total Protein 6.5 - 8.1 g/dL  6.1  6.5   Albumin 3.5 - 5.0 g/dL 3.3  3.4  4.2   AST 15 - 41 U/L  13  16   ALT 0 - 44 U/L  16  22   Alk Phosphatase 38 - 126 U/L  48  64   Total Bilirubin 0.3 - 1.2 mg/dL  0.9  0.7     Lab Results  Component Value Date/Time   TSH 3.429 08/14/2022 01:43 AM   TSH 2.16 07/10/2019 10:18 AM   TSH 0.91 08/31/2016 03:39 PM       Latest Ref Rng & Units 08/14/2022    1:43 AM 08/13/2022   10:30 AM 02/24/2020    8:10 AM  CBC  WBC  4.0 - 10.5 K/uL 12.3  10.8  9.3   Hemoglobin 13.0 - 17.0 g/dL 13.6  13.2  14.4   Hematocrit 39.0 - 52.0 % 40.3  39.3  42.8   Platelets 150 - 400 K/uL 223  201  344.0     Lab Results  Component Value Date/Time   VITAMINB12 511 07/21/2021 07:43 AM   VITAMINB12 415 07/05/2020 07:59 AM    Clinical ASCVD: No  The ASCVD Risk score (Arnett DK, et al., 2019) failed to calculate for the following reasons:   The valid total cholesterol range is 130 to 320 mg/dL    CHA2DS2/VAS Stroke Risk Points  Current as of 3 days ago     5 >= 2 Points: High Risk     Points Metrics  1 Has Congestive Heart Failure:  Yes    Current as of 3 days ago  0 Has Vascular Disease:  No    Current as of 3 days ago  1 Has Hypertension:  Yes    Current as of 3 days ago  2 Age:  29    Current as of 3 days ago  1 Has Diabetes:  Yes    Current as of 3 days ago  0 Had Stroke:  No  Had TIA:  No  Had Thromboembolism:  No     Current as of 3 days ago  0 Male:  No    Current as of 3 days ago        01/11/2023    2:44 PM 07/31/2022    1:01 PM 06/02/2022    9:11 AM  Depression screen PHQ 2/9  Decreased Interest 0 0 0  Down, Depressed, Hopeless 0 0 0  PHQ - 2 Score 0 0 0  Altered sleeping 0 0   Tired, decreased energy 0 0   Change in appetite 0 0   Feeling bad or failure about yourself  0 0   Trouble concentrating 0 0   Moving slowly or fidgety/restless 0 0   Suicidal thoughts 0 0   PHQ-9 Score 0 0   Difficult doing work/chores Not difficult at all Not difficult at all      Social History   Tobacco Use  Smoking Status Never  Smokeless Tobacco Never   BP Readings from Last 3 Encounters:  01/11/23 122/68  11/13/22 (!) 168/76  10/18/22 136/70   Pulse Readings from Last 3 Encounters:  01/11/23 75  11/13/22 65  10/18/22 63   Wt Readings from Last 3 Encounters:  01/11/23 240 lb (108.9 kg)  11/13/22 223 lb (101.2 kg)  10/18/22 220 lb 9.6 oz (100.1 kg)   BMI Readings from Last 3 Encounters:  01/11/23 33.47 kg/m  11/13/22 31.10 kg/m  10/18/22 32.11 kg/m    Allergies  Allergen Reactions   Lisinopril Hives and Swelling    angioedema - lip swelling, hives    Medications Reviewed Today     Reviewed by Charlton Haws, RPH (Pharmacist) on 02/28/23 at 1352  Med List Status: <None>   Medication Order Taking? Sig Documenting Provider Last Dose Status Informant  apixaban (ELIQUIS) 5 MG TABS tablet [id number] Yes Take 1 tablet (5 mg total) by mouth 2 (two) times daily. Ria Bush, MD Taking Active   Ascorbic Acid (VITAMIN C) 1000 MG tablet [id number] Yes Take 1,000 mg by mouth every evening.  [provider] Taking Active Spouse/Significant Other, Self, Pharmacy Records  blood glucose meter kit and supplies KIT [id number] Yes Dispense based on  patient and insurance preference. Use to check sugars up to twice daily as directed (E11.69). Ria Bush, MD Taking Active Self,  Spouse/Significant Other, Pharmacy Records  Calcium Carbonate Antacid (TUMS CHEWY BITES PO) [id number] Yes Take 2 tablets by mouth at bedtime as needed (reflux). [provider] Taking Active Self, Spouse/Significant Other, Pharmacy Records  Cholecalciferol (VITAMIN D3) 25 MCG (1000 UT) CAPS [id number] Yes Take 1,000 Units by mouth every evening. [provider] Taking Active Self, Spouse/Significant Other, Pharmacy Records  Coenzyme Q10 (CO Q 10 PO) [id number] Yes Take 1 capsule by mouth at bedtime. [provider] Taking Active Self, Spouse/Significant Other, Pharmacy Records  Cyanocobalamin (VITAMIN B-12 PO) [id number] Yes Take 1 capsule by mouth at bedtime. [provider] Taking Active Self, Spouse/Significant Other, Pharmacy Records  diltiazem (CARDIZEM CD) 240 MG 24 hr capsule [id number] Yes Take 1 capsule (240 mg total) by mouth daily. Ria Bush, MD Taking Active   glucose blood (GLUCOSE METER TEST) test strip [id number] Yes Use as instructed to check sugars twice daily as needed E11.69 Ria Bush, MD Taking Active Self, Spouse/Significant Other, Pharmacy Records  metFORMIN (GLUCOPHAGE) 1000 MG tablet [id number] Yes Take 1 tablet (1,000 mg total) by mouth 2 (two) times daily with a meal. Ria Bush, MD Taking Active   metoprolol tartrate (LOPRESSOR) 25 MG tablet [id number] Yes Take 1 tablet (25 mg total) by mouth 2 (two) times daily. Ria Bush, MD Taking Active   Multiple Vitamins-Minerals (EYE VITAMINS PO) [id number] Yes Take 2 tablets by mouth at bedtime. [provider] Taking Active Self, Spouse/Significant Other, Pharmacy Records  Omega-3 Fatty Acids (FISH OIL PO) [id number] Yes Take 1 capsule by mouth daily. [provider] Taking Active Self, Spouse/Significant Other, Pharmacy Records  omeprazole (PRILOSEC) 40 MG capsule [id number] Yes Take 1 capsule (40 mg total) by mouth daily as needed (reflux). Ria Bush,  MD Taking Active   rosuvastatin (CRESTOR) 5 MG tablet [id number] Yes Take 1 tablet (5 mg total) by mouth daily. Ria Bush, MD Taking Active   Semaglutide,0.25 or 0.5MG /DOS, (OZEMPIC, 0.25 OR 0.5 MG/DOSE,) 2 MG/1.5ML SOPN [id number] Yes Inject 0.5 mg into the skin once a week. Ria Bush, MD Taking Active             SDOH:  (Social Determinants of Health) assessments and interventions performed: No SDOH Interventions    Flowsheet Row Clinical Support from 07/31/2022 in [place] at [place] from 07/14/2021 in [place] at [place] from 07/12/2020 in [place] at [place] Management from 06/17/2020 in [place] at [place] Interventions Intervention Not Indicated -- -- --  Housing Interventions Intervention Not Indicated -- -- --  Transportation Interventions Intervention Not Indicated -- -- --  Depression Interventions/Treatment  -- PHQ2-9 Score <4 Follow-up Not Indicated PHQ2-9 Score <4 Follow-up Not Indicated --  Financial Strain Interventions Intervention Not Indicated -- -- Intervention Not Indicated  Physical Activity Interventions Intervention Not Indicated -- -- --  Stress Interventions Intervention Not Indicated -- -- --  Social Connections Interventions Intervention Not Indicated -- -- --       Medication Assistance:  [place] PAP approved 2024  Medication Access: Within the past 30 days, how often has patient missed a dose of medication? 0 Is a pillbox or other method used to improve adherence? Yes  Factors that may affect medication adherence?  no barriers identified Are meds synced by current pharmacy? Yes  Are meds delivered by current pharmacy? Yes  Does patient experience delays in picking up medications due to transportation concerns? No   [facility]  Reviewed: Is patient disadvantaged to use [facility]?: No  Current Rx insurance plan: [employer] Name and location of Current pharmacy:  [facility] - [place], [place] - [address] [place] [place] [address] Phone: [phone] Fax: [phone]  [id number] - [place], [address] [address] Phone: [phone] Fax: [phone]  [facility] services reviewed with patient today?: Yes  VIAL medications: Delivery 03/01/23 x 90 ds Eliquis 5 mg  take 1 tablet twice daily  Rosuvastatin 5 mg - 1 tablet daily Metoprolol 25 mg- 1 tablet daily at bedtime  Metformin 1000mg  - 1 tablet twice daily  Diltiazem 240mg  take 1 tablet daily  Omeprazole 40mg  - take 1 tablet as needed  Compliance/Adherence/Medication fill history: Care Gaps: Colonoscopy (due 01/2023)  Star-Rating Drugs: Metformin - PDC 99% Rosuvastatin - PDC 99%   ASSESSMENT / PLAN  Hypertension / Heart failure (BP goal <130/80) -Query Controlled -  BP fluctuates in clinic (168/87, 122/68 in 2 most recent OV); pt is not checking BP at home; new HF diagnosis in recent hospitalization 08/2022 -Home BP: not checking -Checking daily weight: 220, 221;  pt has been instructed to take lasix for 3-4# wt gain, he has not needed this lately -Last ejection fraction: 50-55% (Date: 08/2022 - new Dx) -HF type: Diastolic/Preserved EF; NYHA Class: not evaluated yet -Current treatment: Metoprolol tartrate 25 mg BID - Appropriate, Effective, Safe, Accessible Diltiazem CD 240 mg daily - Appropriate, Effective, Safe, Accessible Furosemide 20 mg daily PRN - Appropriate, Effective, Safe, Accessible -Medications previously tried: lisinopril (angioedema), amlodipine -Educated on BP goals and benefits of medications for prevention of heart attack, stroke and kidney damage; -Educated on heart failure diagnosis and clinical  manifestations -Counseled to monitor BP and weight at home daily -Recommended to continue current medication  Atrial Fibrillation (Goal: prevent stroke and major bleeding) -Controlled - per pt -New dx 08/2022; CHADSVASC: 5 -Home BP and HR readings: pulse 65  -O2sat 95-98% -Current treatment: Metoprolol tartrate 25 mg BID - Appropriate, Effective, Safe, Accessible Diltiazem CD 240 mg daily - Appropriate, Effective, Safe, Accessible Eliquis 5 mg BID -Appropriate, Effective, Safe, Accessible -Medications previously tried: n/a -Counseled on increased risk of stroke due to Afib and benefits of anticoagulation for stroke prevention; importance of adherence to anticoagulant exactly as prescribed; bleeding risk associated with Eliquis and importance of self-monitoring for signs/symptoms of bleeding; avoidance of NSAIDs due to increased bleeding risk with anticoagulants; -Assessed pt finances - he will not qualify for Eliquis PAP due to 3% OOP requirement -Recommended to continue current medication; advised to monitor BP/[place] daily, contact provider if pulse < 55  Hyperlipidemia: (LDL goal < 70) -Controlled - LDL 34 (08/2022) at goal; pt affirms compliance as below -Current treatment: Rosuvastatin 5 mg daily -Appropriate, Effective, Safe, Accessible Coenezyme Q10 -Appropriate, Effective, Safe, Accessible OTC Omega 3 fish oil -Appropriate, Effective, Safe, Accessible -Medications previously tried: aspirin (stopped d/t Eliquis) -Educated on Cholesterol goals;  -Recommended to continue current medication  Diabetes (A1c goal <7%) -Controlled - A1c 6.3% (08/2022);  -Pt saw a nutritionist, he thinks it may help some -Current home glucose readings - checking 1-2x weekly fasting glucose: 138 -Current medications: Ozempic 0.5 mg weekly (PAP) - Appropriate, Query Effective Metformin  1000 mg BID - Appropriate, Effective, Safe, Accessible -Medications previously tried: Trulicity (cost in donut hole),  Novolog 70/30 -Educated on A1c and blood sugar goals; Benefits of routine self-monitoring of blood sugar; -Recommend to continue current medication  Health Maintenance -Vaccine gaps: covid booster   Charlene Brooke, PharmD, [place] Pharmacist [place] at [facility] [phone]

## 2023-03-01 NOTE — Patient Instructions (Signed)
Visit Information  Phone number for Pharmacist: [phone]  Thank you for meeting with me to discuss your medications! Below is a summary of what we talked about during the visit:   Recommendations/Changes made from today's visit: -No med changes -Advised to monitor BP several times a week at home  Follow up plan: -[place] will call patient 3 months for HF update -Pharmacist follow up televisit scheduled for 6 months -PCP annual due 10/2023   Charlene Brooke, PharmD, BCACP Clinical Pharmacist [facility] at [facility] [phone]

## 2023-03-20 ENCOUNTER — Telehealth: Payer: Self-pay

## 2023-03-20 NOTE — Progress Notes (Signed)
Care Management & Coordination Services Pharmacy Team  Reason for Encounter: Medication Coordination and Delivery  Contacted patient to discuss medications and coordinate delivery from [facility].  Spoke with patient on 03/20/2023   Cycle dispensing form sent to [facility], [facility] for review.   Last adherence delivery date: 03/01/2023      Patient is due for next adherence delivery on: 04/02/2023  This delivery to include: Vials  90 Days  VIAL medications: Eliquis 5 mg  take 1 tablet twice daily  Rosuvastatin 5 mg - 1 tablet daily Metoprolol 25 mg- 1 tablet daily at bedtime  Metformin 1000mg  - 1 tablet twice daily  Diltiazem 240mg  take 1 tablet daily  Omeprazole 40mg  - take 1 tablet as needed   Patient declined the following medications this month: None declined   Purchases OTC: Vitamin supplements, D3, C, B12, CoQ10, Fish oil   Receives from manufacturer:   Ozempic  No refill request needed.  Confirmed delivery date of 04/02/2023, advised patient that pharmacy will contact them the morning of delivery.   Any concerns about your medications? No  How often do you forget or accidentally miss a dose? Never  Do you use a pillbox? Yes  Is patient in packaging No   Recent blood pressure readings are as follows: I have asked patient to take their blood pressure daily and keep a log. Advised patient I would call back on 03/26/23 for log. Patient verbalized understanding and agreed.   Recent blood glucose readings are as follows: I have asked patient to take their blood glucose daily and keep a log. Advised patient I would call back on 03/26/23 for log. Patient verbalized understanding and agreed.   Chart review: Recent office visits:  None since last contact  Recent consult visits:  None since last contact  Hospital visits:  None in previous 6 months  Medications: Outpatient Encounter Medications as of 03/20/2023  Medication Sig Note   apixaban (ELIQUIS) 5  MG TABS tablet Take 1 tablet (5 mg total) by mouth 2 (two) times daily.    Ascorbic Acid (VITAMIN C) 1000 MG tablet Take 1,000 mg by mouth every evening.     blood glucose meter kit and supplies KIT Dispense based on patient and insurance preference. Use to check sugars up to twice daily as directed (E11.69).    Calcium Carbonate Antacid (TUMS CHEWY BITES PO) Take 2 tablets by mouth at bedtime as needed (reflux).    Cholecalciferol (VITAMIN D3) 25 MCG (1000 UT) CAPS Take 1,000 Units by mouth every evening.    Coenzyme Q10 (CO Q 10 PO) Take 1 capsule by mouth at bedtime.    Cyanocobalamin (VITAMIN B-12 PO) Take 1 capsule by mouth at bedtime.    diltiazem (CARDIZEM CD) 240 MG 24 hr capsule Take 1 capsule (240 mg total) by mouth daily.    glucose blood (GLUCOSE METER TEST) test strip Use as instructed to check sugars twice daily as needed E11.69    metFORMIN (GLUCOPHAGE) 1000 MG tablet Take 1 tablet (1,000 mg total) by mouth 2 (two) times daily with a meal.    metoprolol tartrate (LOPRESSOR) 25 MG tablet Take 1 tablet (25 mg total) by mouth 2 (two) times daily.    Multiple Vitamins-Minerals (EYE VITAMINS PO) Take 2 tablets by mouth at bedtime.    Omega-3 Fatty Acids (FISH OIL PO) Take 1 capsule by mouth daily.    omeprazole (PRILOSEC) 40 MG capsule Take 1 capsule (40 mg total) by mouth daily as needed (  reflux).    rosuvastatin (CRESTOR) 5 MG tablet Take 1 tablet (5 mg total) by mouth daily.    Semaglutide,0.25 or 0.5MG /DOS, (OZEMPIC, 0.25 OR 0.5 MG/DOSE,) 2 MG/1.5ML SOPN Inject 0.5 mg into the skin once a week. 02/28/2023: Novo Cares PAP   No facility-administered encounter medications on file as of 03/20/2023.   BP Readings from Last 3 Encounters:  01/11/23 122/68  11/13/22 (!) 168/76  10/18/22 136/70    Pulse Readings from Last 3 Encounters:  01/11/23 75  11/13/22 65  10/18/22 63    Lab Results  Component Value Date/Time   HGBA1C 6.3 (H) 08/14/2022 01:43 AM   HGBA1C 7.2 (A) 02/15/2022  10:34 AM   HGBA1C 6.9 (H) 07/21/2021 07:43 AM   Lab Results  Component Value Date   CREATININE 1.14 08/18/2022   BUN 19 08/18/2022   GFR 61.90 08/18/2022   GFRNONAA 59 (L) 08/16/2022   GFRAA >60 01/27/2020   NA 139 08/18/2022   K 4.5 08/18/2022   CALCIUM 9.4 08/18/2022   CO2 26 08/18/2022   Al Corpus, CPP notified   Claudina Lick, [place] Clinical Pharmacy Assistant [phone]

## 2023-03-28 NOTE — Progress Notes (Signed)
I called patient in regards to his blood pressure and blood glucose logs as previously discussed. No answer; left message.   Al Corpus, PharmD notified  Claudina Lick, [place] Clinical Pharmacy Assistant [phone]

## 2023-04-19 ENCOUNTER — Telehealth: Payer: Self-pay | Admitting: Family Medicine

## 2023-04-19 ENCOUNTER — Telehealth: Payer: Self-pay

## 2023-04-19 NOTE — Telephone Encounter (Signed)
Patient's wife dropped of proof of shingrix vaccination document. Documents placed in provider's box in front office to update patient's chart.

## 2023-04-19 NOTE — Progress Notes (Signed)
Care Management & Coordination Services Pharmacy Team  Reason for Encounter: Medication Coordination and Delivery  Contacted patient to discuss medications and coordinate delivery from [facility].  Spoke with patient on 04/19/2023   Cycle dispensing form sent to [facility], [facility] for review.   Last adherence delivery date: 04/02/2023      Patient is due for next adherence delivery on: 05/01/2023  This delivery to include: Vials  90 Days  VIAL medications: Eliquis 5 mg  take 1 tablet twice daily  Rosuvastatin 5 mg - 1 tablet daily Metoprolol 25 mg- 1 tablet daily at bedtime  Metformin 1000mg  - 1 tablet twice daily  Diltiazem 240mg  take 1 tablet daily  Omeprazole 40mg  - take 1 tablet as needed   Patient declined the following medications this month: None declined   Purchases OTC: Vitamin supplements, D3, C, B12, CoQ10, Fish oil   Receives from manufacturer:   Ozempic  No refill request needed.  Confirmed delivery date of 05/01/2023, advised patient that pharmacy will contact them the morning of delivery. Patient would like delivery on 04/27/2023 as he will be out of town the week of the 20th.   Any concerns about your medications? No  How often do you forget or accidentally miss a dose? Never  Do you use a pillbox? Yes  Is patient in packaging No   Recent blood pressure readings are as follows: Patient was at [facility] and did not have readings.   Recent blood glucose readings are as follows:  Patient was at [facility] and did not have readings.   Chart review: Recent office visits:  None since last contact  Recent consult visits:  None since last contact  Hospital visits:  None in previous 6 months  Medications: Outpatient Encounter Medications as of 04/19/2023  Medication Sig Note   apixaban (ELIQUIS) 5 MG TABS tablet Take 1 tablet (5 mg total) by mouth 2 (two) times daily.    Ascorbic Acid (VITAMIN C) 1000 MG tablet Take 1,000 mg by mouth every  evening.     blood glucose meter kit and supplies KIT Dispense based on patient and insurance preference. Use to check sugars up to twice daily as directed (E11.69).    Calcium Carbonate Antacid (TUMS CHEWY BITES PO) Take 2 tablets by mouth at bedtime as needed (reflux).    Cholecalciferol (VITAMIN D3) 25 MCG (1000 UT) CAPS Take 1,000 Units by mouth every evening.    Coenzyme Q10 (CO Q 10 PO) Take 1 capsule by mouth at bedtime.    Cyanocobalamin (VITAMIN B-12 PO) Take 1 capsule by mouth at bedtime.    diltiazem (CARDIZEM CD) 240 MG 24 hr capsule Take 1 capsule (240 mg total) by mouth daily.    glucose blood (GLUCOSE METER TEST) test strip Use as instructed to check sugars twice daily as needed E11.69    metFORMIN (GLUCOPHAGE) 1000 MG tablet Take 1 tablet (1,000 mg total) by mouth 2 (two) times daily with a meal.    metoprolol tartrate (LOPRESSOR) 25 MG tablet Take 1 tablet (25 mg total) by mouth 2 (two) times daily.    Multiple Vitamins-Minerals (EYE VITAMINS PO) Take 2 tablets by mouth at bedtime.    Omega-3 Fatty Acids (FISH OIL PO) Take 1 capsule by mouth daily.    omeprazole (PRILOSEC) 40 MG capsule Take 1 capsule (40 mg total) by mouth daily as needed (reflux).    rosuvastatin (CRESTOR) 5 MG tablet Take 1 tablet (5 mg total) by mouth daily.  Semaglutide,0.25 or 0.5MG /DOS, (OZEMPIC, 0.25 OR 0.5 MG/DOSE,) 2 MG/1.5ML SOPN Inject 0.5 mg into the skin once a week. 02/28/2023: Novo Cares PAP   No facility-administered encounter medications on file as of 04/19/2023.   BP Readings from Last 3 Encounters:  01/11/23 122/68  11/13/22 (!) 168/76  10/18/22 136/70    Pulse Readings from Last 3 Encounters:  01/11/23 75  11/13/22 65  10/18/22 63    Lab Results  Component Value Date/Time   HGBA1C 6.3 (H) 08/14/2022 01:43 AM   HGBA1C 7.2 (A) 02/15/2022 10:34 AM   HGBA1C 6.9 (H) 07/21/2021 07:43 AM   Lab Results  Component Value Date   CREATININE 1.14 08/18/2022   BUN 19 08/18/2022   GFR 61.90  08/18/2022   GFRNONAA 59 (L) 08/16/2022   GFRAA >60 01/27/2020   NA 139 08/18/2022   K 4.5 08/18/2022   CALCIUM 9.4 08/18/2022   CO2 26 08/18/2022   Al Corpus, CPP notified   Claudina Lick, [place] Clinical Pharmacy Assistant [phone]

## 2023-04-19 NOTE — Telephone Encounter (Addendum)
Chart has been updated.

## 2023-04-20 ENCOUNTER — Ambulatory Visit (INDEPENDENT_AMBULATORY_CARE_PROVIDER_SITE_OTHER): Payer: Medicare HMO | Admitting: Nurse Practitioner

## 2023-04-20 ENCOUNTER — Encounter: Payer: Self-pay | Admitting: Nurse Practitioner

## 2023-04-20 VITALS — BP 126/68 | HR 75 | Temp 97.8°F | Resp 16 | Ht 71.0 in | Wt 226.0 lb

## 2023-04-20 DIAGNOSIS — R0982 Postnasal drip: Secondary | ICD-10-CM | POA: Diagnosis not present

## 2023-04-20 DIAGNOSIS — R051 Acute cough: Secondary | ICD-10-CM | POA: Diagnosis not present

## 2023-04-20 LAB — POC COVID19 BINAXNOW: SARS Coronavirus 2 Ag: NEGATIVE

## 2023-04-20 MED ORDER — FLUTICASONE PROPIONATE 50 MCG/ACT NA SUSP: 2.0000 | Freq: Every day | NASAL | 0 refills | Status: DC

## 2023-04-20 MED ORDER — BENZONATATE 100 MG PO CAPS: 100.0000 mg | ORAL_CAPSULE | Freq: Three times a day (TID) | ORAL | 0 refills | Status: DC | PRN

## 2023-04-20 NOTE — Assessment & Plan Note (Signed)
Flonase nasal spray.  Rest drink plenty of fluid.  COVID test was negative in office.

## 2023-04-20 NOTE — Assessment & Plan Note (Addendum)
COVID test in office.  Will send in Flonase and Tessalon Perles 100 mg 3 times daily as needed.  Rest drink plenty of fluid.  Patient will update [place] the beginning of next week for symptom monitoring.  He is concerned that he would not be able to go on a great trip to see the [place] which is scheduled for 04/30/2023

## 2023-04-20 NOTE — Progress Notes (Signed)
Acute Office Visit  Subjective:     Patient ID: Devin Huffman, male    DOB: June 20, 1946, 77 y.o.   MRN: [id number]  Chief Complaint  Patient presents with   Cough   Nasal Congestion    X yesterday     Patient is in today for sick symtpoms with a history of HFpEF, DM2. 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  Later in the office that he did not admit that his wife has been sick for the past several weeks and just finished her antibiotics.  She still coughing Otc treatment : none thus far  Review of Systems  Constitutional:  Positive for malaise/fatigue. Negative for chills and fever.       Appetite is normal  Fluid intake is good   HENT:  Negative for ear discharge, ear pain, sinus pain and sore throat.   Respiratory:  Positive for cough. Negative for sputum production and shortness of breath.   Musculoskeletal:  Negative for joint pain and myalgias.  Neurological:  Negative for headaches.        Objective:    BP 126/68   Pulse 75   Temp 97.8 F (36.6 C)   Resp 16   Ht 5\' 11"  (1.803 m)   Wt 226 lb (102.5 kg)   SpO2 97%   BMI 31.52 kg/m  BP Readings from Last 3 Encounters:  04/20/23 126/68  01/11/23 122/68  11/13/22 (!) 168/76   Wt Readings from Last 3 Encounters:  04/20/23 226 lb (102.5 kg)  01/11/23 240 lb (108.9 kg)  11/13/22 223 lb (101.2 kg)      Physical Exam Vitals and nursing note reviewed.  Constitutional:      Appearance: Normal appearance.  HENT:     Nose:     Right Sinus: No maxillary sinus tenderness or frontal sinus tenderness.     Left Sinus: No maxillary sinus tenderness or frontal sinus tenderness.     Mouth/Throat:     Mouth: Mucous membranes are moist.  Cardiovascular:     Rate and Rhythm: Normal rate and regular rhythm.     Heart sounds: Normal heart sounds.   Pulmonary:     Effort: Pulmonary effort is normal.     Breath sounds: Normal breath sounds.  Lymphadenopathy:     Cervical: No cervical adenopathy.  Neurological:     Mental Status: He is alert.     Results for orders placed or performed in visit on 04/20/23  POC COVID-19 BinaxNow  Result Value Ref Range   SARS Coronavirus 2 Ag Negative Negative        Assessment & Plan:   Problem List Items Addressed This Visit       Other   Acute cough - Primary    COVID test in office.  Will send in Flonase and Tessalon Perles 100 mg 3 times daily as needed.  Rest drink plenty of fluid.  Patient will update [place] the beginning of next week for symptom monitoring.  He is concerned that he would not be able to go on a great trip to [place] which is scheduled for 04/30/2023      Relevant Medications   benzonatate (TESSALON) 100 MG capsule   Other Relevant Orders   POC COVID-19 BinaxNow (Completed)   PND (  post-nasal drip)    Flonase nasal spray.  Rest drink plenty of fluid.  COVID test was negative in office.      Relevant Medications   fluticasone (FLONASE) 50 MCG/ACT nasal spray    Meds ordered this encounter  Medications   fluticasone (FLONASE) 50 MCG/ACT nasal spray    Sig: Place 2 sprays into both nostrils daily.    Dispense:  16 g    Refill:  0    Order Specific Question:   Supervising Provider    Answer:   Milinda Antis MARNE A [[id number]]   benzonatate (TESSALON) 100 MG capsule    Sig: Take 1 capsule (100 mg total) by mouth 3 (three) times daily as needed for cough.    Dispense:  21 capsule    Refill:  0    Order Specific Question:   Supervising Provider    Answer:   TOWER, MARNE A [[id number]]    Return if symptoms worsen or fail to improve.  Audria Nine, NP

## 2023-04-20 NOTE — Patient Instructions (Signed)
Nice to see you today I have sent in some cough pills and a nasal spray This should move out on its own If you get worse or do not improve over the next week let us know

## 2023-04-23 ENCOUNTER — Telehealth: Payer: Self-pay | Admitting: Family Medicine

## 2023-04-23 MED ORDER — AZITHROMYCIN 250 MG PO TABS: ORAL_TABLET | ORAL | 0 refills | Status: AC

## 2023-04-23 NOTE — Telephone Encounter (Signed)
Patient was seen on 03/21/2023 for congestion,and cough. He was prescribed benzonatate (TESSALON) 100 MG capsule . Wife called in today stating that he is still having the congestion,cough,and feeling really bad. No fever. She would like to know if there is something else that can be prescribed for him?She said maybe a z pak?  [id number]ann Sheen, [place] - [address] Phone: [phone]  Fax: [phone]

## 2023-04-23 NOTE — Telephone Encounter (Signed)
I think a zpak is fine. I have sent it to the pharmacy on file [facility]

## 2023-04-23 NOTE — Addendum Note (Signed)
Addended by: Eden Emms on: 04/23/2023 10:16 AM   Modules accepted: Orders

## 2023-04-23 NOTE — Telephone Encounter (Signed)
Fyi, pt was seen on 04/20/23.

## 2023-04-23 NOTE — Telephone Encounter (Signed)
Called and informed pt of this information and he stated that he has already picked up the medication.

## 2023-05-12 ENCOUNTER — Other Ambulatory Visit: Payer: Self-pay | Admitting: Nurse Practitioner

## 2023-05-12 DIAGNOSIS — R0982 Postnasal drip: Secondary | ICD-10-CM

## 2023-05-15 ENCOUNTER — Other Ambulatory Visit: Payer: Self-pay | Admitting: Family Medicine

## 2023-05-15 DIAGNOSIS — K219 Gastro-esophageal reflux disease without esophagitis: Secondary | ICD-10-CM

## 2023-05-16 ENCOUNTER — Telehealth: Payer: Self-pay

## 2023-05-16 NOTE — Progress Notes (Unsigned)
Care Management & Coordination Services Pharmacy Team  Reason for Encounter: Heart Failure update  Contacted patient on 05/16/2023 to discuss heart failure disease state {US HC Outreach:28874}   Current heart failure regimen: ***  Have you had to take an extra dose of diuretic recently? {yes/no:20286} How often: ***  Do you weigh yourself daily or regularly? {yes/no:20286}  Date:  Weight: ***    Have any of the following symptoms worsened or changed from your baseline?  {ZOX:09604}  How often are you checking your Blood Pressure? {CHL HP BP Monitoring Frequency:[phone]}  he checks his blood pressure {timing:25218} {before/after:25217} taking his medication.  DATE:   BP               PULSE ***  Wrist or arm cuff: Caffeine intake: Salt intake: OTC medications including pseudoephedrine or NSAIDs? Exercise habits:     Chart Updates: Recent office visits:  ***  Recent consult visits:  [facility] visits:  {Hospital DC Yes/No:[id number]}  Medications: Outpatient Encounter Medications as of 05/16/2023  Medication Sig Note   apixaban (ELIQUIS) 5 MG TABS tablet Take 1 tablet (5 mg total) by mouth 2 (two) times daily.    Ascorbic Acid (VITAMIN C) 1000 MG tablet Take 1,000 mg by mouth every evening.     benzonatate (TESSALON) 100 MG capsule Take 1 capsule (100 mg total) by mouth 3 (three) times daily as needed for cough.    blood glucose meter kit and supplies KIT Dispense based on patient and insurance preference. Use to check sugars up to twice daily as directed (E11.69).    Calcium Carbonate Antacid (TUMS CHEWY BITES PO) Take 2 tablets by mouth at bedtime as needed (reflux).    Cholecalciferol (VITAMIN D3) 25 MCG (1000 UT) CAPS Take 1,000 Units by mouth every evening.    Coenzyme Q10 (CO Q 10 PO) Take 1 capsule by mouth at bedtime.    Cyanocobalamin (VITAMIN B-12 PO) Take 1 capsule by mouth at bedtime.    diltiazem (CARDIZEM CD) 240 MG 24 hr capsule Take 1 capsule  (240 mg total) by mouth daily.    fluticasone (FLONASE) 50 MCG/ACT nasal spray Place 2 sprays into both nostrils daily.    glucose blood (GLUCOSE METER TEST) test strip Use as instructed to check sugars twice daily as needed E11.69    metFORMIN (GLUCOPHAGE) 1000 MG tablet Take 1 tablet (1,000 mg total) by mouth 2 (two) times daily with a meal.    metoprolol tartrate (LOPRESSOR) 25 MG tablet Take 1 tablet (25 mg total) by mouth 2 (two) times daily.    Multiple Vitamins-Minerals (EYE VITAMINS PO) Take 2 tablets by mouth at bedtime.    Omega-3 Fatty Acids (FISH OIL PO) Take 1 capsule by mouth daily.    omeprazole (PRILOSEC) 40 MG capsule Take 1 capsule (40 mg total) by mouth daily as needed (reflux).    rosuvastatin (CRESTOR) 5 MG tablet Take 1 tablet (5 mg total) by mouth daily.    Semaglutide,0.25 or 0.5MG /DOS, (OZEMPIC, 0.25 OR 0.5 MG/DOSE,) 2 MG/1.5ML SOPN Inject 0.5 mg into the skin once a week. 02/28/2023: Novo Cares PAP   No facility-administered encounter medications on file as of 05/16/2023.    Star Rating Drugs:  Medication:  Last Fill: Day Supply     Recent Office Vitals: BP Readings from Last 3 Encounters:  04/20/23 126/68  01/11/23 122/68  11/13/22 (!) 168/76   Pulse Readings from Last 3 Encounters:  04/20/23 75  01/11/23 75  11/13/22 65  Wt Readings from Last 3 Encounters:  04/20/23 226 lb (102.5 kg)  01/11/23 240 lb (108.9 kg)  11/13/22 223 lb (101.2 kg)     Kidney Function Lab Results  Component Value Date/Time   CREATININE 1.14 08/18/2022 08:08 AM   CREATININE 1.25 (H) 08/16/2022 01:08 PM   GFR 61.90 08/18/2022 08:08 AM   GFRNONAA 59 (L) 08/16/2022 01:08 PM   GFRAA >60 01/27/2020 03:55 AM       Latest Ref Rng & Units 08/18/2022    8:08 AM 08/16/2022    1:08 PM 08/15/2022    7:46 AM  BMP  Glucose 70 - 99 mg/dL 161  096  045   BUN 6 - 23 mg/dL 19  16  16    Creatinine 0.40 - 1.50 mg/dL 4.09  8.11  9.14   Sodium 135 - 145 mEq/L 139  139  139   Potassium 3.5  - 5.1 mEq/L 4.5  3.7  4.1   Chloride 96 - 112 mEq/L 103  105  108   CO2 19 - 32 mEq/L 26  23  21    Calcium 8.4 - 10.5 mg/dL 9.4  9.0  8.8    Al Corpus, PharmD notified  Claudina Lick, [place] Clinical Pharmacy Assistant [phone]

## 2023-05-22 ENCOUNTER — Telehealth: Payer: Self-pay

## 2023-05-22 NOTE — Progress Notes (Signed)
Care Management & Coordination Services Pharmacy Team  Reason for Encounter: Medication Coordination and Delivery  Contacted patient to discuss medications and coordinate delivery from [facility].  Spoke with patient on 05/22/2023   Cycle dispensing form sent to [facility] for review.   Patient is due for next adherence delivery on: 06/09/2023  This delivery to include: Vials  90 Days  VIAL medications: Eliquis 5 mg  take 1 tablet twice daily  Rosuvastatin 5 mg - 1 tablet daily Metoprolol 25 mg- 1 tablet daily at bedtime  Metformin 1000mg  - 1 tablet twice daily  Diltiazem 240mg  take 1 tablet daily  Omeprazole 40mg  - take 1 tablet as needed   Patient declined the following medications this month: None declined   Purchases OTC: Vitamin supplements, D3, C, B12, CoQ10, Fish oil   Receives from manufacturer:   Ozempic   No refill request needed.  Any concerns about your medications? No  How often do you forget or accidentally miss a dose? Never  Do you use a pillbox? Yes  Is patient in packaging No  Medications: Outpatient Encounter Medications as of 05/22/2023  Medication Sig Note   apixaban (ELIQUIS) 5 MG TABS tablet Take 1 tablet (5 mg total) by mouth 2 (two) times daily.    Ascorbic Acid (VITAMIN C) 1000 MG tablet Take 1,000 mg by mouth every evening.     benzonatate (TESSALON) 100 MG capsule Take 1 capsule (100 mg total) by mouth 3 (three) times daily as needed for cough.    blood glucose meter kit and supplies KIT Dispense based on patient and insurance preference. Use to check sugars up to twice daily as directed (E11.69).    Calcium Carbonate Antacid (TUMS CHEWY BITES PO) Take 2 tablets by mouth at bedtime as needed (reflux).    Cholecalciferol (VITAMIN D3) 25 MCG (1000 UT) CAPS Take 1,000 Units by mouth every evening.    Coenzyme Q10 (CO Q 10 PO) Take 1 capsule by mouth at bedtime.    Cyanocobalamin (VITAMIN B-12 PO) Take 1 capsule by mouth at  bedtime.    diltiazem (CARDIZEM CD) 240 MG 24 hr capsule Take 1 capsule (240 mg total) by mouth daily.    fluticasone (FLONASE) 50 MCG/ACT nasal spray Place 2 sprays into both nostrils daily.    glucose blood (GLUCOSE METER TEST) test strip Use as instructed to check sugars twice daily as needed E11.69    metFORMIN (GLUCOPHAGE) 1000 MG tablet Take 1 tablet (1,000 mg total) by mouth 2 (two) times daily with a meal.    metoprolol tartrate (LOPRESSOR) 25 MG tablet Take 1 tablet (25 mg total) by mouth 2 (two) times daily.    Multiple Vitamins-Minerals (EYE VITAMINS PO) Take 2 tablets by mouth at bedtime.    Omega-3 Fatty Acids (FISH OIL PO) Take 1 capsule by mouth daily.    omeprazole (PRILOSEC) 40 MG capsule TAKE ONE CAPSULE BY MOUTH ONCE daily AS NEEDED FOR ACID REFLUX    rosuvastatin (CRESTOR) 5 MG tablet Take 1 tablet (5 mg total) by mouth daily.    Semaglutide,0.25 or 0.5MG /DOS, (OZEMPIC, 0.25 OR 0.5 MG/DOSE,) 2 MG/1.5ML SOPN Inject 0.5 mg into the skin once a week. 02/28/2023: Novo Cares PAP   No facility-administered encounter medications on file as of 05/22/2023.   BP Readings from Last 3 Encounters:  04/20/23 126/68  01/11/23 122/68  11/13/22 (!) 168/76    Pulse Readings from Last 3 Encounters:  04/20/23 75  01/11/23 75  11/13/22 65  Lab Results  Component Value Date/Time   HGBA1C 6.3 (H) 08/14/2022 01:43 AM   HGBA1C 7.2 (A) 02/15/2022 10:34 AM   HGBA1C 6.9 (H) 07/21/2021 07:43 AM   Lab Results  Component Value Date   CREATININE 1.14 08/18/2022   BUN 19 08/18/2022   GFR 61.90 08/18/2022   GFRNONAA 59 (L) 08/16/2022   GFRAA >60 01/27/2020   NA 139 08/18/2022   K 4.5 08/18/2022   CALCIUM 9.4 08/18/2022   CO2 26 08/18/2022   Al Corpus, CPP notified   Claudina Lick, [place] Clinical Pharmacy Assistant [phone]

## 2023-05-29 ENCOUNTER — Ambulatory Visit (INDEPENDENT_AMBULATORY_CARE_PROVIDER_SITE_OTHER): Payer: Medicare HMO | Admitting: Family Medicine

## 2023-05-29 ENCOUNTER — Encounter: Payer: Self-pay | Admitting: Family Medicine

## 2023-05-29 VITALS — BP 160/80 | HR 58 | Temp 97.8°F | Ht 71.0 in | Wt 224.2 lb

## 2023-05-29 DIAGNOSIS — E669 Obesity, unspecified: Secondary | ICD-10-CM | POA: Diagnosis not present

## 2023-05-29 DIAGNOSIS — Z7985 Long-term (current) use of injectable non-insulin antidiabetic drugs: Secondary | ICD-10-CM | POA: Diagnosis not present

## 2023-05-29 DIAGNOSIS — I10 Essential (primary) hypertension: Secondary | ICD-10-CM

## 2023-05-29 DIAGNOSIS — E538 Deficiency of other specified B group vitamins: Secondary | ICD-10-CM | POA: Diagnosis not present

## 2023-05-29 DIAGNOSIS — R5382 Chronic fatigue, unspecified: Secondary | ICD-10-CM

## 2023-05-29 DIAGNOSIS — E1169 Type 2 diabetes mellitus with other specified complication: Secondary | ICD-10-CM

## 2023-05-29 DIAGNOSIS — R0989 Other specified symptoms and signs involving the circulatory and respiratory systems: Secondary | ICD-10-CM | POA: Diagnosis not present

## 2023-05-29 DIAGNOSIS — R29898 Other symptoms and signs involving the musculoskeletal system: Secondary | ICD-10-CM

## 2023-05-29 DIAGNOSIS — E1140 Type 2 diabetes mellitus with diabetic neuropathy, unspecified: Secondary | ICD-10-CM | POA: Diagnosis not present

## 2023-05-29 DIAGNOSIS — I5032 Chronic diastolic (congestive) heart failure: Secondary | ICD-10-CM | POA: Diagnosis not present

## 2023-05-29 DIAGNOSIS — I4891 Unspecified atrial fibrillation: Secondary | ICD-10-CM | POA: Diagnosis not present

## 2023-05-29 LAB — BASIC METABOLIC PANEL
BUN: 21 mg/dL (ref 6–23)
CO2: 22 mEq/L (ref 19–32)
Calcium: 9.4 mg/dL (ref 8.4–10.5)
Chloride: 106 mEq/L (ref 96–112)
Creatinine, Ser: 1.25 mg/dL (ref 0.40–1.50)
GFR: 55.12 mL/min — ABNORMAL LOW (ref >60.00)
Glucose, Bld: 106 mg/dL — ABNORMAL HIGH (ref 70–99)
Potassium: 5.4 mEq/L — ABNORMAL HIGH (ref 3.5–5.1)
Sodium: 138 mEq/L (ref 135–145)

## 2023-05-29 LAB — TSH: TSH: 2.24 u[IU]/mL (ref 0.35–5.50)

## 2023-05-29 LAB — CBC WITH DIFFERENTIAL/PLATELET
Basophils Absolute: 0 10*3/uL (ref 0.0–0.1)
Basophils Relative: 0.3 % (ref 0.0–3.0)
Eosinophils Absolute: 0.3 10*3/uL (ref 0.0–0.7)
Eosinophils Relative: 2.6 % (ref 0.0–5.0)
HCT: 44.2 % (ref 39.0–52.0)
Hemoglobin: 14.6 g/dL (ref 13.0–17.0)
Lymphocytes Relative: 24.1 % (ref 12.0–46.0)
Lymphs Abs: 2.5 10*3/uL (ref 0.7–4.0)
MCHC: 33 g/dL (ref 30.0–36.0)
MCV: 100.3 fl — ABNORMAL HIGH (ref 78.0–100.0)
Monocytes Absolute: 0.9 10*3/uL (ref 0.1–1.0)
Monocytes Relative: 8.3 % (ref 3.0–12.0)
Neutro Abs: 6.6 10*3/uL (ref 1.4–7.7)
Neutrophils Relative %: 64.7 % (ref 43.0–77.0)
Platelets: 277 10*3/uL (ref 150.0–400.0)
RBC: 4.4 Mil/uL (ref 4.22–5.81)
RDW: 12.9 % (ref 11.5–15.5)
WBC: 10.2 10*3/uL (ref 4.0–10.5)

## 2023-05-29 LAB — HEPATIC FUNCTION PANEL
ALT: 22 U/L (ref 0–53)
AST: 26 U/L (ref 0–37)
Albumin: 4.4 g/dL (ref 3.5–5.2)
Alkaline Phosphatase: 65 U/L (ref 39–117)
Bilirubin, Direct: 0.2 mg/dL (ref 0.0–0.3)
Total Bilirubin: 0.6 mg/dL (ref 0.2–1.2)
Total Protein: 7.4 g/dL (ref 6.0–8.3)

## 2023-05-29 LAB — POCT GLYCOSYLATED HEMOGLOBIN (HGB A1C): Hemoglobin A1C: 5.9 % — AB (ref 4.0–5.6)

## 2023-05-29 LAB — VITAMIN B12: Vitamin B-12: 1238 pg/mL — ABNORMAL HIGH (ref 211–911)

## 2023-05-29 LAB — VITAMIN D 25 HYDROXY (VIT D DEFICIENCY, FRACTURES): VITD: 26.94 ng/mL — ABNORMAL LOW (ref 30.00–100.00)

## 2023-05-29 LAB — CK: Total CK: 91 U/L (ref 7–232)

## 2023-05-29 MED ORDER — METFORMIN HCL 500 MG PO TABS: 500.0000 mg | ORAL_TABLET | Freq: Two times a day (BID) | ORAL | 3 refills | Status: DC

## 2023-05-29 MED ORDER — CARVEDILOL 3.125 MG PO TABS: 3.1250 mg | ORAL_TABLET | Freq: Two times a day (BID) | ORAL | 6 refills | Status: DC

## 2023-05-29 NOTE — Assessment & Plan Note (Signed)
Ongoing issue for 6+ months. Check labs for reversible causes of fatigue.  Change BB from metoprolol to carvedilol.

## 2023-05-29 NOTE — Assessment & Plan Note (Signed)
Continues eliquis, BB, CCB.

## 2023-05-29 NOTE — Assessment & Plan Note (Signed)
Ongoing neuropathy symptoms of numbness, tingling, hot feeling without burning pain.  Will drop Ozempic dosing.  Update B12.

## 2023-05-29 NOTE — Patient Instructions (Addendum)
Labs to check for reversible causes of fatigue today Drop metformin to 500mg  twice daily. New dose sent to pharmacy.  Change metoprolol to carvedilol 3.125mg  twice daily.  Return in 6-8 weeks for follow up visit fatigue and in 5 months for physical/wellness visit

## 2023-05-29 NOTE — Assessment & Plan Note (Signed)
Chronic, great control as evidenced by improved A1c - will drop metformin to 500mg  BID, continue Ozempic 0.5mg  weekly which he seems to be tolerating fine.

## 2023-05-29 NOTE — Assessment & Plan Note (Signed)
Chronic, overall stable period. Some bibasilar crackles on exam however no pedal edema noted.

## 2023-05-29 NOTE — Assessment & Plan Note (Addendum)
Update b12 levels on 1000 mcg daily oral replacement He is on metformin and omeprazole both which can cause decreased absorption.

## 2023-05-29 NOTE — Assessment & Plan Note (Signed)
Chronic, BP above goal today - despite metformin and diltiazem. With noted fatigue, change metoprolol to carvedilol. Monitor BP at home. Update with effect.

## 2023-05-29 NOTE — Assessment & Plan Note (Addendum)
Diminished pulses noted without claudication. Check ABIs.

## 2023-05-29 NOTE — Progress Notes (Addendum)
Ph: [phone] Fax: [phone]   Patient ID: Devin Huffman, male    DOB: 05/08/1946, 77 y.o.   MRN: [id number]  This visit was conducted in person.  BP (!) 160/80   Pulse (!) 58   Temp 97.8 F (36.6 C) (Temporal)   Ht 5\' 11"  (1.803 m)   Wt 224 lb 4 oz (101.7 kg)   SpO2 94%   BMI 31.28 kg/m   BP Readings from Last 3 Encounters:  05/29/23 (!) 160/80  04/20/23 126/68  01/11/23 122/68  168/80 on repeat testing  CC: 55mo DM f/u visit  Subjective:   HPI: Devin Huffman is a 77 y.o. male presenting on 05/29/2023 for Medical Management of Chronic Issues (Here for DM follow up. Pt accompanied by wife, Steward Drone. Pt brought in home BP monitor to compare. Reading in office today- 164 98.)   Notes chronic fatigue for the past 6+ months. Wonders if med related.  Eyes feel tired - he's had 2 normal eye exams Elmer Picker). No vision changes or double vision.   DM - does not regularly check sugars: fasting 135-160. Compliant with antihyperglycemic regimen which includes: metformin 1000mg  bid, ozempic 0.5mg  weekly. Tolerating ozempic well - no constipation, nausea, diarrhea, epigastric pain. Denies low sugars or hypoglycemic symptoms. Occ blurry vision, h/o neuropathy. Last diabetic eye exam 10/2022. Glucometer brand: Relion. Last foot exam: 02/2022 - DUE. DSME: declined. Lab Results  Component Value Date   HGBA1C 5.9 (A) 05/29/2023   Diabetic Foot Exam - Simple   Simple Foot Form Diabetic Foot exam was performed with the following findings: Yes 05/29/2023  9:26 AM  Visual Inspection No deformities, no ulcerations, no other skin breakdown bilaterally: Yes Sensation Testing Intact to touch and monofilament testing bilaterally: Yes Pulse Check See comments: Yes Comments Diminished pedal pulses bilaterally No claudication    Lab Results  Component Value Date   MICROALBUR 3.5 (H) 10/18/2022     HTN - Compliant with current antihypertensive regimen of cardizem DC 240mg  daily,  metoprolol 25mg  BID.  Does check blood pressures at home: 140-180/90-100s. No low blood pressure readings or symptoms of dizziness/syncope. Denies HA, vision changes, CP/tightness, SOB, leg swelling.       Relevant past medical, surgical, family and social history reviewed and updated as indicated. Interim medical history since our last visit reviewed. Allergies and medications reviewed and updated. Outpatient Medications Prior to Visit  Medication Sig Dispense Refill   apixaban (ELIQUIS) 5 MG TABS tablet Take 1 tablet (5 mg total) by mouth 2 (two) times daily. 180 tablet 3   Ascorbic Acid (VITAMIN C) 1000 MG tablet Take 1,000 mg by mouth every evening.      blood glucose meter kit and supplies KIT Dispense based on patient and insurance preference. Use to check sugars up to twice daily as directed (E11.69). 1 each 0   Calcium Carbonate Antacid (TUMS CHEWY BITES PO) Take 2 tablets by mouth at bedtime as needed (reflux).     Cholecalciferol (VITAMIN D3) 25 MCG (1000 UT) CAPS Take 1,000 Units by mouth every evening.     Coenzyme Q10 (CO Q 10 PO) Take 1 capsule by mouth at bedtime.     Cyanocobalamin (VITAMIN B-12 PO) Take 1 capsule by mouth at bedtime.     diltiazem (CARDIZEM CD) 240 MG 24 hr capsule Take 1 capsule (240 mg total) by mouth daily. 90 capsule 3   fluticasone (FLONASE) 50 MCG/ACT nasal spray Place 2 sprays into both nostrils daily. 16 g  0   glucose blood (GLUCOSE METER TEST) test strip Use as instructed to check sugars twice daily as needed E11.69 100 each 12   Multiple Vitamins-Minerals (EYE VITAMINS PO) Take 2 tablets by mouth at bedtime.     Omega-3 Fatty Acids (FISH OIL PO) Take 1 capsule by mouth daily.     omeprazole (PRILOSEC) 40 MG capsule TAKE ONE CAPSULE BY MOUTH ONCE daily AS NEEDED FOR ACID REFLUX 30 capsule 6   rosuvastatin (CRESTOR) 5 MG tablet Take 1 tablet (5 mg total) by mouth daily. 90 tablet 3   Semaglutide,0.25 or 0.5MG /DOS, (OZEMPIC, 0.25 OR 0.5 MG/DOSE,) 2  MG/1.5ML SOPN Inject 0.5 mg into the skin once a week. 4.5 mL 3   benzonatate (TESSALON) 100 MG capsule Take 1 capsule (100 mg total) by mouth 3 (three) times daily as needed for cough. 21 capsule 0   metFORMIN (GLUCOPHAGE) 1000 MG tablet Take 1 tablet (1,000 mg total) by mouth 2 (two) times daily with a meal. 180 tablet 3   metoprolol tartrate (LOPRESSOR) 25 MG tablet Take 1 tablet (25 mg total) by mouth 2 (two) times daily. 180 tablet 3   No facility-administered medications prior to visit.     Per HPI unless specifically indicated in ROS section below Review of Systems  Objective:  BP (!) 160/80   Pulse (!) 58   Temp 97.8 F (36.6 C) (Temporal)   Ht 5\' 11"  (1.803 m)   Wt 224 lb 4 oz (101.7 kg)   SpO2 94%   BMI 31.28 kg/m   Wt Readings from Last 3 Encounters:  05/29/23 224 lb 4 oz (101.7 kg)  04/20/23 226 lb (102.5 kg)  01/11/23 240 lb (108.9 kg)      Physical Exam Vitals and nursing note reviewed.  Constitutional:      Appearance: Normal appearance. He is not ill-appearing.  HENT:     Mouth/Throat:     Mouth: Mucous membranes are moist.     Pharynx: Oropharynx is clear. No oropharyngeal exudate or posterior oropharyngeal erythema.  Eyes:     Extraocular Movements: Extraocular movements intact.     Pupils: Pupils are equal, round, and reactive to light.  Neck:     Thyroid: No thyroid mass or thyromegaly.  Cardiovascular:     Rate and Rhythm: Normal rate and regular rhythm.     Pulses: Normal pulses.     Heart sounds: Normal heart sounds. No murmur heard. Pulmonary:     Effort: Pulmonary effort is normal. No respiratory distress.     Breath sounds: No wheezing, rhonchi or rales.     Comments: Bibasilar crackles, partly clear with coughing Musculoskeletal:     Right lower leg: No edema.     Left lower leg: No edema.     Comments: See HPI for foot exam if done  Skin:    General: Skin is warm and dry.     Findings: No rash.  Neurological:     Mental Status: He is  alert.     Sensory: Sensation is intact.     Motor: Motor function is intact.     Coordination: Coordination is intact.     Comments: 5/5 strength BLE  Psychiatric:        Mood and Affect: Mood normal.        Behavior: Behavior normal.       Results for orders placed or performed in visit on 05/29/23  POCT glycosylated hemoglobin (Hb A1C)  Result Value Ref Range  Hemoglobin A1C 5.9 (A) 4.0 - 5.6 %   HbA1c POC (<> result, manual entry)     HbA1c, POC (prediabetic range)     HbA1c, POC (controlled diabetic range)      Assessment & Plan:   Problem List Items Addressed This Visit     Essential hypertension    Chronic, BP above goal today - despite metformin and diltiazem. With noted fatigue, change metoprolol to carvedilol. Monitor BP at home. Update with effect.       Relevant Medications   carvedilol (COREG) 3.125 MG tablet   Type 2 diabetes mellitus with other specified complication (HCC) - Primary    Chronic, great control as evidenced by improved A1c - will drop metformin to 500mg  BID, continue Ozempic 0.5mg  weekly which he seems to be tolerating fine.       Relevant Medications   metFORMIN (GLUCOPHAGE) 500 MG tablet   Other Relevant Orders   POCT glycosylated hemoglobin (Hb A1C) (Completed)   Basic metabolic panel   Vitamin B12   Vitamin B12 deficiency    Update b12 levels on 1000 mcg daily oral replacement He is on metformin and omeprazole both which can cause decreased absorption.       Obesity, Class I, BMI 30-34.9    Congratulated on weight loss to date.       Type 2 diabetes mellitus with diabetic neuropathy, unspecified (HCC)    Ongoing neuropathy symptoms of numbness, tingling, hot feeling without burning pain.  Will drop Ozempic dosing.  Update B12.       Relevant Medications   metFORMIN (GLUCOPHAGE) 500 MG tablet   Atrial fibrillation with RVR (HCC)    Continues eliquis, BB, CCB.      Relevant Medications   carvedilol (COREG) 3.125 MG tablet    (HFpEF) heart failure with preserved ejection fraction (HCC)    Chronic, overall stable period. Some bibasilar crackles on exam however no pedal edema noted.       Relevant Medications   carvedilol (COREG) 3.125 MG tablet   Chronic fatigue    Ongoing issue for 6+ months. Check labs for reversible causes of fatigue.  Change BB from metoprolol to carvedilol.       Relevant Orders   TSH   Basic metabolic panel   CBC with Differential/Platelet   Vitamin B12   Hepatic function panel   VITAMIN D 25 Hydroxy (Vit-D Deficiency, Fractures)   Diminished pulses in lower extremity    Diminished pulses noted without claudication. Check ABIs.       Relevant Orders   VAS [place] ABI WITH/WO TBI   Other Visit Diagnoses     Weakness of both lower extremities       Relevant Orders   CK        Meds ordered this encounter  Medications   metFORMIN (GLUCOPHAGE) 500 MG tablet    Sig: Take 1 tablet (500 mg total) by mouth 2 (two) times daily with a meal. For diabetes    Dispense:  180 tablet    Refill:  3    See new dose   carvedilol (COREG) 3.125 MG tablet    Sig: Take 1 tablet (3.125 mg total) by mouth 2 (two) times daily with a meal.    Dispense:  60 tablet    Refill:  6    To replace metoprolol    Orders Placed This Encounter  Procedures   TSH   Basic metabolic panel   CBC with Differential/Platelet   Vitamin  B12   Hepatic function panel   VITAMIN D 25 Hydroxy (Vit-D Deficiency, Fractures)   CK   POCT glycosylated hemoglobin (Hb A1C)    Patient Instructions  Labs to check for reversible causes of fatigue today Drop metformin to 500mg  twice daily. New dose sent to pharmacy.  Change metoprolol to carvedilol 3.125mg  twice daily.  Return in 6-8 weeks for follow up visit fatigue and in 5 months for physical/wellness visit  Follow up plan: Return in about 6 weeks (around 07/10/2023), or if symptoms worsen or fail to improve, for follow up visit.  Eustaquio Boyden, MD

## 2023-05-29 NOTE — Assessment & Plan Note (Signed)
Congratulated on weight loss to date. 

## 2023-05-31 ENCOUNTER — Other Ambulatory Visit: Payer: Self-pay | Admitting: Family Medicine

## 2023-05-31 DIAGNOSIS — E875 Hyperkalemia: Secondary | ICD-10-CM

## 2023-05-31 MED ORDER — VITAMIN B-12 1000 MCG PO TABS
1000.0000 ug | ORAL_TABLET | ORAL | Status: DC
Start: 2023-05-31 — End: 2023-11-13

## 2023-05-31 MED ORDER — VITAMIN D3 25 MCG (1000 UT) PO CAPS: 2000.0000 [IU] | ORAL_CAPSULE | Freq: Every evening | ORAL | Status: AC

## 2023-06-05 ENCOUNTER — Ambulatory Visit (HOSPITAL_COMMUNITY)
Admission: RE | Admit: 2023-06-05 | Discharge: 2023-06-05 | Disposition: A | Discharge status: Home / Self Care | Payer: Medicare HMO | Source: Ambulatory Visit | Attending: Family Medicine | Admitting: Family Medicine

## 2023-06-05 ENCOUNTER — Other Ambulatory Visit: Payer: Medicare HMO

## 2023-06-05 DIAGNOSIS — R0989 Other specified symptoms and signs involving the circulatory and respiratory systems: Secondary | ICD-10-CM | POA: Diagnosis not present

## 2023-06-07 ENCOUNTER — Other Ambulatory Visit (INDEPENDENT_AMBULATORY_CARE_PROVIDER_SITE_OTHER): Payer: Medicare HMO

## 2023-06-07 DIAGNOSIS — E875 Hyperkalemia: Secondary | ICD-10-CM | POA: Diagnosis not present

## 2023-06-07 LAB — BASIC METABOLIC PANEL
BUN: 20 mg/dL (ref 6–23)
CO2: 23 mEq/L (ref 19–32)
Calcium: 9.5 mg/dL (ref 8.4–10.5)
Chloride: 105 mEq/L (ref 96–112)
Creatinine, Ser: 1.24 mg/dL (ref 0.40–1.50)
GFR: 55.64 mL/min — ABNORMAL LOW (ref >60.00)
Glucose, Bld: 119 mg/dL — ABNORMAL HIGH (ref 70–99)
Potassium: 3.9 mEq/L (ref 3.5–5.1)
Sodium: 138 mEq/L (ref 135–145)

## 2023-06-07 LAB — VAS US ABI WITH/WO TBI
Left ABI: 1.18
Right ABI: 1.26

## 2023-06-11 ENCOUNTER — Telehealth: Payer: Self-pay

## 2023-06-11 NOTE — Telephone Encounter (Signed)
Attempted to contact pt. No answer. No vm. Need to relay lab results and Dr. Timoteo Expose message. (See Labs, Result Notes- 06/07/23.)  Labs/Dr. Timoteo Expose msg: Your kidneys remain mildly impaired but stable. Potassium levels returned to normal. Continue current medicines.

## 2023-06-12 NOTE — Telephone Encounter (Addendum)
Attempted to contact pt. No answer. No vm. Need to relay lab results and Dr. Timoteo Expose message. (See Labs, Result Notes- 06/07/23.)  Mailing results.  Labs/Dr. Timoteo Expose msg: Your kidneys remain mildly impaired but stable. Potassium levels returned to normal. Continue current medicines.

## 2023-07-10 ENCOUNTER — Ambulatory Visit (INDEPENDENT_AMBULATORY_CARE_PROVIDER_SITE_OTHER): Payer: Medicare HMO | Admitting: Family Medicine

## 2023-07-10 ENCOUNTER — Encounter: Payer: Self-pay | Admitting: Family Medicine

## 2023-07-10 VITALS — BP 156/84 | HR 68 | Temp 97.2°F | Ht 71.0 in | Wt 224.0 lb

## 2023-07-10 DIAGNOSIS — I5032 Chronic diastolic (congestive) heart failure: Secondary | ICD-10-CM | POA: Diagnosis not present

## 2023-07-10 DIAGNOSIS — R400 Somnolence: Secondary | ICD-10-CM | POA: Diagnosis not present

## 2023-07-10 DIAGNOSIS — I4891 Unspecified atrial fibrillation: Secondary | ICD-10-CM | POA: Diagnosis not present

## 2023-07-10 DIAGNOSIS — E1140 Type 2 diabetes mellitus with diabetic neuropathy, unspecified: Secondary | ICD-10-CM | POA: Diagnosis not present

## 2023-07-10 DIAGNOSIS — E538 Deficiency of other specified B group vitamins: Secondary | ICD-10-CM

## 2023-07-10 DIAGNOSIS — I10 Essential (primary) hypertension: Secondary | ICD-10-CM

## 2023-07-10 DIAGNOSIS — R5382 Chronic fatigue, unspecified: Secondary | ICD-10-CM

## 2023-07-10 MED ORDER — CARVEDILOL 6.25 MG PO TABS: 6.2500 mg | ORAL_TABLET | Freq: Two times a day (BID) | ORAL | 6 refills | Status: DC

## 2023-07-10 NOTE — Assessment & Plan Note (Signed)
With daytime somnolence, non restorative sleep, ESS = 5.  Will order home sleep test to further evaluate for sleep apnea.

## 2023-07-10 NOTE — Assessment & Plan Note (Signed)
Sees cardiology yearly.  Continue carvedilol. Consider BNP next labwork.  Consider trial lasix vs SGLT2i.

## 2023-07-10 NOTE — Assessment & Plan Note (Signed)
Latest levels high - plan to drop b12 replacement to once weekly.

## 2023-07-10 NOTE — Progress Notes (Signed)
Ph: [phone] Fax: [phone]   Patient ID: [name], male    DOB: [birth year], 77 y.o.   MRN: [id number]  This visit was conducted in person.  BP (!) 156/84 (BP Location: Right Arm, Cuff Size: Large)   Pulse 68   Temp (!) 97.2 F (36.2 C) (Temporal)   Ht 5\' 11"  (1.803 m)   Wt 224 lb (101.6 kg)   SpO2 98%   BMI 31.24 kg/m   BP Readings from Last 3 Encounters:  07/10/23 (!) 156/84  05/29/23 (!) 160/80  04/20/23 126/68   CC: 6 wk f/u visit  Subjective:   HPI: LATAURUS BRATLAND is a 77 y.o. male presenting on 07/10/2023 for Medical Management of Chronic Issues (Here for 6 wk f/u. Pt accompanied by wife, Steward Drone. )   HTN - Compliant with current antihypertensive regimen of cardizem 240mg  daily, carvedilol 3.125mg  BID. Does not check blood pressures at home: - machine not working well.  No low blood pressure readings or symptoms of dizziness/syncope. Denies HA, vision changes, CP/tightness, SOB, leg swelling.    Lisinopril allergy - angioedema so avoiding ARB.   Last visit we changed metoprolol to carvedilol due to fatigue - some improvement but still notes low energy, easy fatigability.   DM - continues ozempic 0.5mg  weekly and metformin 500mg  BID. cbg this morning 187 after a few portions of banana pudding. Notes ongoing foot paresthesias - tingling, numbness, burning discomfort present throughout the day "walking on bubble wrap". Remote gabapentin trial wasn't helpful.  Lab Results  Component Value Date   HGBA1C 5.9 (A) 05/29/2023   Sleeps well. Occ daytime somnolence. No loud snoring. No witnessed apnea or PNDyspnea. No morning headaches. Endorses non-restorative sleep. Notes more trouble sleeping on back.  Echo 08/2022 with EF 50-55%, mild LVH, mildly enlarged RV size with mildly elevated pulm artery systolic pressures.      Relevant past medical, surgical, family and social history reviewed and updated as indicated. Interim medical history since our last visit  reviewed. Allergies and medications reviewed and updated. Outpatient Medications Prior to Visit  Medication Sig Dispense Refill   apixaban (ELIQUIS) 5 MG TABS tablet Take 1 tablet (5 mg total) by mouth 2 (two) times daily. 180 tablet 3   Ascorbic Acid (VITAMIN C) 1000 MG tablet Take 1,000 mg by mouth every evening.      blood glucose meter kit and supplies KIT Dispense based on patient and insurance preference. Use to check sugars up to twice daily as directed (E11.69). 1 each 0   Calcium Carbonate Antacid (TUMS CHEWY BITES PO) Take 2 tablets by mouth at bedtime as needed (reflux).     Cholecalciferol (VITAMIN D3) 25 MCG (1000 UT) CAPS Take 2 capsules (2,000 Units total) by mouth every evening.     Coenzyme Q10 (CO Q 10 PO) Take 1 capsule by mouth at bedtime.     cyanocobalamin (VITAMIN B12) 1000 MCG tablet Take 1 tablet (1,000 mcg total) by mouth once a week.     diltiazem (CARDIZEM CD) 240 MG 24 hr capsule Take 1 capsule (240 mg total) by mouth daily. 90 capsule 3   glucose blood (GLUCOSE METER TEST) test strip Use as instructed to check sugars twice daily as needed E11.69 100 each 12   metFORMIN (GLUCOPHAGE) 500 MG tablet Take 1 tablet (500 mg total) by mouth 2 (two) times daily with a meal. For diabetes 180 tablet 3   Multiple Vitamins-Minerals (EYE VITAMINS PO) Take 2 tablets by mouth  at bedtime.     Omega-3 Fatty Acids (FISH OIL PO) Take 1 capsule by mouth daily.     omeprazole (PRILOSEC) 40 MG capsule TAKE ONE CAPSULE BY MOUTH ONCE daily AS NEEDED FOR ACID REFLUX 30 capsule 6   rosuvastatin (CRESTOR) 5 MG tablet Take 1 tablet (5 mg total) by mouth daily. 90 tablet 3   Semaglutide,0.25 or 0.5MG /DOS, (OZEMPIC, 0.25 OR 0.5 MG/DOSE,) 2 MG/1.5ML SOPN Inject 0.5 mg into the skin once a week. 4.5 mL 3   carvedilol (COREG) 3.125 MG tablet Take 1 tablet (3.125 mg total) by mouth 2 (two) times daily with a meal. 60 tablet 6   fluticasone (FLONASE) 50 MCG/ACT nasal spray Place 2 sprays into both  nostrils daily. 16 g 0   No facility-administered medications prior to visit.     Per HPI unless specifically indicated in ROS section below Review of Systems  Objective:  BP (!) 156/84 (BP Location: Right Arm, Cuff Size: Large)   Pulse 68   Temp (!) 97.2 F (36.2 C) (Temporal)   Ht 5\' 11"  (1.803 m)   Wt 224 lb (101.6 kg)   SpO2 98%   BMI 31.24 kg/m   Wt Readings from Last 3 Encounters:  07/10/23 224 lb (101.6 kg)  05/29/23 224 lb 4 oz (101.7 kg)  04/20/23 226 lb (102.5 kg)      Physical Exam Vitals and nursing note reviewed.  Constitutional:      Appearance: Normal appearance.  HENT:     Mouth/Throat:     Mouth: Mucous membranes are moist.     Pharynx: Oropharynx is clear. No oropharyngeal exudate or posterior oropharyngeal erythema.  Eyes:     Extraocular Movements: Extraocular movements intact.     Pupils: Pupils are equal, round, and reactive to light.  Neck:     Thyroid: No thyroid mass or thyromegaly.  Cardiovascular:     Rate and Rhythm: Normal rate and regular rhythm.     Pulses: Normal pulses.     Heart sounds: Normal heart sounds. No murmur heard. Pulmonary:     Effort: Pulmonary effort is normal. No respiratory distress.     Breath sounds: No wheezing, rhonchi or rales.     Comments: Bibasilar crackles Musculoskeletal:     Cervical back: Normal range of motion and neck supple.     Right lower leg: No edema.     Left lower leg: No edema.  Skin:    General: Skin is warm and dry.     Findings: No rash.  Neurological:     Mental Status: He is alert.  Psychiatric:        Mood and Affect: Mood normal.        Behavior: Behavior normal.       Results for orders placed or performed in visit on 06/07/23  Basic metabolic panel  Result Value Ref Range   Sodium 138 135 - 145 mEq/L   Potassium 3.9 3.5 - 5.1 mEq/L   Chloride 105 96 - 112 mEq/L   CO2 23 19 - 32 mEq/L   Glucose, Bld 119 (H) 70 - 99 mg/dL   BUN 20 6 - 23 mg/dL   Creatinine, Ser 1.61 0.40  - 1.50 mg/dL   GFR 09.60 (L) >45.40 mL/min   Calcium 9.5 8.4 - 10.5 mg/dL   Lab Results  Component Value Date   VITAMINB12 1,238 (H) 05/29/2023   Lab Results  Component Value Date   TSH 2.24 05/29/2023    Assessment &  Plan:   Problem List Items Addressed This Visit     Essential hypertension - Primary    Chronic, mild improvement but still above goal.  Notes some improvement in fatigue with switch in beta blockers.  Increase carvedilol to 6.25mg  BID.  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      Relevant Medications   carvedilol (COREG) 6.25 MG tablet   Vitamin B12 deficiency    Latest levels high - plan to drop b12 replacement to once weekly.      Type 2 diabetes mellitus with diabetic neuropathy, unspecified (HCC)    B12 levels normal.  Check B1, ANA, ESR next labwork to further evaluate neuropathy.  Previous trial of gabapentin was not helpful.  Trial alpha lipoic acid 600mg  daily.      Atrial fibrillation with RVR (HCC)    Stable period on eliquis and beta blocker.       Relevant Medications   carvedilol (COREG) 6.25 MG tablet   (HFpEF) heart failure with preserved ejection fraction [facility])    Sees cardiology yearly.  Continue carvedilol. Consider BNP next labwork.  Consider trial lasix vs SGLT2i.       Relevant Medications   carvedilol (COREG) 6.25 MG tablet   Chronic fatigue    With daytime somnolence, non restorative sleep, ESS = 5.  Will order home sleep test to further evaluate for sleep apnea.       Relevant Orders   Home sleep test   Other Visit Diagnoses     Daytime somnolence       Relevant Orders   Home sleep test        Meds ordered this encounter  Medications   carvedilol (COREG) 6.25 MG tablet    Sig: Take 1 tablet (6.25 mg total) by mouth 2 (two) times daily with a meal.    Dispense:  60 tablet    Refill:  6    Note new dose    Orders Placed This Encounter   Procedures   Home sleep test    Please order through [facility]    Order Specific Question:   Where should this test be performed:    Answer:   Other    Patient Instructions  Try alpha lipoic acid 600mg  daily over the counter supplement for possible diabetic nerve pain.  For blood pressure, increase carvedilol to 6.25mg  twice daily, let me know if recurrent fatigue. New dose sent to pharmacy.  I'd like to order a home sleep test to evaluate for sleep apnea - we will be in touch to get this scheduled. Return after 10/19/2023 for physical/wellness visit   Follow up plan: Return in about 4 months (around 11/10/2023), or if symptoms worsen or fail to improve, for annual exam, prior fasting for blood work, medicare wellness visit.  Eustaquio Boyden, MD

## 2023-07-10 NOTE — Assessment & Plan Note (Addendum)
B12 levels normal.  Check B1, ANA, ESR next labwork.  Previous trial of gabapentin was not helpful.  Trial alpha lipoic acid 600mg  daily.

## 2023-07-10 NOTE — Patient Instructions (Addendum)
Try alpha lipoic acid 600mg  daily over the counter supplement for possible diabetic nerve pain.  For blood pressure, increase carvedilol to 6.25mg  twice daily, let me know if recurrent fatigue. New dose sent to pharmacy.  I'd like to order a home sleep test to evaluate for sleep apnea - we will be in touch to get this scheduled. Return after 10/19/2023 for physical/wellness visit

## 2023-07-10 NOTE — Assessment & Plan Note (Signed)
Stable period on eliquis and beta blocker.

## 2023-07-10 NOTE — Assessment & Plan Note (Signed)
Chronic, mild improvement but still above goal.  Notes some improvement in fatigue with switch in beta blockers.  Increase carvedilol to 6.25mg  BID.  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

## 2023-07-27 ENCOUNTER — Telehealth: Payer: Self-pay | Admitting: Family Medicine

## 2023-07-27 NOTE — Telephone Encounter (Signed)
Was getting this through [facility] [facility].  Will appreciate pharm assistance.

## 2023-07-27 NOTE — Telephone Encounter (Signed)
Will route to PCP as well as new pharmacist since pt was using [facility]

## 2023-07-27 NOTE — Telephone Encounter (Signed)
Pt is requesting a call back wants to know when he will be getting another order of Ozempic . Please advise [phone]

## 2023-07-30 ENCOUNTER — Telehealth: Payer: Self-pay | Admitting: Family Medicine

## 2023-07-30 NOTE — Telephone Encounter (Signed)
Was helping in inboxes last week, will route to [facility] so they can f/u with pt

## 2023-07-30 NOTE — Telephone Encounter (Signed)
Called to find out if sent. They need your expiration date of your license updated before they can send out. Will need to call back and give them that information.

## 2023-07-30 NOTE — Telephone Encounter (Signed)
Pt called in stated he's low on his Ozempic .wants to know if everything in in order for his refill . Please advise [phone]

## 2023-07-30 NOTE — Telephone Encounter (Signed)
Expiration date is 07/19/2024.

## 2023-07-30 NOTE — Telephone Encounter (Signed)
[facility] - could you notify the patient they can call [employer] to inquire about the status of the shipment - [phone]. They are available Monday though Friday 8:00 AM - 8:00 PM.   Thanks!

## 2023-07-31 NOTE — Telephone Encounter (Signed)
[employer] and spoke with Carleene Overlie to give needed prescribing provider information. She informed me that with the updated information that it will be 24-48 hours before a decision is made.

## 2023-08-02 ENCOUNTER — Ambulatory Visit (INDEPENDENT_AMBULATORY_CARE_PROVIDER_SITE_OTHER): Payer: Medicare HMO

## 2023-08-02 VITALS — Ht 71.0 in | Wt 219.9 lb

## 2023-08-02 DIAGNOSIS — Z0000 Encounter for general adult medical examination without abnormal findings: Secondary | ICD-10-CM

## 2023-08-02 NOTE — Patient Instructions (Signed)
[name] , Thank you for taking time to come for your Medicare Wellness Visit. I appreciate your ongoing commitment to your health goals. Please review the following plan we discussed and let me know if I can assist you in the future.   Referrals/Orders/Follow-Ups/Clinician Recommendations: Aim for 30 minutes of exercise or brisk walking, 6-8 glasses of water, and 5 servings of fruits and vegetables each day.   This is a list of the screening recommended for you and due dates:  Health Maintenance  Topic Date Due   COVID-19 Vaccine (3 - Pfizer risk series) 06/08/2020   Colon Cancer Screening  01/22/2023   Medicare Annual Wellness Visit  08/01/2023   Flu Shot  07/12/2023   Yearly kidney health urinalysis for diabetes  10/19/2023   Eye exam for diabetics  11/01/2023   Hemoglobin A1C  11/28/2023   Complete foot exam   05/28/2024   Yearly kidney function blood test for diabetes  06/06/2024   Pneumonia Vaccine  Completed   Hepatitis C Screening  Completed   Zoster (Shingles) Vaccine  Completed   HPV Vaccine  Aged Out   DTaP/Tdap/Td vaccine  Discontinued    Advanced directives: (Declined) Advance directive discussed with you today. Even though you declined this today, please call our office should you change your mind, and we can give you the proper paperwork for you to fill out.  Next Medicare Annual Wellness Visit scheduled for next year: Yes

## 2023-08-02 NOTE — Progress Notes (Signed)
Subjective:   Devin Huffman is a 77 y.o. male who presents for Medicare Annual/Subsequent preventive examination.  Visit Complete: Virtual  I connected with  Devin Huffman on 08/02/23 by a audio enabled telemedicine application and verified that I am speaking with the correct person using two identifiers.  Patient Location: Home  Provider Location: Office/Clinic  I discussed the limitations of evaluation and management by telemedicine. The patient expressed understanding and agreed to proceed.  Vital Signs: Because this visit was a virtual/telehealth visit, some criteria may be missing or patient reported. Any vitals not documented were not able to be obtained and vitals that have been documented are patient reported.    Review of Systems      Cardiac Risk Factors include: advanced age (>30men, >79 women);diabetes mellitus;dyslipidemia;hypertension;male gender;sedentary lifestyle     Objective:    Today's Vitals   08/02/23 1030  Weight: 219 lb 14.4 oz (99.7 kg)  Height: 5\' 11"  (1.803 m)   Body mass index is 30.67 kg/m.     08/02/2023   10:40 AM 08/13/2022    3:27 PM 08/13/2022   10:05 AM 07/31/2022    1:04 PM 06/02/2022    9:11 AM 07/14/2021    2:51 PM 07/12/2020    9:03 AM  Advanced Directives  Does Patient Have a Medical Advance Directive? No  No No No No No  Would patient like information on creating a medical advance directive? No - Patient declined No - Patient declined  No - Patient declined No - Patient declined No - Patient declined No - Patient declined    Current Medications (verified) Outpatient Encounter Medications as of 08/02/2023  Medication Sig   apixaban (ELIQUIS) 5 MG TABS tablet Take 1 tablet (5 mg total) by mouth 2 (two) times daily.   Ascorbic Acid (VITAMIN C) 1000 MG tablet Take 1,000 mg by mouth every evening.    blood glucose meter kit and supplies KIT Dispense based on patient and insurance preference. Use to check sugars up to twice daily as  directed (E11.69).   Calcium Carbonate Antacid (TUMS CHEWY BITES PO) Take 2 tablets by mouth at bedtime as needed (reflux).   carvedilol (COREG) 6.25 MG tablet Take 1 tablet (6.25 mg total) by mouth 2 (two) times daily with a meal.   Cholecalciferol (VITAMIN D3) 25 MCG (1000 UT) CAPS Take 2 capsules (2,000 Units total) by mouth every evening.   Coenzyme Q10 (CO Q 10 PO) Take 1 capsule by mouth at bedtime.   cyanocobalamin (VITAMIN B12) 1000 MCG tablet Take 1 tablet (1,000 mcg total) by mouth once a week.   diltiazem (CARDIZEM CD) 240 MG 24 hr capsule Take 1 capsule (240 mg total) by mouth daily.   glucose blood (GLUCOSE METER TEST) test strip Use as instructed to check sugars twice daily as needed E11.69   metFORMIN (GLUCOPHAGE) 500 MG tablet Take 1 tablet (500 mg total) by mouth 2 (two) times daily with a meal. For diabetes   Multiple Vitamins-Minerals (EYE VITAMINS PO) Take 2 tablets by mouth at bedtime.   Omega-3 Fatty Acids (FISH OIL PO) Take 1 capsule by mouth daily.   omeprazole (PRILOSEC) 40 MG capsule TAKE ONE CAPSULE BY MOUTH ONCE daily AS NEEDED FOR ACID REFLUX   rosuvastatin (CRESTOR) 5 MG tablet Take 1 tablet (5 mg total) by mouth daily.   Semaglutide,0.25 or 0.5MG /DOS, (OZEMPIC, 0.25 OR 0.5 MG/DOSE,) 2 MG/1.5ML SOPN Inject 0.5 mg into the skin once a week.   [DISCONTINUED] benzonatate (  TESSALON) 100 MG capsule Take 1 capsule (100 mg total) by mouth 3 (three) times daily as needed for cough.   [DISCONTINUED] Cholecalciferol (VITAMIN D3) 25 MCG (1000 UT) CAPS Take 1,000 Units by mouth every evening.   [DISCONTINUED] Cyanocobalamin (VITAMIN B-12 PO) Take 1 capsule by mouth at bedtime.   [DISCONTINUED] fluticasone (FLONASE) 50 MCG/ACT nasal spray Place 2 sprays into both nostrils daily.   [DISCONTINUED] metFORMIN (GLUCOPHAGE) 1000 MG tablet Take 1 tablet (1,000 mg total) by mouth 2 (two) times daily with a meal.   [DISCONTINUED] metoprolol tartrate (LOPRESSOR) 25 MG tablet Take 1 tablet  (25 mg total) by mouth 2 (two) times daily.   No facility-administered encounter medications on file as of 08/02/2023.    Allergies (verified) Lisinopril   History: Past Medical History:  Diagnosis Date   Acute pulmonary edema (HCC) 08/13/2022   Cancer (HCC)    skin cancer removed from nose   COVID-19 virus infection 11/23/2021   Diabetes mellitus without complication (HCC)    GERD (gastroesophageal reflux disease)    occasional   Gout    ?due to L great toe pain, saw Dr Devin Huffman   History of chicken pox    Hyperlipidemia    Hypertension    Legg-Perthes disease    Right hip   Nasal septal deviation    monitoring - Devin Huffman   Pneumonia due to COVID-19 virus 01/21/2020   Sepsis (HCC)    In March 2018   Past Surgical History:  Procedure Laterality Date   COLONOSCOPY  03/2011   2 tubular adenomas, rec rpt 5 yrs   COLONOSCOPY  01/2018   diverticulosis, rpt 5 yrs Devin Huffman)   HERNIA REPAIR     double hernia   LUMBAR FUSION  04/2013   transforaminal interbody fusion L4/5 (Devin Huffman) for R L5 radiculopathy, 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   Hypertension Maternal Uncle    Stroke Maternal Uncle    Coronary artery disease Paternal Uncle        MI   Cancer Other        niece-2 types   Social History   Socioeconomic History   Marital status: Married    Spouse name: Not on file   Number of children: Not on file   Years of education: Not  on file   Highest education level: Not on file  Occupational History   Occupation: part-time  Tobacco Use   Smoking status: Never   Smokeless tobacco: Never  Vaping Use   Vaping status: Never Used  Substance and Sexual Activity   Alcohol use: No   Drug use: No   Sexual activity: Yes  Other Topics Concern   Not on file  Social History Narrative   Caffeine: few cups/day   Lives with wife, no pets   Occupation-Structural Steel  Estimator, "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  No regular exercise.   Diet: "I love my ice cream. I can't live forever", some water   Social Determinants of Health   Financial Resource Strain: Low Risk  (08/02/2023)   Overall Financial Resource Strain (CARDIA)    Difficulty of Paying Living Expenses: Not hard at all  Food Insecurity: No Food Insecurity (08/02/2023)   Hunger Vital Sign    Worried About Running Out of Food in the Last Year: Never true    Ran Out of Food in the Last Year: Never true  Transportation Needs: No Transportation Needs (08/02/2023)   PRAPARE - Administrator, Civil Service (Medical): No    Lack of Transportation (Non-Medical): No  Physical Activity: Inactive (08/02/2023)   Exercise Vital Sign    Days of Exercise per Week: 0 days    Minutes of Exercise per Session: 0 min  Stress: No Stress Concern Present (08/02/2023)   [employer] of Occupational Health - Occupational Stress Questionnaire    Feeling of Stress : Not at all  Social Connections: Socially Integrated (08/02/2023)   Social Connection and Isolation Panel [NHANES]    Frequency of Communication with Friends and Family: More than three times a week    Frequency of Social Gatherings with Friends and Family: More than three times a week    Attends Religious Services: More than 4 times per year    Active Member of [employer] or Organizations: Yes    Attends Engineer, structural: More than 4 times per  year    Marital Status: Married    Tobacco Counseling Counseling given: Not Answered   Clinical Intake:  Pre-visit preparation completed: Yes  Pain : No/denies pain     BMI - recorded: 30.67 Nutritional Status: BMI > 30  Obese Nutritional Risks: None Diabetes: Yes CBG done?: Yes (170 per pt) CBG resulted in Enter/ Edit results?: No Did pt. bring in CBG monitor from home?: No  How often do you need to have someone help you when you read instructions, pamphlets, or other written materials from your doctor or pharmacy?: 1 - Never  Interpreter Needed?: No  Information entered by :: C.Carnelia Oscar LPN   Activities of Daily Living    08/02/2023   10:41 AM 08/13/2022    3:27 PM  In your present state of health, do you have any difficulty performing the following activities:  Hearing? 0 0  Vision? 1 0  Comment Cataracts   Difficulty concentrating or making decisions? 0 0  Walking or climbing stairs? 0 0  Dressing or bathing? 0 0  Doing errands, shopping? 0 1  Preparing Food and eating ? N   Using the Toilet? N   In the past six months, have you accidently leaked urine? Y   Comment occasionally   Do you have problems with loss of bowel control? N   Managing your Medications? N   Managing your Finances? N   Housekeeping or managing your Housekeeping? N     Patient Care Team: Eustaquio Boyden, MD as PCP - General (Family Medicine) Rollene Rotunda, MD as PCP - Cardiology (Cardiology) Kathyrn Sheriff, [facility] (Inactive) as Pharmacist (Pharmacist)  Indicate any recent Medical Services you may have received from other than [facility] providers in the past year (date may be approximate).     Assessment:   This is a routine wellness examination for Jovani.  Hearing/Vision screen Hearing Screening -  Comments:: Denies hearing difficulties   Vision Screening - Comments:: Readers - Has Cataracts - UTD on eye exams with Dr.Hecker  Dietary issues and exercise activities discussed:      Goals Addressed             This Visit's Progress    Patient Stated       Increase exercise.       Depression Screen    08/02/2023   10:33 AM 07/10/2023    1:51 PM 04/20/2023   10:59 AM 01/11/2023    2:44 PM 07/31/2022    1:01 PM 06/02/2022    9:11 AM 07/14/2021    2:53 PM  PHQ 2/9 Scores  PHQ - 2 Score 0 0 0 0 0 0 0  PHQ- 9 Score   2 0 0  0    Fall Risk    08/02/2023   10:36 AM 04/20/2023   10:59 AM 01/11/2023    2:44 PM 08/17/2022   10:10 AM 07/31/2022    1:05 PM  Fall Risk   Falls in the past year? 0 0 0 0 0  Number falls in past yr: 0 0 0 0 0  Injury with Fall? 0 0 0 0 0  Risk for fall due to : No Fall Risks No Fall Risks No Fall Risks No Fall Risks No Fall Risks  Follow up Falls prevention discussed;Falls evaluation completed Falls evaluation completed Falls evaluation completed Falls evaluation completed Falls evaluation completed    MEDICARE RISK AT HOME: Medicare Risk at Home Any stairs in or around the home?: Yes If so, are there any without handrails?: No Home free of loose throw rugs in walkways, pet beds, electrical cords, etc?: Yes Adequate lighting in your home to reduce risk of falls?: Yes Life alert?: No Use of a cane, walker or w/c?: No Grab bars in the bathroom?: No Shower chair or bench in shower?: No Elevated toilet seat or a handicapped toilet?: Yes  TIMED UP AND GO:  Was the test performed?  No    Cognitive Function:    07/14/2021    2:56 PM 07/12/2020    9:08 AM 05/27/2018    8:12 AM 05/22/2017    1:56 PM  MMSE - Mini Mental State Exam  Not completed: Refused     Orientation to time  5 5 5   Orientation to Place  5 5 5   Registration  3 3 3   Attention/ Calculation  5 0 0  Recall  3 3 3   Language- name 2 objects   0 0  Language- repeat  1 1 1   Language- follow 3 step command   3 3  Language- read & follow direction   0 0  Write a sentence   0 0  Copy design   0 0  Total score   20 20        08/02/2023   10:43 AM 07/31/2022    1:06  PM  6CIT Screen  What Year? 0 points 0 points  What month? 0 points 0 points  What time? 0 points 0 points  Count back from 20 0 points 0 points  Months in reverse 4 points 0 points  Repeat phrase 6 points 0 points  Total Score 10 points 0 points    Immunizations Immunization History  Administered Date(s) Administered   Fluad Quad(high Dose 65+) 01/12/2021, 10/09/2022   Influenza Split 10/15/2012   Influenza,inj,Quad PF,6+ Mos 11/11/2014, 11/17/2015, 08/31/2016, 11/28/2017, 10/21/2018   PFIZER(Purple Top)SARS-COV-2 Vaccination  04/19/2020, 05/11/2020   Pneumococcal Conjugate-13 11/11/2014   Pneumococcal Polysaccharide-23 10/15/2012   Td 12/12/1996, 03/22/2009, 07/12/2020   Zoster Recombinant(Shingrix) 03/14/2022, 10/09/2022   Zoster, Live 10/13/2013    TDAP status: Up to date  Flu Vaccine status: Due, Education has been provided regarding the importance of this vaccine. Advised may receive this vaccine at local pharmacy or Health Dept. Aware to provide a copy of the vaccination record if obtained from local pharmacy or Health Dept. Verbalized acceptance and understanding.  Pneumococcal vaccine status: Up to date  Covid-19 vaccine status: Declined, Education has been provided regarding the importance of this vaccine but patient still declined. Advised may receive this vaccine at local pharmacy or Health Dept.or vaccine clinic. Aware to provide a copy of the vaccination record if obtained from local pharmacy or Health Dept. Verbalized acceptance and understanding.  Qualifies for Shingles Vaccine? Yes   Zostavax completed Yes   Shingrix Completed?: Yes  Screening Tests Health Maintenance  Topic Date Due   COVID-19 Vaccine (3 - Pfizer risk series) 06/08/2020   Colonoscopy  01/22/2023   INFLUENZA VACCINE  07/12/2023   Diabetic kidney evaluation - Urine ACR  10/19/2023   OPHTHALMOLOGY EXAM  11/01/2023   HEMOGLOBIN A1C  11/28/2023   FOOT EXAM  05/28/2024   Diabetic kidney  evaluation - eGFR measurement  06/06/2024   Medicare Annual Wellness (AWV)  08/01/2024   Pneumonia Vaccine 61+ Years old  Completed   Hepatitis C Screening  Completed   Zoster Vaccines- Shingrix  Completed   HPV VACCINES  Aged Out   DTaP/Tdap/Td  Discontinued    Health Maintenance  Health Maintenance Due  Topic Date Due   COVID-19 Vaccine (3 - Pfizer risk series) 06/08/2020   Colonoscopy  01/22/2023   INFLUENZA VACCINE  07/12/2023    Colorectal cancer screening: No longer required.   Lung Cancer Screening: (Low Dose CT Chest recommended if Age 79-80 years, 20 pack-year currently smoking OR have quit w/in 15years.) does not qualify.   Lung Cancer Screening Referral:    Additional Screening:  Hepatitis C Screening: does qualify; Completed 08/30/16  Vision Screening: Recommended annual ophthalmology exams for early detection of glaucoma and other disorders of the eye. Is the patient up to date with their annual eye exam?  Yes  Who is the provider or what is the name of the office in which the patient attends annual eye exams? Dr.Hecker If pt is not established with a provider, would they like to be referred to a provider to establish care? Yes .   Dental Screening: Recommended annual dental exams for proper oral hygiene  Diabetic Foot Exam: Diabetic Foot Exam: Completed 05/29/23  Community Resource Referral / Chronic Care Management: CRR required this visit?  No   CCM required this visit?  No     Plan:     I have personally reviewed and noted the following in the patient's chart:   Medical and social history Use of alcohol, tobacco or illicit drugs  Current medications and supplements including opioid prescriptions. Patient is not currently taking opioid prescriptions. Functional ability and status Nutritional status Physical activity Advanced directives List of other physicians Hospitalizations, surgeries, and ER visits in previous 12 months Vitals Screenings  to include cognitive, depression, and falls Referrals and appointments  In addition, I have reviewed and discussed with patient certain preventive protocols, quality metrics, and best practice recommendations. A written personalized care plan for preventive services as well as general preventive health recommendations were provided to patient.  Maryan Puls, LPN   02/15/6577   After Visit Summary: (Declined) Due to this being a telephonic visit, with patients personalized plan was offered to patient but patient Declined AVS at this time   Nurse Notes: none

## 2023-08-03 ENCOUNTER — Other Ambulatory Visit: Payer: Self-pay | Admitting: Family Medicine

## 2023-08-07 ENCOUNTER — Telehealth: Payer: Self-pay

## 2023-08-07 DIAGNOSIS — R5382 Chronic fatigue, unspecified: Secondary | ICD-10-CM

## 2023-08-07 NOTE — Telephone Encounter (Signed)
Received faxed notification from [employer] letting us know that they have not been able to get in touch with pt to set up home sleep study.  Called pt and he is not interested in having a sleep study at this time.

## 2023-08-07 NOTE — Telephone Encounter (Signed)
Noted  

## 2023-08-07 NOTE — Telephone Encounter (Addendum)
[employer], they were still needing Dr. Timoteo Expose license expiration date. This information has been given to them again. Pt's order has been released as of today and should arrive at the office in 10-14 business days. The shipment will be for a 120 day supply.  Spoke with pt, he is aware that the office will call him once his medication is received.

## 2023-08-21 ENCOUNTER — Telehealth: Payer: Self-pay | Admitting: Family Medicine

## 2023-08-21 NOTE — Telephone Encounter (Signed)
Hi - looks like Jaynee Eagles called 8/27 and the order was released, would arrive in 10-14 business days. Looks like tomorrow will be business day 10.   The patient's wife can call [employer] at [phone] (Monday though Friday 8:00 AM - 8:00 PM) to confirm when order ships/will be received, but I anticipate it will be later this week.   Thanks!

## 2023-08-21 NOTE — Telephone Encounter (Signed)
Called patient wife ( on dpr( back. Wanted to see if we have received patient Ozempic. He has been getting from patient assistance in the past. We do not have in office. Patient will be out soon. With the changes in pharmacy not sure if any further steps needed to be done to get sent out. We did have 2 samples in office have put patient name on and wife will come by  today to pick up.

## 2023-08-21 NOTE — Telephone Encounter (Signed)
Pt's wife, Steward Drone, called asking if the pt's ozempic was at our office ready for pickup? Steward Drone stated the pt is now out of meds. Call back # [phone]

## 2023-08-21 NOTE — Telephone Encounter (Signed)
Spoke with pt relaying Catherine's message. Pt verbalizes understanding.

## 2023-08-22 NOTE — Telephone Encounter (Signed)
Noted  

## 2023-08-22 NOTE — Telephone Encounter (Signed)
Patient came by to pick up ozempic

## 2023-08-23 ENCOUNTER — Telehealth: Payer: Self-pay

## 2023-08-23 NOTE — Telephone Encounter (Signed)
Received pt's med shipment of Ozempic 0.25 mg, 0.5 mg (5 boxes total).    Spoke with pt notifying him of above shipment.    [Placed Ozempic (5 boxes total) in 2nd refrigerator, top shelf]

## 2023-08-30 ENCOUNTER — Encounter: Payer: Medicare HMO | Admitting: Pharmacist

## 2023-09-07 ENCOUNTER — Other Ambulatory Visit: Payer: Self-pay | Admitting: Family Medicine

## 2023-10-01 ENCOUNTER — Telehealth: Payer: Self-pay | Admitting: Family Medicine

## 2023-10-01 NOTE — Telephone Encounter (Signed)
Records have been updated

## 2023-10-01 NOTE — Telephone Encounter (Signed)
Pt's wife dropped of records of flu shot given at [facility], asked for these to be updated in records. Left in provider's tray

## 2023-11-02 ENCOUNTER — Other Ambulatory Visit: Payer: Self-pay | Admitting: Family Medicine

## 2023-11-02 DIAGNOSIS — K219 Gastro-esophageal reflux disease without esophagitis: Secondary | ICD-10-CM

## 2023-11-04 ENCOUNTER — Other Ambulatory Visit: Payer: Self-pay | Admitting: Family Medicine

## 2023-11-04 DIAGNOSIS — E1169 Type 2 diabetes mellitus with other specified complication: Secondary | ICD-10-CM

## 2023-11-04 DIAGNOSIS — M1A0710 Idiopathic chronic gout, right ankle and foot, without tophus (tophi): Secondary | ICD-10-CM

## 2023-11-04 DIAGNOSIS — E538 Deficiency of other specified B group vitamins: Secondary | ICD-10-CM

## 2023-11-04 DIAGNOSIS — I4891 Unspecified atrial fibrillation: Secondary | ICD-10-CM

## 2023-11-06 ENCOUNTER — Other Ambulatory Visit (INDEPENDENT_AMBULATORY_CARE_PROVIDER_SITE_OTHER): Payer: Medicare HMO

## 2023-11-06 DIAGNOSIS — E785 Hyperlipidemia, unspecified: Secondary | ICD-10-CM

## 2023-11-06 DIAGNOSIS — M1A0710 Idiopathic chronic gout, right ankle and foot, without tophus (tophi): Secondary | ICD-10-CM

## 2023-11-06 DIAGNOSIS — E1169 Type 2 diabetes mellitus with other specified complication: Secondary | ICD-10-CM

## 2023-11-06 DIAGNOSIS — I4891 Unspecified atrial fibrillation: Secondary | ICD-10-CM | POA: Diagnosis not present

## 2023-11-06 DIAGNOSIS — E538 Deficiency of other specified B group vitamins: Secondary | ICD-10-CM | POA: Diagnosis not present

## 2023-11-06 LAB — URIC ACID: Uric Acid, Serum: 8 mg/dL — ABNORMAL HIGH (ref 4.0–7.8)

## 2023-11-06 LAB — CBC WITH DIFFERENTIAL/PLATELET
Basophils Absolute: 0 10*3/uL (ref 0.0–0.1)
Basophils Relative: 0.5 % (ref 0.0–3.0)
Eosinophils Absolute: 0.3 10*3/uL (ref 0.0–0.7)
Eosinophils Relative: 3.3 % (ref 0.0–5.0)
HCT: 42 % (ref 39.0–52.0)
Hemoglobin: 13.9 g/dL (ref 13.0–17.0)
Lymphocytes Relative: 21.7 % (ref 12.0–46.0)
Lymphs Abs: 2.1 10*3/uL (ref 0.7–4.0)
MCHC: 33 g/dL (ref 30.0–36.0)
MCV: 99.9 fL (ref 78.0–100.0)
Monocytes Absolute: 0.8 10*3/uL (ref 0.1–1.0)
Monocytes Relative: 7.7 % (ref 3.0–12.0)
Neutro Abs: 6.5 10*3/uL (ref 1.4–7.7)
Neutrophils Relative %: 66.8 % (ref 43.0–77.0)
Platelets: 272 10*3/uL (ref 150.0–400.0)
RBC: 4.21 Mil/uL — ABNORMAL LOW (ref 4.22–5.81)
RDW: 12.2 % (ref 11.5–15.5)
WBC: 9.8 10*3/uL (ref 4.0–10.5)

## 2023-11-06 LAB — MICROALBUMIN / CREATININE URINE RATIO
Creatinine,U: 99.2 mg/dL
Microalb Creat Ratio: 1.1 mg/g (ref 0.0–30.0)
Microalb, Ur: 1 mg/dL (ref 0.0–1.9)

## 2023-11-06 LAB — COMPREHENSIVE METABOLIC PANEL
ALT: 15 U/L (ref 0–53)
AST: 15 U/L (ref 0–37)
Albumin: 4.3 g/dL (ref 3.5–5.2)
Alkaline Phosphatase: 72 U/L (ref 39–117)
BUN: 24 mg/dL — ABNORMAL HIGH (ref 6–23)
CO2: 27 meq/L (ref 19–32)
Calcium: 9.5 mg/dL (ref 8.4–10.5)
Chloride: 104 meq/L (ref 96–112)
Creatinine, Ser: 1.29 mg/dL (ref 0.40–1.50)
GFR: 52.91 mL/min — ABNORMAL LOW (ref >60.00)
Glucose, Bld: 146 mg/dL — ABNORMAL HIGH (ref 70–99)
Potassium: 5.2 meq/L — ABNORMAL HIGH (ref 3.5–5.1)
Sodium: 140 meq/L (ref 135–145)
Total Bilirubin: 0.6 mg/dL (ref 0.2–1.2)
Total Protein: 7 g/dL (ref 6.0–8.3)

## 2023-11-06 LAB — LIPID PANEL
Cholesterol: 135 mg/dL (ref 0–200)
HDL: 34.6 mg/dL — ABNORMAL LOW (ref >39.00)
LDL Cholesterol: 53 mg/dL (ref 0–99)
NonHDL: 100.49
Total CHOL/HDL Ratio: 4
Triglycerides: 238 mg/dL — ABNORMAL HIGH (ref 0.0–149.0)
VLDL: 47.6 mg/dL — ABNORMAL HIGH (ref 0.0–40.0)

## 2023-11-06 LAB — VITAMIN B12: Vitamin B-12: 357 pg/mL (ref 211–911)

## 2023-11-06 LAB — HEMOGLOBIN A1C: Hgb A1c MFr Bld: 6.8 % — ABNORMAL HIGH (ref 4.6–6.5)

## 2023-11-07 ENCOUNTER — Other Ambulatory Visit: Payer: Self-pay | Admitting: Family Medicine

## 2023-11-07 ENCOUNTER — Encounter: Payer: Self-pay | Admitting: Family Medicine

## 2023-11-07 DIAGNOSIS — R7309 Other abnormal glucose: Secondary | ICD-10-CM | POA: Diagnosis not present

## 2023-11-07 DIAGNOSIS — H524 Presbyopia: Secondary | ICD-10-CM | POA: Diagnosis not present

## 2023-11-07 DIAGNOSIS — H35033 Hypertensive retinopathy, bilateral: Secondary | ICD-10-CM | POA: Diagnosis not present

## 2023-11-07 DIAGNOSIS — H43813 Vitreous degeneration, bilateral: Secondary | ICD-10-CM | POA: Diagnosis not present

## 2023-11-07 DIAGNOSIS — H25813 Combined forms of age-related cataract, bilateral: Secondary | ICD-10-CM | POA: Diagnosis not present

## 2023-11-07 LAB — HM DIABETES EYE EXAM

## 2023-11-13 ENCOUNTER — Encounter: Payer: Self-pay | Admitting: Family Medicine

## 2023-11-13 ENCOUNTER — Ambulatory Visit: Payer: Medicare HMO | Admitting: Family Medicine

## 2023-11-13 VITALS — BP 172/80 | HR 72 | Temp 98.2°F | Ht 69.5 in | Wt 223.0 lb

## 2023-11-13 DIAGNOSIS — I5032 Chronic diastolic (congestive) heart failure: Secondary | ICD-10-CM | POA: Diagnosis not present

## 2023-11-13 DIAGNOSIS — Z7985 Long-term (current) use of injectable non-insulin antidiabetic drugs: Secondary | ICD-10-CM | POA: Diagnosis not present

## 2023-11-13 DIAGNOSIS — Z0001 Encounter for general adult medical examination with abnormal findings: Secondary | ICD-10-CM

## 2023-11-13 DIAGNOSIS — Z1211 Encounter for screening for malignant neoplasm of colon: Secondary | ICD-10-CM

## 2023-11-13 DIAGNOSIS — E538 Deficiency of other specified B group vitamins: Secondary | ICD-10-CM

## 2023-11-13 DIAGNOSIS — E66811 Obesity, class 1: Secondary | ICD-10-CM

## 2023-11-13 DIAGNOSIS — M1A0710 Idiopathic chronic gout, right ankle and foot, without tophus (tophi): Secondary | ICD-10-CM | POA: Diagnosis not present

## 2023-11-13 DIAGNOSIS — I10 Essential (primary) hypertension: Secondary | ICD-10-CM | POA: Diagnosis not present

## 2023-11-13 DIAGNOSIS — E1169 Type 2 diabetes mellitus with other specified complication: Secondary | ICD-10-CM | POA: Diagnosis not present

## 2023-11-13 DIAGNOSIS — K219 Gastro-esophageal reflux disease without esophagitis: Secondary | ICD-10-CM | POA: Diagnosis not present

## 2023-11-13 DIAGNOSIS — Z7984 Long term (current) use of oral hypoglycemic drugs: Secondary | ICD-10-CM

## 2023-11-13 DIAGNOSIS — R5382 Chronic fatigue, unspecified: Secondary | ICD-10-CM

## 2023-11-13 DIAGNOSIS — E1140 Type 2 diabetes mellitus with diabetic neuropathy, unspecified: Secondary | ICD-10-CM | POA: Diagnosis not present

## 2023-11-13 DIAGNOSIS — I4891 Unspecified atrial fibrillation: Secondary | ICD-10-CM

## 2023-11-13 DIAGNOSIS — E785 Hyperlipidemia, unspecified: Secondary | ICD-10-CM

## 2023-11-13 DIAGNOSIS — E1136 Type 2 diabetes mellitus with diabetic cataract: Secondary | ICD-10-CM

## 2023-11-13 DIAGNOSIS — I77810 Thoracic aortic ectasia: Secondary | ICD-10-CM

## 2023-11-13 DIAGNOSIS — Z0000 Encounter for general adult medical examination without abnormal findings: Secondary | ICD-10-CM

## 2023-11-13 DIAGNOSIS — Z7189 Other specified counseling: Secondary | ICD-10-CM

## 2023-11-13 MED ORDER — COENZYME Q10 100 MG PO CAPS: 100.0000 mg | ORAL_CAPSULE | Freq: Every day | ORAL | Status: AC

## 2023-11-13 MED ORDER — FUROSEMIDE 20 MG PO TABS: 20.0000 mg | ORAL_TABLET | ORAL | 3 refills | Status: DC

## 2023-11-13 MED ORDER — ROSUVASTATIN CALCIUM 5 MG PO TABS: 5.0000 mg | ORAL_TABLET | Freq: Every day | ORAL | 4 refills | Status: DC

## 2023-11-13 MED ORDER — VITAMIN B-12 1000 MCG PO TABS: 1000.0000 ug | ORAL_TABLET | ORAL | Status: AC

## 2023-11-13 MED ORDER — OMEPRAZOLE 40 MG PO CPDR: 40.0000 mg | DELAYED_RELEASE_CAPSULE | Freq: Every day | ORAL | 4 refills | Status: DC | PRN

## 2023-11-13 MED ORDER — METFORMIN HCL 500 MG PO TABS: 500.0000 mg | ORAL_TABLET | Freq: Two times a day (BID) | ORAL | 4 refills | Status: DC

## 2023-11-13 MED ORDER — DILTIAZEM HCL ER COATED BEADS 240 MG PO CP24: 240.0000 mg | ORAL_CAPSULE | Freq: Every day | ORAL | 4 refills | Status: DC

## 2023-11-13 MED ORDER — CARVEDILOL 6.25 MG PO TABS: 6.2500 mg | ORAL_TABLET | Freq: Two times a day (BID) | ORAL | 4 refills | Status: DC

## 2023-11-13 MED ORDER — APIXABAN 5 MG PO TABS: 5.0000 mg | ORAL_TABLET | Freq: Two times a day (BID) | ORAL | 12 refills | Status: DC

## 2023-11-13 NOTE — Progress Notes (Signed)
Ph: [phone] Fax: [phone]   Patient ID: Devin Huffman, male    DOB: October 30, 1946, 77 y.o.   MRN: [id number]  This visit was conducted in person.  BP (!) 172/80 (BP Location: Right Arm, Cuff Size: Large)   Pulse 72   Temp 98.2 F (36.8 C) (Oral)   Ht 5' 9.5" (1.765 m)   Wt 223 lb (101.2 kg)   SpO2 95%   BMI 32.46 kg/m   BP Readings from Last 3 Encounters:  11/13/23 (!) 172/80  07/10/23 (!) 156/84  05/29/23 (!) 160/80    CC: CPE Subjective:   HPI: Devin Huffman is a 77 y.o. male presenting on 11/13/2023 for Annual Exam (MCR prt 2 [AWV- 08/02/23]. Reports having elevated BP with home monitor- 160/100 right out of bed. Also, reports elevated BS readings- 180s, 150s. Pt accompanied by wife, Devin Huffman. )   Saw health advisor 07/2023 for medicare wellness visit. Note reviewed.  Failed cognitive assessment:    08/02/2023   10:43 AM 07/31/2022    1:06 PM  6CIT Screen  What Year? 0 points 0 points  What month? 0 points 0 points  What time? 0 points 0 points  Count back from 20 0 points 0 points  Months in reverse 4 points 0 points  Repeat phrase 6 points 0 points  Total Score 10 points 0 points  He and wife difficulty with memory.   No results found.  Flowsheet Row Clinical Support from 08/02/2023 in [facility]  PHQ-2 Total Score 0          08/02/2023   10:36 AM 04/20/2023   10:59 AM 01/11/2023    2:44 PM 08/17/2022   10:10 AM 07/31/2022    1:05 PM  Fall Risk   Falls in the past year? 0 0 0 0 0  Number falls in past yr: 0 0 0 0 0  Injury with Fall? 0 0 0 0 0  Risk for fall due to : No Fall Risks No Fall Risks No Fall Risks No Fall Risks No Fall Risks  Follow up Falls prevention discussed;Falls evaluation completed Falls evaluation completed Falls evaluation completed Falls evaluation completed Falls evaluation completed   Afib and dCHF diagnosis s/p hospitalization 08/2022, now on eliquis and diltiazem.  HTN - h/o angioedema to  lisinopril. Regularly takes carvedilol and diltiazem. No HA, vision changes, CP/tightness, SOB, leg swelling.   HST ordered 06/2023 - he declined completing. Denies significant trouble with daytime somnolence, no significant snoring, no witnessed apnea.    Preventative: COLONOSCOPY 01/2018 diverticulosis, rpt 5 yrs Russella Dar)  Prostate cancer screening - normal in the past. We have stopped screening. No fmhx prostate cancer.  Lung cancer screening - not eligible  Flu shot yearly COVID vaccine - Pfizer 04/2020, 05/2020, no booster Tetanus 2010, Td 07/2020 Pneumovax 2013, prevnar-13 2015  zostavax 10/13/2013   Shingrix - completed at pharmacy (09/2022) Advanced directives: does not want prolonged life support. Ok for reversible cause. Wife would be HCPOA. Advanced directive packet previously provided.  Seat belt use discussed Sunscreen use discussed. No changing moles on skin. 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 No regular exercise.  Diet: some water, enjoys sweets      Relevant past medical, surgical, family and social history reviewed and updated as indicated. Interim medical history since our last visit reviewed. Allergies and medications reviewed and updated. Outpatient Medications Prior to Visit  Medication Sig Dispense Refill   Ascorbic Acid (VITAMIN C) 1000 MG tablet Take 1,000 mg by mouth every evening.      blood glucose meter kit and supplies KIT Dispense based on patient and insurance preference. Use to check sugars up to twice daily as directed (E11.69). 1 each 0   Calcium Carbonate Antacid (TUMS CHEWY BITES PO) Take 2 tablets by mouth at bedtime as needed (reflux).      Cholecalciferol (VITAMIN D3) 25 MCG (1000 UT) CAPS Take 2 capsules (2,000 Units total) by mouth every evening.     glucose blood (GLUCOSE METER TEST) test strip Use as instructed to check sugars twice daily as needed E11.69 100 each 12   Multiple Vitamins-Minerals (EYE VITAMINS PO) Take 2 tablets by mouth at bedtime.     Omega-3 Fatty Acids (FISH OIL PO) Take 1 capsule by mouth daily.     carvedilol (COREG) 6.25 MG tablet Take 1 tablet (6.25 mg total) by mouth 2 (two) times daily with a meal. 60 tablet 6   Coenzyme Q10 (CO Q 10 PO) Take 1 capsule by mouth at bedtime.     cyanocobalamin (VITAMIN B12) 1000 MCG tablet Take 1 tablet (1,000 mcg total) by mouth once a week.     diltiazem (CARDIZEM CD) 240 MG 24 hr capsule TAKE 1 CAPSULE BY MOUTH DAILY 30 capsule 10   ELIQUIS 5 MG TABS tablet Take 1 tablet by mouth twice daily 60 tablet 0   metFORMIN (GLUCOPHAGE) 500 MG tablet Take 1 tablet (500 mg total) by mouth 2 (two) times daily with a meal. For diabetes 180 tablet 3   omeprazole (PRILOSEC) 40 MG capsule TAKE 1 CAPSULE BY MOUTH ONCE DAILY AS NEEDED FOR ACID REFLUX 30 capsule 0   OZEMPIC, 0.25 OR 0.5 MG/DOSE, 2 MG/3ML SOPN INJECT 0.5MG  SUBCUTANEOUSLY ONCE WEEKLY *REFILL REQUEST* 3 mL 10   rosuvastatin (CRESTOR) 5 MG tablet TAKE 1 TABLET BY MOUTH DAILY 30 tablet 10   Semaglutide,0.25 or 0.5MG /DOS, (OZEMPIC, 0.25 OR 0.5 MG/DOSE,) 2 MG/1.5ML SOPN Inject 0.5 mg into the skin once a week. 4.5 mL 3   Semaglutide,0.25 or 0.5MG /DOS, (OZEMPIC, 0.25 OR 0.5 MG/DOSE,) 2 MG/3ML SOPN Inject 0.5 mg as directed once a week.     No facility-administered medications prior to visit.     Per HPI unless specifically indicated in ROS section below Review of Systems  Constitutional:  Negative for activity change, appetite change, chills, fatigue, fever and unexpected weight change.  HENT:  Negative for hearing loss.   Eyes:  Negative for visual disturbance.  Respiratory:  Negative for cough, chest tightness, shortness  of breath and wheezing.   Cardiovascular:  Negative for chest pain, palpitations and leg swelling.  Gastrointestinal:  Negative for abdominal distention, abdominal pain, blood in stool, constipation, diarrhea, nausea and vomiting.  Genitourinary:  Negative for difficulty urinating and hematuria.  Musculoskeletal:  Negative for arthralgias, myalgias and neck pain.  Skin:  Negative for rash.  Neurological:  Negative for dizziness, seizures, syncope and headaches.  Hematological:  Negative for adenopathy. Does not bruise/bleed easily.  Psychiatric/Behavioral:  Negative for dysphoric mood. The patient is not nervous/anxious.     Objective:  BP (!) 172/80 (BP Location: Right Arm, Cuff Size: Large)  Pulse 72   Temp 98.2 F (36.8 C) (Oral)   Ht 5' 9.5" (1.765 m)   Wt 223 lb (101.2 kg)   SpO2 95%   BMI 32.46 kg/m   Wt Readings from Last 3 Encounters:  11/13/23 223 lb (101.2 kg)  08/02/23 219 lb 14.4 oz (99.7 kg)  07/10/23 224 lb (101.6 kg)      Physical Exam Vitals and nursing note reviewed.  Constitutional:      General: He is not in acute distress.    Appearance: Normal appearance. He is well-developed. He is not ill-appearing.  HENT:     Head: Normocephalic and atraumatic.     Right Ear: Hearing, tympanic membrane, ear canal and external ear normal.     Left Ear: Hearing, tympanic membrane, ear canal and external ear normal.     Mouth/Throat:     Mouth: Mucous membranes are moist.     Pharynx: Oropharynx is clear. No oropharyngeal exudate or posterior oropharyngeal erythema.  Eyes:     General: No scleral icterus.    Extraocular Movements: Extraocular movements intact.     Conjunctiva/sclera: Conjunctivae normal.     Pupils: Pupils are equal, round, and reactive to light.  Neck:     Thyroid: No thyroid mass or thyromegaly.     Vascular: No carotid bruit.  Cardiovascular:     Rate and Rhythm: Normal rate and regular rhythm.     Pulses: Normal pulses.          Radial  pulses are 2+ on the right side and 2+ on the left side.     Heart sounds: Normal heart sounds. No murmur heard. Pulmonary:     Effort: Pulmonary effort is normal. No respiratory distress.     Breath sounds: Normal breath sounds. No wheezing, rhonchi or rales.  Abdominal:     General: Bowel sounds are normal. There is no distension.     Palpations: Abdomen is soft. There is no mass.     Tenderness: There is no abdominal tenderness. There is no guarding or rebound.     Hernia: No hernia is present.  Musculoskeletal:        General: Normal range of motion.     Cervical back: Normal range of motion and neck supple.     Right lower leg: No edema.     Left lower leg: No edema.  Lymphadenopathy:     Cervical: No cervical adenopathy.  Skin:    General: Skin is warm and dry.     Findings: No rash.  Neurological:     General: No focal deficit present.     Mental Status: He is alert and oriented to person, place, and time.  Psychiatric:        Mood and Affect: Mood normal.        Behavior: Behavior normal.        Thought Content: Thought content normal.        Judgment: Judgment normal.       Results for orders placed or performed in visit on 11/07/23  HM DIABETES EYE EXAM  Result Value Ref Range   HM Diabetic Eye Exam No Retinopathy No Retinopathy   Lab Results  Component Value Date   HGBA1C 6.8 (H) 11/06/2023   Lab Results  Component Value Date   NA 140 11/06/2023   CL 104 11/06/2023   K 5.2 (H) 11/06/2023   CO2 27 11/06/2023   BUN 24 (H) 11/06/2023   CREATININE 1.29 11/06/2023  GFR 52.91 (L) 11/06/2023   CALCIUM 9.5 11/06/2023   PHOS 3.2 08/15/2022   ALBUMIN 4.3 11/06/2023   GLUCOSE 146 (H) 11/06/2023    Lab Results  Component Value Date   CHOL 135 11/06/2023   HDL 34.60 (L) 11/06/2023   LDLCALC 53 11/06/2023   LDLDIRECT 45.0 07/21/2021   TRIG 238.0 (H) 11/06/2023   CHOLHDL 4 11/06/2023   Assessment & Plan:   Problem List Items Addressed This Visit      Advanced care planning/counseling discussion (Chronic)    Advanced directives: does not want prolonged life support. Ok for reversible cause. Wife would be HCPOA. Advanced directive packet previously provided.       Health maintenance examination - Primary (Chronic)    Preventative protocols reviewed and updated unless pt declined. Discussed healthy diet and lifestyle.       Dyslipidemia associated with type 2 diabetes mellitus (HCC)    Chronic, triglycerides high despite crestor and fish oil but LDL at goal.  The 10-year ASCVD risk score (Arnett DK, et al., 2019) is: 76.5%   Values used to calculate the score:     Age: 67 years     Sex: Male     Is Non-Hispanic African American: No     Diabetic: Yes     Tobacco smoker: No     Systolic Blood Pressure: 172 mmHg     Is BP treated: Yes     HDL Cholesterol: 34.6 mg/dL     Total Cholesterol: 135 mg/dL       Relevant Medications   metFORMIN (GLUCOPHAGE) 500 MG tablet   rosuvastatin (CRESTOR) 5 MG tablet   Semaglutide,0.25 or 0.5MG /DOS, (OZEMPIC, 0.25 OR 0.5 MG/DOSE,) 2 MG/3ML SOPN   Gout    Urate levels elevated, however no recent gout flare. He does have colchicine at home to use PRN.       Essential hypertension    Chronic, deteriorated despite carvedilol and diltiazem.  Start lasix 20mg  every other day.  RTC 2 wks for BMP I gave him BP log sheet to start monitoring readings at home, drop off in 2 wks to review.       Relevant Medications   apixaban (ELIQUIS) 5 MG TABS tablet   carvedilol (COREG) 6.25 MG tablet   diltiazem (CARDIZEM CD) 240 MG 24 hr capsule   rosuvastatin (CRESTOR) 5 MG tablet   furosemide (LASIX) 20 MG tablet   Other Relevant Orders   Basic metabolic panel   Type 2 diabetes mellitus with other specified complication (HCC)    Chronic, stable on current regimen - continues metformin 500mg  bid and ozempic 0.5mg  weekly.      Relevant Medications   metFORMIN (GLUCOPHAGE) 500 MG tablet   rosuvastatin  (CRESTOR) 5 MG tablet   Semaglutide,0.25 or 0.5MG /DOS, (OZEMPIC, 0.25 OR 0.5 MG/DOSE,) 2 MG/3ML SOPN   GERD (gastroesophageal reflux disease)    Chronic, stable period on pretty regular PPI use.       Relevant Medications   omeprazole (PRILOSEC) 40 MG capsule   Vitamin B12 deficiency    Levels dropping - will increase b12 replacement to MWF dosing.       Obesity, Class I, BMI 30-34.9    Encourage healthy diet and lifestyle choices to affect sustainable weight loss.       Type 2 diabetes mellitus with diabetic neuropathy, unspecified (HCC)    Notes ongoing difficulty with neuropathy, presumed diabetic.  Workup to date unrevealing.  ALA supplement and gabapentin didn't help  Relevant Medications   metFORMIN (GLUCOPHAGE) 500 MG tablet   rosuvastatin (CRESTOR) 5 MG tablet   Semaglutide,0.25 or 0.5MG /DOS, (OZEMPIC, 0.25 OR 0.5 MG/DOSE,) 2 MG/3ML SOPN   Thoracic aortic ectasia (HCC)    Normal thoracic aortic root on latest echo 08/2022.       Relevant Medications   apixaban (ELIQUIS) 5 MG TABS tablet   carvedilol (COREG) 6.25 MG tablet   diltiazem (CARDIZEM CD) 240 MG 24 hr capsule   rosuvastatin (CRESTOR) 5 MG tablet   furosemide (LASIX) 20 MG tablet   Diabetic cataract of both eyes (HCC)    Followed by eye doctor.      Relevant Medications   metFORMIN (GLUCOPHAGE) 500 MG tablet   rosuvastatin (CRESTOR) 5 MG tablet   Semaglutide,0.25 or 0.5MG /DOS, (OZEMPIC, 0.25 OR 0.5 MG/DOSE,) 2 MG/3ML SOPN   Atrial fibrillation with RVR (HCC)    Appreciate cardiology care. Continue eliquis and carvedilol.  He notes ongoing trouble with fatigue on BB.  Rec call to schedule cards f/u visit.       Relevant Medications   apixaban (ELIQUIS) 5 MG TABS tablet   carvedilol (COREG) 6.25 MG tablet   diltiazem (CARDIZEM CD) 240 MG 24 hr capsule   rosuvastatin (CRESTOR) 5 MG tablet   furosemide (LASIX) 20 MG tablet   (HFpEF) heart failure with preserved ejection fraction (HCC)    Seems  euvolemic.  Continues carvedilol. Avoiding ARB in recent hyperkalemia and h/o ACEI angioedema.  Start lasix 20mg  QOD, consider SGLT2i.       Relevant Medications   apixaban (ELIQUIS) 5 MG TABS tablet   carvedilol (COREG) 6.25 MG tablet   diltiazem (CARDIZEM CD) 240 MG 24 hr capsule   rosuvastatin (CRESTOR) 5 MG tablet   furosemide (LASIX) 20 MG tablet   Chronic fatigue    Has declined HST.  Possibly BB related.  Some improvement with switch from metoprolol to carvedilol.       Other Visit Diagnoses     Special screening for malignant neoplasms, colon       Relevant Orders   Fecal occult blood, imunochemical        Meds ordered this encounter  Medications   apixaban (ELIQUIS) 5 MG TABS tablet    Sig: Take 1 tablet (5 mg total) by mouth 2 (two) times daily.    Dispense:  60 tablet    Refill:  12   omeprazole (PRILOSEC) 40 MG capsule    Sig: Take 1 capsule (40 mg total) by mouth daily as needed.    Dispense:  90 capsule    Refill:  4   Coenzyme Q10 100 MG capsule    Sig: Take 1 capsule (100 mg total) by mouth at bedtime.   carvedilol (COREG) 6.25 MG tablet    Sig: Take 1 tablet (6.25 mg total) by mouth 2 (two) times daily with a meal.    Dispense:  180 tablet    Refill:  4   diltiazem (CARDIZEM CD) 240 MG 24 hr capsule    Sig: Take 1 capsule (240 mg total) by mouth daily.    Dispense:  90 capsule    Refill:  4   metFORMIN (GLUCOPHAGE) 500 MG tablet    Sig: Take 1 tablet (500 mg total) by mouth 2 (two) times daily with a meal. For diabetes    Dispense:  180 tablet    Refill:  4    See new dose   rosuvastatin (CRESTOR) 5 MG tablet    Sig:  Take 1 tablet (5 mg total) by mouth daily.    Dispense:  90 tablet    Refill:  4   cyanocobalamin (VITAMIN B12) 1000 MCG tablet    Sig: Take 1 tablet (1,000 mcg total) by mouth every Monday, Wednesday, and Friday.   furosemide (LASIX) 20 MG tablet    Sig: Take 1 tablet (20 mg total) by mouth every other day.    Dispense:  45  tablet    Refill:  3    Orders Placed This Encounter  Procedures   Fecal occult blood, imunochemical    Standing Status:   Future    Standing Expiration Date:   11/12/2024   Basic metabolic panel    Standing Status:   Future    Standing Expiration Date:   11/12/2024    Patient Instructions  Pass by lab to pick up stool kit.  BP was too high - start lasix 20mg  every other day. Continue monitoring at home and record readings using BP log sheet, then drop off in 2 weeks to review readings. Return in 2 weeks for lab visit only to recheck potassium and kidneys.  Increase b12 vitamin to 2-3 times a week.  Call Dr Hochrein's office cardiology to schedule follow up visit.  Return in 3 months for follow up visit.   Follow up plan: Return in about 3 months (around 02/11/2024) for follow up visit.  Eustaquio Boyden, MD

## 2023-11-13 NOTE — Assessment & Plan Note (Signed)
Encourage healthy diet and lifestyle choices to affect sustainable weight loss.  

## 2023-11-13 NOTE — Assessment & Plan Note (Addendum)
Seems euvolemic.  Continues carvedilol. Avoiding ARB in recent hyperkalemia and h/o ACEI angioedema.  Start lasix 20mg  QOD, consider SGLT2i.

## 2023-11-13 NOTE — Assessment & Plan Note (Signed)
Chronic, stable period on pretty regular PPI use.

## 2023-11-13 NOTE — Assessment & Plan Note (Addendum)
Normal thoracic aortic root on latest echo 08/2022.

## 2023-11-13 NOTE — Assessment & Plan Note (Signed)
Preventative protocols reviewed and updated unless pt declined. Discussed healthy diet and lifestyle.  

## 2023-11-13 NOTE — Assessment & Plan Note (Signed)
Chronic, triglycerides high despite crestor and fish oil but LDL at goal.  The 10-year ASCVD risk score (Arnett DK, et al., 2019) is: 76.5%   Values used to calculate the score:     Age: 77 years     Sex: Male     Is Non-Hispanic African American: No     Diabetic: Yes     Tobacco smoker: No     Systolic Blood Pressure: 172 mmHg     Is BP treated: Yes     HDL Cholesterol: 34.6 mg/dL     Total Cholesterol: 135 mg/dL

## 2023-11-13 NOTE — Assessment & Plan Note (Signed)
Followed by eye doctor.  

## 2023-11-13 NOTE — Assessment & Plan Note (Addendum)
Appreciate cardiology care. Continue eliquis and carvedilol.  He notes ongoing trouble with fatigue on BB.  Rec call to schedule cards f/u visit.

## 2023-11-13 NOTE — Assessment & Plan Note (Signed)
Levels dropping - will increase b12 replacement to MWF dosing.

## 2023-11-13 NOTE — Assessment & Plan Note (Addendum)
Notes ongoing difficulty with neuropathy, presumed diabetic.  Workup to date unrevealing.  ALA supplement and gabapentin didn't help

## 2023-11-13 NOTE — Patient Instructions (Addendum)
Pass by lab to pick up stool kit.  BP was too high - start lasix 20mg  every other day. Continue monitoring at home and record readings using BP log sheet, then drop off in 2 weeks to review readings. Return in 2 weeks for lab visit only to recheck potassium and kidneys.  Increase b12 vitamin to 2-3 times a week.  Call Dr Hochrein's office cardiology to schedule follow up visit.  Return in 3 months for follow up visit.

## 2023-11-13 NOTE — Assessment & Plan Note (Signed)
Advanced directives: does not want prolonged life support. Ok for reversible cause. Wife would be HCPOA. Advanced directive packet previously provided.

## 2023-11-13 NOTE — Assessment & Plan Note (Addendum)
Chronic, deteriorated despite carvedilol and diltiazem.  Start lasix 20mg  every other day.  RTC 2 wks for BMP I gave him BP log sheet to start monitoring readings at home, drop off in 2 wks to review.

## 2023-11-13 NOTE — Assessment & Plan Note (Signed)
Urate levels elevated, however no recent gout flare. He does have colchicine at home to use PRN.

## 2023-11-13 NOTE — Assessment & Plan Note (Signed)
Has declined HST.  Possibly BB related.  Some improvement with switch from metoprolol to carvedilol.

## 2023-11-13 NOTE — Assessment & Plan Note (Signed)
Chronic, stable on current regimen - continues metformin 500mg  bid and ozempic 0.5mg  weekly.

## 2023-11-27 ENCOUNTER — Telehealth: Payer: Self-pay

## 2023-11-27 ENCOUNTER — Other Ambulatory Visit (INDEPENDENT_AMBULATORY_CARE_PROVIDER_SITE_OTHER): Payer: Self-pay

## 2023-11-27 ENCOUNTER — Telehealth: Payer: Self-pay | Admitting: Family Medicine

## 2023-11-27 ENCOUNTER — Other Ambulatory Visit (INDEPENDENT_AMBULATORY_CARE_PROVIDER_SITE_OTHER): Payer: Medicare HMO

## 2023-11-27 DIAGNOSIS — I10 Essential (primary) hypertension: Secondary | ICD-10-CM | POA: Diagnosis not present

## 2023-11-27 DIAGNOSIS — Z1211 Encounter for screening for malignant neoplasm of colon: Secondary | ICD-10-CM

## 2023-11-27 DIAGNOSIS — R195 Other fecal abnormalities: Secondary | ICD-10-CM

## 2023-11-27 LAB — BASIC METABOLIC PANEL
BUN: 20 mg/dL (ref 6–23)
CO2: 29 meq/L (ref 19–32)
Calcium: 9.4 mg/dL (ref 8.4–10.5)
Chloride: 104 meq/L (ref 96–112)
Creatinine, Ser: 1.24 mg/dL (ref 0.40–1.50)
GFR: 55.46 mL/min — ABNORMAL LOW (ref >60.00)
Glucose, Bld: 131 mg/dL — ABNORMAL HIGH (ref 70–99)
Potassium: 4.5 meq/L (ref 3.5–5.1)
Sodium: 141 meq/L (ref 135–145)

## 2023-11-27 LAB — FECAL OCCULT BLOOD, IMMUNOCHEMICAL: Fecal Occult Bld: POSITIVE — AB

## 2023-11-27 NOTE — Telephone Encounter (Signed)
 Placed log in Dr. Timoteo Expose box.

## 2023-11-27 NOTE — Telephone Encounter (Signed)
PT RECEIVED APPLICATION IN MAIL AND WILL BE MAILING BACK OUT TO [place] FOR RE-ENROLLMENT  2025 FOR OZEMPIC(NOVO NORDISK)   PLEASE BE ADVISED  WHILE WAITING ON PT APPLICATION WILL FAX PROVIDER PAGES TO OFFICE

## 2023-11-27 NOTE — Telephone Encounter (Signed)
Patient came by and dropped off blood pressure log. Placed in box up front. Thank you!

## 2023-11-27 NOTE — Telephone Encounter (Signed)
Received call from [name] relaying pos iFOB result for pt.

## 2023-11-28 MED ORDER — LOSARTAN POTASSIUM 25 MG PO TABS: 25.0000 mg | ORAL_TABLET | Freq: Every day | ORAL | 1 refills | Status: DC

## 2023-11-28 NOTE — Telephone Encounter (Signed)
See other phone note on same date.

## 2023-11-28 NOTE — Telephone Encounter (Addendum)
GI referral placed.  He may call [facility] to schedule an appointment at [phone].

## 2023-11-28 NOTE — Telephone Encounter (Signed)
Spoke with pt relaying Dr Timoteo Expose message. Pt verbalizes understanding and agrees to GI referral. Says he's not sure if he has hemorrhoid, but does feel some type of bulb-like tissue in that area, although denies any irritation.

## 2023-11-28 NOTE — Addendum Note (Signed)
Addended by: Eustaquio Boyden on: 11/28/2023 05:37 PM   Modules accepted: Orders

## 2023-11-28 NOTE — Telephone Encounter (Signed)
BP log reviewed. BPs are staying too high, averaging 160s/90s over the past week.   Recommend start losartan 25mg  daily sent to pharmacy. This can help protect kidneys and lower uric acid (gout) levels in blood. If develops any tongue/lip swelling, stop right away.  Continue carvedilol 6.25mg  BID, diltiazem 240mg  daily, lasix every other day. How has he tolerated new lasix dose?  Kidneys remain stable, potassium ok on every other day lasix.   Also, iFOB returned positive - has he noted any issues with hemorrhoids? Would suggest referral to GI to discuss possible colonoscopy.

## 2023-11-28 NOTE — Addendum Note (Signed)
Addended by: Eustaquio Boyden on: 11/28/2023 07:58 AM   Modules accepted: Orders

## 2023-11-29 NOTE — Telephone Encounter (Signed)
Spoke with pt relaying Dr. G's message.  Pt verbalizes understanding and expresses his thanks.  

## 2023-11-30 ENCOUNTER — Encounter: Payer: Self-pay | Admitting: Gastroenterology

## 2023-12-14 ENCOUNTER — Ambulatory Visit: Payer: Medicare HMO | Attending: Nurse Practitioner | Admitting: Emergency Medicine

## 2023-12-14 ENCOUNTER — Encounter: Payer: Self-pay | Admitting: Emergency Medicine

## 2023-12-14 VITALS — BP 120/68 | HR 69 | Ht 71.0 in | Wt 227.4 lb

## 2023-12-14 DIAGNOSIS — I5032 Chronic diastolic (congestive) heart failure: Secondary | ICD-10-CM | POA: Diagnosis not present

## 2023-12-14 DIAGNOSIS — I480 Paroxysmal atrial fibrillation: Secondary | ICD-10-CM

## 2023-12-14 DIAGNOSIS — E118 Type 2 diabetes mellitus with unspecified complications: Secondary | ICD-10-CM

## 2023-12-14 DIAGNOSIS — I10 Essential (primary) hypertension: Secondary | ICD-10-CM

## 2023-12-14 DIAGNOSIS — E785 Hyperlipidemia, unspecified: Secondary | ICD-10-CM

## 2023-12-14 NOTE — Patient Instructions (Signed)
 Medication Instructions:  Continue all current medications *If you need a refill on your cardiac medications before your next appointment, please call your pharmacy*   Lab Work: none If you have labs (blood work) drawn today and your tests are completely normal, you will receive your results only by: MyChart Message (if you have MyChart) OR A paper copy in the mail If you have any lab test that is abnormal or we need to change your treatment, we will call you to review the results.   Testing/Procedures: none   Follow-Up: At [facility], you and your health needs are our priority.  As part of our continuing mission to provide you with exceptional heart care, we have created designated Provider Care Teams.  These Care Teams include your primary Cardiologist (physician) and Advanced Practice Providers (APPs -  Physician Assistants and Nurse Practitioners) who all work together to provide you with the care you need, when you need it.  We recommend signing up for the patient portal called MyChart.  Sign up information is provided on this After Visit Summary.  MyChart is used to connect with patients for Virtual Visits (Telemedicine).  Patients are able to view lab/test results, encounter notes, upcoming appointments, etc.  Non-urgent messages can be sent to your provider as well.   To learn more about what you can do with MyChart, go to [email or web address].    Your next appointment:   1 year(s)  Provider:   Lynwood Schilling, MD     Other Instructions none

## 2023-12-14 NOTE — Progress Notes (Signed)
 Cardiology Office Note:    Date:  12/14/2023  ID:  Devin Huffman, DOB [birth year], MRN [id number] PCP: Rilla Baller, MD  [facility] Providers Cardiologist:  Lynwood Schilling, MD       Patient Profile:      Devin Huffman is a 78 year old male with visit pertinent history of chronic diastolic CHF, paroxysmal atrial fibrillation, hypertension, hyperlipidemia, type 2 diabetes, GERD.  Patient was for seen by cardiology during hospitalization from 08/13/2022 to 08/16/2022 with newly diagnosed diastolic CHF and newly diagnosed atrial fibrillation with RVR.  He presented to the ED with complaints of shortness of breath and chest discomfort.  BNP at that time was 439.  High sensitive troponin was negative.  Echocardiogram showed LVEF of 50-55% with mild LVH, mildly enlarged RV, mildly reduced systolic function and mildly elevated PASP.  He was diuresed with IV Lasix  with good response and then transition to p.o. Lasix  20 mg daily.  He was started on Eliquis , Lopressor , diltiazem  and converted back into normal sinus rhythm.  His chest pain did resolve with the treatment and this was felt to be due to volume overload in the setting of A-fib with RVR.  He was last seen in clinic on 11/13/2022 and he was doing well and denied any cardiovascular symptoms.       History of Present Illness:  Devin Huffman is a 78 y.o. male who returns for 1 year follow-up for diastolic heart failure and atrial fibrillation.  Today patient arrives into clinic with his wife.  He reports no cardiovascular complaints or concerns over the past year.  He notes he gets regular exercise as he does yard work often due to living [place] over [place] acres [place] land.  He notes that his activity is somewhat limited due to his peripheral neuropathy as he often deals with burning/tingling to his feet.  He has been without tachycardia, palpitations, irregular heart rhythms over the past year.  His Lopressor  has been switched to carvedilol  in the  past year due to chronic fatigue.  Blood pressure has been uncontrolled over the past year. His PCP recently started him on losartan  and his Lasix  was changed to 3 times a week. He does currently monitor his BP at home.  He has had no problems with orthopnea, PND, DOE, SOB.  He has had no falls, head injuries, bleeding concerns over the past year.  He denies chest pain, shortness of breath, lower extremity edema, fatigue, palpitations, melena, hematuria, hemoptysis, diaphoresis, weakness, presyncope, syncope, orthopnea, and PND.       Review of Systems  Constitutional: Negative for weight gain and weight loss.  Cardiovascular:  Negative for chest pain, claudication, cyanosis, dyspnea on exertion, irregular heartbeat, leg swelling, near-syncope, orthopnea, palpitations, paroxysmal nocturnal dyspnea and syncope.  Respiratory:  Negative for cough, hemoptysis and shortness of breath.   Gastrointestinal:  Negative for abdominal pain, hematochezia and melena.  Genitourinary:  Negative for hematuria.  Neurological:  Negative for dizziness and light-headedness.     See HPI    Studies Reviewed:       Echocardiogram 08/14/2022 1. Left ventricular ejection fraction, by estimation, is 50 to 55%. The  left ventricle has low normal function. Left ventricular endocardial  border not optimally defined to evaluate regional wall motion. There is  mild left ventricular hypertrophy. Left  ventricular diastolic parameters are indeterminate.   2. Right ventricular systolic function is mildly reduced. The right  ventricular size is mildly enlarged. There is mildly elevated pulmonary  artery systolic pressure. The estimated right ventricular systolic  pressure is 37.7 mmHg.   3. The mitral valve is grossly normal. Trivial mitral valve  regurgitation. No evidence of mitral stenosis.   4. The aortic valve is grossly normal. There is mild calcification of the  aortic valve. Aortic valve regurgitation is not  visualized. No aortic  stenosis is present.   5. The inferior vena cava is dilated in size with >50% respiratory  variability, suggesting right atrial pressure of 8 mmHg.  Risk Assessment/Calculations:    CHA2DS2-VASc Score = 6   This indicates a 9.7% annual risk of stroke. The patient's score is based upon: CHF History: 1 HTN History: 1 Diabetes History: 1 Stroke History: 0 Vascular Disease History: 1 Age Score: 2 Gender Score: 0            Physical Exam:   VS:  BP 120/68 (BP Location: Left Arm, Patient Position: Sitting, Cuff Size: Large)   Pulse 69   Ht 5' 11 (1.803 m)   Wt 227 lb 6.4 oz (103.1 kg)   BMI 31.72 kg/m    Wt Readings from Last 3 Encounters:  12/14/23 227 lb 6.4 oz (103.1 kg)  11/13/23 223 lb (101.2 kg)  08/02/23 219 lb 14.4 oz (99.7 kg)    Constitutional:      Appearance: Normal and healthy appearance.  Neck:     Vascular: JVD normal.  Pulmonary:     Effort: Pulmonary effort is normal.     Breath sounds: Normal breath sounds.  Chest:     Chest wall: Not tender to palpatation.  Cardiovascular:     PMI at left midclavicular line. Normal rate. Regular rhythm. Normal S1. Normal S2.      Murmurs: There is no murmur.     No gallop.  No click. No rub.  Pulses:    Intact distal pulses.  Edema:    Peripheral edema absent.  Musculoskeletal: Normal range of motion.     Cervical back: Normal range of motion and neck supple. Skin:    General: Skin is warm and dry.  Neurological:     General: No focal deficit present.     Mental Status: Alert and oriented to person, place and time.  Psychiatric:        Behavior: Behavior is cooperative.        Assessment and Plan:  Chronic diastolic CHF -Echocardiogram 08/2022 with LVEF 50-55%, no RWMA, mild LVH, diastolic parameters indeterminate, RV SF mildly reduced, PASP 37.7 -He is euvolemic and well compensated on exam.  Overall weight has been stable.  He was not taking his loop diuretic most of the year however  PCP has recommended him taking it 3 times weekly to help with blood pressure control.   -Pt declines escalation of GDMT at this time. Consider SGLT2i or spirolactone in the future.  Continue GDMT carvedilol  6.25 mg twice daily, losartan  25 mg daily, Lasix  20 mg 3 times weekly.  Consider repeat echo as clinically indicated.   Paroxysmal atrial fibrillation -Quiescent.  No reoccurrence of A-fib since initial event in 08/2022, he is maintaining sinus rhythm with EKG today showing NSR with no ST-T wave changes.  He has denied any palpitations, tachycardia, irregular heart rhythms over the past year. -Continue carvedilol  6.25 mg twice daily, diltiazem  240 mg daily, Eliquis  5 mg twice daily -CHA2DS2-VASc Score = 6 [CHF History: 1, HTN History: 1, Diabetes History: 1, Stroke History: 0, Vascular Disease History: 1, Age Score: 2, Gender Score: 0].  Therefore, the patient's annual risk of stroke is 9.7 %.      Hypertension -Under excellent control today as blood pressure is 120/68.  Recommend he continue monitor BP at home and let us  know if his pressures are consistently greater than 130/80, if so can add spironolactone at that time. -Managed on carvedilol  6.25 mg twice daily, diltiazem  240 mg daily, losartan  25 mg daily  Hyperlipidemia -LDL 53 on 11/06/2023 and under excellent control at this time.  Continue rosuvastatin  5 mg daily.  I recommend heart healthy and Mediterranean diet options.  Focus on increasing fiber, fruits, vegetables in diet.  Work on decreasing processed foods and foods high in sugar.  Type 2 diabetes -A1c 6.8 on 10/2023.  Currently managed by PCP.                Dispo:  Return in about 1 year (around 12/13/2024).  Signed, Lum LITTIE Louis, NP

## 2023-12-20 NOTE — Telephone Encounter (Signed)
 RECEIVED PT PAGES FAXED PAP: Application TO PROVIDER OFFICE OF [facility] for Ozempic FOR REVIEW   PLEASE BE ADVISED

## 2023-12-25 ENCOUNTER — Telehealth: Payer: Self-pay

## 2023-12-25 NOTE — Telephone Encounter (Signed)
 Copied from CRM [id number]. Topic: Clinical - Prescription Issue >> Dec 25, 2023 12:49 PM Curlee DEL wrote: Reason for CRM: Patient typically has to apply for [facility] medication every year - they have done that, however, they haven't heard anything back and the medication is typically shipped to the office. Can you assist with the status of their application and shipment? Patient's spouse can be reached at [phone] or [phone]

## 2023-12-27 NOTE — Telephone Encounter (Signed)
Printed PAP app and placed in Dr Timoteo Expose box.

## 2023-12-27 NOTE — Telephone Encounter (Signed)
Copied from CRM [id number]. Topic: Clinical - Prescription Issue >> Dec 27, 2023  3:22 PM Elizebeth Brooking wrote: Reason for CRM: Patient wife called about the status of the wegovy medication she is requesting a callback from the nurse at [phone]

## 2023-12-27 NOTE — Telephone Encounter (Addendum)
Looks like pt signed form and it was scanned into pt's chart under 'Media' tab on 12/21/23.   Do I need to print it an place it in Lindsay's folder at the front office?  Also, spoke with pt's wife, Steward Drone (on dpr), notifying her we are working on [employer]. She verbalizes understanding.

## 2023-12-28 NOTE — Telephone Encounter (Signed)
This is for ozempic. Signed and in Lisa's box.

## 2023-12-28 NOTE — Telephone Encounter (Addendum)
Ericka Pontiff form to [facility] Medication Advocate Team at [phone].  Placed form to be scanned.

## 2023-12-31 NOTE — Telephone Encounter (Signed)
HAVE RECEIVED PT PAGES AND PROVIDER PAGES PAP: Application for Ozempic has been submitted to [employer], via fax  PLEASE BE ADVISE

## 2024-01-15 ENCOUNTER — Telehealth: Payer: Self-pay

## 2024-01-15 NOTE — Telephone Encounter (Addendum)
 Received pt's med shipment of Ozempic  0.25 mg, 0.5 mg (4 boxes total).    Attempted to contact pt. No answer, no vm. Need to notify him of above shipment.    [Placed Ozempic  (4 boxes total) in 2nd refrigerator.]

## 2024-01-16 NOTE — Telephone Encounter (Signed)
 Spoke with pt notifying him of Ozempic  shipment. Pt expresses his thanks and will come get it.

## 2024-01-17 NOTE — Telephone Encounter (Signed)
 Wife came in and picked up medication

## 2024-01-21 NOTE — Telephone Encounter (Addendum)
 PAP: Patient assistance application for Ozempic  has been approved by PAP Companies: [employer] from 12/31/2023 to 12/10/2024. Medication should be delivered to PAP Delivery: Provider's office. For further shipping updates, please contact [employer] at [phone]. Patient ID is: NO ID   PLEASE BE ADVISED

## 2024-02-15 ENCOUNTER — Ambulatory Visit: Payer: Medicare HMO | Admitting: Gastroenterology

## 2024-02-15 ENCOUNTER — Encounter: Payer: Self-pay | Admitting: Gastroenterology

## 2024-02-15 ENCOUNTER — Telehealth: Payer: Self-pay

## 2024-02-15 VITALS — BP 122/74 | HR 58 | Ht 71.0 in | Wt 227.0 lb

## 2024-02-15 DIAGNOSIS — K219 Gastro-esophageal reflux disease without esophagitis: Secondary | ICD-10-CM

## 2024-02-15 DIAGNOSIS — Z7901 Long term (current) use of anticoagulants: Secondary | ICD-10-CM

## 2024-02-15 DIAGNOSIS — R195 Other fecal abnormalities: Secondary | ICD-10-CM | POA: Diagnosis not present

## 2024-02-15 MED ORDER — SUTAB 1479-225-188 MG PO TABS
ORAL_TABLET | ORAL | 0 refills | Status: DC
Start: 2024-02-15 — End: 2024-04-07

## 2024-02-15 NOTE — Patient Instructions (Addendum)
 You have been scheduled for a colonoscopy. Please follow written instructions given to you at your visit today.   Note:  You will be contacted by our office prior to your procedure for directions on holding your ELIQUIS.  If you do not hear from our office 1 week prior to your scheduled procedure, please call [phone] to discuss.   If you use inhalers (even only as needed), please bring them with you on the day of your procedure.  DO NOT TAKE 7 DAYS PRIOR TO TEST- Trulicity (dulaglutide) Ozempic, Wegovy (semaglutide) Mounjaro (tirzepatide) Bydureon Bcise (exanatide extended release)  DO NOT TAKE 1 DAY PRIOR TO YOUR TEST Rybelsus (semaglutide) Adlyxin (lixisenatide) Victoza (liraglutide) Byetta (exanatide) ___________________________________________________________________________  Bonita Quin will receive your bowel preparation through [facility], which ensures the lowest copay and home delivery, with outreach via text or call from an [phone] number. Please respond promptly to avoid rescheduling of your procedure. If you are interested in alternative options or have any questions regarding your prep, please contact them at [phone] ____________________________________________________________________________  Your Provider Has Sent Your Bowel Prep Regimen To [facility]   [facility] will contact you to verify your information and collect your copay, if applicable. Enjoy the comfort of your home while your prescription is mailed to you, FREE of any shipping charges.   [facility] accepts all major insurance benefits and applies discounts & coupons.  Have additional questions?   Chat: [email or web address] Call: [phone] Email: [email or web address] NCPDP: [id number]  How will [facility] contact you?  With a Welcome phone call,  a Welcome text and a checkout link in text form.  Texts you receive from [phone] Are NOT Spam.  *To set up delivery, you must complete the  checkout process via link or speak to one of the patient care representatives. If [facility] is unable to reach you, your prescription may be delayed.  To avoid long hold times on the phone, you may also utilize the secure chat feature on the [facility] website to request that they call you back for transaction completion or to expedite your concerns.   Thank you for entrusting me with your care and for choosing [facility], Dr. Ileene Patrick     If your blood pressure at your visit was 140/90 or greater, please contact your primary care physician to follow up on this. ______________________________________________________  If you are age 47 or older, your body mass index should be between 23-30. Your Body mass index is 31.66 kg/m. If this is out of the aforementioned range listed, please consider follow up with your Primary Care Provider.  If you are age 82 or younger, your body mass index should be between 19-25. Your Body mass index is 31.66 kg/m. If this is out of the aformentioned range listed, please consider follow up with your Primary Care Provider.  ________________________________________________________  The [facility] providers would like to encourage you to use [facility] to communicate with providers for non-urgent requests or questions.  Due to long hold times on the telephone, sending your provider a message by [facility] may be a faster and more efficient way to get a response.  Please allow 48 business hours for a response.  Please remember that this is for non-urgent requests.  _______________________________________________________  Due to recent changes in healthcare laws, you may see the results of your imaging and laboratory studies on MyChart before your provider has had a chance to review them.  We understand that in some cases there may be results that are confusing or concerning  to you. Not all laboratory results come back in the same time frame and the provider  may be waiting for multiple results in order to interpret others.  Please give [place] 48 hours in order for your provider to thoroughly review all the results before contacting the office for clarification of your results.

## 2024-02-15 NOTE — Telephone Encounter (Signed)
  Devin Huffman 03/26/1946 [id number]  02/15/24  Dear Dr. Sharen Hones:  We have scheduled the above named patient for a(n) colonoscopy procedure. Our records show that (s)he is on anticoagulation therapy.  Please advise as to whether the patient may come off their therapy of ELIQUIS 2 days prior to their procedure which is scheduled for 04-07-24.  Please route your response to [facility], CMA or fax response to [phone].  Sincerely,    [facility]

## 2024-02-15 NOTE — Progress Notes (Signed)
 HPI :  78 year old male history of A-fib on Eliquis, diabetes, hemorrhoids, here to reestablish care and referred by Dr. Eustaquio Boyden for positive FIT stool test.  This is my first time seeing the patient, he previously was followed by Dr. Russella Dar who has since retired.  His last colonoscopy was performed in February 2019 which was normal without polyps he had some diverticulosis and internal hemorrhoids on exam.  He remotely had 2 adenomas back in 2012.  He had a FIT stool test for screening in December.  This returned positive.  He denies any overt blood in his stools.  Denies any problems with his bowel habits.  No diarrhea or constipation.  No abdominal pains.  He eats well.  I noted he takes omeprazole for reflux.  He states this is mild and rare where he actually needs it and mostly does not take it.  Has not had this longstanding, takes it periodically.  Never had an EGD.  No family history of esophageal cancer.  No dysphagia.  Does take Eliquis for atrial fibrillation and also Ozempic for history diabetes.  Denies any cardiopulmonary symptoms, states A-fib is well-controlled  Prior workup: Colonoscopy 01/22/2018 - - The perianal and digital rectal examinations were normal. - Multiple medium-mouthed diverticula were found in the left colon. There was no evidence of diverticular bleeding. - Internal hemorrhoids were found during retroflexion. The hemorrhoids were medium-sized and Grade I (internal hemorrhoids that do not prolapse). - The exam was otherwise without abnormality on direct and retroflexion views.  Colonoscopy 03/2011 - 7 and 8mm polyp removed - adenoma  Echo 08/14/22: 1. Left ventricular ejection fraction, by estimation, is 50 to 55%. The  left ventricle has low normal function. Left ventricular endocardial  border not optimally defined to evaluate regional wall motion. There is  mild left ventricular hypertrophy. Left  ventricular diastolic parameters are indeterminate.    2. Right ventricular systolic function is mildly reduced. The right  ventricular size is mildly enlarged. There is mildly elevated pulmonary  artery systolic pressure. The estimated right ventricular systolic  pressure is 37.7 mmHg.   3. The mitral valve is grossly normal. Trivial mitral valve  regurgitation. No evidence of mitral stenosis.   4. The aortic valve is grossly normal. There is mild calcification of the  aortic valve. Aortic valve regurgitation is not visualized. No aortic  stenosis is present.   5. The inferior vena cava is dilated in size with >50% respiratory  variability, suggesting right atrial pressure of 8 mmHg.    Past Medical History:  Diagnosis Date   Acute pulmonary edema (HCC) 08/13/2022   Atrial fibrillation with RVR (HCC) 08/13/2022   Cancer (HCC)    skin cancer removed from nose   Chronic diastolic CHF (congestive heart failure) (HCC)    COVID-19 virus infection 11/23/2021   Diabetes mellitus without complication (HCC)    Diabetic cataract of both eyes (HCC) 07/12/2020   Dyslipidemia associated with type 2 diabetes mellitus (HCC) 07/15/2009   Essential hypertension 06/23/2008   GERD (gastroesophageal reflux disease)    occasional   Gout    ?due to L great toe pain, saw Dr Darrelyn Hillock   History of chicken pox    Hyperlipidemia    Hypertension    Legg-Perthes disease    Right hip   Nasal septal deviation    monitoring - [facility]   OA (osteoarthritis) of hip 03/07/2017   Pleural effusion 08/13/2022   Pneumonia due to COVID-19 virus 01/21/2020   Sepsis (  HCC)    In March 2018   Type 2 diabetes mellitus with diabetic neuropathy, unspecified (HCC) 08/31/2016   SPEP normal 08/2016  Undergoing B12 replacement.        Past Surgical History:  Procedure Laterality Date   COLONOSCOPY  03/2011   2 tubular adenomas, rec rpt 5 yrs   COLONOSCOPY  01/2018   diverticulosis, rpt 5 yrs Russella Dar)   HERNIA REPAIR     double hernia   LUMBAR FUSION  04/2013    transforaminal interbody fusion L4/5 (Dumonski) for R L5 radiculopathy, L4/5 spondylolisthesis, and L4/5 HNP with compression of spinal cord   TONSILLECTOMY     TOTAL HIP ARTHROPLASTY Right 03/07/2017   Procedure: RIGHT TOTAL HIP ARTHROPLASTY ANTERIOR APPROACH;  Surgeon: Ollen Gross, MD;  Location: [facility];  Service: Orthopedics;  Laterality: Right;   VASECTOMY     WISDOM TOOTH EXTRACTION     Family History  Problem Relation Age of Onset   Breast cancer Mother        with recurrence   Alzheimer's disease Father    Cervical cancer Sister 25       metastatic to lungs and brain   Stroke Maternal Grandfather    Cancer Maternal Aunt        ?   Hypertension Maternal Uncle    Stroke Maternal Uncle    Coronary artery disease Paternal Uncle        MI   Cancer Other        niece-2 types   Social History   Tobacco Use   Smoking status: Never   Smokeless tobacco: Never  Vaping Use   Vaping status: Never Used  Substance Use Topics   Alcohol use: No   Drug use: No   Current Outpatient Medications  Medication Sig Dispense Refill   apixaban (ELIQUIS) 5 MG TABS tablet Take 1 tablet (5 mg total) by mouth 2 (two) times daily. 60 tablet 12   Ascorbic Acid (VITAMIN C) 1000 MG tablet Take 1,000 mg by mouth every evening.      blood glucose meter kit and supplies KIT Dispense based on patient and insurance preference. Use to check sugars up to twice daily as directed (E11.69). 1 each 0   Calcium Carbonate Antacid (TUMS CHEWY BITES PO) Take 2 tablets by mouth at bedtime as needed (reflux).     carvedilol (COREG) 6.25 MG tablet Take 1 tablet (6.25 mg total) by mouth 2 (two) times daily with a meal. 180 tablet 4   Cholecalciferol (VITAMIN D3) 25 MCG (1000 UT) CAPS Take 2 capsules (2,000 Units total) by mouth every evening.     Coenzyme Q10 100 MG capsule Take 1 capsule (100 mg total) by mouth at bedtime.     cyanocobalamin (VITAMIN B12) 1000 MCG tablet Take 1 tablet (1,000 mcg total) by mouth every  Monday, Wednesday, and Friday.     diltiazem (CARDIZEM CD) 240 MG 24 hr capsule Take 1 capsule (240 mg total) by mouth daily. 90 capsule 4   furosemide (LASIX) 20 MG tablet Take 1 tablet (20 mg total) by mouth every other day. 45 tablet 3   glucose blood (GLUCOSE METER TEST) test strip Use as instructed to check sugars twice daily as needed E11.69 100 each 12   losartan (COZAAR) 25 MG tablet Take 1 tablet (25 mg total) by mouth daily. 90 tablet 1   metFORMIN (GLUCOPHAGE) 500 MG tablet Take 1 tablet (500 mg total) by mouth 2 (two) times daily with a  meal. For diabetes 180 tablet 4   Multiple Vitamins-Minerals (EYE VITAMINS PO) Take 2 tablets by mouth at bedtime.     Omega-3 Fatty Acids (FISH OIL PO) Take 1 capsule by mouth daily.     omeprazole (PRILOSEC) 40 MG capsule Take 1 capsule (40 mg total) by mouth daily as needed. 90 capsule 4   rosuvastatin (CRESTOR) 5 MG tablet Take 1 tablet (5 mg total) by mouth daily. 90 tablet 4   Semaglutide,0.25 or 0.5MG /DOS, (OZEMPIC, 0.25 OR 0.5 MG/DOSE,) 2 MG/3ML SOPN Inject 0.5 mg as directed once a week.     No current facility-administered medications for this visit.   Allergies  Allergen Reactions   Lisinopril Hives and Swelling    angioedema - lip swelling, hives     Review of Systems: All systems reviewed and negative except where noted in HPI.   Lab Results  Component Value Date   WBC 9.8 11/06/2023   HGB 13.9 11/06/2023   HCT 42.0 11/06/2023   MCV 99.9 11/06/2023   PLT 272.0 11/06/2023      Physical Exam: BP 122/74   Pulse (!) 58   Ht 5\' 11"  (1.803 m)   Wt 227 lb (103 kg)   BMI 31.66 kg/m  Constitutional: Pleasant,well-developed, male in no acute distress. HEENT: Normocephalic and atraumatic. Conjunctivae are normal. No scleral icterus. Neck supple.  Cardiovascular: Normal rate, regular rhythm.  Pulmonary/chest: Effort normal and breath sounds normal. No wheezing, rales or rhonchi. Abdominal: Soft, nondistended, nontender.  There are no masses palpable. No hepatomegaly. Extremities: no edema Lymphadenopathy: No cervical adenopathy noted. Neurological: Alert and oriented to person place and time. Skin: Skin is warm and dry. No rashes noted. Psychiatric: Normal mood and affect. Behavior is normal.   ASSESSMENT: 78 y.o. male here for assessment of the following  1. Positive FIT (fecal immunochemical test)   2. Anticoagulated   3. Gastroesophageal reflux disease, unspecified whether esophagitis present    His colonoscopy is up-to-date but he had a (+) FIT.  He is asymptomatic.  Discussed differential diagnosis with him for cause of this test to be positive.  He is anticoagulated which can increase risk of false positive but given his last colonoscopy was in 2019, recommending optical colonoscopy to further evaluate.  Discussed risks and benefits of the procedure and anesthesia with him and he wanted to proceed.  Will seek approval to hold Eliquis for 2 days prior to the exam, he also need to stop Ozempic for 1 week prior to the exam.  Counseled him no further stool testing should be done given he is having colonoscopy.  Especially at his age, if this exam is unremarkable without any high risk lesions, then no further screening / surveillance should be done.  Otherwise reviewed his history of reflux.  Mild intermittent symptoms only in recent years, using omeprazole sparingly.  No alarm symptoms.  He will continue as needed usage, no indications for EGD at this time.  PLAN: - colonoscopy at the [facility] - hold Eliquis for 2 days prior to the exam - hold Ozempic one week prior - continue omeprazole PRN - uses sparingly  Harlin Rain, MD [facility]  CC: Eustaquio Boyden, MD

## 2024-02-18 NOTE — Telephone Encounter (Signed)
 Received request from [facility] for anticoagulant management around upcoming surgical procedure colonoscopy by Dr Adela Lank for positive iFOB.   Current blood thinner: eliquis 5mg  bid Indication for anticoagulation: atrial fibrillation with CHADSVASC2 score of 6, no h/o stroke.  Bleeding risk of procedure: low   Recommend: ok to hold eliquis 2d prior to procedure with recommencement as soon as possible per GI discretion.

## 2024-02-18 NOTE — Telephone Encounter (Signed)
 Called and spoke to patient.  He understands to hold Eliquis on 4-26 and 4-27

## 2024-03-03 ENCOUNTER — Telehealth: Payer: Self-pay

## 2024-03-03 MED ORDER — PREDNISONE 20 MG PO TABS: ORAL_TABLET | ORAL | 0 refills | Status: DC

## 2024-03-03 NOTE — Telephone Encounter (Signed)
 Patient will need appt with any provider for evaluation

## 2024-03-03 NOTE — Addendum Note (Signed)
 Addended by: Eustaquio Boyden on: 03/03/2024 05:10 PM   Modules accepted: Orders

## 2024-03-03 NOTE — Telephone Encounter (Addendum)
 Don't recommend indocin use as he's now on eliquis and both together can lead to increased risk or bleeding. Is this for gout attack? May need to try prednisone instead.   Lab Results  Component Value Date   HGBA1C 6.8 (H) 11/06/2023

## 2024-03-03 NOTE — Telephone Encounter (Signed)
 Copied from CRM [id number]. Topic: General - Other >> Mar 03, 2024 11:59 AM Adele Barthel wrote: Reason for CRM:   Patient is wanting his ears checked and cleaned due to buildup of earwax. He was unsure which provider performed it last time. Requesting that a water flush be performed instead of use of instrument to scrap the ear out. His wife is requesting a call back  #   [phone]

## 2024-03-03 NOTE — Telephone Encounter (Signed)
 Don't see where this has been refilled for patient in a while. Was D/C 05/17/22 with note completed course.

## 2024-03-03 NOTE — Telephone Encounter (Signed)
 Spoke with pt relaying Dr Timoteo Expose message. Pt verbalizes understanding and states he's having a gout flare in L great toe. Pt agrees to prednisone rx and request it sent to [facility].

## 2024-03-03 NOTE — Telephone Encounter (Addendum)
 Prednisone taper sent to pharmacy Rec OV if not improving with treatment.   hesitant to use colchicine in dilt and carvedilol drug interactions

## 2024-03-03 NOTE — Telephone Encounter (Signed)
 Copied from CRM [id number]. Topic: Clinical - Medication Refill >> Mar 03, 2024 12:02 PM Adele Barthel wrote: Most Recent Primary Care Visit:  Provider: [facility]  Department: [facility]  Visit Type: LAB  Date: 11/27/2023  Medication: Indomethacine 50 mg. Medication is not on current list in chart, unable to pend due to current workflow  Has the patient contacted their pharmacy? No (Agent: If no, request that the patient contact the pharmacy for the refill. If patient does not wish to contact the pharmacy document the reason why and proceed with request.) (Agent: If yes, when and what did the pharmacy advise?)  Is this the correct pharmacy for this prescription? Yes If no, delete pharmacy and type the correct one.  This is the patient's preferred pharmacy:   [facility] [address] [place] - [address] [place] [address] Phone: [phone] Fax: [phone]  Has the prescription been filled recently? No  Is the patient out of the medication? Yes  Has the patient been seen for an appointment in the last year OR does the patient have an upcoming appointment? Yes  Can we respond through MyChart? Yes  Agent: Please be advised that Rx refills may take up to 3 business days. We ask that you follow-up with your pharmacy.

## 2024-03-04 ENCOUNTER — Ambulatory Visit (INDEPENDENT_AMBULATORY_CARE_PROVIDER_SITE_OTHER): Admitting: Internal Medicine

## 2024-03-04 ENCOUNTER — Encounter: Payer: Self-pay | Admitting: Internal Medicine

## 2024-03-04 VITALS — BP 138/76 | HR 70 | Temp 98.2°F | Ht 71.0 in | Wt 230.0 lb

## 2024-03-04 DIAGNOSIS — H6123 Impacted cerumen, bilateral: Secondary | ICD-10-CM | POA: Diagnosis not present

## 2024-03-04 NOTE — Assessment & Plan Note (Addendum)
 Will lavage here Discussed peroxide solution and home Rx Hearing fine again after clearing out Can try home treatment or lavage if worsens again

## 2024-03-04 NOTE — Telephone Encounter (Signed)
Spoke with pt relaying Dr. G's message.  Pt verbalizes understanding and expresses his thanks.  

## 2024-03-04 NOTE — Progress Notes (Signed)
 Subjective:    Patient ID: Devin Huffman, male    DOB: 04-Mar-1946, 78 y.o.   MRN: [id number]  HPI Here due to muffled hearing  Not sick Woke last week and noticed "I was like talking in a drum" Has had cerumen impaction in the past No tinnitus No vertigo  Current Outpatient Medications on File Prior to Visit  Medication Sig Dispense Refill   apixaban (ELIQUIS) 5 MG TABS tablet Take 1 tablet (5 mg total) by mouth 2 (two) times daily. 60 tablet 12   Ascorbic Acid (VITAMIN C) 1000 MG tablet Take 1,000 mg by mouth every evening.      blood glucose meter kit and supplies KIT Dispense based on patient and insurance preference. Use to check sugars up to twice daily as directed (E11.69). 1 each 0   Calcium Carbonate Antacid (TUMS CHEWY BITES PO) Take 2 tablets by mouth at bedtime as needed (reflux).     carvedilol (COREG) 6.25 MG tablet Take 1 tablet (6.25 mg total) by mouth 2 (two) times daily with a meal. 180 tablet 4   Cholecalciferol (VITAMIN D3) 25 MCG (1000 UT) CAPS Take 2 capsules (2,000 Units total) by mouth every evening.     Coenzyme Q10 100 MG capsule Take 1 capsule (100 mg total) by mouth at bedtime.     cyanocobalamin (VITAMIN B12) 1000 MCG tablet Take 1 tablet (1,000 mcg total) by mouth every Monday, Wednesday, and Friday.     diltiazem (CARDIZEM CD) 240 MG 24 hr capsule Take 1 capsule (240 mg total) by mouth daily. 90 capsule 4   furosemide (LASIX) 20 MG tablet Take 1 tablet (20 mg total) by mouth every other day. 45 tablet 3   glucose blood (GLUCOSE METER TEST) test strip Use as instructed to check sugars twice daily as needed E11.69 100 each 12   losartan (COZAAR) 25 MG tablet Take 1 tablet (25 mg total) by mouth daily. 90 tablet 1   metFORMIN (GLUCOPHAGE) 500 MG tablet Take 1 tablet (500 mg total) by mouth 2 (two) times daily with a meal. For diabetes 180 tablet 4   Multiple Vitamins-Minerals (EYE VITAMINS PO) Take 2 tablets by mouth at bedtime.     Omega-3 Fatty Acids  (FISH OIL PO) Take 1 capsule by mouth daily.     omeprazole (PRILOSEC) 40 MG capsule Take 1 capsule (40 mg total) by mouth daily as needed. 90 capsule 4   rosuvastatin (CRESTOR) 5 MG tablet Take 1 tablet (5 mg total) by mouth daily. 90 tablet 4   Semaglutide,0.25 or 0.5MG /DOS, (OZEMPIC, 0.25 OR 0.5 MG/DOSE,) 2 MG/3ML SOPN Inject 0.5 mg as directed once a week.     Sodium Sulfate-Mag Sulfate-KCl (SUTAB) [phone] MG TABS Use as directed for colonoscopy. MANUFACTURER CODES!! BIN: [id number] PCN: CN GROUP: [id number] MEMBER ID: [id number];[employer] AS SECONDARY INSURANCE ;NO PRIOR AUTHORIZATION 24 tablet 0   predniSONE (DELTASONE) 20 MG tablet Take two tablets daily for 4 days followed by one tablet daily for 4 days (Patient not taking: Reported on 03/04/2024) 12 tablet 0   No current facility-administered medications on file prior to visit.    Allergies  Allergen Reactions   Lisinopril Hives and Swelling    angioedema - lip swelling, hives    Past Medical History:  Diagnosis Date   Acute pulmonary edema (HCC) 08/13/2022   Atrial fibrillation with RVR (HCC) 08/13/2022   Cancer (HCC)    skin cancer removed from nose   Chronic diastolic CHF (congestive heart failure) (  HCC)    COVID-19 virus infection 11/23/2021   Diabetes mellitus without complication (HCC)    Diabetic cataract of both eyes (HCC) 07/12/2020   Dyslipidemia associated with type 2 diabetes mellitus (HCC) 07/15/2009   Essential hypertension 06/23/2008   GERD (gastroesophageal reflux disease)    occasional   Gout    ?due to L great toe pain, saw Dr Darrelyn Hillock   History of chicken pox    Hyperlipidemia    Hypertension    Legg-Perthes disease    Right hip   Nasal septal deviation    monitoring - [facility]   OA (osteoarthritis) of hip 03/07/2017   Pleural effusion 08/13/2022   Pneumonia due to COVID-19 virus 01/21/2020   Sepsis (HCC)    In March 2018   Type 2 diabetes mellitus with diabetic neuropathy, unspecified (HCC) 08/31/2016    SPEP normal 08/2016  Undergoing B12 replacement.       Past Surgical History:  Procedure Laterality Date   COLONOSCOPY  03/2011   2 tubular adenomas, rec rpt 5 yrs   COLONOSCOPY  01/2018   diverticulosis, rpt 5 yrs Russella Dar)   HERNIA REPAIR     double hernia   LUMBAR FUSION  04/2013   transforaminal interbody fusion L4/5 (Dumonski) for R L5 radiculopathy, L4/5 spondylolisthesis, and L4/5 HNP with compression of spinal cord   TONSILLECTOMY     TOTAL HIP ARTHROPLASTY Right 03/07/2017   Procedure: RIGHT TOTAL HIP ARTHROPLASTY ANTERIOR APPROACH;  Surgeon: Ollen Gross, MD;  Location: [facility];  Service: Orthopedics;  Laterality: Right;   VASECTOMY     WISDOM TOOTH EXTRACTION      Family History  Problem Relation Age of Onset   Breast cancer Mother        with recurrence   Alzheimer's disease Father    Cervical cancer Sister 59       metastatic to lungs and brain   Stroke Maternal Grandfather    Cancer Maternal Aunt        ?   Hypertension Maternal Uncle    Stroke Maternal Uncle    Coronary artery disease Paternal Uncle        MI   Cancer Other        niece-2 types    Social History   Socioeconomic History   Marital status: Married    Spouse name: Not on file   Number of children: Not on file   Years of education: Not on file   Highest education level: Not on file  Occupational History   Occupation: part-time  Tobacco Use   Smoking status: Never   Smokeless tobacco: Never  Vaping Use   Vaping status: Never Used  Substance and Sexual Activity   Alcohol use: No   Drug use: No   Sexual activity: Yes  Other Topics Concern   Not on file  Social History Narrative   Caffeine: few cups/day   Lives with wife, no pets   Occupation-Structural Teacher, English as a foreign language, "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  No regular exercise.   Diet: "I love my ice cream. I can't live forever", some water   Social Drivers of Health   Financial  Resource Strain: Low Risk  (08/02/2023)   Overall Financial Resource Strain (CARDIA)    Difficulty of Paying Living Expenses: Not hard at all  Food Insecurity: No Food Insecurity (08/02/2023)   Hunger Vital Sign    Worried About Running Out of Food in the Last  Year: Never true    Ran Out of Food in the Last Year: Never true  Transportation Needs: No Transportation Needs (08/02/2023)   PRAPARE - Administrator, Civil Service (Medical): No    Lack of Transportation (Non-Medical): No  Physical Activity: Inactive (08/02/2023)   Exercise Vital Sign    Days of Exercise per Week: 0 days    Minutes of Exercise per Session: 0 min  Stress: No Stress Concern Present (08/02/2023)   [employer] of Occupational Health - Occupational Stress Questionnaire    Feeling of Stress : Not at all  Social Connections: Socially Integrated (08/02/2023)   Social Connection and Isolation Panel [NHANES]    Frequency of Communication with Friends and Family: More than three times a week    Frequency of Social Gatherings with Friends and Family: More than three times a week    Attends Religious Services: More than 4 times per year    Active Member of [employer] or Organizations: Yes    Attends Engineer, structural: More than 4 times per year    Marital Status: Married  Catering manager Violence: Not At Risk (08/02/2023)   Humiliation, Afraid, Rape, and Kick questionnaire    Fear of Current or Ex-Partner: No    Emotionally Abused: No    Physically Abused: No    Sexually Abused: No   Review of Systems No fever No URI symptoms     Objective:   Physical Exam HENT:     Ears:     Comments: Cerumen impaction in canals bilaterally           Assessment & Plan:

## 2024-03-24 ENCOUNTER — Telehealth: Payer: Self-pay | Admitting: Family Medicine

## 2024-03-24 DIAGNOSIS — I4891 Unspecified atrial fibrillation: Secondary | ICD-10-CM

## 2024-03-24 NOTE — Telephone Encounter (Signed)
 Rtn pt's/pt's wife's, Brenda's call (see other 03/24/24 phn note).

## 2024-03-24 NOTE — Telephone Encounter (Signed)
 Pt's wife, Cornelius Dill, called back stating pt needs 90-day rxs.

## 2024-03-24 NOTE — Telephone Encounter (Signed)
 Copied from CRM [id number]. Topic: General - Other >> Mar 24, 2024  2:31 PM Everlene Hobby D wrote: Patient's wife Cornelius Dill wants to speak with nurse Stana Ear

## 2024-03-24 NOTE — Telephone Encounter (Unsigned)
 Copied from CRM [id number]. Topic: Clinical - Medication Question >> Mar 24, 2024  1:22 PM Rosamond Comes wrote: Reason for CRM: patient wife Cornelius Dill calling in. New insurance [facility]  Cornelius Dill would like to have provider review ALL medication and sent to [facility].  If it meets with Dr Blima Bureau approval to pharmacy change.     Cornelius Dill phone [phone] and home [phone]

## 2024-03-24 NOTE — Telephone Encounter (Signed)
 Spoke with pt/pt's wife, Cornelius Dill (on dpr), asking for clarification of message. States pt has changed insurance companies and now has to use [facility] order and local [facility] pharmacies. We updated pt's pharmacy list. They are asking if pt's Eliquis can now come through mail order pharmacy or need to stay with local pharmacy. Either way, a new rx needs to be sent to whichever pharmacy Dr Crissie Dome is ok with.

## 2024-03-25 MED ORDER — APIXABAN 5 MG PO TABS: 5.0000 mg | ORAL_TABLET | Freq: Two times a day (BID) | ORAL | 2 refills | Status: DC

## 2024-03-25 NOTE — Telephone Encounter (Signed)
 Eliquis 90d supply sent to pharmacy. Does he need all other meds sent in as well?

## 2024-03-26 NOTE — Telephone Encounter (Signed)
 Spoke with pt notifying him rx sent in. Pt expresses his thanks but says he denies needing any other meds sent in at this time.

## 2024-04-01 ENCOUNTER — Telehealth: Payer: Self-pay

## 2024-04-01 ENCOUNTER — Encounter: Payer: Self-pay | Admitting: Certified Registered Nurse Anesthetist

## 2024-04-01 ENCOUNTER — Telehealth: Payer: Self-pay | Admitting: Family Medicine

## 2024-04-01 DIAGNOSIS — K219 Gastro-esophageal reflux disease without esophagitis: Secondary | ICD-10-CM

## 2024-04-01 NOTE — Telephone Encounter (Unsigned)
 Copied from CRM [id number]. Topic: Clinical - Medication Question >> Apr 01, 2024 12:15 PM Devin Huffman P wrote: Reason for CRM: Pt wife want to speak to nurse to clarify if its ok to go through mail service for pt medication

## 2024-04-01 NOTE — Telephone Encounter (Signed)
 Copied from CRM [id number]. Topic: Clinical - Prescription Issue >> Mar 31, 2024  4:47 PM Danae Duncans wrote: Reason for CRM: PT wife called to advise PT would like all medications sent to Mail order as Pt had declined previously.

## 2024-04-02 MED ORDER — LOSARTAN POTASSIUM 25 MG PO TABS: 25.0000 mg | ORAL_TABLET | Freq: Every day | ORAL | 3 refills | Status: DC

## 2024-04-02 MED ORDER — FUROSEMIDE 20 MG PO TABS: 20.0000 mg | ORAL_TABLET | ORAL | 3 refills | Status: DC

## 2024-04-02 MED ORDER — DILTIAZEM HCL ER COATED BEADS 240 MG PO CP24: 240.0000 mg | ORAL_CAPSULE | Freq: Every day | ORAL | 3 refills | Status: DC

## 2024-04-02 MED ORDER — ROSUVASTATIN CALCIUM 5 MG PO TABS: 5.0000 mg | ORAL_TABLET | Freq: Every day | ORAL | 3 refills | Status: DC

## 2024-04-02 MED ORDER — OMEPRAZOLE 40 MG PO CPDR: 40.0000 mg | DELAYED_RELEASE_CAPSULE | Freq: Every day | ORAL | 3 refills | Status: DC | PRN

## 2024-04-02 MED ORDER — METFORMIN HCL 500 MG PO TABS: 500.0000 mg | ORAL_TABLET | Freq: Two times a day (BID) | ORAL | 3 refills | Status: DC

## 2024-04-02 MED ORDER — CARVEDILOL 6.25 MG PO TABS: 6.2500 mg | ORAL_TABLET | Freq: Two times a day (BID) | ORAL | 3 refills | Status: DC

## 2024-04-02 NOTE — Addendum Note (Signed)
 Addended by: Claire Crick on: 04/02/2024 01:18 PM   Modules accepted: Orders

## 2024-04-02 NOTE — Telephone Encounter (Signed)
 Meds sent to [facility] [employer] service. Does he also need ozempic ?

## 2024-04-03 NOTE — Telephone Encounter (Signed)
 Spoke with pt relaying Dr Ocie Belt message. I mentioned Ozempic  and pt reminded me he gets that via Novo Nordisk PAP delivered to our office. Pt expresses his thanks.

## 2024-04-07 ENCOUNTER — Ambulatory Visit: Admitting: Gastroenterology

## 2024-04-07 ENCOUNTER — Encounter: Payer: Self-pay | Admitting: Gastroenterology

## 2024-04-07 VITALS — BP 111/59 | HR 60 | Temp 97.7°F | Resp 14 | Ht 71.0 in | Wt 227.0 lb

## 2024-04-07 DIAGNOSIS — R195 Other fecal abnormalities: Secondary | ICD-10-CM

## 2024-04-07 DIAGNOSIS — K635 Polyp of colon: Secondary | ICD-10-CM | POA: Diagnosis not present

## 2024-04-07 DIAGNOSIS — Z1211 Encounter for screening for malignant neoplasm of colon: Secondary | ICD-10-CM

## 2024-04-07 DIAGNOSIS — D120 Benign neoplasm of cecum: Secondary | ICD-10-CM

## 2024-04-07 DIAGNOSIS — D122 Benign neoplasm of ascending colon: Secondary | ICD-10-CM | POA: Diagnosis not present

## 2024-04-07 DIAGNOSIS — D123 Benign neoplasm of transverse colon: Secondary | ICD-10-CM

## 2024-04-07 DIAGNOSIS — K648 Other hemorrhoids: Secondary | ICD-10-CM | POA: Diagnosis not present

## 2024-04-07 DIAGNOSIS — K5730 Diverticulosis of large intestine without perforation or abscess without bleeding: Secondary | ICD-10-CM | POA: Diagnosis not present

## 2024-04-07 MED ORDER — SODIUM CHLORIDE 0.9 % IV SOLN: 500.0000 mL | Freq: Once | INTRAVENOUS | Status: DC

## 2024-04-07 NOTE — Patient Instructions (Signed)
 You may resume your Eliquis  tomorrow 04/08/24 as normal  YOU HAD AN ENDOSCOPIC PROCEDURE TODAY AT THE [facility]:   Refer to the procedure report that was given to you for any specific questions about what was found during the examination.  If the procedure report does not answer your questions, please call your gastroenterologist to clarify.  If you requested that your care partner not be given the details of your procedure findings, then the procedure report has been included in a sealed envelope for you to review at your convenience later.  YOU SHOULD EXPECT: Some feelings of bloating in the abdomen. Passage of more gas than usual.  Walking can help get rid of the air that was put into your GI tract during the procedure and reduce the bloating. If you had a lower endoscopy (such as a colonoscopy or flexible sigmoidoscopy) you may notice spotting of blood in your stool or on the toilet paper. If you underwent a bowel prep for your procedure, you may not have a normal bowel movement for a few days.  Please Note:  You might notice some irritation and congestion in your nose or some drainage.  This is from the oxygen  used during your procedure.  There is no need for concern and it should clear up in a day or so.  SYMPTOMS TO REPORT IMMEDIATELY:  Following lower endoscopy (colonoscopy or flexible sigmoidoscopy):  Excessive amounts of blood in the stool  Significant tenderness or worsening of abdominal pains  Swelling of the abdomen that is new, acute  Fever of 100F or higher  For urgent or emergent issues, a gastroenterologist can be reached at any hour by calling [phone]. Do not use MyChart messaging for urgent concerns.    DIET:  We do recommend a small meal at first, but then you may proceed to your regular diet.  Drink plenty of fluids but you should avoid alcoholic beverages for 24 hours.  ACTIVITY:  You should plan to take it easy for the rest of today and you should  NOT DRIVE or use heavy machinery until tomorrow (because of the sedation medicines used during the test).    FOLLOW UP: Our staff will call the number listed on your records the next business day following your procedure.  We will call around 7:15- 8:00 am to check on you and address any questions or concerns that you may have regarding the information given to you following your procedure. If we do not reach you, we will leave a message.     If any biopsies were taken you will be contacted by phone or by letter within the next 1-3 weeks.  Please call us  at [phone] if you have not heard about the biopsies in 3 weeks.    SIGNATURES/CONFIDENTIALITY: You and/or your care partner have signed paperwork which will be entered into your electronic medical record.  These signatures attest to the fact that that the information above on your After Visit Summary has been reviewed and is understood.  Full responsibility of the confidentiality of this discharge information lies with you and/or your care-partner.

## 2024-04-07 NOTE — Progress Notes (Signed)
 [facility] History and Physical   Primary Care Physician:  Claire Crick, MD   Reason for Procedure:   Positive FIT  Plan:    Colonoscopy     HPI: Devin Huffman is a 78 y.o. male  here for colonoscopy to evaluate (+) FIT - last exam 2019 with Dr. Sandrea Cruel.   Patient denies any bowel symptoms at this time. No family history of colon cancer known. Otherwise feels well without any cardiopulmonary symptoms. Eliquis  held for 2 days for this exam.  I have discussed risks / benefits of anesthesia and endoscopic procedure with Devin Huffman and they wish to proceed with the exams as outlined today.    Past Medical History:  Diagnosis Date   Acute pulmonary edema (HCC) 08/13/2022   Atrial fibrillation with RVR (HCC) 08/13/2022   Cancer (HCC)    skin cancer removed from nose   Chronic diastolic CHF (congestive heart failure) (HCC)    COVID-19 virus infection 11/23/2021   Diabetes mellitus without complication (HCC)    Diabetic cataract of both eyes (HCC) 07/12/2020   Dyslipidemia associated with type 2 diabetes mellitus (HCC) 07/15/2009   Essential hypertension 06/23/2008   GERD (gastroesophageal reflux disease)    occasional   Gout    ?due to L great toe pain, saw Dr Dante Dyer   History of chicken pox    Hyperlipidemia    Hypertension    Legg-Perthes disease    Right hip   Nasal septal deviation    monitoring - [place]   OA (osteoarthritis) of hip 03/07/2017   Pleural effusion 08/13/2022   Pneumonia due to COVID-19 virus 01/21/2020   Sepsis (HCC)    In March 2018   Type 2 diabetes mellitus with diabetic neuropathy, unspecified (HCC) 08/31/2016   SPEP normal 08/2016  Undergoing B12 replacement.       Past Surgical History:  Procedure Laterality Date   COLONOSCOPY  03/2011   2 tubular adenomas, rec rpt 5 yrs   COLONOSCOPY  01/2018   diverticulosis, rpt 5 yrs Sandrea Cruel)   HERNIA REPAIR     double hernia   LUMBAR FUSION  04/2013   transforaminal interbody  fusion L4/5 (Dumonski) for R L5 radiculopathy, L4/5 spondylolisthesis, and L4/5 HNP with compression of spinal cord   TONSILLECTOMY     TOTAL HIP ARTHROPLASTY Right 03/07/2017   Procedure: RIGHT TOTAL HIP ARTHROPLASTY ANTERIOR APPROACH;  Surgeon: Liliane Rei, MD;  Location: [facility];  Service: Orthopedics;  Laterality: Right;   VASECTOMY     WISDOM TOOTH EXTRACTION      Prior to Admission medications   Medication Sig Start Date End Date Taking? Authorizing Provider  Ascorbic Acid (VITAMIN C ) 1000 MG tablet Take 1,000 mg by mouth every evening.    Yes [provider]  blood glucose meter kit and supplies KIT Dispense based on patient and insurance preference. Use to check sugars up to twice daily as directed (E11.69). 06/28/21  Yes Claire Crick, MD  carvedilol  (COREG ) 6.25 MG tablet Take 1 tablet (6.25 mg total) by mouth 2 (two) times daily with a meal. 04/02/24  Yes Claire Crick, MD  Cholecalciferol (VITAMIN D3) 25 MCG (1000 UT) CAPS Take 2 capsules (2,000 Units total) by mouth every evening. 05/31/23  Yes Claire Crick, MD  Coenzyme Q10 100 MG capsule Take 1 capsule (100 mg total) by mouth at bedtime. 11/13/23  Yes Claire Crick, MD  cyanocobalamin  (VITAMIN B12) 1000 MCG tablet Take 1 tablet (1,000 mcg total) by mouth every Monday,  Wednesday, and Friday. 11/14/23  Yes Claire Crick, MD  diltiazem  (CARDIZEM  CD) 240 MG 24 hr capsule Take 1 capsule (240 mg total) by mouth daily. 04/02/24  Yes Claire Crick, MD  glucose blood (GLUCOSE METER TEST) test strip Use as instructed to check sugars twice daily as needed E11.69 06/28/21  Yes Claire Crick, MD  losartan  (COZAAR ) 25 MG tablet Take 1 tablet (25 mg total) by mouth daily. 04/02/24  Yes Claire Crick, MD  metFORMIN  (GLUCOPHAGE ) 500 MG tablet Take 1 tablet (500 mg total) by mouth 2 (two) times daily with a meal. For diabetes 04/02/24 03/28/25 Yes Claire Crick, MD  Multiple Vitamins-Minerals (EYE VITAMINS PO)  Take 2 tablets by mouth at bedtime.   Yes [provider]  Omega-3 Fatty Acids (FISH OIL  PO) Take 1 capsule by mouth daily.   Yes [provider]  rosuvastatin  (CRESTOR ) 5 MG tablet Take 1 tablet (5 mg total) by mouth daily. 04/02/24  Yes Claire Crick, MD  apixaban  (ELIQUIS ) 5 MG TABS tablet Take 1 tablet (5 mg total) by mouth 2 (two) times daily. 03/25/24   Claire Crick, MD  Calcium  Carbonate Antacid (TUMS CHEWY BITES PO) Take 2 tablets by mouth at bedtime as needed (reflux).    [provider]  furosemide  (LASIX ) 20 MG tablet Take 1 tablet (20 mg total) by mouth every other day. 04/02/24   Claire Crick, MD  omeprazole  (PRILOSEC) 40 MG capsule Take 1 capsule (40 mg total) by mouth daily as needed. 04/02/24   Claire Crick, MD  predniSONE  (DELTASONE ) 20 MG tablet Take two tablets daily for 4 days followed by one tablet daily for 4 days Patient not taking: Reported on 04/07/2024 03/03/24   Claire Crick, MD  Semaglutide ,0.25 or 0.5MG /DOS, (OZEMPIC , 0.25 OR 0.5 MG/DOSE,) 2 MG/3ML SOPN Inject 0.5 mg as directed once a week. 11/13/23   Claire Crick, MD    Current Outpatient Medications  Medication Sig Dispense Refill   Ascorbic Acid (VITAMIN C ) 1000 MG tablet Take 1,000 mg by mouth every evening.      blood glucose meter kit and supplies KIT Dispense based on patient and insurance preference. Use to check sugars up to twice daily as directed (E11.69). 1 each 0   carvedilol  (COREG ) 6.25 MG tablet Take 1 tablet (6.25 mg total) by mouth 2 (two) times daily with a meal. 180 tablet 3   Cholecalciferol (VITAMIN D3) 25 MCG (1000 UT) CAPS Take 2 capsules (2,000 Units total) by mouth every evening.     Coenzyme Q10 100 MG capsule Take 1 capsule (100 mg total) by mouth at bedtime.     cyanocobalamin  (VITAMIN B12) 1000 MCG tablet Take 1 tablet (1,000 mcg total) by mouth every Monday, Wednesday, and Friday.     diltiazem  (CARDIZEM  CD) 240 MG 24 hr capsule Take 1  capsule (240 mg total) by mouth daily. 90 capsule 3   glucose blood (GLUCOSE METER TEST) test strip Use as instructed to check sugars twice daily as needed E11.69 100 each 12   losartan  (COZAAR ) 25 MG tablet Take 1 tablet (25 mg total) by mouth daily. 90 tablet 3   metFORMIN  (GLUCOPHAGE ) 500 MG tablet Take 1 tablet (500 mg total) by mouth 2 (two) times daily with a meal. For diabetes 180 tablet 3   Multiple Vitamins-Minerals (EYE VITAMINS PO) Take 2 tablets by mouth at bedtime.     Omega-3 Fatty Acids (FISH OIL  PO) Take 1 capsule by mouth daily.     rosuvastatin  (CRESTOR ) 5  MG tablet Take 1 tablet (5 mg total) by mouth daily. 90 tablet 3   apixaban  (ELIQUIS ) 5 MG TABS tablet Take 1 tablet (5 mg total) by mouth 2 (two) times daily. 180 tablet 2   Calcium  Carbonate Antacid (TUMS CHEWY BITES PO) Take 2 tablets by mouth at bedtime as needed (reflux).     furosemide  (LASIX ) 20 MG tablet Take 1 tablet (20 mg total) by mouth every other day. 45 tablet 3   omeprazole  (PRILOSEC) 40 MG capsule Take 1 capsule (40 mg total) by mouth daily as needed. 90 capsule 3   predniSONE  (DELTASONE ) 20 MG tablet Take two tablets daily for 4 days followed by one tablet daily for 4 days (Patient not taking: Reported on 04/07/2024) 12 tablet 0   Semaglutide ,0.25 or 0.5MG /DOS, (OZEMPIC , 0.25 OR 0.5 MG/DOSE,) 2 MG/3ML SOPN Inject 0.5 mg as directed once a week.     Current Facility-Administered Medications  Medication Dose Route Frequency Provider Last Rate Last Admin   0.9 %  sodium chloride  infusion  500 mL Intravenous Once Jeriel Vivanco, Lendon Queen, MD        Allergies as of 04/07/2024 - Review Complete 04/07/2024  Allergen Reaction Noted   Lisinopril  Hives and Swelling 02/22/2012    Family History  Problem Relation Age of Onset   Breast cancer Mother        with recurrence   Alzheimer's disease Father    Cervical cancer Sister 81       metastatic to lungs and brain   Cancer Maternal Aunt        ?   Hypertension  Maternal Uncle    Stroke Maternal Uncle    Coronary artery disease Paternal Uncle        MI   Stroke Maternal Grandfather    Cancer Other        niece-2 types   Colon cancer Neg Hx    Esophageal cancer Neg Hx    Rectal cancer Neg Hx    Stomach cancer Neg Hx     Social History   Socioeconomic History   Marital status: Married    Spouse name: Not on file   Number of children: Not on file   Years of education: Not on file   Highest education level: Not on file  Occupational History   Occupation: part-time  Tobacco Use   Smoking status: Never   Smokeless tobacco: Never  Vaping Use   Vaping status: Never Used  Substance and Sexual Activity   Alcohol use: No   Drug use: No   Sexual activity: Yes  Other Topics Concern   Not on file  Social History Narrative   Caffeine: few cups/day   Lives with wife, no pets   Occupation-Structural Teacher, English as a foreign language, "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  No regular exercise.   Diet: "I love my ice cream. I can't live forever", some water   Social Drivers of Health   Financial Resource Strain: Low Risk  (08/02/2023)   Overall Financial Resource Strain (CARDIA)    Difficulty of Paying Living Expenses: Not hard at all  Food Insecurity: No Food Insecurity (08/02/2023)   Hunger Vital Sign    Worried About Running Out of Food in the Last Year: Never true    Ran Out of Food in the Last Year: Never true  Transportation Needs: No Transportation Needs (08/02/2023)   PRAPARE - Administrator, Civil Service (Medical): No  Lack of Transportation (Non-Medical): No  Physical Activity: Inactive (08/02/2023)   Exercise Vital Sign    Days of Exercise per Week: 0 days    Minutes of Exercise per Session: 0 min  Stress: No Stress Concern Present (08/02/2023)   [employer] of Occupational Health - Occupational Stress Questionnaire    Feeling of Stress : Not at all  Social Connections:  Socially Integrated (08/02/2023)   Social Connection and Isolation Panel [NHANES]    Frequency of Communication with Friends and Family: More than three times a week    Frequency of Social Gatherings with Friends and Family: More than three times a week    Attends Religious Services: More than 4 times per year    Active Member of [employer] or Organizations: Yes    Attends Engineer, structural: More than 4 times per year    Marital Status: Married  Catering manager Violence: Not At Risk (08/02/2023)   Humiliation, Afraid, Rape, and Kick questionnaire    Fear of Current or Ex-Partner: No    Emotionally Abused: No    Physically Abused: No    Sexually Abused: No    Review of Systems: All other review of systems negative except as mentioned in the HPI.  Physical Exam: Vital signs BP 129/72   Pulse 70   Temp 97.7 F (36.5 C) (Temporal)   Ht 5\' 11"  (1.803 m)   Wt 227 lb (103 kg)   SpO2 96%   BMI 31.66 kg/m   General:   Alert,  Well-developed, pleasant and cooperative in NAD Lungs:  Clear throughout to auscultation.   Heart:  Regular rate and rhythm Abdomen:  Soft, nontender and nondistended.   Neuro/Psych:  Alert and cooperative. Normal mood and affect. A and O x 3  Christi Coward, MD [facility]

## 2024-04-07 NOTE — Op Note (Signed)
 [facility] Patient Name: Devin Huffman Procedure Date: 04/07/2024 1:44 PM MRN: [id number] Endoscopist: Landon Pinion P. General Kenner , MD, [id number] Age: 78 Referring MD:  Date of Birth: 11/13/1946 Gender: Male Account #: [id number] Procedure:                Colonoscopy Indications:              Positive fecal immunochemical test in the setting                            of Eliquis  - last exam 2019 - Dr. Sandrea Cruel Medicines:                Monitored Anesthesia Care Procedure:                Pre-Anesthesia Assessment:                           - Prior to the procedure, a History and Physical                            was performed, and patient medications and                            allergies were reviewed. The patient's tolerance of                            previous anesthesia was also reviewed. The risks                            and benefits of the procedure and the sedation                            options and risks were discussed with the patient.                            All questions were answered, and informed consent                            was obtained. Prior Anticoagulants: The patient has                            taken Eliquis  (apixaban ), last dose was 2 days                            prior to procedure. ASA Grade Assessment: II - A                            patient with mild systemic disease. After reviewing                            the risks and benefits, the patient was deemed in                            satisfactory condition to undergo the procedure.  After obtaining informed consent, the colonoscope                            was passed under direct vision. Throughout the                            procedure, the patient's blood pressure, pulse, and                            oxygen  saturations were monitored continuously. The                            Olympus CF-HQ190L ([id number]) Colonoscope was                             introduced through the anus and advanced to the the                            cecum, identified by appendiceal orifice and                            ileocecal valve. The colonoscopy was performed                            without difficulty. The patient tolerated the                            procedure well. The quality of the bowel                            preparation was adequate. The ileocecal valve,                            appendiceal orifice, and rectum were photographed. Scope In: 1:49:43 PM Scope Out: 2:06:56 PM Scope Withdrawal Time: 0 hours 11 minutes 38 seconds  Total Procedure Duration: 0 hours 17 minutes 13 seconds  Findings:                 The perianal and digital rectal examinations were                            normal.                           A 5 mm polyp was found in the cecum. The polyp was                            flat. The polyp was removed with a cold snare.                            Resection and retrieval were complete.                           Two sessile polyps were found in the ascending  colon. The polyps were 3 mm in size. These polyps                            were removed with a cold snare. Resection and                            retrieval were complete.                           Three sessile polyps were found in the hepatic                            flexure. The polyps were 3 to 4 mm in size. These                            polyps were removed with a cold snare. Resection                            and retrieval were complete.                           A 3 mm polyp was found in the transverse colon. The                            polyp was sessile. The polyp was removed with a                            cold snare. Resection and retrieval were complete.                            Of note I thought a photo of the polyp was taken,                            but it was not.                           A few  small-mouthed diverticula were found in the                            transverse colon and left colon.                           Internal hemorrhoids were found during retroflexion.                           The exam was otherwise without abnormality. Complications:            No immediate complications. Estimated blood loss:                            Minimal. Estimated Blood Loss:     Estimated blood loss was minimal. Impression:               - One 5 mm polyp in the  cecum, removed with a cold                            snare. Resected and retrieved.                           - Two 3 mm polyps in the ascending colon, removed                            with a cold snare. Resected and retrieved.                           - Three 3 to 4 mm polyps at the hepatic flexure,                            removed with a cold snare. Resected and retrieved.                           - One 3 mm polyp in the transverse colon, removed                            with a cold snare. Resected and retrieved.                           - Diverticulosis in the transverse colon and in the                            left colon.                           - Internal hemorrhoids.                           - The examination was otherwise normal. Recommendation:           - Patient has a contact number available for                            emergencies. The signs and symptoms of potential                            delayed complications were discussed with the                            patient. Return to normal activities tomorrow.                            Written discharge instructions were provided to the                            patient.                           - Resume previous diet.                           -  Continue present medications.                           - Resume Eliquis  tomorrow.                           - Await pathology results.                           - Likely no further colonoscopy  exams are                            recommended given the patient's age and no high                            risk findings on this exam Lendon Queen. Taylr Meuth, MD 04/07/2024 2:13:00 PM This report has been signed electronically.

## 2024-04-07 NOTE — Progress Notes (Signed)
 Report to PACU, RN, vss, BBS= Clear.

## 2024-04-07 NOTE — Progress Notes (Signed)
 Called to room to assist during endoscopic procedure.  Patient ID and intended procedure confirmed with present staff. Received instructions for my participation in the procedure from the performing physician.

## 2024-04-08 ENCOUNTER — Telehealth: Payer: Self-pay

## 2024-04-08 NOTE — Telephone Encounter (Signed)
  Follow up Call-     04/07/2024    1:10 PM  Call back number  Post procedure Call Back phone  # [phone]  Permission to leave phone message Yes     Patient questions:  Do you have a fever, pain , or abdominal swelling? No. Pain Score  0 *  Have you tolerated food without any problems? Yes.    Have you been able to return to your normal activities? Yes.    Do you have any questions about your discharge instructions: Diet   No. Medications  No. Follow up visit  No.  Do you have questions or concerns about your Care? No.  Actions: * If pain score is 4 or above: No action needed, pain <4.

## 2024-04-10 HISTORY — PX: COLONOSCOPY: SHX174

## 2024-04-10 LAB — SURGICAL PATHOLOGY

## 2024-04-11 ENCOUNTER — Encounter: Payer: Self-pay | Admitting: Gastroenterology

## 2024-04-18 ENCOUNTER — Telehealth: Payer: Self-pay

## 2024-04-18 NOTE — Telephone Encounter (Signed)
 Received pt's med shipment of Ozempic  0.25 mg, 0.5 mg (4 boxes total).    Spoke with pt notifying him of Ozempic  shipment. Pt verbalizes understanding and will pick up 04/21/24.   [Placed Ozempic  (4 boxes total) in 2nd refrigerator.]

## 2024-05-06 DIAGNOSIS — H1045 Other chronic allergic conjunctivitis: Secondary | ICD-10-CM | POA: Diagnosis not present

## 2024-05-06 DIAGNOSIS — R7309 Other abnormal glucose: Secondary | ICD-10-CM | POA: Diagnosis not present

## 2024-05-06 DIAGNOSIS — H524 Presbyopia: Secondary | ICD-10-CM | POA: Diagnosis not present

## 2024-05-06 DIAGNOSIS — H04123 Dry eye syndrome of bilateral lacrimal glands: Secondary | ICD-10-CM | POA: Diagnosis not present

## 2024-05-06 DIAGNOSIS — H25813 Combined forms of age-related cataract, bilateral: Secondary | ICD-10-CM | POA: Diagnosis not present

## 2024-05-06 LAB — HM DIABETES EYE EXAM

## 2024-06-18 DIAGNOSIS — H2512 Age-related nuclear cataract, left eye: Secondary | ICD-10-CM | POA: Diagnosis not present

## 2024-06-18 DIAGNOSIS — H25813 Combined forms of age-related cataract, bilateral: Secondary | ICD-10-CM | POA: Diagnosis not present

## 2024-07-08 DIAGNOSIS — H25811 Combined forms of age-related cataract, right eye: Secondary | ICD-10-CM | POA: Diagnosis not present

## 2024-07-23 DIAGNOSIS — H2511 Age-related nuclear cataract, right eye: Secondary | ICD-10-CM | POA: Diagnosis not present

## 2024-07-23 DIAGNOSIS — H25811 Combined forms of age-related cataract, right eye: Secondary | ICD-10-CM | POA: Diagnosis not present

## 2024-07-23 LAB — HM DIABETES EYE EXAM

## 2024-07-25 ENCOUNTER — Encounter: Payer: Self-pay | Admitting: Family Medicine

## 2024-08-01 ENCOUNTER — Encounter: Payer: Self-pay | Admitting: Family Medicine

## 2024-08-12 ENCOUNTER — Ambulatory Visit (INDEPENDENT_AMBULATORY_CARE_PROVIDER_SITE_OTHER): Payer: Medicare HMO

## 2024-08-12 VITALS — Ht 71.0 in | Wt 227.0 lb

## 2024-08-12 DIAGNOSIS — Z0000 Encounter for general adult medical examination without abnormal findings: Secondary | ICD-10-CM

## 2024-08-12 NOTE — Patient Instructions (Signed)
 Devin Huffman , Thank you for taking time out of your busy schedule to complete your Annual Wellness Visit with me. I enjoyed our conversation and look forward to speaking with you again next year. I, as well as your care team,  appreciate your ongoing commitment to your health goals. Please review the following plan we discussed and let me know if I can assist you in the future. Your Game plan/ To Do List    Referrals: If you haven't heard from the office you've been referred to, please reach out to them at the phone provided.   Follow up Visits: We will see or speak with you next year for your Next Medicare AWV with our clinical staff Have you seen your provider in the last 6 months (3 months if uncontrolled diabetes)? Yes  Clinician Recommendations:  Aim for 30 minutes of exercise or brisk walking, 6-8 glasses of water, and 5 servings of fruits and vegetables each day.       This is a list of the screenings recommended for you:  Health Maintenance  Topic Date Due   Yearly kidney health urinalysis for diabetes  Never done   COVID-19 Vaccine (3 - Pfizer risk series) 06/08/2020   Hemoglobin A1C  05/05/2024   Complete foot exam   05/28/2024   Flu Shot  07/11/2024   Yearly kidney function blood test for diabetes  11/26/2024   Eye exam for diabetics  07/23/2025   Medicare Annual Wellness Visit  08/12/2025   Pneumococcal Vaccine for age over 40  Completed   Hepatitis C Screening  Completed   Zoster (Shingles) Vaccine  Completed   HPV Vaccine  Aged Out   Meningitis B Vaccine  Aged Out   DTaP/Tdap/Td vaccine  Discontinued   Colon Cancer Screening  Discontinued    Advanced directives: (Declined) Advance directive discussed with you today. Even though you declined this today, please call our office should you change your mind, and we can give you the proper paperwork for you to fill out. Advance Care Planning is important because it:  [x]  Makes sure you receive the medical care that is  consistent with your values, goals, and preferences  [x]  It provides guidance to your family and loved ones and reduces their decisional burden about whether or not they are making the right decisions based on your wishes.  Follow the link provided in your after visit summary or read over the paperwork we have mailed to you to help you started getting your Advance Directives in place. If you need assistance in completing these, please reach out to us  so that we can help you!

## 2024-08-12 NOTE — Progress Notes (Signed)
 Subjective:   RONDALE NIES is a 78 y.o. who presents for a Medicare Wellness preventive visit.  As a reminder, Annual Wellness Visits don't include a physical exam, and some assessments may be limited, especially if this visit is performed virtually. We may recommend an in-person follow-up visit with your provider if needed.  Visit Complete: Virtual I connected with  Arley JAYSON Lyme on 08/12/24 by a audio enabled telemedicine application and verified that I am speaking with the correct person using two identifiers.  Patient Location: Home  Provider Location: Office/Clinic  I discussed the limitations of evaluation and management by telemedicine. The patient expressed understanding and agreed to proceed.  Vital Signs: Because this visit was a virtual/telehealth visit, some criteria may be missing or patient reported. Any vitals not documented were not able to be obtained and vitals that have been documented are patient reported.  VideoDeclined- This patient declined Librarian, academic. Therefore the visit was completed with audio only.  Persons Participating in Visit: Patient.  AWV Questionnaire: No: Patient Medicare AWV questionnaire was not completed prior to this visit.  Cardiac Risk Factors include: advanced age (>33men, >58 women);diabetes mellitus;dyslipidemia;hypertension;male gender;obesity (BMI >30kg/m2);sedentary lifestyle     Objective:    Today's Vitals   08/12/24 1343  Weight: 227 lb (103 kg)  Height: 5' 11 (1.803 m)   Body mass index is 31.66 kg/m.     08/12/2024    1:54 PM 08/02/2023   10:40 AM 08/13/2022    3:27 PM 08/13/2022   10:05 AM 07/31/2022    1:04 PM 06/02/2022    9:11 AM 07/14/2021    2:51 PM  Advanced Directives  Does Patient Have a Medical Advance Directive? No No  No No No No  Would patient like information on creating a medical advance directive?  No - Patient declined No - Patient declined  No - Patient declined No -  Patient declined No - Patient declined    Current Medications (verified) Outpatient Encounter Medications as of 08/12/2024  Medication Sig   apixaban  (ELIQUIS ) 5 MG TABS tablet Take 1 tablet (5 mg total) by mouth 2 (two) times daily.   Ascorbic Acid (VITAMIN C ) 1000 MG tablet Take 1,000 mg by mouth every evening.    blood glucose meter kit and supplies KIT Dispense based on patient and insurance preference. Use to check sugars up to twice daily as directed (E11.69).   Calcium  Carbonate Antacid (TUMS CHEWY BITES PO) Take 2 tablets by mouth at bedtime as needed (reflux).   carvedilol  (COREG ) 6.25 MG tablet Take 1 tablet (6.25 mg total) by mouth 2 (two) times daily with a meal.   Cholecalciferol (VITAMIN D3) 25 MCG (1000 UT) CAPS Take 2 capsules (2,000 Units total) by mouth every evening.   Coenzyme Q10 100 MG capsule Take 1 capsule (100 mg total) by mouth at bedtime.   cyanocobalamin  (VITAMIN B12) 1000 MCG tablet Take 1 tablet (1,000 mcg total) by mouth every Monday, Wednesday, and Friday.   diltiazem  (CARDIZEM  CD) 240 MG 24 hr capsule Take 1 capsule (240 mg total) by mouth daily.   furosemide  (LASIX ) 20 MG tablet Take 1 tablet (20 mg total) by mouth every other day.   glucose blood (GLUCOSE METER TEST) test strip Use as instructed to check sugars twice daily as needed E11.69   losartan  (COZAAR ) 25 MG tablet Take 1 tablet (25 mg total) by mouth daily.   metFORMIN  (GLUCOPHAGE ) 500 MG tablet Take 1 tablet (500 mg total)  by mouth 2 (two) times daily with a meal. For diabetes   Multiple Vitamins-Minerals (EYE VITAMINS PO) Take 2 tablets by mouth at bedtime.   Omega-3 Fatty Acids (FISH OIL  PO) Take 1 capsule by mouth daily.   omeprazole  (PRILOSEC) 40 MG capsule Take 1 capsule (40 mg total) by mouth daily as needed.   rosuvastatin  (CRESTOR ) 5 MG tablet Take 1 tablet (5 mg total) by mouth daily.   Semaglutide ,0.25 or 0.5MG /DOS, (OZEMPIC , 0.25 OR 0.5 MG/DOSE,) 2 MG/3ML SOPN Inject 0.5 mg as directed once  a week.   predniSONE  (DELTASONE ) 20 MG tablet Take two tablets daily for 4 days followed by one tablet daily for 4 days (Patient not taking: Reported on 08/12/2024)   No facility-administered encounter medications on file as of 08/12/2024.    Allergies (verified) Lisinopril    History: Past Medical History:  Diagnosis Date   Acute pulmonary edema (HCC) 08/13/2022   Atrial fibrillation with RVR (HCC) 08/13/2022   Cancer (HCC)    skin cancer removed from nose   Chronic diastolic CHF (congestive heart failure) (HCC)    COVID-19 virus infection 11/23/2021   Diabetes mellitus without complication (HCC)    Diabetic cataract of both eyes (HCC) 07/12/2020   Dyslipidemia associated with type 2 diabetes mellitus (HCC) 07/15/2009   Essential hypertension 06/23/2008   GERD (gastroesophageal reflux disease)    occasional   Gout    ?due to L great toe pain, saw Dr Heide   History of chicken pox    Hyperlipidemia    Hypertension    Legg-Perthes disease    Right hip   Nasal septal deviation    monitoring - [place]   OA (osteoarthritis) of hip 03/07/2017   Pleural effusion 08/13/2022   Pneumonia due to COVID-19 virus 01/21/2020   Sepsis (HCC)    In March 2018   Type 2 diabetes mellitus with diabetic neuropathy, unspecified (HCC) 08/31/2016   SPEP normal 08/2016  Undergoing B12 replacement.      Past Surgical History:  Procedure Laterality Date   COLONOSCOPY  03/2011   2 tubular adenomas, rec rpt 5 yrs   COLONOSCOPY  01/2018   diverticulosis, rpt 5 yrs [facility])   HERNIA REPAIR     double hernia   LUMBAR FUSION  04/2013   transforaminal interbody fusion L4/5 (Dumonski) for R L5 radiculopathy, L4/5 spondylolisthesis, and L4/5 HNP with compression of spinal cord   TONSILLECTOMY     TOTAL HIP ARTHROPLASTY Right 03/07/2017   Procedure: RIGHT TOTAL HIP ARTHROPLASTY ANTERIOR APPROACH;  Surgeon: Dempsey Moan, MD;  Location: [facility];  Service: Orthopedics;  Laterality: Right;   VASECTOMY      WISDOM TOOTH EXTRACTION     Family History  Problem Relation Age of Onset   Breast cancer Mother        with recurrence   Alzheimer's disease Father    Cervical cancer Sister 46       metastatic to lungs and brain   Cancer Maternal Aunt        ?   Hypertension Maternal Uncle    Stroke Maternal Uncle    Coronary artery disease Paternal Uncle        MI   Stroke Maternal Grandfather    Cancer Other        niece-2 types   Colon cancer Neg Hx    Esophageal cancer Neg Hx    Rectal cancer Neg Hx    Stomach cancer Neg Hx    Social History  Socioeconomic History   Marital status: Married    Spouse name: Not on file   Number of children: Not on file   Years of education: Not on file   Highest education level: Not on file  Occupational History   Occupation: part-time  Tobacco Use   Smoking status: Never   Smokeless tobacco: Never  Vaping Use   Vaping status: Never Used  Substance and Sexual Activity   Alcohol use: No   Drug use: No   Sexual activity: Yes  Other Topics Concern   Not on file  Social History Narrative   Caffeine: few cups/day   Lives with wife, no pets   Occupation-Structural Teacher, English as a foreign language, 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  No regular exercise.   Diet: I love my ice cream. I can't live forever, some water   Social Drivers of Health   Financial Resource Strain: Low Risk  (08/12/2024)   Overall Financial Resource Strain (CARDIA)    Difficulty of Paying Living Expenses: Not hard at all  Food Insecurity: No Food Insecurity (08/12/2024)   Hunger Vital Sign    Worried About Running Out of Food in the Last Year: Never true    Ran Out of Food in the Last Year: Never true  Transportation Needs: No Transportation Needs (08/12/2024)   PRAPARE - Administrator, Civil Service (Medical): No    Lack of Transportation (Non-Medical): No  Physical Activity: Inactive (08/12/2024)   Exercise Vital Sign     Days of Exercise per Week: 0 days    Minutes of Exercise per Session: 0 min  Stress: No Stress Concern Present (08/12/2024)   [employer] of Occupational Health - Occupational Stress Questionnaire    Feeling of Stress: Not at all  Social Connections: Socially Integrated (08/12/2024)   Social Connection and Isolation Panel    Frequency of Communication with Friends and Family: More than three times a week    Frequency of Social Gatherings with Friends and Family: More than three times a week    Attends Religious Services: More than 4 times per year    Active Member of [employer] or Organizations: Yes    Attends Engineer, structural: More than 4 times per year    Marital Status: Married    Tobacco Counseling Counseling given: Not Answered    Clinical Intake:  Pre-visit preparation completed: Yes  Pain : No/denies pain     BMI - recorded: 31.66 Nutritional Status: BMI > 30  Obese Nutritional Risks: None Diabetes: Yes CBG done?: No Did pt. bring in CBG monitor from home?: No  Lab Results  Component Value Date   HGBA1C 6.8 (H) 11/06/2023   HGBA1C 5.9 (A) 05/29/2023   HGBA1C 6.3 (H) 08/14/2022     How often do you need to have someone help you when you read instructions, pamphlets, or other written materials from your doctor or pharmacy?: 1 - Never  Interpreter Needed?: No  Comments: lives with wife Information entered by :: B.Asiyah Pineau,LPN   Activities of Daily Living     08/12/2024    1:54 PM  In your present state of health, do you have any difficulty performing the following activities:  Hearing? 0  Vision? 0  Difficulty concentrating or making decisions? 0  Walking or climbing stairs? 0  Dressing or bathing? 0  Doing errands, shopping? 0  Preparing Food and eating ? N  Using the Toilet? N  In the  past six months, have you accidently leaked urine? N  Do you have problems with loss of bowel control? N  Managing your Medications? N  Managing your  Finances? N  Housekeeping or managing your Housekeeping? N    Patient Care Team: Rilla Baller, MD as PCP - General (Family Medicine) Lavona Agent, MD as PCP - Cardiology (Cardiology) Fate Morna SAILOR, [facility] (Inactive) as Pharmacist (Pharmacist) Pa, [facility]  I have updated your Care Teams any recent Medical Services you may have received from other providers in the past year.     Assessment:   This is a routine wellness examination for Javarie.  Hearing/Vision screen Hearing Screening - Comments:: Patient denies any hearing difficulties.   Vision Screening - Comments:: Pt says their vision is good;readers only Dr  Cleatus   Goals Addressed             This Visit's Progress    COMPLETED: DIET - EAT MORE FRUITS AND VEGETABLES       COMPLETED: healthy food choices       Starting 05/22/2017, I will continue to eat 3 desserts per week.      COMPLETED: Patient Stated       Starting 05/27/2018, I will continue to take medications as prescribed.       COMPLETED: Patient Stated       07/12/2020, I will maintain and continue medications as prescribed.      Patient Stated   On track    08/12/24-, I will maintain and continue medications as prescribed.     Patient Stated       08/12/24-keep mobile and eat ice cream       Depression Screen     08/12/2024    1:51 PM 08/02/2023   10:33 AM 07/10/2023    1:51 PM 04/20/2023   10:59 AM 01/11/2023    2:44 PM 07/31/2022    1:01 PM 06/02/2022    9:11 AM  PHQ 2/9 Scores  PHQ - 2 Score 0 0 0 0 0 0 0  PHQ- 9 Score    2 0 0     Fall Risk     08/12/2024    1:47 PM 08/02/2023   10:36 AM 04/20/2023   10:59 AM 01/11/2023    2:44 PM 08/17/2022   10:10 AM  Fall Risk   Falls in the past year? 0 0 0 0 0  Number falls in past yr: 0 0 0 0 0  Injury with Fall? 0 0 0 0 0  Risk for fall due to : No Fall Risks No Fall Risks No Fall Risks No Fall Risks No Fall Risks  Follow up Falls prevention discussed;Education provided Falls prevention  discussed;Falls evaluation completed Falls evaluation completed Falls evaluation completed Falls evaluation completed      Data saved with a previous flowsheet row definition    MEDICARE RISK AT HOME:  Medicare Risk at Home Any stairs in or around the home?: Yes If so, are there any without handrails?: Yes Home free of loose throw rugs in walkways, pet beds, electrical cords, etc?: Yes Adequate lighting in your home to reduce risk of falls?: Yes Life alert?: No Use of a cane, walker or w/c?: No Grab bars in the bathroom?: No Shower chair or bench in shower?: No Elevated toilet seat or a handicapped toilet?: Yes  TIMED UP AND GO:  Was the test performed?  No  Cognitive Function: 6CIT completed    07/14/2021    2:56  PM 07/12/2020    9:08 AM 05/27/2018    8:12 AM 05/22/2017    1:56 PM  MMSE - Mini Mental State Exam  Not completed: Refused     Orientation to time  5 5 5    Orientation to Place  5 5 5    Registration  3 3 3    Attention/ Calculation  5 0 0   Recall  3 3 3    Language- name 2 objects   0 0   Language- repeat  1 1 1   Language- follow 3 step command   3 3   Language- read & follow direction   0 0   Write a sentence   0 0   Copy design   0 0   Total score   20 20      Data saved with a previous flowsheet row definition        08/12/2024    1:57 PM 08/02/2023   10:43 AM 07/31/2022    1:06 PM  6CIT Screen  What Year? 0 points 0 points 0 points  What month? 0 points 0 points 0 points  What time? 0 points 0 points 0 points  Count back from 20 0 points 0 points 0 points  Months in reverse 0 points 4 points 0 points  Repeat phrase 0 points 6 points 0 points  Total Score 0 points 10 points 0 points    Immunizations Immunization History  Administered Date(s) Administered   Fluad Quad(high Dose 65+) 01/12/2021, 10/09/2022, 10/01/2023   Influenza Split 10/15/2012   Influenza,inj,Quad PF,6+ Mos 11/11/2014, 11/17/2015, 08/31/2016, 11/28/2017, 10/21/2018    PFIZER(Purple Top)SARS-COV-2 Vaccination 04/19/2020, 05/11/2020   Pneumococcal Conjugate-13 11/11/2014   Pneumococcal Polysaccharide-23 10/15/2012   Td 12/12/1996, 03/22/2009, 07/12/2020   Zoster Recombinant(Shingrix) 03/14/2022, 10/09/2022   Zoster, Live 10/13/2013    Screening Tests Health Maintenance  Topic Date Due   Diabetic kidney evaluation - Urine ACR  Never done   COVID-19 Vaccine (3 - Pfizer risk series) 06/08/2020   HEMOGLOBIN A1C  05/05/2024   FOOT EXAM  05/28/2024   INFLUENZA VACCINE  07/11/2024   Diabetic kidney evaluation - eGFR measurement  11/26/2024   OPHTHALMOLOGY EXAM  07/23/2025   Medicare Annual Wellness (AWV)  08/12/2025   Pneumococcal Vaccine: 50+ Years  Completed   Hepatitis C Screening  Completed   Zoster Vaccines- Shingrix  Completed   HPV VACCINES  Aged Out   Meningococcal B Vaccine  Aged Out   DTaP/Tdap/Td  Discontinued   Colonoscopy  Discontinued    Health Maintenance  Health Maintenance Due  Topic Date Due   Diabetic kidney evaluation - Urine ACR  Never done   COVID-19 Vaccine (3 - Pfizer risk series) 06/08/2020   HEMOGLOBIN A1C  05/05/2024   FOOT EXAM  05/28/2024   INFLUENZA VACCINE  07/11/2024   Health Maintenance Items Addressed: None due at this time. Pt will receive vaccines at their pharmcy when decided to obtain   Additional Screening:  Vision Screening: Recommended annual ophthalmology exams for early detection of glaucoma and other disorders of the eye. Would you like a referral to an eye doctor? No    Dental Screening: Recommended annual dental exams for proper oral hygiene  Community Resource Referral / Chronic Care Management: CRR required this visit?  No   CCM required this visit?  Appt scheduled with PCP   Plan:    I have personally reviewed and noted the following in the patient's chart:   Medical and social history  Use of alcohol, tobacco or illicit drugs  Current medications and supplements including opioid  prescriptions. Patient is not currently taking opioid prescriptions. Functional ability and status Nutritional status Physical activity Advanced directives List of other physicians Hospitalizations, surgeries, and ER visits in previous 12 months Vitals Screenings to include cognitive, depression, and falls Referrals and appointments  In addition, I have reviewed and discussed with patient certain preventive protocols, quality metrics, and best practice recommendations. A written personalized care plan for preventive services as well as general preventive health recommendations were provided to patient.   Erminio LITTIE Saris, LPN   [id number]   After Visit Summary: (Declined) Due to this being a telephonic visit, with patients personalized plan was offered to patient but patient Declined AVS at this time   Notes: Nothing significant to report at this time.

## 2024-08-18 LAB — HM DIABETES EYE EXAM

## 2024-08-19 ENCOUNTER — Encounter: Payer: Self-pay | Admitting: Family Medicine

## 2024-09-15 ENCOUNTER — Telehealth: Payer: Self-pay | Admitting: Family Medicine

## 2024-09-15 NOTE — Telephone Encounter (Signed)
 Patient's wife dropped off flu vaccine rx to front office, placed in provider's box

## 2024-09-15 NOTE — Telephone Encounter (Signed)
Flu vaccine documented in chart.

## 2024-10-29 ENCOUNTER — Telehealth: Payer: Self-pay | Admitting: Family Medicine

## 2024-10-29 NOTE — Telephone Encounter (Signed)
 Copied from CRM #[id number]. Topic: Clinical - Medication Question >> Oct 28, 2024  3:07 PM Mesmerise C wrote: Reason for CRM: Patient picks up his ozempic  at office inquiring if it's ready to be picked up this afternoon, called [employer] and spoke to Randall stated if no ones called then not ready please call [phone] if can be ready to pick up, also inquiring about a letter that was received stating a lot of people aren't going to be able to get assistance with getting ozempic  unless the physician approves it received from Nov[employer]isk assistance program

## 2024-10-31 ENCOUNTER — Encounter: Payer: Self-pay | Admitting: Family Medicine

## 2024-10-31 MED ORDER — OZEMPIC (0.25 OR 0.5 MG/DOSE) 2 MG/3ML ~~LOC~~ SOPN: 0.5000 mg | PEN_INJECTOR | SUBCUTANEOUS | Status: DC

## 2024-10-31 NOTE — Telephone Encounter (Addendum)
 Please offer him a box of ozempic  0.5mg  weekly if we have extras in the office.   Please notify - ozempic  will no longer be offered through patient assistance programs in 2026. Would have him check with his insurance to see how expensive Ozempic , Trulicity  or Mounjaro would be starting 2026. We could switch to another if they are more affordable.

## 2024-10-31 NOTE — Telephone Encounter (Signed)
 Patient states he will take the box of Ozempic  and will try to come by the office today.  Reviewed all information and will call if any questions.

## 2024-10-31 NOTE — Addendum Note (Signed)
 Addended by: SEBASTIAN DANNA GRADE on: 10/31/2024 04:53 PM   Modules accepted: Orders

## 2024-10-31 NOTE — Telephone Encounter (Signed)
 Medication Samples have been provided to the patient.  Drug name: ozempic        Strength: 0.25/0.50        Qty: 0  LOT: [id number]  Exp.Date: 12/10/26  Dosing instructions: 0.5mg  injection weekly   The patient has been instructed regarding the correct time, dose, and frequency of taking this medication, including desired effects and most common side effects.   Devin Huffman Devin Huffman 4:50 PM 10/31/2024

## 2024-10-31 NOTE — Telephone Encounter (Signed)
 Patient arrived in office to pick up medication today

## 2024-12-16 ENCOUNTER — Ambulatory Visit: Admitting: Family Medicine

## 2024-12-16 ENCOUNTER — Telehealth: Payer: Self-pay | Admitting: Family Medicine

## 2024-12-16 ENCOUNTER — Encounter: Payer: Self-pay | Admitting: Family Medicine

## 2024-12-16 VITALS — BP 170/90 | HR 63 | Temp 97.8°F | Ht 71.0 in | Wt 230.0 lb

## 2024-12-16 DIAGNOSIS — I110 Hypertensive heart disease with heart failure: Secondary | ICD-10-CM | POA: Diagnosis not present

## 2024-12-16 DIAGNOSIS — E1169 Type 2 diabetes mellitus with other specified complication: Secondary | ICD-10-CM | POA: Diagnosis not present

## 2024-12-16 DIAGNOSIS — Z7984 Long term (current) use of oral hypoglycemic drugs: Secondary | ICD-10-CM | POA: Diagnosis not present

## 2024-12-16 DIAGNOSIS — E1140 Type 2 diabetes mellitus with diabetic neuropathy, unspecified: Secondary | ICD-10-CM | POA: Diagnosis not present

## 2024-12-16 DIAGNOSIS — E1136 Type 2 diabetes mellitus with diabetic cataract: Secondary | ICD-10-CM

## 2024-12-16 DIAGNOSIS — E785 Hyperlipidemia, unspecified: Secondary | ICD-10-CM | POA: Diagnosis not present

## 2024-12-16 DIAGNOSIS — M1A0710 Idiopathic chronic gout, right ankle and foot, without tophus (tophi): Secondary | ICD-10-CM

## 2024-12-16 DIAGNOSIS — I5032 Chronic diastolic (congestive) heart failure: Secondary | ICD-10-CM

## 2024-12-16 DIAGNOSIS — E66811 Obesity, class 1: Secondary | ICD-10-CM

## 2024-12-16 DIAGNOSIS — Z6832 Body mass index (BMI) 32.0-32.9, adult: Secondary | ICD-10-CM | POA: Diagnosis not present

## 2024-12-16 DIAGNOSIS — Z7189 Other specified counseling: Secondary | ICD-10-CM

## 2024-12-16 DIAGNOSIS — I5030 Unspecified diastolic (congestive) heart failure: Secondary | ICD-10-CM

## 2024-12-16 DIAGNOSIS — K219 Gastro-esophageal reflux disease without esophagitis: Secondary | ICD-10-CM | POA: Diagnosis not present

## 2024-12-16 DIAGNOSIS — Z0001 Encounter for general adult medical examination with abnormal findings: Secondary | ICD-10-CM

## 2024-12-16 DIAGNOSIS — I4891 Unspecified atrial fibrillation: Secondary | ICD-10-CM | POA: Diagnosis not present

## 2024-12-16 DIAGNOSIS — Z23 Encounter for immunization: Secondary | ICD-10-CM

## 2024-12-16 DIAGNOSIS — Z0000 Encounter for general adult medical examination without abnormal findings: Secondary | ICD-10-CM

## 2024-12-16 DIAGNOSIS — E538 Deficiency of other specified B group vitamins: Secondary | ICD-10-CM

## 2024-12-16 DIAGNOSIS — I10 Essential (primary) hypertension: Secondary | ICD-10-CM

## 2024-12-16 LAB — COMPREHENSIVE METABOLIC PANEL WITH GFR
ALT: 22 U/L (ref 3–53)
AST: 15 U/L (ref 5–37)
Albumin: 4.4 g/dL (ref 3.5–5.2)
Alkaline Phosphatase: 73 U/L (ref 39–117)
BUN: 20 mg/dL (ref 6–23)
CO2: 27 meq/L (ref 19–32)
Calcium: 9.6 mg/dL (ref 8.4–10.5)
Chloride: 106 meq/L (ref 96–112)
Creatinine, Ser: 1.18 mg/dL (ref 0.40–1.50)
GFR: 58.42 mL/min — ABNORMAL LOW
Glucose, Bld: 133 mg/dL — ABNORMAL HIGH (ref 70–99)
Potassium: 5 meq/L (ref 3.5–5.1)
Sodium: 141 meq/L (ref 135–145)
Total Bilirubin: 0.5 mg/dL (ref 0.2–1.2)
Total Protein: 7 g/dL (ref 6.0–8.3)

## 2024-12-16 LAB — CBC WITH DIFFERENTIAL/PLATELET
Basophils Absolute: 0 K/uL (ref 0.0–0.1)
Basophils Relative: 0.3 % (ref 0.0–3.0)
Eosinophils Absolute: 0.3 K/uL (ref 0.0–0.7)
Eosinophils Relative: 2.9 % (ref 0.0–5.0)
HCT: 43.3 % (ref 39.0–52.0)
Hemoglobin: 14.4 g/dL (ref 13.0–17.0)
Lymphocytes Relative: 23.2 % (ref 12.0–46.0)
Lymphs Abs: 2.2 K/uL (ref 0.7–4.0)
MCHC: 33.4 g/dL (ref 30.0–36.0)
MCV: 100 fl (ref 78.0–100.0)
Monocytes Absolute: 0.8 K/uL (ref 0.1–1.0)
Monocytes Relative: 7.9 % (ref 3.0–12.0)
Neutro Abs: 6.3 K/uL (ref 1.4–7.7)
Neutrophils Relative %: 65.7 % (ref 43.0–77.0)
Platelets: 252 K/uL (ref 150.0–400.0)
RBC: 4.33 Mil/uL (ref 4.22–5.81)
RDW: 12.3 % (ref 11.5–15.5)
WBC: 9.5 K/uL (ref 4.0–10.5)

## 2024-12-16 LAB — LIPID PANEL
Cholesterol: 118 mg/dL (ref 28–200)
HDL: 43.2 mg/dL
LDL Cholesterol: 42 mg/dL (ref 10–99)
NonHDL: 74.6
Total CHOL/HDL Ratio: 3
Triglycerides: 165 mg/dL — ABNORMAL HIGH (ref 10.0–149.0)
VLDL: 33 mg/dL (ref 0.0–40.0)

## 2024-12-16 LAB — FOLATE: Folate: 9.6 ng/mL

## 2024-12-16 LAB — TSH: TSH: 2.02 u[IU]/mL (ref 0.35–5.50)

## 2024-12-16 LAB — MICROALBUMIN / CREATININE URINE RATIO
Creatinine,U: 108.1 mg/dL
Microalb Creat Ratio: 21.5 mg/g (ref 0.0–30.0)
Microalb, Ur: 2.3 mg/dL — ABNORMAL HIGH (ref 0.7–1.9)

## 2024-12-16 LAB — VITAMIN B12: Vitamin B-12: 650 pg/mL (ref 211–911)

## 2024-12-16 LAB — URIC ACID: Uric Acid, Serum: 7.7 mg/dL (ref 4.0–7.8)

## 2024-12-16 LAB — HEMOGLOBIN A1C: Hgb A1c MFr Bld: 7.1 % — ABNORMAL HIGH (ref 4.6–6.5)

## 2024-12-16 MED ORDER — METFORMIN HCL 500 MG PO TABS: 500.0000 mg | ORAL_TABLET | Freq: Two times a day (BID) | ORAL | 3 refills | Status: DC

## 2024-12-16 MED ORDER — TRULICITY 0.75 MG/0.5ML ~~LOC~~ SOAJ: 0.7500 mg | SUBCUTANEOUS | 0 refills | Status: DC

## 2024-12-16 MED ORDER — TRULICITY 1.5 MG/0.5ML ~~LOC~~ SOAJ: 1.5000 mg | SUBCUTANEOUS | 3 refills | Status: DC

## 2024-12-16 MED ORDER — DILTIAZEM HCL ER COATED BEADS 240 MG PO CP24: 240.0000 mg | ORAL_CAPSULE | Freq: Every day | ORAL | 3 refills | Status: DC

## 2024-12-16 MED ORDER — LOSARTAN POTASSIUM 25 MG PO TABS: 25.0000 mg | ORAL_TABLET | Freq: Every day | ORAL | 3 refills | Status: DC

## 2024-12-16 MED ORDER — APIXABAN 5 MG PO TABS: 5.0000 mg | ORAL_TABLET | Freq: Two times a day (BID) | ORAL | 3 refills | Status: DC

## 2024-12-16 MED ORDER — ROSUVASTATIN CALCIUM 5 MG PO TABS: 5.0000 mg | ORAL_TABLET | Freq: Every day | ORAL | 3 refills | Status: DC

## 2024-12-16 MED ORDER — CARVEDILOL 6.25 MG PO TABS: 6.2500 mg | ORAL_TABLET | Freq: Two times a day (BID) | ORAL | 3 refills | Status: DC

## 2024-12-16 MED ORDER — FUROSEMIDE 20 MG PO TABS: 20.0000 mg | ORAL_TABLET | ORAL | 3 refills | Status: DC

## 2024-12-16 NOTE — Assessment & Plan Note (Signed)
 S/p cataract extractions in 2025

## 2024-12-16 NOTE — Assessment & Plan Note (Signed)
 Chronic, update FLP on crestor  5mg  daily + fish oil . The ASCVD Risk score (Arnett DK, et al., 2019) failed to calculate for the following reasons:   The 2019 ASCVD risk score is only valid for ages 40 to 54   * - Cholesterol units were assumed

## 2024-12-16 NOTE — Assessment & Plan Note (Signed)
 Preventative protocols reviewed and updated unless pt declined. Discussed healthy diet and lifestyle.

## 2024-12-16 NOTE — Assessment & Plan Note (Signed)
 Appreciate cardiology care. Continue carvedilol , low dose losartan  25mg  (dose titration limited by h/o hyperkalemia and ACEI-induced angioedema), lasix , and diltiazem .  Consider SGLT2i

## 2024-12-16 NOTE — Assessment & Plan Note (Addendum)
 Chronic, anticipate poor control based on latest fasting CBGs 160-200s.  GLP1RA has been unaffordable, she was receiving ozempic  PAP in 2025.  Transition from ozempic  to Trulicity  due to PAP availability in 2026 Offered diabetes education /nutritionist referral  - declined Consider SGLT2i Recommend renewed efforts towards following diabetic diet and closer DM f/u - RTC 3 mo DM f/u visit.  Diabetes complicated by obesity, hypertension, gout, dyslipidemia, CHF

## 2024-12-16 NOTE — Telephone Encounter (Signed)
 Seen in office today  Ozempic  PAP no longer available. Will transition to Trulicity  and see if he qualifies for PAP.  Will forward note to pharmacy team, Manuelita for assistance.  New pharmacy referral placed.

## 2024-12-16 NOTE — Assessment & Plan Note (Signed)
 No recent gout flare.  Update uric acid.  Avoid colchicine  as it can interact with carvedilol  and diltiazem .

## 2024-12-16 NOTE — Assessment & Plan Note (Signed)
 Update levels on MWF replacement He is on metformin  longterm.

## 2024-12-16 NOTE — Assessment & Plan Note (Addendum)
 Previously discussed - they continue working on this.

## 2024-12-16 NOTE — Patient Instructions (Addendum)
 Prevnar-20 today  Labs today  Work on low sugar low carb diabetic diet. 222 I will have our pharmacy team reach out to you about Trulicity  commencement.  Good to see you today  Return in 3 months for diabetes follow up visit

## 2024-12-16 NOTE — Progress Notes (Unsigned)
 " Ph: [phone] Fax: [phone]   Patient ID: Devin Huffman, male    DOB: 08-16-1946, 79 y.o.   MRN: [id number]  This visit was conducted in person.  BP (!) 170/90 (BP Location: Right Arm, Patient Position: Sitting, Cuff Size: Normal)   Pulse 63   Temp 97.8 F (36.6 C) (Oral)   Ht 5' 11 (1.803 m)   Wt 230 lb (104.3 kg)   SpO2 95%   BMI 32.08 kg/m   BP Readings from Last 3 Encounters:  12/16/24 (!) 170/90  04/07/24 (!) 111/59  03/04/24 138/76  BP elevated on repeat testing  CC: CPE Subjective:   HPI: Devin Huffman is a 79 y.o. male presenting on 12/16/2024 for Annual Exam (Would like to discuss medications, concerns about worsening numbness in feet//Slow capillary refill, failed DM foot filament exam, little to no feeling in both feet)   Saw health advisor 08/2024 for medicare wellness visit. Note reviewed.   No results found.  Flowsheet Row Clinical Support from 08/12/2024 in [facility]  PHQ-2 Total Score 0       12/16/2024    9:24 AM 08/12/2024    1:47 PM 08/02/2023   10:36 AM 04/20/2023   10:59 AM 01/11/2023    2:44 PM  Fall Risk   Falls in the past year? 0 0 0 0 0  Number falls in past yr: 0 0 0 0 0  Injury with Fall? 0 0  0  0  0   Risk for fall due to :  No Fall Risks No Fall Risks No Fall Risks No Fall Risks  Follow up Falls evaluation completed Falls prevention discussed;Education provided Falls prevention discussed;Falls evaluation completed Falls evaluation completed Falls evaluation completed     Data saved with a previous flowsheet row definition     Parox atrial fibrillation and dCHF diagnosis s/p hospitalization 08/2022, now on eliquis  and diltiazem .   HTN - h/o angioedema to lisinopril . Regularly takes carvedilol  6.25mg  bid, losartan  25mg  and diltiazem  CD 240mg  daily. No HA, vision changes, CP/tightness, SOB, leg swelling. Has not yet taken BP medicines this morning.    He declined completing HST.   DM - recently  elevated readings. This is despite metformin  500mg  bid and ozempic  0.5mg  weekly, tolerated well but ran out last week. Was getting this through PAP - but no longer available 2026 - will transition to trulicity .   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    Preventative: COLONOSCOPY 04/2024 - 7 TA/SSP, no f/u recommended given age Maxine) Prostate cancer screening - normal in the past. We have stopped screening. No fmhx prostate cancer.  Lung cancer screening - not eligible  Flu shot yearly COVID vaccine - Pfizer 04/2020, 05/2020, no booster Tetanus 2010, Td 07/2020 Pneumovax 2013, prevnar-13 2015, prevnar-20 discussed, declines Zostavax 10/13/2013   Shingrix - 03/2022, 09/2022 Advanced directives: does not want prolonged life support. Ok for reversible cause. Wife would be HCPOA. Advanced directive packet previously provided.  Seat belt use discussed Sunscreen use discussed. No changing moles on skin. 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 No regular exercise.   Diet: some  water, enjoys sweets      Relevant past medical, surgical, family and social history reviewed and updated as indicated. Interim medical history since our last visit reviewed. Allergies and medications reviewed and updated. Outpatient Medications Prior to Visit  Medication Sig Dispense Refill   Ascorbic Acid (VITAMIN C ) 1000 MG tablet Take 1,000 mg by mouth every evening.      blood glucose meter kit and supplies KIT Dispense based on patient and insurance preference. Use to check sugars up to twice daily as directed (E11.69). 1 each 0   Cholecalciferol (VITAMIN D3) 25 MCG (1000 UT) CAPS  Take 2 capsules (2,000 Units total) by mouth every evening.     Coenzyme Q10 100 MG capsule Take 1 capsule (100 mg total) by mouth at bedtime.     cyanocobalamin  (VITAMIN B12) 1000 MCG tablet Take 1 tablet (1,000 mcg total) by mouth every Monday, Wednesday, and Friday.     glucose blood (GLUCOSE METER TEST) test strip Use as instructed to check sugars twice daily as needed E11.69 100 each 12   Multiple Vitamins-Minerals (EYE VITAMINS PO) Take 2 tablets by mouth at bedtime.     Omega-3 Fatty Acids (FISH OIL  PO) Take 1 capsule by mouth daily.     apixaban  (ELIQUIS ) 5 MG TABS tablet Take 1 tablet (5 mg total) by mouth 2 (two) times daily. 180 tablet 2   Calcium  Carbonate Antacid (TUMS CHEWY BITES PO) Take 2 tablets by mouth at bedtime as needed (reflux).     carvedilol  (COREG ) 6.25 MG tablet Take 1 tablet (6.25 mg total) by mouth 2 (two) times daily with a meal. 180 tablet 3   diltiazem  (CARDIZEM  CD) 240 MG 24 hr capsule Take 1 capsule (240 mg total) by mouth daily. 90 capsule 3   furosemide  (LASIX ) 20 MG tablet Take 1 tablet (20 mg total) by mouth every other day. 45 tablet 3   losartan  (COZAAR ) 25 MG tablet Take 1 tablet (25 mg total) by mouth daily. 90 tablet 3   metFORMIN  (GLUCOPHAGE ) 500 MG tablet Take 1 tablet (500 mg total) by mouth 2 (two) times daily with a meal. For diabetes 180 tablet 3   rosuvastatin  (CRESTOR ) 5 MG tablet Take 1 tablet (5 mg total) by mouth daily. 90 tablet 3   Semaglutide ,0.25 or 0.5MG /DOS, (OZEMPIC , 0.25 OR 0.5 MG/DOSE,) 2 MG/3ML SOPN Inject 0.5 mg as directed once a week.     Semaglutide ,0.25 or 0.5MG /DOS, (OZEMPIC , 0.25 OR 0.5 MG/DOSE,) 2 MG/3ML SOPN Inject 0.5 mg into the skin once a week.     omeprazole  (PRILOSEC) 40 MG capsule Take 1 capsule (40 mg total) by mouth daily as needed. (Patient not taking: Reported on 12/16/2024) 90 capsule 3   predniSONE  (DELTASONE ) 20 MG tablet Take two tablets daily for 4 days followed by one tablet daily for 4 days (Patient not taking:  Reported on 12/16/2024) 12 tablet 0   No facility-administered medications prior to visit.     Per HPI unless specifically indicated in ROS section below Review of Systems  Constitutional:  Negative for activity change, appetite change, chills, fatigue, fever and unexpected weight change.  HENT:  Negative for hearing loss.   Eyes:  Negative for visual disturbance.  Respiratory:  Positive for shortness of breath (exertional ie going up a flight of stairs). Negative for cough, chest tightness and wheezing.   Cardiovascular:  Negative for chest pain, palpitations and leg swelling.  Gastrointestinal:  Negative for abdominal distention, abdominal pain, blood in stool, constipation,  diarrhea, nausea and vomiting.  Genitourinary:  Negative for difficulty urinating and hematuria.  Musculoskeletal:  Negative for arthralgias, myalgias and neck pain.  Skin:  Negative for rash.  Neurological:  Negative for dizziness, seizures, syncope and headaches.  Hematological:  Negative for adenopathy. Does not bruise/bleed easily.  Psychiatric/Behavioral:  Negative for dysphoric mood. The patient is not nervous/anxious.     Objective:  BP (!) 170/90 (BP Location: Right Arm, Patient Position: Sitting, Cuff Size: Normal)   Pulse 63   Temp 97.8 F (36.6 C) (Oral)   Ht 5' 11 (1.803 m)   Wt 230 lb (104.3 kg)   SpO2 95%   BMI 32.08 kg/m   Wt Readings from Last 3 Encounters:  12/16/24 230 lb (104.3 kg)  08/12/24 227 lb (103 kg)  04/07/24 227 lb (103 kg)      Physical Exam Vitals and nursing note reviewed.  Constitutional:      General: He is not in acute distress.    Appearance: Normal appearance. He is well-developed. He is not ill-appearing.  HENT:     Head: Normocephalic and atraumatic.     Right Ear: Hearing, tympanic membrane, ear canal and external ear normal.     Left Ear: Hearing, tympanic membrane, ear canal and external ear normal.     Mouth/Throat:     Mouth: Mucous membranes are moist.      Pharynx: Oropharynx is clear. No oropharyngeal exudate or posterior oropharyngeal erythema.  Eyes:     General: No scleral icterus.    Extraocular Movements: Extraocular movements intact.     Conjunctiva/sclera: Conjunctivae normal.     Pupils: Pupils are equal, round, and reactive to light.  Neck:     Thyroid : No thyroid  mass or thyromegaly.     Vascular: No carotid bruit.  Cardiovascular:     Rate and Rhythm: Normal rate and regular rhythm.     Pulses: Normal pulses.          Radial pulses are 2+ on the right side and 2+ on the left side.     Heart sounds: Normal heart sounds. No murmur heard. Pulmonary:     Effort: Pulmonary effort is normal. No respiratory distress.     Breath sounds: Normal breath sounds. No wheezing, rhonchi or rales.  Abdominal:     General: Bowel sounds are normal. There is no distension.     Palpations: Abdomen is soft. There is no mass.     Tenderness: There is no abdominal tenderness. There is no guarding or rebound.     Hernia: No hernia is present.  Musculoskeletal:        General: Normal range of motion.     Cervical back: Normal range of motion and neck supple.     Right lower leg: No edema.     Left lower leg: No edema.  Lymphadenopathy:     Cervical: No cervical adenopathy.  Skin:    General: Skin is warm and dry.     Findings: No rash.  Neurological:     General: No focal deficit present.     Mental Status: He is alert and oriented to person, place, and time.  Psychiatric:        Mood and Affect: Mood normal.        Behavior: Behavior normal.        Thought Content: Thought content normal.        Judgment: Judgment normal.    Diabetic Foot Exam - Simple   Simple  Foot Form Diabetic Foot exam was performed with the following findings: Yes 12/16/2024  9:45 AM  Visual Inspection No deformities, no ulcerations, no other skin breakdown bilaterally: Yes Sensation Testing See comments: Yes Pulse Check See comments: Yes Comments No  claudication Diminished DP bilaterally Diminished sensation to monofilament testing        Results for orders placed or performed in visit on 12/16/24  Hemoglobin A1c   Collection Time: 12/16/24 10:04 AM  Result Value Ref Range   Hgb A1c MFr Bld 7.1 (H) 4.6 - 6.5 %  Microalbumin / creatinine urine ratio   Collection Time: 12/16/24 10:04 AM  Result Value Ref Range   Microalb, Ur 2.3 (H) 0.7 - 1.9 mg/dL   Creatinine,U 891.8 mg/dL   Microalb Creat Ratio 21.5 0.0 - 30.0 mg/g  Lipid panel   Collection Time: 12/16/24 10:04 AM  Result Value Ref Range   Cholesterol 118 28 - 200 mg/dL   Triglycerides 834.9 (H) 10.0 - 149.0 mg/dL   HDL 56.79 >60.99 mg/dL   VLDL 66.9 0.0 - 59.9 mg/dL   LDL Cholesterol 42 10 - 99 mg/dL   Total CHOL/HDL Ratio 3    NonHDL 74.60   Comprehensive metabolic panel with GFR   Collection Time: 12/16/24 10:04 AM  Result Value Ref Range   Sodium 141 135 - 145 mEq/L   Potassium 5.0 3.5 - 5.1 mEq/L   Chloride 106 96 - 112 mEq/L   CO2 27 19 - 32 mEq/L   Glucose, Bld 133 (H) 70 - 99 mg/dL   BUN 20 6 - 23 mg/dL   Creatinine, Ser 8.81 0.40 - 1.50 mg/dL   Total Bilirubin 0.5 0.2 - 1.2 mg/dL   Alkaline Phosphatase 73 39 - 117 U/L   AST 15 5 - 37 U/L   ALT 22 3 - 53 U/L   Total Protein 7.0 6.0 - 8.3 g/dL   Albumin  4.4 3.5 - 5.2 g/dL   GFR 41.57 (L) >39.99 mL/min   Calcium  9.6 8.4 - 10.5 mg/dL  Uric acid   Collection Time: 12/16/24 10:04 AM  Result Value Ref Range   Uric Acid, Serum 7.7 4.0 - 7.8 mg/dL  Vitamin A87   Collection Time: 12/16/24 10:04 AM  Result Value Ref Range   Vitamin B-12 650 211 - 911 pg/mL  CBC with Differential/Platelet   Collection Time: 12/16/24 10:04 AM  Result Value Ref Range   WBC 9.5 4.0 - 10.5 K/uL   RBC 4.33 4.22 - 5.81 Mil/uL   Hemoglobin 14.4 13.0 - 17.0 g/dL   HCT 56.6 60.9 - 47.9 %   MCV 100.0 78.0 - 100.0 fl   MCHC 33.4 30.0 - 36.0 g/dL   RDW 87.6 88.4 - 84.4 %   Platelets 252.0 150.0 - 400.0 K/uL   Neutrophils  Relative % 65.7 43.0 - 77.0 %   Lymphocytes Relative 23.2 12.0 - 46.0 %   Monocytes Relative 7.9 3.0 - 12.0 %   Eosinophils Relative 2.9 0.0 - 5.0 %   Basophils Relative 0.3 0.0 - 3.0 %   Neutro Abs 6.3 1.4 - 7.7 K/uL   Lymphs Abs 2.2 0.7 - 4.0 K/uL   Monocytes Absolute 0.8 0.1 - 1.0 K/uL   Eosinophils Absolute 0.3 0.0 - 0.7 K/uL   Basophils Absolute 0.0 0.0 - 0.1 K/uL  TSH   Collection Time: 12/16/24 10:04 AM  Result Value Ref Range   TSH 2.02 0.35 - 5.50 uIU/mL  Folate   Collection Time: 12/16/24 10:04 AM  Result Value Ref Range   Folate 9.6 >5.9 ng/mL    Assessment & Plan:   Problem List Items Addressed This Visit     Advanced care planning/counseling discussion (Chronic)   Previously discussed - they continue working on this.       Health maintenance examination - Primary (Chronic)   Preventative protocols reviewed and updated unless pt declined. Discussed healthy diet and lifestyle.       Dyslipidemia associated with type 2 diabetes mellitus (HCC)   Chronic, update FLP on crestor  5mg  daily + fish oil . The ASCVD Risk score (Arnett DK, et al., 2019) failed to calculate for the following reasons:   The 2019 ASCVD risk score is only valid for ages 49 to 66   * - Cholesterol units were assumed       Relevant Medications   Dulaglutide  (TRULICITY ) 0.75 MG/0.5ML SOAJ   Dulaglutide  (TRULICITY ) 1.5 MG/0.5ML SOAJ (Start on 01/13/2025)   losartan  (COZAAR ) 25 MG tablet   metFORMIN  (GLUCOPHAGE ) 500 MG tablet   rosuvastatin  (CRESTOR ) 5 MG tablet   Other Relevant Orders   Lipid panel (Completed)   Comprehensive metabolic panel with GFR (Completed)   Gout   No recent gout flare.  Update uric acid.  Avoid colchicine  as it can interact with carvedilol  and diltiazem .       Relevant Orders   Uric acid (Completed)   Essential hypertension   Chronic, deteriorated.  Has not yet taken am meds. Previously well controlled to somewhat low. Rec start monitoring BP at home, notify us   if persistently >140/90 for med titration.  Reassess at DM f/u in 3 months.       Relevant Medications   apixaban  (ELIQUIS ) 5 MG TABS tablet   carvedilol  (COREG ) 6.25 MG tablet   diltiazem  (CARDIZEM  CD) 240 MG 24 hr capsule   furosemide  (LASIX ) 20 MG tablet   losartan  (COZAAR ) 25 MG tablet   rosuvastatin  (CRESTOR ) 5 MG tablet   Type 2 diabetes mellitus with other specified complication (HCC)   Chronic, anticipate poor control based on latest fasting CBGs 160-200s.  GLP1RA has been unaffordable, she was receiving ozempic  PAP in 2025.  Transition from ozempic  to Trulicity  due to PAP availability in 2026 Offered diabetes education /nutritionist referral  - declined Consider SGLT2i Recommend renewed efforts towards following diabetic diet and closer DM f/u - RTC 3 mo DM f/u visit.  Diabetes complicated by obesity, hypertension, gout, dyslipidemia, CHF      Relevant Medications   Dulaglutide  (TRULICITY ) 0.75 MG/0.5ML SOAJ   Dulaglutide  (TRULICITY ) 1.5 MG/0.5ML SOAJ (Start on 01/13/2025)   losartan  (COZAAR ) 25 MG tablet   metFORMIN  (GLUCOPHAGE ) 500 MG tablet   rosuvastatin  (CRESTOR ) 5 MG tablet   Other Relevant Orders   Hemoglobin A1c (Completed)   Microalbumin / creatinine urine ratio (Completed)   Vitamin B12 (Completed)   AMB Referral VBCI Care Management   GERD (gastroesophageal reflux disease)   Stable period on PRN PPI       Vitamin B12 deficiency   Update levels on MWF replacement He is on metformin  longterm.       Relevant Orders   Vitamin B12 (Completed)   Obesity, Class I, BMI 30-34.9   Encouragd healthy diet and lifestyle choices to effect sustainable weight loss.       Type 2 diabetes mellitus with diabetic neuropathy, unspecified (HCC)   Chronic, worsening. Presumed diabetic neuropathy. Reviewed importance of tighter glycemic control.  Update labs including SPEP, B12, folate, TSH, CBC.  ALA supplement,  gabapentin  previously ineffective. Consider Lyrica if  needed.       Relevant Medications   Dulaglutide  (TRULICITY ) 0.75 MG/0.5ML SOAJ   Dulaglutide  (TRULICITY ) 1.5 MG/0.5ML SOAJ (Start on 01/13/2025)   losartan  (COZAAR ) 25 MG tablet   metFORMIN  (GLUCOPHAGE ) 500 MG tablet   rosuvastatin  (CRESTOR ) 5 MG tablet   Other Relevant Orders   TSH (Completed)   Folate (Completed)   Vitamin B1   Serum protein electrophoresis with reflex   AMB Referral VBCI Care Management   Diabetic cataract of both eyes (HCC)   S/p cataract extractions in 2025      Relevant Medications   Dulaglutide  (TRULICITY ) 0.75 MG/0.5ML SOAJ   Dulaglutide  (TRULICITY ) 1.5 MG/0.5ML SOAJ (Start on 01/13/2025)   losartan  (COZAAR ) 25 MG tablet   metFORMIN  (GLUCOPHAGE ) 500 MG tablet   rosuvastatin  (CRESTOR ) 5 MG tablet   Atrial fibrillation with RVR (HCC)   Appreciate cardiology care - continue eliquis , carvedilol , diltiazem  .      Relevant Medications   apixaban  (ELIQUIS ) 5 MG TABS tablet   carvedilol  (COREG ) 6.25 MG tablet   diltiazem  (CARDIZEM  CD) 240 MG 24 hr capsule   furosemide  (LASIX ) 20 MG tablet   losartan  (COZAAR ) 25 MG tablet   rosuvastatin  (CRESTOR ) 5 MG tablet   Other Relevant Orders   CBC with Differential/Platelet (Completed)   (HFpEF) heart failure with preserved ejection fraction [facility])   Appreciate cardiology care. Continue carvedilol , low dose losartan  25mg  (dose titration limited by h/o hyperkalemia and ACEI-induced angioedema), lasix , and diltiazem .  Consider SGLT2i      Relevant Medications   apixaban  (ELIQUIS ) 5 MG TABS tablet   carvedilol  (COREG ) 6.25 MG tablet   diltiazem  (CARDIZEM  CD) 240 MG 24 hr capsule   furosemide  (LASIX ) 20 MG tablet   losartan  (COZAAR ) 25 MG tablet   rosuvastatin  (CRESTOR ) 5 MG tablet   Other Visit Diagnoses       Need for vaccination against Streptococcus pneumoniae       Relevant Orders   Pneumococcal conjugate vaccine 20-valent (Prevnar 20) (Completed)        Meds ordered this encounter  Medications    Dulaglutide  (TRULICITY ) 0.75 MG/0.5ML SOAJ    Sig: Inject 0.75 mg into the skin once a week.    Dispense:  2 mL    Refill:  0   Dulaglutide  (TRULICITY ) 1.5 MG/0.5ML SOAJ    Sig: Inject 1.5 mg into the skin once a week.    Dispense:  2 mL    Refill:  3   apixaban  (ELIQUIS ) 5 MG TABS tablet    Sig: Take 1 tablet (5 mg total) by mouth 2 (two) times daily.    Dispense:  180 tablet    Refill:  3   carvedilol  (COREG ) 6.25 MG tablet    Sig: Take 1 tablet (6.25 mg total) by mouth 2 (two) times daily with a meal.    Dispense:  180 tablet    Refill:  3   diltiazem  (CARDIZEM  CD) 240 MG 24 hr capsule    Sig: Take 1 capsule (240 mg total) by mouth daily.    Dispense:  90 capsule    Refill:  3   furosemide  (LASIX ) 20 MG tablet    Sig: Take 1 tablet (20 mg total) by mouth every Monday, Wednesday, and Friday.    Dispense:  45 tablet    Refill:  3   losartan  (COZAAR ) 25 MG tablet    Sig: Take 1 tablet (25 mg total) by mouth daily.  Dispense:  90 tablet    Refill:  3   metFORMIN  (GLUCOPHAGE ) 500 MG tablet    Sig: Take 1 tablet (500 mg total) by mouth 2 (two) times daily with a meal. For diabetes    Dispense:  180 tablet    Refill:  3   rosuvastatin  (CRESTOR ) 5 MG tablet    Sig: Take 1 tablet (5 mg total) by mouth daily.    Dispense:  90 tablet    Refill:  3    Orders Placed This Encounter  Procedures   Pneumococcal conjugate vaccine 20-valent (Prevnar 20)   Hemoglobin A1c   Microalbumin / creatinine urine ratio   Lipid panel   Comprehensive metabolic panel with GFR   Uric acid   Vitamin B12   CBC with Differential/Platelet   TSH   Folate   Vitamin B1   Serum protein electrophoresis with reflex   AMB Referral VBCI Care Management    Referral Priority:   Routine    Referral Type:   Consultation    Referral Reason:   Care Coordination    Number of Visits Requested:   1    Patient Instructions  Prevnar-20 today  Labs today  Work on low sugar low carb diabetic diet. 222 I  will have our pharmacy team reach out to you about Trulicity  commencement.  Good to see you today  Return in 3 months for diabetes follow up visit   Follow up plan: Return in about 3 months (around 03/16/2025) for follow up visit.  Anton Blas, MD   "

## 2024-12-16 NOTE — Assessment & Plan Note (Signed)
 Chronic, worsening. Presumed diabetic neuropathy. Reviewed importance of tighter glycemic control.  Update labs including SPEP, B12, folate, TSH, CBC.  ALA supplement, gabapentin  previously ineffective. Consider Lyrica if needed.

## 2024-12-16 NOTE — Assessment & Plan Note (Signed)
 Appreciate cardiology care - continue eliquis , carvedilol , diltiazem  .

## 2024-12-16 NOTE — Assessment & Plan Note (Addendum)
 Chronic, deteriorated.  Has not yet taken am meds. Previously well controlled to somewhat low. Rec start monitoring BP at home, notify us  if persistently >140/90 for med titration.  Reassess at DM f/u in 3 months.

## 2024-12-17 NOTE — Assessment & Plan Note (Signed)
Stable period on PRN PPI 

## 2024-12-17 NOTE — Assessment & Plan Note (Signed)
 Encouragd healthy diet and lifestyle choices to effect sustainable weight loss.

## 2024-12-18 ENCOUNTER — Other Ambulatory Visit (HOSPITAL_COMMUNITY): Payer: Self-pay

## 2024-12-18 ENCOUNTER — Telehealth: Payer: Self-pay

## 2024-12-18 NOTE — Telephone Encounter (Signed)
 Pharmacy Patient Advocate Encounter   Received notification from [facility] KEY that prior authorization for Trulicity  0.75 is required/requested.   Insurance verification completed.   The patient is insured through [facility].   Per test claim: PA required; PA submitted to above mentioned insurance via Latent Key/confirmation #/EOC [id number] Status is pending

## 2024-12-19 ENCOUNTER — Other Ambulatory Visit (HOSPITAL_COMMUNITY): Payer: Self-pay

## 2024-12-19 NOTE — Telephone Encounter (Signed)
 Copied from CRM #[id number]. Topic: Clinical - Medical Advice >> Dec 19, 2024 12:01 PM Victoria A wrote: Reason for CRM: Patient called said that Dr.G was going to put him on Trulicity  but he has not heard anything from office. Patient said that he takes his shots on Sunday so he will not have any for this Sunday and would Manuelita to call him please - [phone]

## 2024-12-19 NOTE — Telephone Encounter (Unsigned)
 Copied from CRM [id number]. Topic: Clinical - Prescription Issue >> Dec 19, 2024  2:24 PM Roselie BROCKS wrote: Reason for CRM: Patients wife, calling for Morna, she states she was going to look into a program that is supposed to help reduce cost of the medication Trulicity  , Patient requests a return call concerning this. Patient needs samples of ozempic  or Trulicity  , he is due for the doze and will be out.

## 2024-12-19 NOTE — Telephone Encounter (Signed)
 Pharmacy Patient Advocate Encounter  Received notification from [facility] that Prior Authorization for Trulicity  0.75 has been APPROVED from 12/18/24 to 12/18/25   PA #/Case ID/Reference #: # [id number]

## 2024-12-19 NOTE — Telephone Encounter (Signed)
 Called patient made aware of approval. Will call if any issues getting picked up.

## 2024-12-20 LAB — PROTEIN ELECTROPHORESIS, SERUM, WITH REFLEX
Albumin ELP: 4.2 g/dL (ref 3.8–4.8)
Alpha 1: 0.3 g/dL (ref 0.2–0.3)
Alpha 2: 0.8 g/dL (ref 0.5–0.9)
Beta 2: 0.4 g/dL (ref 0.2–0.5)
Beta Globulin: 0.5 g/dL (ref 0.4–0.6)
Gamma Globulin: 0.9 g/dL (ref 0.8–1.7)
Total Protein: 7 g/dL (ref 6.1–8.1)

## 2024-12-20 LAB — VITAMIN B1: Vitamin B1 (Thiamine): 8 nmol/L (ref 8–30)

## 2024-12-22 NOTE — Telephone Encounter (Unsigned)
 Copied from CRM #[id number]. Topic: General - Other >> Dec 22, 2024 12:32 PM Joesph NOVAK wrote: Reason for CRM: patients wife is calling to speak to [facility] about PAP approval. Please call pt.   PH: [phone]

## 2024-12-23 ENCOUNTER — Other Ambulatory Visit: Payer: Self-pay | Admitting: Pharmacist

## 2024-12-23 ENCOUNTER — Ambulatory Visit: Payer: Self-pay | Admitting: Family Medicine

## 2024-12-23 ENCOUNTER — Other Ambulatory Visit: Payer: Self-pay | Admitting: Family Medicine

## 2024-12-23 DIAGNOSIS — E1169 Type 2 diabetes mellitus with other specified complication: Secondary | ICD-10-CM

## 2024-12-23 DIAGNOSIS — I4891 Unspecified atrial fibrillation: Secondary | ICD-10-CM

## 2024-12-23 DIAGNOSIS — E519 Thiamine deficiency, unspecified: Secondary | ICD-10-CM | POA: Insufficient documentation

## 2024-12-23 MED ORDER — THIAMINE HCL 100 MG PO TABS: 100.0000 mg | ORAL_TABLET | ORAL | Status: AC

## 2024-12-23 NOTE — Telephone Encounter (Signed)
 Duplicate RX request, just sent on 12/16/2024

## 2024-12-23 NOTE — Progress Notes (Signed)
" ° °  12/24/2024 Name: Devin Huffman MRN: [id number] DOB: 1946-02-03  Subjective  Chief Complaint  Patient presents with   Diabetes   Care Team: Primary Care Provider: Rilla Baller, MD  Reason for visit: ?  DANIAL HLAVAC is a 79 y.o. male who presents today for a telephone visit with the pharmacist due to medication access concerns regarding their Trulicity .   Medication Access: ? Reports that all medications are not affordable.  Was getting Ozempic  through patient assistance last year.  Trulicity  and Eliquis  were unaffordable at [facility]. Wife spoke to the insurance company who reports their preferred pharmacy in [place]. Wife spoke with [facility] and they confirms a lower cost of Eliquis  that she felt was more reasonable/affordable.   Last dose of Ozempic  was 0.5 mg on 1/4. First missed dose was this past Sunday. Confirms still taking metformin . Has glucometer to use as needed.   Prescription drug coverage: YES Payor: BLUE CROSS BLUE SHIELD MEDICARE / Plan: BCBS MEDICARE / Product Type: *No Product type* / .   Current Patient Assistance: N/A - Was enrolled in [employer] for 2025 for Ozempic    Patient lives in a household of 2 with an estimated combined annual income of <300% FPL. via [employer].  Medicare LIS Eligible: No   Assessment and Plan:   1. Medication Access Patient requests all medications be re-ordered to [facility] - per insurance preference 2026/ New preferred mail order = [facility] (cheapest copays) - Message sent to Clinical pool  Reviewed PAP application for Trulicity  with patient and wife. They are aware that approval and medication delivery may take several weeks total. With current A1c well-controlled on low dose of Ozempic  and metformin , a few weeks off of GLP1 may be fine. Reasonable to check home BG a few days a week, fasting. Advised to call clinic if concerns for numbers over 150 in the morning or other concerns.   Patient  Assistance Program (PAP) Application Manufacturer: Secretary/administrator    (New enrollment) Medication(s): Trulicity  0.75 mg  Patient Portion of Application:  12/24/24: Completed with patient via online enrollment tool. Submitted. Uploaded to Media Tab. [id number] Income Documentation: N/A - Electronic verification elected.  Provider Portion of Application:  12/24/24: Provider portion completed by Pharmacist and placed in PCP inbox for signature. Prescription(s): Electronic Rx sent to [facility])   Next Steps: [x]    Provider portion of application filled out and placed in inbox for review/signature []    Upon PCP signature, Application to be faxed to [facility] PAP team AND scanned to chart: [facility] PAP Team: CPhT Patient Advocate Team Fax: [phone]  Note routed to PCP Clinic Pool and Med Advocate Team Med advocate team: Patient pages submitted online, provider pages signed but not submitted. Will fax to onbase, please fax to [facility] clinic team - Please Addend/update this note as the Next Steps are completed in office*   Future Appointments  Date Time Provider Department Center  03/17/2025  8:00 AM Rilla Baller, MD [facility] [address]  08/14/2025  3:00 PM [facility] [place] VISIT 2 [facility] [address]   Manuelita FABIENE Kobs, PharmD Clinical Pharmacist [facility] [phone] "

## 2024-12-23 NOTE — Telephone Encounter (Signed)
 Patient called back to follow up on this. Not sure if someone is able to help her.

## 2024-12-24 ENCOUNTER — Telehealth: Payer: Self-pay | Admitting: Family Medicine

## 2024-12-24 DIAGNOSIS — I4891 Unspecified atrial fibrillation: Secondary | ICD-10-CM

## 2024-12-24 DIAGNOSIS — K219 Gastro-esophageal reflux disease without esophagitis: Secondary | ICD-10-CM

## 2024-12-24 MED ORDER — TRULICITY 0.75 MG/0.5ML ~~LOC~~ SOAJ: 0.7500 mg | SUBCUTANEOUS | 0 refills | Status: AC

## 2024-12-24 MED ORDER — TRULICITY 1.5 MG/0.5ML ~~LOC~~ SOAJ: 1.5000 mg | SUBCUTANEOUS | 1 refills | Status: AC

## 2024-12-24 NOTE — Progress Notes (Signed)
 Signed form and placed in CMA box.

## 2024-12-24 NOTE — Telephone Encounter (Signed)
 Copied from CRM [id number]. Topic: General - Other >> Dec 23, 2024  4:02 PM Devin Huffman wrote: Reason for CRM: Devin Huffman is calling to speak to Devin Huffman, [facility] does not have Devin Huffman  so she would like to know if pcp can prescribe something else.   She also wants to send the rest of his medications to [facility], [phone].

## 2024-12-25 NOTE — Telephone Encounter (Signed)
 Preferred pharmacy updated in pt chart.  Please advise on alternate rx for trulicity 

## 2024-12-26 MED ORDER — DILTIAZEM HCL ER COATED BEADS 240 MG PO CP24: 240.0000 mg | ORAL_CAPSULE | Freq: Every day | ORAL | 3 refills | Status: AC

## 2024-12-26 MED ORDER — APIXABAN 5 MG PO TABS: 5.0000 mg | ORAL_TABLET | Freq: Two times a day (BID) | ORAL | 3 refills | Status: AC

## 2024-12-26 MED ORDER — METFORMIN HCL 500 MG PO TABS: 500.0000 mg | ORAL_TABLET | Freq: Two times a day (BID) | ORAL | 3 refills | Status: AC

## 2024-12-26 MED ORDER — LOSARTAN POTASSIUM 25 MG PO TABS: 25.0000 mg | ORAL_TABLET | Freq: Every day | ORAL | 3 refills | Status: AC

## 2024-12-26 MED ORDER — CARVEDILOL 6.25 MG PO TABS: 6.2500 mg | ORAL_TABLET | Freq: Two times a day (BID) | ORAL | 3 refills | Status: AC

## 2024-12-26 MED ORDER — FUROSEMIDE 20 MG PO TABS: 20.0000 mg | ORAL_TABLET | ORAL | 3 refills | Status: AC

## 2024-12-26 MED ORDER — ROSUVASTATIN CALCIUM 5 MG PO TABS: 5.0000 mg | ORAL_TABLET | Freq: Every day | ORAL | 3 refills | Status: AC

## 2024-12-26 NOTE — Addendum Note (Signed)
 Addended by: RILLA BALLER on: 12/26/2024 04:49 PM   Modules accepted: Orders

## 2024-12-26 NOTE — Telephone Encounter (Addendum)
 Spoke with wife Recommend Trulicity  through PAP - we are working on this (refilled earlier this week to [facility]).  Will refill other meds to [facility]

## 2024-12-29 ENCOUNTER — Telehealth: Payer: Self-pay

## 2024-12-29 NOTE — Telephone Encounter (Signed)
 Received provider portion [facility] faxed to [facility] per rph notes pt portion has been submitted online.

## 2025-03-17 ENCOUNTER — Ambulatory Visit: Admitting: Family Medicine

## 2025-08-13 ENCOUNTER — Ambulatory Visit

## 2025-08-14 ENCOUNTER — Ambulatory Visit
